# Patient Record
Sex: Female | Born: 1970 | Race: White | Hispanic: No | Smoking: Current every day smoker
Health system: Southern US, Community
[De-identification: ages and names within clinical notes are randomized; demographics above are authoritative.]

## PROBLEM LIST (undated history)

## (undated) DIAGNOSIS — E119 Type 2 diabetes mellitus without complications: Secondary | ICD-10-CM

## (undated) DIAGNOSIS — F419 Anxiety disorder, unspecified: Secondary | ICD-10-CM

## (undated) DIAGNOSIS — T4145XA Adverse effect of unspecified anesthetic, initial encounter: Secondary | ICD-10-CM

## (undated) DIAGNOSIS — I639 Cerebral infarction, unspecified: Secondary | ICD-10-CM

## (undated) DIAGNOSIS — K219 Gastro-esophageal reflux disease without esophagitis: Secondary | ICD-10-CM

## (undated) DIAGNOSIS — T8859XA Other complications of anesthesia, initial encounter: Secondary | ICD-10-CM

## (undated) DIAGNOSIS — Z8719 Personal history of other diseases of the digestive system: Secondary | ICD-10-CM

## (undated) DIAGNOSIS — K8020 Calculus of gallbladder without cholecystitis without obstruction: Secondary | ICD-10-CM

## (undated) DIAGNOSIS — G43909 Migraine, unspecified, not intractable, without status migrainosus: Secondary | ICD-10-CM

## (undated) DIAGNOSIS — D649 Anemia, unspecified: Secondary | ICD-10-CM

## (undated) DIAGNOSIS — G4730 Sleep apnea, unspecified: Secondary | ICD-10-CM

## (undated) DIAGNOSIS — Z8711 Personal history of peptic ulcer disease: Secondary | ICD-10-CM

## (undated) HISTORY — PX: DILATION AND CURETTAGE OF UTERUS: SHX78

## (undated) HISTORY — PX: ABDOMINAL EXPLORATION SURGERY: SHX538

---

## 1996-06-04 HISTORY — PX: LEEP: SHX91

## 1998-05-04 HISTORY — PX: TUBAL LIGATION: SHX77

## 1998-08-03 HISTORY — PX: ABDOMINAL HYSTERECTOMY: SHX81

## 2004-04-22 ENCOUNTER — Emergency Department: Payer: Self-pay | Admitting: General Practice

## 2006-01-22 ENCOUNTER — Ambulatory Visit: Payer: Self-pay | Admitting: Nurse Practitioner

## 2006-07-16 ENCOUNTER — Emergency Department (HOSPITAL_COMMUNITY): Admission: EM | Admit: 2006-07-16 | Discharge: 2006-07-16 | Payer: Self-pay | Admitting: Emergency Medicine

## 2006-08-12 ENCOUNTER — Ambulatory Visit: Payer: Self-pay | Admitting: Nurse Practitioner

## 2006-10-23 ENCOUNTER — Emergency Department (HOSPITAL_COMMUNITY): Admission: EM | Admit: 2006-10-23 | Discharge: 2006-10-23 | Payer: Self-pay | Admitting: Emergency Medicine

## 2006-12-16 ENCOUNTER — Emergency Department (HOSPITAL_COMMUNITY): Admission: EM | Admit: 2006-12-16 | Discharge: 2006-12-16 | Payer: Self-pay | Admitting: Emergency Medicine

## 2007-03-18 ENCOUNTER — Ambulatory Visit: Payer: Self-pay | Admitting: Unknown Physician Specialty

## 2007-03-20 ENCOUNTER — Ambulatory Visit: Payer: Self-pay | Admitting: Unknown Physician Specialty

## 2007-05-06 ENCOUNTER — Emergency Department (HOSPITAL_COMMUNITY): Admission: EM | Admit: 2007-05-06 | Discharge: 2007-05-06 | Payer: Self-pay | Admitting: Emergency Medicine

## 2007-05-12 ENCOUNTER — Ambulatory Visit: Payer: Self-pay | Admitting: *Deleted

## 2007-12-15 ENCOUNTER — Emergency Department (HOSPITAL_COMMUNITY): Admission: EM | Admit: 2007-12-15 | Discharge: 2007-12-15 | Payer: Self-pay | Admitting: Emergency Medicine

## 2009-01-16 ENCOUNTER — Emergency Department (HOSPITAL_COMMUNITY): Admission: EM | Admit: 2009-01-16 | Discharge: 2009-01-16 | Payer: Self-pay | Admitting: Emergency Medicine

## 2009-02-11 ENCOUNTER — Emergency Department (HOSPITAL_COMMUNITY): Admission: EM | Admit: 2009-02-11 | Discharge: 2009-02-11 | Payer: Self-pay | Admitting: Family Medicine

## 2009-04-06 ENCOUNTER — Emergency Department (HOSPITAL_COMMUNITY): Admission: EM | Admit: 2009-04-06 | Discharge: 2009-04-06 | Payer: Self-pay | Admitting: Family Medicine

## 2009-07-14 ENCOUNTER — Emergency Department (HOSPITAL_COMMUNITY): Admission: EM | Admit: 2009-07-14 | Discharge: 2009-07-14 | Payer: Self-pay | Admitting: Emergency Medicine

## 2009-07-29 ENCOUNTER — Emergency Department (HOSPITAL_COMMUNITY): Admission: EM | Admit: 2009-07-29 | Discharge: 2009-07-29 | Payer: Self-pay | Admitting: Emergency Medicine

## 2009-07-31 ENCOUNTER — Emergency Department (HOSPITAL_COMMUNITY): Admission: EM | Admit: 2009-07-31 | Discharge: 2009-07-31 | Payer: Self-pay | Admitting: Family Medicine

## 2009-09-20 ENCOUNTER — Emergency Department (HOSPITAL_COMMUNITY): Admission: EM | Admit: 2009-09-20 | Discharge: 2009-09-20 | Payer: Self-pay | Admitting: Family Medicine

## 2009-11-06 ENCOUNTER — Emergency Department (HOSPITAL_COMMUNITY): Admission: EM | Admit: 2009-11-06 | Discharge: 2009-11-06 | Payer: Self-pay | Admitting: Emergency Medicine

## 2010-04-19 ENCOUNTER — Emergency Department (HOSPITAL_COMMUNITY): Admission: EM | Admit: 2010-04-19 | Discharge: 2010-04-19 | Payer: Self-pay | Admitting: Emergency Medicine

## 2010-06-08 ENCOUNTER — Emergency Department (HOSPITAL_COMMUNITY)
Admission: EM | Admit: 2010-06-08 | Discharge: 2010-06-08 | Payer: Self-pay | Source: Home / Self Care | Admitting: Family Medicine

## 2010-06-08 LAB — POCT URINALYSIS DIPSTICK
Bilirubin Urine: NEGATIVE
Hemoglobin, Urine: NEGATIVE
Ketones, ur: NEGATIVE mg/dL
Nitrite: NEGATIVE
Protein, ur: NEGATIVE mg/dL
Specific Gravity, Urine: 1.015 (ref 1.005–1.030)
Urine Glucose, Fasting: >=1000 mg/dL — AB
Urobilinogen, UA: 0.2 mg/dL (ref 0.0–1.0)
pH: 5 (ref 5.0–8.0)

## 2010-06-08 LAB — WET PREP, GENITAL
Clue Cells Wet Prep HPF POC: NONE SEEN
Trich, Wet Prep: NONE SEEN
Yeast Wet Prep HPF POC: NONE SEEN

## 2010-06-08 LAB — GLUCOSE, CAPILLARY: Glucose-Capillary: 339 mg/dL — ABNORMAL HIGH (ref 70–99)

## 2010-06-09 LAB — GC/CHLAMYDIA PROBE AMP, GENITAL
Chlamydia, DNA Probe: NEGATIVE
GC Probe Amp, Genital: NEGATIVE

## 2010-07-16 ENCOUNTER — Inpatient Hospital Stay (INDEPENDENT_AMBULATORY_CARE_PROVIDER_SITE_OTHER)
Admission: RE | Admit: 2010-07-16 | Discharge: 2010-07-16 | Disposition: A | Discharge status: Home / Self Care | Payer: Self-pay | Source: Ambulatory Visit | Attending: Family Medicine | Admitting: Family Medicine

## 2010-07-16 DIAGNOSIS — J069 Acute upper respiratory infection, unspecified: Secondary | ICD-10-CM

## 2010-07-16 LAB — GLUCOSE, CAPILLARY: Glucose-Capillary: 320 mg/dL — ABNORMAL HIGH (ref 70–99)

## 2010-07-21 ENCOUNTER — Inpatient Hospital Stay (INDEPENDENT_AMBULATORY_CARE_PROVIDER_SITE_OTHER)
Admission: RE | Admit: 2010-07-21 | Discharge: 2010-07-21 | Disposition: A | Discharge status: Home / Self Care | Payer: Self-pay | Source: Ambulatory Visit

## 2010-07-21 DIAGNOSIS — L600 Ingrowing nail: Secondary | ICD-10-CM

## 2010-08-15 LAB — URINALYSIS, ROUTINE W REFLEX MICROSCOPIC
Glucose, UA: 500 mg/dL — AB
Hgb urine dipstick: NEGATIVE
Ketones, ur: 15 mg/dL — AB
Nitrite: NEGATIVE
Protein, ur: NEGATIVE mg/dL
Specific Gravity, Urine: 1.029 (ref 1.005–1.030)
Urobilinogen, UA: 0.2 mg/dL (ref 0.0–1.0)
pH: 6 (ref 5.0–8.0)

## 2010-08-15 LAB — COMPREHENSIVE METABOLIC PANEL
ALT: 19 U/L (ref 0–35)
AST: 17 U/L (ref 0–37)
Albumin: 3.9 g/dL (ref 3.5–5.2)
Alkaline Phosphatase: 65 U/L (ref 39–117)
BUN: 9 mg/dL (ref 6–23)
CO2: 27 mEq/L (ref 19–32)
Calcium: 9.8 mg/dL (ref 8.4–10.5)
Chloride: 101 mEq/L (ref 96–112)
Creatinine, Ser: 0.84 mg/dL (ref 0.4–1.2)
GFR calc Af Amer: >60 mL/min (ref >60)
GFR calc non Af Amer: >60 mL/min (ref >60)
Glucose, Bld: 197 mg/dL — ABNORMAL HIGH (ref 70–99)
Potassium: 3.9 mEq/L (ref 3.5–5.1)
Sodium: 137 mEq/L (ref 135–145)
Total Bilirubin: 0.3 mg/dL (ref 0.3–1.2)
Total Protein: 6.7 g/dL (ref 6.0–8.3)

## 2010-08-15 LAB — DIFFERENTIAL
Basophils Absolute: 0 10*3/uL (ref 0.0–0.1)
Basophils Relative: 0 % (ref 0–1)
Eosinophils Absolute: 0.3 10*3/uL (ref 0.0–0.7)
Eosinophils Relative: 3 % (ref 0–5)
Lymphocytes Relative: 28 % (ref 12–46)
Lymphs Abs: 2.6 10*3/uL (ref 0.7–4.0)
Monocytes Absolute: 0.5 10*3/uL (ref 0.1–1.0)
Monocytes Relative: 5 % (ref 3–12)
Neutro Abs: 6.1 10*3/uL (ref 1.7–7.7)
Neutrophils Relative %: 64 % (ref 43–77)

## 2010-08-15 LAB — CBC
HCT: 40.3 % (ref 36.0–46.0)
Hemoglobin: 14.4 g/dL (ref 12.0–15.0)
MCH: 31.9 pg (ref 26.0–34.0)
MCHC: 35.7 g/dL (ref 30.0–36.0)
MCV: 89.2 fL (ref 78.0–100.0)
Platelets: 332 10*3/uL (ref 150–400)
RBC: 4.52 MIL/uL (ref 3.87–5.11)
RDW: 13.1 % (ref 11.5–15.5)
WBC: 9.5 10*3/uL (ref 4.0–10.5)

## 2010-08-21 LAB — DIFFERENTIAL
Basophils Absolute: 0.1 10*3/uL (ref 0.0–0.1)
Basophils Relative: 0 % (ref 0–1)
Eosinophils Absolute: 0.2 10*3/uL (ref 0.0–0.7)
Eosinophils Relative: 1 % (ref 0–5)
Lymphocytes Relative: 9 % — ABNORMAL LOW (ref 12–46)
Lymphs Abs: 1.7 10*3/uL (ref 0.7–4.0)
Monocytes Absolute: 0.5 10*3/uL (ref 0.1–1.0)
Monocytes Relative: 3 % (ref 3–12)
Neutro Abs: 15.9 10*3/uL — ABNORMAL HIGH (ref 1.7–7.7)
Neutrophils Relative %: 87 % — ABNORMAL HIGH (ref 43–77)

## 2010-08-21 LAB — CBC
HCT: 46.1 % — ABNORMAL HIGH (ref 36.0–46.0)
Hemoglobin: 16.1 g/dL — ABNORMAL HIGH (ref 12.0–15.0)
MCHC: 35 g/dL (ref 30.0–36.0)
MCV: 93.2 fL (ref 78.0–100.0)
Platelets: 49 10*3/uL — ABNORMAL LOW (ref 150–400)
RBC: 4.94 MIL/uL (ref 3.87–5.11)
RDW: 13.9 % (ref 11.5–15.5)
WBC: 18.3 10*3/uL — ABNORMAL HIGH (ref 4.0–10.5)

## 2010-08-21 LAB — COMPREHENSIVE METABOLIC PANEL
ALT: 30 U/L (ref 0–35)
AST: 46 U/L — ABNORMAL HIGH (ref 0–37)
Albumin: 4.4 g/dL (ref 3.5–5.2)
Alkaline Phosphatase: 69 U/L (ref 39–117)
BUN: 11 mg/dL (ref 6–23)
CO2: 20 mEq/L (ref 19–32)
Calcium: 8.7 mg/dL (ref 8.4–10.5)
Chloride: 102 mEq/L (ref 96–112)
Creatinine, Ser: 0.69 mg/dL (ref 0.4–1.2)
GFR calc Af Amer: >60 mL/min (ref >60)
GFR calc non Af Amer: >60 mL/min (ref >60)
Glucose, Bld: 209 mg/dL — ABNORMAL HIGH (ref 70–99)
Potassium: 4.6 mEq/L (ref 3.5–5.1)
Sodium: 132 mEq/L — ABNORMAL LOW (ref 135–145)
Total Bilirubin: 2.1 mg/dL — ABNORMAL HIGH (ref 0.3–1.2)
Total Protein: 7 g/dL (ref 6.0–8.3)

## 2010-08-21 LAB — URINE MICROSCOPIC-ADD ON

## 2010-08-21 LAB — LIPASE, BLOOD: Lipase: 22 U/L (ref 11–59)

## 2010-08-21 LAB — URINALYSIS, ROUTINE W REFLEX MICROSCOPIC
Glucose, UA: NEGATIVE mg/dL
Hgb urine dipstick: NEGATIVE
Ketones, ur: NEGATIVE mg/dL
Leukocytes, UA: NEGATIVE
Nitrite: NEGATIVE
Protein, ur: 30 mg/dL — AB
Specific Gravity, Urine: 1.027 (ref 1.005–1.030)
Urobilinogen, UA: 0.2 mg/dL (ref 0.0–1.0)
pH: 5 (ref 5.0–8.0)

## 2010-08-23 LAB — CBC
HCT: 41 % (ref 36.0–46.0)
Hemoglobin: 13.9 g/dL (ref 12.0–15.0)
MCHC: 34 g/dL (ref 30.0–36.0)
MCV: 93.3 fL (ref 78.0–100.0)
Platelets: 285 10*3/uL (ref 150–400)
RBC: 4.39 MIL/uL (ref 3.87–5.11)
RDW: 14.5 % (ref 11.5–15.5)
WBC: 8.1 10*3/uL (ref 4.0–10.5)

## 2010-08-23 LAB — PREGNANCY, URINE: Preg Test, Ur: NEGATIVE

## 2010-08-23 LAB — POCT I-STAT, CHEM 8
BUN: 9 mg/dL (ref 6–23)
Calcium, Ion: 1.05 mmol/L — ABNORMAL LOW (ref 1.12–1.32)
Chloride: 105 mEq/L (ref 96–112)
Creatinine, Ser: <0.2 mg/dL — ABNORMAL LOW (ref 0.4–1.2)
Glucose, Bld: 281 mg/dL — ABNORMAL HIGH (ref 70–99)
HCT: 42 % (ref 36.0–46.0)
Hemoglobin: 14.3 g/dL (ref 12.0–15.0)
Potassium: 4 mEq/L (ref 3.5–5.1)
Sodium: 135 mEq/L (ref 135–145)
TCO2: 23 mmol/L (ref 0–100)

## 2010-08-23 LAB — DIFFERENTIAL
Basophils Absolute: 0 10*3/uL (ref 0.0–0.1)
Basophils Relative: 0 % (ref 0–1)
Lymphocytes Relative: 21 % (ref 12–46)
Monocytes Relative: 6 % (ref 3–12)
Neutro Abs: 5.7 10*3/uL (ref 1.7–7.7)
Neutrophils Relative %: 70 % (ref 43–77)

## 2010-08-23 LAB — HEMOCCULT GUIAC POC 1CARD (OFFICE): Fecal Occult Bld: POSITIVE

## 2010-09-02 ENCOUNTER — Inpatient Hospital Stay (INDEPENDENT_AMBULATORY_CARE_PROVIDER_SITE_OTHER)
Admission: RE | Admit: 2010-09-02 | Discharge: 2010-09-02 | Disposition: A | Discharge status: Home / Self Care | Payer: Self-pay | Source: Ambulatory Visit | Attending: Emergency Medicine | Admitting: Emergency Medicine

## 2010-09-02 DIAGNOSIS — R05 Cough: Secondary | ICD-10-CM

## 2010-09-02 DIAGNOSIS — J019 Acute sinusitis, unspecified: Secondary | ICD-10-CM

## 2010-09-02 DIAGNOSIS — R059 Cough, unspecified: Secondary | ICD-10-CM

## 2010-09-08 LAB — POCT URINALYSIS DIP (DEVICE)
Glucose, UA: NEGATIVE mg/dL
Hgb urine dipstick: NEGATIVE
Nitrite: NEGATIVE

## 2010-09-26 ENCOUNTER — Emergency Department (HOSPITAL_COMMUNITY): Payer: Self-pay

## 2010-09-26 ENCOUNTER — Emergency Department (HOSPITAL_COMMUNITY)
Admission: EM | Admit: 2010-09-26 | Discharge: 2010-09-26 | Disposition: A | Discharge status: Home / Self Care | Payer: Self-pay | Attending: Emergency Medicine | Admitting: Emergency Medicine

## 2010-09-26 DIAGNOSIS — K8020 Calculus of gallbladder without cholecystitis without obstruction: Secondary | ICD-10-CM | POA: Insufficient documentation

## 2010-09-26 DIAGNOSIS — R630 Anorexia: Secondary | ICD-10-CM | POA: Insufficient documentation

## 2010-09-26 DIAGNOSIS — E7800 Pure hypercholesterolemia, unspecified: Secondary | ICD-10-CM | POA: Insufficient documentation

## 2010-09-26 DIAGNOSIS — G8929 Other chronic pain: Secondary | ICD-10-CM | POA: Insufficient documentation

## 2010-09-26 DIAGNOSIS — M549 Dorsalgia, unspecified: Secondary | ICD-10-CM | POA: Insufficient documentation

## 2010-09-26 DIAGNOSIS — Z79899 Other long term (current) drug therapy: Secondary | ICD-10-CM | POA: Insufficient documentation

## 2010-09-26 DIAGNOSIS — E119 Type 2 diabetes mellitus without complications: Secondary | ICD-10-CM | POA: Insufficient documentation

## 2010-09-26 DIAGNOSIS — R1013 Epigastric pain: Secondary | ICD-10-CM | POA: Insufficient documentation

## 2010-09-26 DIAGNOSIS — Z794 Long term (current) use of insulin: Secondary | ICD-10-CM | POA: Insufficient documentation

## 2010-09-26 DIAGNOSIS — R110 Nausea: Secondary | ICD-10-CM | POA: Insufficient documentation

## 2010-09-26 LAB — DIFFERENTIAL
Basophils Absolute: 0.1 10*3/uL (ref 0.0–0.1)
Basophils Relative: 1 % (ref 0–1)
Neutro Abs: 6.5 10*3/uL (ref 1.7–7.7)
Neutrophils Relative %: 66 % (ref 43–77)

## 2010-09-26 LAB — COMPREHENSIVE METABOLIC PANEL
Alkaline Phosphatase: 66 U/L (ref 39–117)
BUN: 8 mg/dL (ref 6–23)
GFR calc non Af Amer: >60 mL/min (ref >60)
Glucose, Bld: 317 mg/dL — ABNORMAL HIGH (ref 70–99)
Potassium: 4 mEq/L (ref 3.5–5.1)
Total Protein: 6.2 g/dL (ref 6.0–8.3)

## 2010-09-26 LAB — URINALYSIS, ROUTINE W REFLEX MICROSCOPIC
Glucose, UA: >1000 mg/dL — AB
Leukocytes, UA: NEGATIVE
Protein, ur: NEGATIVE mg/dL
pH: 5 (ref 5.0–8.0)

## 2010-09-26 LAB — CBC
Hemoglobin: 15.2 g/dL — ABNORMAL HIGH (ref 12.0–15.0)
RBC: 4.71 MIL/uL (ref 3.87–5.11)

## 2010-09-26 LAB — URINE MICROSCOPIC-ADD ON

## 2010-09-26 LAB — GLUCOSE, CAPILLARY: Glucose-Capillary: 233 mg/dL — ABNORMAL HIGH (ref 70–99)

## 2010-09-26 LAB — LIPASE, BLOOD: Lipase: 36 U/L (ref 11–59)

## 2010-12-12 ENCOUNTER — Emergency Department (HOSPITAL_COMMUNITY)
Admission: EM | Admit: 2010-12-12 | Discharge: 2010-12-12 | Disposition: A | Discharge status: Home / Self Care | Payer: Self-pay | Attending: Emergency Medicine | Admitting: Emergency Medicine

## 2010-12-12 DIAGNOSIS — N9489 Other specified conditions associated with female genital organs and menstrual cycle: Secondary | ICD-10-CM | POA: Insufficient documentation

## 2010-12-12 DIAGNOSIS — N764 Abscess of vulva: Secondary | ICD-10-CM | POA: Insufficient documentation

## 2010-12-12 DIAGNOSIS — E119 Type 2 diabetes mellitus without complications: Secondary | ICD-10-CM | POA: Insufficient documentation

## 2010-12-12 DIAGNOSIS — E7800 Pure hypercholesterolemia, unspecified: Secondary | ICD-10-CM | POA: Insufficient documentation

## 2010-12-12 DIAGNOSIS — N949 Unspecified condition associated with female genital organs and menstrual cycle: Secondary | ICD-10-CM | POA: Insufficient documentation

## 2010-12-12 DIAGNOSIS — Z794 Long term (current) use of insulin: Secondary | ICD-10-CM | POA: Insufficient documentation

## 2010-12-12 DIAGNOSIS — Z79899 Other long term (current) drug therapy: Secondary | ICD-10-CM | POA: Insufficient documentation

## 2010-12-12 DIAGNOSIS — M549 Dorsalgia, unspecified: Secondary | ICD-10-CM | POA: Insufficient documentation

## 2010-12-12 DIAGNOSIS — G8929 Other chronic pain: Secondary | ICD-10-CM | POA: Insufficient documentation

## 2010-12-14 LAB — CULTURE, ROUTINE-ABSCESS

## 2011-03-18 ENCOUNTER — Emergency Department (HOSPITAL_COMMUNITY): Payer: Self-pay

## 2011-03-18 ENCOUNTER — Emergency Department (HOSPITAL_COMMUNITY)
Admission: EM | Admit: 2011-03-18 | Discharge: 2011-03-18 | Disposition: A | Discharge status: Home / Self Care | Payer: Self-pay | Attending: Emergency Medicine | Admitting: Emergency Medicine

## 2011-03-18 DIAGNOSIS — F411 Generalized anxiety disorder: Secondary | ICD-10-CM | POA: Insufficient documentation

## 2011-03-18 DIAGNOSIS — F17200 Nicotine dependence, unspecified, uncomplicated: Secondary | ICD-10-CM | POA: Insufficient documentation

## 2011-03-18 DIAGNOSIS — R6883 Chills (without fever): Secondary | ICD-10-CM | POA: Insufficient documentation

## 2011-03-18 DIAGNOSIS — R197 Diarrhea, unspecified: Secondary | ICD-10-CM | POA: Insufficient documentation

## 2011-03-18 DIAGNOSIS — E119 Type 2 diabetes mellitus without complications: Secondary | ICD-10-CM | POA: Insufficient documentation

## 2011-03-18 DIAGNOSIS — K921 Melena: Secondary | ICD-10-CM | POA: Insufficient documentation

## 2011-03-18 DIAGNOSIS — R062 Wheezing: Secondary | ICD-10-CM | POA: Insufficient documentation

## 2011-03-18 DIAGNOSIS — E789 Disorder of lipoprotein metabolism, unspecified: Secondary | ICD-10-CM | POA: Insufficient documentation

## 2011-03-18 DIAGNOSIS — Z79899 Other long term (current) drug therapy: Secondary | ICD-10-CM | POA: Insufficient documentation

## 2011-03-18 DIAGNOSIS — Z794 Long term (current) use of insulin: Secondary | ICD-10-CM | POA: Insufficient documentation

## 2011-03-18 DIAGNOSIS — R112 Nausea with vomiting, unspecified: Secondary | ICD-10-CM | POA: Insufficient documentation

## 2011-03-18 DIAGNOSIS — R61 Generalized hyperhidrosis: Secondary | ICD-10-CM | POA: Insufficient documentation

## 2011-03-18 DIAGNOSIS — R109 Unspecified abdominal pain: Secondary | ICD-10-CM | POA: Insufficient documentation

## 2011-03-18 LAB — DIFFERENTIAL
Basophils Relative: 0 % (ref 0–1)
Lymphocytes Relative: 16 % (ref 12–46)
Lymphs Abs: 2 10*3/uL (ref 0.7–4.0)
Monocytes Absolute: 0.8 10*3/uL (ref 0.1–1.0)
Monocytes Relative: 6 % (ref 3–12)
Neutro Abs: 9.3 10*3/uL — ABNORMAL HIGH (ref 1.7–7.7)

## 2011-03-18 LAB — CBC
HCT: 46.8 % — ABNORMAL HIGH (ref 36.0–46.0)
Hemoglobin: 17 g/dL — ABNORMAL HIGH (ref 12.0–15.0)
MCH: 32.6 pg (ref 26.0–34.0)
MCHC: 36.3 g/dL — ABNORMAL HIGH (ref 30.0–36.0)
MCV: 89.7 fL (ref 78.0–100.0)

## 2011-03-18 LAB — URINALYSIS, ROUTINE W REFLEX MICROSCOPIC
Bilirubin Urine: NEGATIVE
Glucose, UA: 500 mg/dL — AB
Hgb urine dipstick: NEGATIVE
Ketones, ur: NEGATIVE mg/dL
Protein, ur: NEGATIVE mg/dL
pH: 5 (ref 5.0–8.0)

## 2011-03-18 LAB — COMPREHENSIVE METABOLIC PANEL
CO2: 24 mEq/L (ref 19–32)
Calcium: 9.9 mg/dL (ref 8.4–10.5)
Chloride: 98 mEq/L (ref 96–112)
Creatinine, Ser: 0.65 mg/dL (ref 0.50–1.10)
GFR calc Af Amer: >90 mL/min (ref >90)
GFR calc non Af Amer: >90 mL/min (ref >90)
Glucose, Bld: 222 mg/dL — ABNORMAL HIGH (ref 70–99)
Total Bilirubin: 0.6 mg/dL (ref 0.3–1.2)

## 2011-03-18 LAB — GLUCOSE, CAPILLARY: Glucose-Capillary: 170 mg/dL — ABNORMAL HIGH (ref 70–99)

## 2011-03-18 LAB — OCCULT BLOOD, POC DEVICE: Fecal Occult Bld: POSITIVE

## 2011-03-18 MED ORDER — IOHEXOL 300 MG/ML  SOLN
80.0000 mL | Freq: Once | INTRAMUSCULAR | Status: AC | PRN
  Administered 2011-03-18: 80 mL via INTRAVENOUS

## 2011-03-20 LAB — CBC
HCT: 47.1 — ABNORMAL HIGH
Hemoglobin: 16.1 — ABNORMAL HIGH
MCHC: 34.2
Platelets: 411 — ABNORMAL HIGH
RDW: 13.2

## 2011-03-20 LAB — COMPREHENSIVE METABOLIC PANEL
Albumin: 4.9
BUN: 10
Creatinine, Ser: 0.75
Total Bilirubin: 1.1
Total Protein: 8.4 — ABNORMAL HIGH

## 2011-03-20 LAB — DIFFERENTIAL
Basophils Absolute: 0
Lymphocytes Relative: 16
Monocytes Absolute: 0.7
Monocytes Relative: 5
Neutro Abs: 10.6 — ABNORMAL HIGH
Neutrophils Relative %: 76

## 2011-03-20 LAB — URINALYSIS, ROUTINE W REFLEX MICROSCOPIC
Hgb urine dipstick: NEGATIVE
Specific Gravity, Urine: 1.009
Urobilinogen, UA: 0.2

## 2012-01-03 ENCOUNTER — Encounter (HOSPITAL_COMMUNITY): Payer: Self-pay | Admitting: General Practice

## 2012-01-03 ENCOUNTER — Ambulatory Visit (HOSPITAL_COMMUNITY)
Admission: EM | Admit: 2012-01-03 | Discharge: 2012-01-04 | Disposition: A | Discharge status: Home / Self Care | Payer: Managed Care, Other (non HMO) | Attending: General Surgery | Admitting: General Surgery

## 2012-01-03 ENCOUNTER — Inpatient Hospital Stay: Admit: 2012-01-03 | Payer: Self-pay | Admitting: General Surgery

## 2012-01-03 ENCOUNTER — Encounter (HOSPITAL_COMMUNITY)
Admission: EM | Disposition: A | Discharge status: Home / Self Care | Payer: Self-pay | Source: Home / Self Care | Attending: General Surgery

## 2012-01-03 ENCOUNTER — Encounter (HOSPITAL_COMMUNITY): Payer: Self-pay | Admitting: *Deleted

## 2012-01-03 ENCOUNTER — Emergency Department (HOSPITAL_COMMUNITY): Payer: Managed Care, Other (non HMO) | Admitting: Anesthesiology

## 2012-01-03 ENCOUNTER — Emergency Department (HOSPITAL_COMMUNITY): Payer: Managed Care, Other (non HMO)

## 2012-01-03 ENCOUNTER — Encounter (HOSPITAL_COMMUNITY): Payer: Self-pay | Admitting: Anesthesiology

## 2012-01-03 DIAGNOSIS — K3580 Unspecified acute appendicitis: Secondary | ICD-10-CM

## 2012-01-03 DIAGNOSIS — E876 Hypokalemia: Secondary | ICD-10-CM

## 2012-01-03 DIAGNOSIS — F411 Generalized anxiety disorder: Secondary | ICD-10-CM | POA: Insufficient documentation

## 2012-01-03 DIAGNOSIS — E119 Type 2 diabetes mellitus without complications: Secondary | ICD-10-CM | POA: Insufficient documentation

## 2012-01-03 DIAGNOSIS — F17200 Nicotine dependence, unspecified, uncomplicated: Secondary | ICD-10-CM | POA: Insufficient documentation

## 2012-01-03 DIAGNOSIS — K8020 Calculus of gallbladder without cholecystitis without obstruction: Secondary | ICD-10-CM

## 2012-01-03 HISTORY — DX: Personal history of other diseases of the digestive system: Z87.19

## 2012-01-03 HISTORY — DX: Gastro-esophageal reflux disease without esophagitis: K21.9

## 2012-01-03 HISTORY — DX: Anemia, unspecified: D64.9

## 2012-01-03 HISTORY — DX: Personal history of peptic ulcer disease: Z87.11

## 2012-01-03 HISTORY — DX: Adverse effect of unspecified anesthetic, initial encounter: T41.45XA

## 2012-01-03 HISTORY — DX: Calculus of gallbladder without cholecystitis without obstruction: K80.20

## 2012-01-03 HISTORY — DX: Other complications of anesthesia, initial encounter: T88.59XA

## 2012-01-03 HISTORY — DX: Migraine, unspecified, not intractable, without status migrainosus: G43.909

## 2012-01-03 HISTORY — DX: Sleep apnea, unspecified: G47.30

## 2012-01-03 HISTORY — DX: Type 2 diabetes mellitus without complications: E11.9

## 2012-01-03 HISTORY — PX: LAPAROSCOPIC APPENDECTOMY: SHX408

## 2012-01-03 HISTORY — DX: Anxiety disorder, unspecified: F41.9

## 2012-01-03 HISTORY — PX: APPENDECTOMY: SHX54

## 2012-01-03 LAB — CBC WITH DIFFERENTIAL/PLATELET
Basophils Absolute: 0.1 10*3/uL (ref 0.0–0.1)
Eosinophils Relative: 2 % (ref 0–5)
Lymphocytes Relative: 9 % — ABNORMAL LOW (ref 12–46)
Lymphs Abs: 2 10*3/uL (ref 0.7–4.0)
MCV: 90.5 fL (ref 78.0–100.0)
Neutro Abs: 19.5 10*3/uL — ABNORMAL HIGH (ref 1.7–7.7)
Neutrophils Relative %: 84 % — ABNORMAL HIGH (ref 43–77)
Platelets: 389 10*3/uL (ref 150–400)
RBC: 4.65 MIL/uL (ref 3.87–5.11)
RDW: 14 % (ref 11.5–15.5)
WBC: 23.2 10*3/uL — ABNORMAL HIGH (ref 4.0–10.5)

## 2012-01-03 LAB — LIPASE, BLOOD: Lipase: 31 U/L (ref 11–59)

## 2012-01-03 LAB — COMPREHENSIVE METABOLIC PANEL
ALT: 19 U/L (ref 0–35)
AST: 13 U/L (ref 0–37)
Alkaline Phosphatase: 85 U/L (ref 39–117)
CO2: 24 mEq/L (ref 19–32)
Chloride: 96 mEq/L (ref 96–112)
GFR calc Af Amer: >90 mL/min (ref >90)
GFR calc non Af Amer: >90 mL/min (ref >90)
Glucose, Bld: 318 mg/dL — ABNORMAL HIGH (ref 70–99)
Potassium: 3 mEq/L — ABNORMAL LOW (ref 3.5–5.1)
Sodium: 134 mEq/L — ABNORMAL LOW (ref 135–145)
Total Bilirubin: 0.5 mg/dL (ref 0.3–1.2)

## 2012-01-03 LAB — POCT I-STAT, CHEM 8
Calcium, Ion: 1.13 mmol/L (ref 1.12–1.23)
Glucose, Bld: 311 mg/dL — ABNORMAL HIGH (ref 70–99)
HCT: 47 % — ABNORMAL HIGH (ref 36.0–46.0)
TCO2: 21 mmol/L (ref 0–100)

## 2012-01-03 LAB — GLUCOSE, CAPILLARY
Glucose-Capillary: 220 mg/dL — ABNORMAL HIGH (ref 70–99)
Glucose-Capillary: 170 mg/dL — ABNORMAL HIGH (ref 70–99)

## 2012-01-03 SURGERY — APPENDECTOMY, LAPAROSCOPIC
Anesthesia: General | Wound class: Contaminated

## 2012-01-03 MED ORDER — POTASSIUM CHLORIDE IN NACL 20-0.9 MEQ/L-% IV SOLN
INTRAVENOUS | Status: DC
  Administered 2012-01-03: 23:00:00 via INTRAVENOUS
  Filled 2012-01-03 (×4): qty 1000

## 2012-01-03 MED ORDER — PROPOFOL 10 MG/ML IV EMUL
INTRAVENOUS | Status: DC | PRN
  Administered 2012-01-03: 250 mg via INTRAVENOUS

## 2012-01-03 MED ORDER — NEOSTIGMINE METHYLSULFATE 1 MG/ML IJ SOLN
INTRAMUSCULAR | Status: DC | PRN
  Administered 2012-01-03: 3 mg via INTRAVENOUS

## 2012-01-03 MED ORDER — ONDANSETRON HCL 4 MG/2ML IJ SOLN
INTRAMUSCULAR | Status: DC | PRN
  Administered 2012-01-03: 4 mg via INTRAVENOUS

## 2012-01-03 MED ORDER — ONDANSETRON HCL 4 MG/2ML IJ SOLN
4.0000 mg | Freq: Once | INTRAMUSCULAR | Status: AC
  Administered 2012-01-03: 4 mg via INTRAVENOUS
  Filled 2012-01-03: qty 2

## 2012-01-03 MED ORDER — SODIUM CHLORIDE 0.9 % IV BOLUS (SEPSIS)
1000.0000 mL | Freq: Once | INTRAVENOUS | Status: AC
  Administered 2012-01-03: 1000 mL via INTRAVENOUS

## 2012-01-03 MED ORDER — IOHEXOL 300 MG/ML  SOLN
80.0000 mL | Freq: Once | INTRAMUSCULAR | Status: AC | PRN
  Administered 2012-01-03: 80 mL via INTRAVENOUS

## 2012-01-03 MED ORDER — ONDANSETRON HCL 4 MG/2ML IJ SOLN: 4.0000 mg | Freq: Once | INTRAMUSCULAR | Status: DC | PRN

## 2012-01-03 MED ORDER — LACTATED RINGERS IV SOLN
INTRAVENOUS | Status: DC | PRN
  Administered 2012-01-03: 14:00:00 via INTRAVENOUS

## 2012-01-03 MED ORDER — HEPARIN SODIUM (PORCINE) 5000 UNIT/ML IJ SOLN
5000.0000 [IU] | Freq: Three times a day (TID) | INTRAMUSCULAR | Status: DC
  Administered 2012-01-03 – 2012-01-04 (×2): 5000 [IU] via SUBCUTANEOUS
  Filled 2012-01-03 (×5): qty 1

## 2012-01-03 MED ORDER — MORPHINE SULFATE 4 MG/ML IJ SOLN
4.0000 mg | Freq: Once | INTRAMUSCULAR | Status: AC
  Administered 2012-01-03: 4 mg via INTRAVENOUS
  Filled 2012-01-03: qty 1

## 2012-01-03 MED ORDER — FENTANYL CITRATE 0.05 MG/ML IJ SOLN
INTRAMUSCULAR | Status: DC | PRN
  Administered 2012-01-03: 50 ug via INTRAVENOUS
  Administered 2012-01-03 (×2): 150 ug via INTRAVENOUS
  Administered 2012-01-03: 50 ug via INTRAVENOUS

## 2012-01-03 MED ORDER — HYDROMORPHONE HCL PF 1 MG/ML IJ SOLN
0.2500 mg | INTRAMUSCULAR | Status: DC | PRN
  Administered 2012-01-03 (×2): 0.5 mg via INTRAVENOUS

## 2012-01-03 MED ORDER — MORPHINE SULFATE 2 MG/ML IJ SOLN
2.0000 mg | INTRAMUSCULAR | Status: DC | PRN
  Administered 2012-01-03: 2 mg via INTRAVENOUS
  Administered 2012-01-03 – 2012-01-04 (×3): 4 mg via INTRAVENOUS
  Filled 2012-01-03: qty 1
  Filled 2012-01-03 (×3): qty 2

## 2012-01-03 MED ORDER — SODIUM CHLORIDE 0.9 % IV SOLN
1.0000 g | INTRAVENOUS | Status: DC
  Filled 2012-01-03 (×2): qty 1

## 2012-01-03 MED ORDER — INSULIN ASPART 100 UNIT/ML ~~LOC~~ SOLN
0.0000 [IU] | Freq: Three times a day (TID) | SUBCUTANEOUS | Status: DC
  Administered 2012-01-03: 5 [IU] via SUBCUTANEOUS
  Administered 2012-01-04: 8 [IU] via SUBCUTANEOUS

## 2012-01-03 MED ORDER — SODIUM CHLORIDE 0.9 % IR SOLN
Status: DC | PRN
  Administered 2012-01-03: 1000 mL

## 2012-01-03 MED ORDER — GLYCOPYRROLATE 0.2 MG/ML IJ SOLN
INTRAMUSCULAR | Status: DC | PRN
  Administered 2012-01-03: 0.4 mg via INTRAVENOUS

## 2012-01-03 MED ORDER — PHENYLEPHRINE HCL 10 MG/ML IJ SOLN
10.0000 mg | INTRAVENOUS | Status: DC | PRN
  Administered 2012-01-03: 20 ug/min via INTRAVENOUS

## 2012-01-03 MED ORDER — BUPIVACAINE-EPINEPHRINE 0.5% -1:200000 IJ SOLN
INTRAMUSCULAR | Status: DC | PRN
  Administered 2012-01-03: 16 mL

## 2012-01-03 MED ORDER — OXYMETAZOLINE HCL 0.05 % NA SOLN
2.0000 | Freq: Two times a day (BID) | NASAL | Status: DC | PRN
  Filled 2012-01-03: qty 15

## 2012-01-03 MED ORDER — PIOGLITAZONE HCL 30 MG PO TABS
30.0000 mg | ORAL_TABLET | Freq: Every day | ORAL | Status: DC
  Administered 2012-01-04: 30 mg via ORAL
  Filled 2012-01-03 (×2): qty 1

## 2012-01-03 MED ORDER — MIDAZOLAM HCL 5 MG/5ML IJ SOLN
INTRAMUSCULAR | Status: DC | PRN
  Administered 2012-01-03: 2 mg via INTRAVENOUS

## 2012-01-03 MED ORDER — SODIUM CHLORIDE 0.9 % IV SOLN
1.0000 g | INTRAVENOUS | Status: DC
  Filled 2012-01-03: qty 1

## 2012-01-03 MED ORDER — 0.9 % SODIUM CHLORIDE (POUR BTL) OPTIME
TOPICAL | Status: DC | PRN
  Administered 2012-01-03: 1000 mL

## 2012-01-03 MED ORDER — HYDROMORPHONE HCL PF 1 MG/ML IJ SOLN
1.0000 mg | Freq: Once | INTRAMUSCULAR | Status: AC
  Administered 2012-01-03: 1 mg via INTRAVENOUS
  Filled 2012-01-03: qty 1

## 2012-01-03 MED ORDER — LIDOCAINE HCL (CARDIAC) 20 MG/ML IV SOLN
INTRAVENOUS | Status: DC | PRN
  Administered 2012-01-03: 100 mg via INTRAVENOUS

## 2012-01-03 MED ORDER — LORAZEPAM 0.5 MG PO TABS: 0.5000 mg | ORAL_TABLET | ORAL | Status: DC | PRN

## 2012-01-03 MED ORDER — POTASSIUM CHLORIDE 10 MEQ/100ML IV SOLN
10.0000 meq | Freq: Once | INTRAVENOUS | Status: AC
  Administered 2012-01-03: 10 meq via INTRAVENOUS
  Filled 2012-01-03: qty 100

## 2012-01-03 MED ORDER — FUROSEMIDE 40 MG PO TABS
40.0000 mg | ORAL_TABLET | Freq: Every day | ORAL | Status: DC
  Administered 2012-01-03 – 2012-01-04 (×2): 40 mg via ORAL
  Filled 2012-01-03 (×2): qty 1

## 2012-01-03 MED ORDER — ROCURONIUM BROMIDE 100 MG/10ML IV SOLN
INTRAVENOUS | Status: DC | PRN
  Administered 2012-01-03: 35 mg via INTRAVENOUS

## 2012-01-03 MED ORDER — SODIUM CHLORIDE 0.9 % IV SOLN
INTRAVENOUS | Status: DC | PRN
  Administered 2012-01-03: 15:00:00 via INTRAVENOUS

## 2012-01-03 MED ORDER — OXYCODONE-ACETAMINOPHEN 5-325 MG PO TABS: 1.0000 | ORAL_TABLET | ORAL | Status: DC | PRN

## 2012-01-03 SURGICAL SUPPLY — 42 items
ADH SKN CLS APL DERMABOND .7 (GAUZE/BANDAGES/DRESSINGS) ×1
APPLIER CLIP ROT 10 11.4 M/L (STAPLE)
APR CLP MED LRG 11.4X10 (STAPLE)
BAG SPEC RTRVL LRG 6X4 10 (ENDOMECHANICALS) ×1
BLADE SURG ROTATE 9660 (MISCELLANEOUS) IMPLANT
CANISTER SUCTION 2500CC (MISCELLANEOUS) ×2 IMPLANT
CHLORAPREP W/TINT 26ML (MISCELLANEOUS) ×2 IMPLANT
CLIP APPLIE ROT 10 11.4 M/L (STAPLE) IMPLANT
CLOTH BEACON ORANGE TIMEOUT ST (SAFETY) ×2 IMPLANT
COVER SURGICAL LIGHT HANDLE (MISCELLANEOUS) ×2 IMPLANT
CUTTER FLEX LINEAR 45M (STAPLE) ×2 IMPLANT
DERMABOND ADVANCED (GAUZE/BANDAGES/DRESSINGS) ×1
DERMABOND ADVANCED .7 DNX12 (GAUZE/BANDAGES/DRESSINGS) ×1 IMPLANT
DRAPE UTILITY 15X26 W/TAPE STR (DRAPE) ×4 IMPLANT
ELECT REM PT RETURN 9FT ADLT (ELECTROSURGICAL) ×2
ELECTRODE REM PT RTRN 9FT ADLT (ELECTROSURGICAL) ×1 IMPLANT
ENDOLOOP SUT PDS II  0 18 (SUTURE)
ENDOLOOP SUT PDS II 0 18 (SUTURE) IMPLANT
GLOVE BIOGEL PI IND STRL 8 (GLOVE) ×1 IMPLANT
GLOVE BIOGEL PI INDICATOR 8 (GLOVE) ×1
GLOVE SURG SS PI 7.0 STRL IVOR (GLOVE) ×1 IMPLANT
GLOVE SURG SS PI 7.5 STRL IVOR (GLOVE) ×2 IMPLANT
GOWN STRL NON-REIN LRG LVL3 (GOWN DISPOSABLE) ×4 IMPLANT
GOWN STRL REIN XL XLG (GOWN DISPOSABLE) ×2 IMPLANT
KIT BASIN OR (CUSTOM PROCEDURE TRAY) ×2 IMPLANT
KIT ROOM TURNOVER OR (KITS) ×2 IMPLANT
NS IRRIG 1000ML POUR BTL (IV SOLUTION) ×2 IMPLANT
PAD ARMBOARD 7.5X6 YLW CONV (MISCELLANEOUS) ×4 IMPLANT
POUCH SPECIMEN RETRIEVAL 10MM (ENDOMECHANICALS) ×2 IMPLANT
RELOAD STAPLE 45 3.5 BLU ETS (ENDOMECHANICALS) ×1 IMPLANT
RELOAD STAPLE TA45 3.5 REG BLU (ENDOMECHANICALS) ×2 IMPLANT
SCALPEL HARMONIC ACE (MISCELLANEOUS) ×2 IMPLANT
SET IRRIG TUBING LAPAROSCOPIC (IRRIGATION / IRRIGATOR) ×2 IMPLANT
SUT MON AB 5-0 P3 18 (SUTURE) ×2 IMPLANT
TOWEL OR 17X24 6PK STRL BLUE (TOWEL DISPOSABLE) ×2 IMPLANT
TOWEL OR 17X26 10 PK STRL BLUE (TOWEL DISPOSABLE) ×2 IMPLANT
TRAY FOLEY BAG SILVER LF 14FR (CATHETERS) ×1 IMPLANT
TRAY LAPAROSCOPIC (CUSTOM PROCEDURE TRAY) ×2 IMPLANT
TROCAR XCEL BLUNT TIP 100MML (ENDOMECHANICALS) ×2 IMPLANT
TROCAR Z-THREAD FIOS 12X100MM (TROCAR) ×1 IMPLANT
TROCAR Z-THREAD FIOS 5X100MM (TROCAR) ×2 IMPLANT
WATER STERILE IRR 1000ML POUR (IV SOLUTION) IMPLANT

## 2012-01-03 NOTE — Preoperative (Signed)
Beta Blockers   Reason not to administer Beta Blockers:Not Applicable 

## 2012-01-03 NOTE — ED Provider Notes (Signed)
Assumed patient care end of shift. 41 year old female with history of gallstones presents with upper abdominal pain which radiates down to her right lower quadrant. She was found to have an elevated white count of 23. She also has an elevated blood sugar in the high 200s. Patient was getting IV fluid. Her LFTs are normal. Abdominal ultrasound shows evidence of cholelithiasis without evidence of cholecystitis. An abdominal CT scan ordered for further evaluation.  On exam, pt appears drowsy.  Heart RRR, S1S2, lung CTAB, abd soft, RLQ TTP, no rebound tenderness, negative Murphy's sign.   8:54 AM Request additional pain medication.  Is ready for CT scan  10:13 AM WBC 23.2 with left shift.  CT shows evidence of acute suppurative appendicitis. No focal fluid collection or free air to suggest perforation.  Will consult surgery for further care.  Pt made NPO.  I have consult with general surgery, who agrees to see the patient in the ED for further management.  Results for orders placed during the hospital encounter of 01/03/12  CBC WITH DIFFERENTIAL      Component Value Range   WBC 23.2 (*) 4.0 - 10.5 K/uL   RBC 4.65  3.87 - 5.11 MIL/uL   Hemoglobin 15.4 (*) 12.0 - 15.0 g/dL   HCT 41.3  24.4 - 01.0 %   MCV 90.5  78.0 - 100.0 fL   MCH 33.1  26.0 - 34.0 pg   MCHC 36.6 (*) 30.0 - 36.0 g/dL   RDW 27.2  53.6 - 64.4 %   Platelets 389  150 - 400 K/uL   Neutrophils Relative 84 (*) 43 - 77 %   Neutro Abs 19.5 (*) 1.7 - 7.7 K/uL   Lymphocytes Relative 9 (*) 12 - 46 %   Lymphs Abs 2.0  0.7 - 4.0 K/uL   Monocytes Relative 6  3 - 12 %   Monocytes Absolute 1.3 (*) 0.1 - 1.0 K/uL   Eosinophils Relative 2  0 - 5 %   Eosinophils Absolute 0.4  0.0 - 0.7 K/uL   Basophils Relative 0  0 - 1 %   Basophils Absolute 0.1  0.0 - 0.1 K/uL  COMPREHENSIVE METABOLIC PANEL      Component Value Range   Sodium 134 (*) 135 - 145 mEq/L   Potassium 3.0 (*) 3.5 - 5.1 mEq/L   Chloride 96  96 - 112 mEq/L   CO2 24  19 - 32  mEq/L   Glucose, Bld 318 (*) 70 - 99 mg/dL   BUN 9  6 - 23 mg/dL   Creatinine, Ser 0.34  0.50 - 1.10 mg/dL   Calcium 9.2  8.4 - 74.2 mg/dL   Total Protein 7.1  6.0 - 8.3 g/dL   Albumin 4.3  3.5 - 5.2 g/dL   AST 13  0 - 37 U/L   ALT 19  0 - 35 U/L   Alkaline Phosphatase 85  39 - 117 U/L   Total Bilirubin 0.5  0.3 - 1.2 mg/dL   GFR calc non Af Amer >90  >90 mL/min   GFR calc Af Amer >90  >90 mL/min  POCT I-STAT, CHEM 8      Component Value Range   Sodium 137  135 - 145 mEq/L   Potassium 3.3 (*) 3.5 - 5.1 mEq/L   Chloride 100  96 - 112 mEq/L   BUN 8  6 - 23 mg/dL   Creatinine, Ser 5.95  0.50 - 1.10 mg/dL   Glucose, Bld 638 (*)  70 - 99 mg/dL   Calcium, Ion 1.61  0.96 - 1.23 mmol/L   TCO2 21  0 - 100 mmol/L   Hemoglobin 16.0 (*) 12.0 - 15.0 g/dL   HCT 04.5 (*) 40.9 - 81.1 %  LIPASE, BLOOD      Component Value Range   Lipase 31  11 - 59 U/L  GLUCOSE, CAPILLARY      Component Value Range   Glucose-Capillary 293 (*) 70 - 99 mg/dL   US Abdomen Complete  01/03/2012  *RADIOLOGY REPORT*  Clinical Data:  Right upper quadrant pain  COMPLETE ABDOMINAL ULTRASOUND  Comparison:  03/18/2011 CT, 09/26/2010 ultrasound  Findings:  Gallbladder:  Small gallstones and sludge.  No wall thickening or pericholecystic fluid.  Negative sonographic Murphy's sign.  Common bile duct:  Normal caliber, measuring 3 mm.  Liver:  Increased echogenicity suggests fatty infiltration.  This limits sensitivity for focal lesion detection.  IVC:  Appears normal.  Pancreas:  No focal abnormality is identified within the head or proximal body.  The distal body and tail are obscured  Spleen:  Measures 9.6 cm.  No focal abnormality.  Right Kidney:  Measures 12.3 cm.  No hydronephrosis or focal abnormality.  Normal echogenicity.  Left Kidney:  Measures 11.6 cm.  No hydronephrosis or focal abnormality.  Normal echogenicity.  Abdominal aorta:  No aneurysm identified.  IMPRESSION: Cholelithiasis without sonographic evidence for  cholecystitis.  Original Report Authenticated By: Waneta Martins, M.D.   Ct Abdomen Pelvis W Contrast  01/03/2012  *RADIOLOGY REPORT*  Clinical Data: Abdominal pain, emesis  CT ABDOMEN AND PELVIS WITH CONTRAST  Technique:  Multidetector CT imaging of the abdomen and pelvis was performed following the standard protocol during bolus administration of intravenous contrast.  Contrast: 80mL OMNIPAQUE IOHEXOL 300 MG/ML  SOLN  Comparison: Abdominal ultrasound performed earlier today, 01/03/2012, most recent CT scan of the abdomen and pelvis 03/18/2011  Findings:  Lower Chest:  Dependent atelectasis noted in the lower lobes. Otherwise, the visualized lower thorax is unremarkable.  Abdomen: The distal esophagus, stomach and duodenum are within normal limits.  The adrenal glands, kidneys and pancreas are unremarkable.  The liver is diffusely low in attenuation consistent with hepatic steatosis. Layering high attenuation foci within the gallbladder consistent with cholelithiasis as seen on the contemporaneous abdominal ultrasound.  No gallbladder wall thickening, gallbladder distension or pericholecystic fluid.  No intra or extrahepatic biliary duct dilatation.  The spleen is slightly and a measuring 14.3 cm in craniocaudal dimension.  Normal-caliber large and small bowel without evidence of obstruction.  The appendix is enlarged measuring 9 mm in diameter, and the mucosa is hyperenhancing.  There is associated stranding in the periappendiceal fat and thickening of the lateral coronal fascia.  No focal fluid collection, or free air to suggest perforation.  There are a few scattered colonic diverticula without evidence of diverticulitis.  Pelvis: The bladder is distended with urine.  Surgical changes of prior hysterectomy.  There is a 3.4 cm intermediate attenuation structure associated with the right adnexa which has enlarged slightly compared to 2.4 cm on the prior study.  The left ovary is mildly prominent, but  unchanged compared to the prior CT scan. Trace free fluid in the pelvis may be physiologic.  The  Bones: No acute fracture or aggressive appearing lytic or blastic osseous lesion.  Mild changes of degenerative disc disease in the upper lumbar spine.  Vascular: No significant atherosclerotic vascular disease.  IMPRESSION:  1.  Acute suppurative appendicitis.  No  focal fluid collection or free air to suggest perforation.  2. There is a 3.4 cm mildly complex cystic structure associated with the right adnexa.  A similar structure was identified on the prior study from October 2012.  This may represents a new ovarian cyst, or slight interval enlargement of the previous finding and further non emergent imaging evaluation with transvaginal ultrasound is recommended.  3.  Hepatic steatosis  4.  Mild splenomegaly  5.  Bibasilar subsegmental atelectasis  6.  Cholelithiasis  Original Report Authenticated By: HEATH    1. Acute suppurative appendicitis 2. Hyperglycemia 3. Hypokalemia 4. cholelithiasis  Fayrene Helper, PA-C 01/03/12 1030

## 2012-01-03 NOTE — ED Notes (Signed)
OR contacted. Pt will not be going to OR until around 1400

## 2012-01-03 NOTE — Op Note (Signed)
Preoperative Diagnosis: Acute appendicitis [540.9]   Postoprative Diagnosis: Acute appendicitis [540.9]   Procedure: Procedure(s): APPENDECTOMY LAPAROSCOPIC   Surgeon: Glenna Fellows T   Assistants: None  Anesthesia:  General endotracheal anesthesiaDiagnos  Indications:   Patient is a 41 year old female who presents with 18 hours of generalized and more localized right lower quadrant pain. CT scan has shown evidence of significant acute appendicitis and she has localized tenderness with rebound. I recommend proceeding with laparoscopic and possible open appendectomy. We discussed the nature of the surgery, its indications, and risks detailed elsewhere.  Procedure Detail:  Patient is brought to the operating room, placed in the supine position on the operating table, and general endotracheal anesthesia induced. She had been given broad-spectrum IV antibiotics. Foley catheter was placed. The abdomen was widely sterilely prepped and draped in the correct patient and procedure verified. Access was obtained at the umbilicus with a 10 mm open Hassan cannula and pneumoperitoneum established. Under direct vision a 5 mm trocar was placed in the right upper quadrant and a 12 mm trocar in the left lower quadrant. The appendix was in a lateral retrocecal position and was acutely inflamed with exudate but no evidence of gangrene or perforation. The appendix was mobilized by dividing lateral peritoneal attachments and then the tip was able to be identified and grasped and elevated. Further at each end along the cecum were carefully mobilized with blunt dissection and the harmonic scalpel staying away from the bowel wall. The mesoappendix was then sequentially divided with harmonic scalpel until the appendix was freed down to its base which was relatively uninflamed. The appendix was divided across its base with a single firing of the Endo GIA 45 mm blue load stapler. The appendix was placed in an Endo  Catch bag and brought out through the umbilicus. The right lower quadrant internal abdomen was thoroughly irrigated and there was no evidence of bleeding or trocar injury or other problems. All CO2 was evacuated and the mattress suture secured at the umbilicus. Skin incisions were closed with subcuticular Monocryl and Dermabond. Sponge needle and instrument counts were correct.  Findings: Acute appendicitis without perforation or gangrene  Estimated Blood Loss:  Minimal         Drains: none  Blood Given: none          Specimens: appendix        Complications:  * No complications entered in OR log *         Disposition: PACU - hemodynamically stable.         Condition: stable  Mariella Saa MD, FACS  01/03/2012, 3:38 PM

## 2012-01-03 NOTE — H&P (Signed)
Kristie Tapia is an 41 y.o. female.   Chief Complaint: Abdominal pain  HPI: 41 yr old female who presented to [facility] with abdominal pain that started at about 11pm last night.  She relates the pains as feeling like hunger pains at first.  They were mostly in the epigastric region but then progressed to the flanks and RLQ.  She did have chills and thinks she may have been running a fever for her.  She has felt very nauseated but has not vomited.  Workup here was remarkable for her known gallstones but no cholecystitis, significant leukocytosis and CT that is positive for acute appendicitis.  We are called for further evaluation of this.  Past Medical History  Diagnosis Date  . Gall bladder stones   . Diabetes mellitus   . Anxiety     Past Surgical History  Procedure Date  . Abdominal hysterectomy     No family history on file. Social History:  reports that she has been smoking Cigarettes.  She has been smoking about 1 pack per day. She does not have any smokeless tobacco history on file. She reports that she drinks alcohol. She reports that she does not use illicit drugs.  Allergies:  Allergies  Allergen Reactions  . Adhesive (Tape) Other (See Comments)    Blisters USE PAPER TAPE ONLY  . Decongestant (Pseudoephedrine Hcl Er)     ALL DECONGESTANTS  . Latex Itching  . Prednisone     Gives me a bad attitude  . Sulfa Antibiotics     Burns the inside of my mouth     (Not in a hospital admission)  Results for orders placed during the hospital encounter of 01/03/12 (from the past 48 hour(s))  CBC WITH DIFFERENTIAL     Status: Abnormal   Collection Time   01/03/12  3:55 AM      Component Value Range Comment   WBC 23.2 (*) 4.0 - 10.5 K/uL    RBC 4.65  3.87 - 5.11 MIL/uL    Hemoglobin 15.4 (*) 12.0 - 15.0 g/dL    HCT 16.1  09.6 - 04.5 %    MCV 90.5  78.0 - 100.0 fL    MCH 33.1  26.0 - 34.0 pg    MCHC 36.6 (*) 30.0 - 36.0 g/dL    RDW 40.9  81.1 - 91.4 %    Platelets 389  150 - 400  K/uL    Neutrophils Relative 84 (*) 43 - 77 %    Neutro Abs 19.5 (*) 1.7 - 7.7 K/uL    Lymphocytes Relative 9 (*) 12 - 46 %    Lymphs Abs 2.0  0.7 - 4.0 K/uL    Monocytes Relative 6  3 - 12 %    Monocytes Absolute 1.3 (*) 0.1 - 1.0 K/uL    Eosinophils Relative 2  0 - 5 %    Eosinophils Absolute 0.4  0.0 - 0.7 K/uL    Basophils Relative 0  0 - 1 %    Basophils Absolute 0.1  0.0 - 0.1 K/uL   COMPREHENSIVE METABOLIC PANEL     Status: Abnormal   Collection Time   01/03/12  3:55 AM      Component Value Range Comment   Sodium 134 (*) 135 - 145 mEq/L    Potassium 3.0 (*) 3.5 - 5.1 mEq/L    Chloride 96  96 - 112 mEq/L    CO2 24  19 - 32 mEq/L    Glucose, Bld  318 (*) 70 - 99 mg/dL    BUN 9  6 - 23 mg/dL    Creatinine, Ser 1.61  0.50 - 1.10 mg/dL    Calcium 9.2  8.4 - 09.6 mg/dL    Total Protein 7.1  6.0 - 8.3 g/dL    Albumin 4.3  3.5 - 5.2 g/dL    AST 13  0 - 37 U/L    ALT 19  0 - 35 U/L    Alkaline Phosphatase 85  39 - 117 U/L    Total Bilirubin 0.5  0.3 - 1.2 mg/dL    GFR calc non Af Amer >90  >90 mL/min    GFR calc Af Amer >90  >90 mL/min   LIPASE, BLOOD     Status: Normal   Collection Time   01/03/12  3:55 AM      Component Value Range Comment   Lipase 31  11 - 59 U/L   POCT I-STAT, CHEM 8     Status: Abnormal   Collection Time   01/03/12  3:58 AM      Component Value Range Comment   Sodium 137  135 - 145 mEq/L    Potassium 3.3 (*) 3.5 - 5.1 mEq/L    Chloride 100  96 - 112 mEq/L    BUN 8  6 - 23 mg/dL    Creatinine, Ser 0.45  0.50 - 1.10 mg/dL    Glucose, Bld 409 (*) 70 - 99 mg/dL    Calcium, Ion 8.11  9.14 - 1.23 mmol/L    TCO2 21  0 - 100 mmol/L    Hemoglobin 16.0 (*) 12.0 - 15.0 g/dL    HCT 78.2 (*) 95.6 - 46.0 %   GLUCOSE, CAPILLARY     Status: Abnormal   Collection Time   01/03/12  6:27 AM      Component Value Range Comment   Glucose-Capillary 293 (*) 70 - 99 mg/dL    US Abdomen Complete  01/03/2012  *RADIOLOGY REPORT*  Clinical Data:  Right upper quadrant pain   COMPLETE ABDOMINAL ULTRASOUND  Comparison:  03/18/2011 CT, 09/26/2010 ultrasound  Findings:  Gallbladder:  Small gallstones and sludge.  No wall thickening or pericholecystic fluid.  Negative sonographic Murphy's sign.  Common bile duct:  Normal caliber, measuring 3 mm.  Liver:  Increased echogenicity suggests fatty infiltration.  This limits sensitivity for focal lesion detection.  IVC:  Appears normal.  Pancreas:  No focal abnormality is identified within the head or proximal body.  The distal body and tail are obscured  Spleen:  Measures 9.6 cm.  No focal abnormality.  Right Kidney:  Measures 12.3 cm.  No hydronephrosis or focal abnormality.  Normal echogenicity.  Left Kidney:  Measures 11.6 cm.  No hydronephrosis or focal abnormality.  Normal echogenicity.  Abdominal aorta:  No aneurysm identified.  IMPRESSION: Cholelithiasis without sonographic evidence for cholecystitis.  Original Report Authenticated By: Waneta Martins, M.D.   Ct Abdomen Pelvis W Contrast  01/03/2012  *RADIOLOGY REPORT*  Clinical Data: Abdominal pain, emesis  CT ABDOMEN AND PELVIS WITH CONTRAST  Technique:  Multidetector CT imaging of the abdomen and pelvis was performed following the standard protocol during bolus administration of intravenous contrast.  Contrast: 80mL OMNIPAQUE IOHEXOL 300 MG/ML  SOLN  Comparison: Abdominal ultrasound performed earlier today, 01/03/2012, most recent CT scan of the abdomen and pelvis 03/18/2011  Findings:  Lower Chest:  Dependent atelectasis noted in the lower lobes. Otherwise, the visualized lower thorax is unremarkable.  Abdomen:  The distal esophagus, stomach and duodenum are within normal limits.  The adrenal glands, kidneys and pancreas are unremarkable.  The liver is diffusely low in attenuation consistent with hepatic steatosis. Layering high attenuation foci within the gallbladder consistent with cholelithiasis as seen on the contemporaneous abdominal ultrasound.  No gallbladder wall thickening,  gallbladder distension or pericholecystic fluid.  No intra or extrahepatic biliary duct dilatation.  The spleen is slightly and a measuring 14.3 cm in craniocaudal dimension.  Normal-caliber large and small bowel without evidence of obstruction.  The appendix is enlarged measuring 9 mm in diameter, and the mucosa is hyperenhancing.  There is associated stranding in the periappendiceal fat and thickening of the lateral coronal fascia.  No focal fluid collection, or free air to suggest perforation.  There are a few scattered colonic diverticula without evidence of diverticulitis.  Pelvis: The bladder is distended with urine.  Surgical changes of prior hysterectomy.  There is a 3.4 cm intermediate attenuation structure associated with the right adnexa which has enlarged slightly compared to 2.4 cm on the prior study.  The left ovary is mildly prominent, but unchanged compared to the prior CT scan. Trace free fluid in the pelvis may be physiologic.  The  Bones: No acute fracture or aggressive appearing lytic or blastic osseous lesion.  Mild changes of degenerative disc disease in the upper lumbar spine.  Vascular: No significant atherosclerotic vascular disease.  IMPRESSION:  1.  Acute suppurative appendicitis.  No focal fluid collection or free air to suggest perforation.  2. There is a 3.4 cm mildly complex cystic structure associated with the right adnexa.  A similar structure was identified on the prior study from October 2012.  This may represents a new ovarian cyst, or slight interval enlargement of the previous finding and further non emergent imaging evaluation with transvaginal ultrasound is recommended.  3.  Hepatic steatosis  4.  Mild splenomegaly  5.  Bibasilar subsegmental atelectasis  6.  Cholelithiasis  Original Report Authenticated By: HEATH    Review of Systems  Constitutional: Positive for fever and chills. Negative for weight loss.  HENT: Negative.   Eyes: Negative.   Respiratory: Negative.     Cardiovascular: Negative.   Gastrointestinal: Positive for nausea and abdominal pain. Negative for vomiting.  Genitourinary: Negative.   Musculoskeletal: Negative.   Skin: Negative.   Neurological: Negative.   Endo/Heme/Allergies: Negative.   Psychiatric/Behavioral: Negative.     Blood pressure 117/74, pulse 82, temperature 98.3 F (36.8 C), temperature source Oral, resp. rate 22, SpO2 95.00%. Physical Exam  Constitutional: She is oriented to person, place, and time. She appears well-developed and well-nourished. No distress.  HENT:  Head: Normocephalic and atraumatic.  Eyes: EOM are normal. Pupils are equal, round, and reactive to light.  Neck: Normal range of motion. Neck supple.  Cardiovascular: Normal rate and regular rhythm.   Respiratory: Effort normal and breath sounds normal.  GI: Soft. Bowel sounds are normal. There is tenderness. There is guarding.  Genitourinary:       deferred  Musculoskeletal: Normal range of motion. She exhibits no edema.  Neurological: She is alert and oriented to person, place, and time.  Skin: Skin is warm and dry.  Psychiatric: She has a normal mood and affect. Her behavior is normal.     Assessment/Plan 1.  Acute appendicitis: patient will be admitted and started on IV antibiotics, IV fluids and prepared for laparoscopic appendectomy today.  RBA were discussed with the patient who expresses understanding and wishes to  proceed.  WHITE, ELIZABETH 01/03/2012, 10:43 AM

## 2012-01-03 NOTE — ED Notes (Signed)
Patient transported to Ultrasound 

## 2012-01-03 NOTE — ED Notes (Addendum)
Last night around 2300 pt exp acute onset abd "hunger pains".  Pain has increased along with emesis. Hx of gallstones.  Pt took a phenergan tablet at 0115 with no relief.

## 2012-01-03 NOTE — ED Provider Notes (Signed)
Medical screening examination/treatment/procedure(s) were performed by non-physician practitioner and as supervising physician I was immediately available for consultation/collaboration.   Gerhard Munch, MD 01/03/12 [phone]

## 2012-01-03 NOTE — Transfer of Care (Signed)
Immediate Anesthesia Transfer of Care Note  Patient: Kristie Tapia  Procedure(s) Performed: Procedure(s) (LRB): APPENDECTOMY LAPAROSCOPIC (N/A)  Patient Location: PACU  Anesthesia Type: General  Level of Consciousness: awake, alert  and patient cooperative  Airway & Oxygen Therapy: Patient Spontanous Breathing and Patient connected to nasal cannula oxygen  Post-op Assessment: Report given to PACU RN, Post -op Vital signs reviewed and stable and Patient moving all extremities  Post vital signs: Reviewed and stable  Complications: No apparent anesthesia complications

## 2012-01-03 NOTE — Anesthesia Preprocedure Evaluation (Signed)
Anesthesia Evaluation  Patient identified by MRN, date of birth, ID band Patient awake    Reviewed: Allergy & Precautions, H&P , NPO status , Patient's Chart, lab work & pertinent test results  Airway Mallampati: I TM Distance: >3 FB Neck ROM: full    Dental   Pulmonary Current Smoker,          Cardiovascular Rhythm:regular Rate:Normal     Neuro/Psych    GI/Hepatic   Endo/Other  Type 2, Oral Hypoglycemic Agents  Renal/GU      Musculoskeletal   Abdominal   Peds  Hematology   Anesthesia Other Findings   Reproductive/Obstetrics                           Anesthesia Physical Anesthesia Plan  ASA: III  Anesthesia Plan: General   Post-op Pain Management:    Induction: Intravenous  Airway Management Planned: Oral ETT  Additional Equipment:   Intra-op Plan:   Post-operative Plan: Extubation in OR  Informed Consent: I have reviewed the patients History and Physical, chart, labs and discussed the procedure including the risks, benefits and alternatives for the proposed anesthesia with the patient or authorized representative who has indicated his/her understanding and acceptance.     Plan Discussed with: CRNA, Anesthesiologist and Surgeon  Anesthesia Plan Comments:         Anesthesia Quick Evaluation

## 2012-01-03 NOTE — H&P (Signed)
Patient interviewed and examined, agree with PA note above. I recommended proceeding with emergency laparoscopic and possible open appendectomy.  I discussed indications and risks of anesthetic complications, bleeding, infection,organ injury and possible need for open procedure.  She agrees to proceed.  Mariella Saa MD, FACS  01/03/2012 1:25 PM

## 2012-01-03 NOTE — ED Notes (Signed)
MD at bedside. 

## 2012-01-03 NOTE — ED Provider Notes (Signed)
History     CSN: [id number]  Arrival date & time 01/03/12  9629   First MD Initiated Contact with Patient 01/03/12 0345      Chief Complaint  Patient presents with  . Abdominal Pain  . Emesis   HPI  History provided by the patient. Patient is a 41 year old female with history of diabetes, anxiety, gallstones and hysterectomy who presents with complaints of upper abdominal pain and episodes of vomiting that began last night. Patient reports that pain began around 11 PM. Patient states she did not eat much during the day but did have chips around dinnertime at 7 PM. Pain came on acutely and has persistently worsening. Pain began in the epigastrium and left upper quadrant areas. Pain now radiates across upper abdomen and down the right side. She denies pain to the back. Pain was associated with episodes of nausea vomiting. Patient did take a Phenergan tablet that she had left over at home but did not help with nausea. Pain is described as very severe. Patient was diagnosed with having gallstones and sludge in gallbladder most 2 years ago. Since that time she has had very rare occasional biliary colic symptoms. Patient denies any other symptoms at this time.     Past Medical History  Diagnosis Date  . Gall bladder stones   . Diabetes mellitus   . Anxiety     Past Surgical History  Procedure Date  . Abdominal hysterectomy     No family history on file.  History  Substance Use Topics  . Smoking status: Current Everyday Smoker -- 1.0 packs/day    Types: Cigarettes  . Smokeless tobacco: Not on file  . Alcohol Use: Yes     occasional    OB History    Grav Para Term Preterm Abortions TAB SAB Ect Mult Living                  Review of Systems  Constitutional: Negative for fever and chills.  Respiratory: Negative for shortness of breath.   Cardiovascular: Negative for chest pain.  Gastrointestinal: Positive for nausea, vomiting and abdominal pain. Negative for diarrhea and  constipation.  Genitourinary: Negative for dysuria, frequency, hematuria and flank pain.    Allergies  Prednisone and Sulfa antibiotics  Home Medications  No current outpatient prescriptions on file.  BP 116/64  Pulse 77  Temp 98.3 F (36.8 C) (Oral)  Resp 22  SpO2 97%  Physical Exam  Nursing note and vitals reviewed. Constitutional: She is oriented to person, place, and time. She appears well-developed and well-nourished. No distress.  HENT:  Head: Normocephalic.  Cardiovascular: Normal rate and regular rhythm.   Pulmonary/Chest: Effort normal and breath sounds normal.  Abdominal: Soft. Bowel sounds are normal. She exhibits no distension. There is tenderness in the right upper quadrant, right lower quadrant, epigastric area and left upper quadrant. There is no rebound and no guarding.  Neurological: She is alert and oriented to person, place, and time.  Skin: Skin is warm and dry. No erythema.  Psychiatric: She has a normal mood and affect. Her behavior is normal.    ED Course  Procedures   Results for orders placed during the hospital encounter of 01/03/12  CBC WITH DIFFERENTIAL      Component Value Range   WBC 23.2 (*) 4.0 - 10.5 K/uL   RBC 4.65  3.87 - 5.11 MIL/uL   Hemoglobin 15.4 (*) 12.0 - 15.0 g/dL   HCT 52.8  41.3 - 24.4 %  MCV 90.5  78.0 - 100.0 fL   MCH 33.1  26.0 - 34.0 pg   MCHC 36.6 (*) 30.0 - 36.0 g/dL   RDW 45.4  09.8 - 11.9 %   Platelets 389  150 - 400 K/uL   Neutrophils Relative 84 (*) 43 - 77 %   Neutro Abs 19.5 (*) 1.7 - 7.7 K/uL   Lymphocytes Relative 9 (*) 12 - 46 %   Lymphs Abs 2.0  0.7 - 4.0 K/uL   Monocytes Relative 6  3 - 12 %   Monocytes Absolute 1.3 (*) 0.1 - 1.0 K/uL   Eosinophils Relative 2  0 - 5 %   Eosinophils Absolute 0.4  0.0 - 0.7 K/uL   Basophils Relative 0  0 - 1 %   Basophils Absolute 0.1  0.0 - 0.1 K/uL  COMPREHENSIVE METABOLIC PANEL      Component Value Range   Sodium 134 (*) 135 - 145 mEq/L   Potassium 3.0 (*) 3.5 -  5.1 mEq/L   Chloride 96  96 - 112 mEq/L   CO2 24  19 - 32 mEq/L   Glucose, Bld 318 (*) 70 - 99 mg/dL   BUN 9  6 - 23 mg/dL   Creatinine, Ser 1.47  0.50 - 1.10 mg/dL   Calcium 9.2  8.4 - 82.9 mg/dL   Total Protein 7.1  6.0 - 8.3 g/dL   Albumin 4.3  3.5 - 5.2 g/dL   AST 13  0 - 37 U/L   ALT 19  0 - 35 U/L   Alkaline Phosphatase 85  39 - 117 U/L   Total Bilirubin 0.5  0.3 - 1.2 mg/dL   GFR calc non Af Amer >90  >90 mL/min   GFR calc Af Amer >90  >90 mL/min  POCT I-STAT, CHEM 8      Component Value Range   Sodium 137  135 - 145 mEq/L   Potassium 3.3 (*) 3.5 - 5.1 mEq/L   Chloride 100  96 - 112 mEq/L   BUN 8  6 - 23 mg/dL   Creatinine, Ser 5.62  0.50 - 1.10 mg/dL   Glucose, Bld 130 (*) 70 - 99 mg/dL   Calcium, Ion 8.65  7.84 - 1.23 mmol/L   TCO2 21  0 - 100 mmol/L   Hemoglobin 16.0 (*) 12.0 - 15.0 g/dL   HCT 69.6 (*) 29.5 - 28.4 %      No results found.   No diagnosis found.    MDM  4:05 AM patient seen and evaluated. Patient with history of gallstones and gallbladder sludge per her history. Pain began in the epigastric and upper abdomen. Patient still diffusely tender in the upper abdomen some right upper quadrant. Will obtain ultrasound to evaluate gallbladder.   Patient has elevated blood sugar. IV fluids going. 1 mg Dilaudid ordered for pain. 4 mg Zofran also ordered.   Labs show elevated WBC. LFTs normal. Lipase pending. Patient reports occasional alcohol use.   Pt discussed with Fayrene Helper PA-C.  [place] results pending.  Pt with elevated WBC.  May consider CT to r/o appendicitis if no signs for acute cholecystitis.       Angus Seller, [place] 01/03/12 [phone]

## 2012-01-03 NOTE — ED Notes (Signed)
Phone numbers: Boyfriend- Kortnee Bas [phone] Mother- Linton Rump (cell) [phone]                                          (home) [phone]

## 2012-01-03 NOTE — Anesthesia Postprocedure Evaluation (Signed)
  Anesthesia Post-op Note  Patient: Kristie Tapia  Procedure(s) Performed: Procedure(s) (LRB): APPENDECTOMY LAPAROSCOPIC (N/A)  Patient Location: PACU  Anesthesia Type: General  Level of Consciousness: awake, alert , oriented and patient cooperative  Airway and Oxygen Therapy: Patient Spontanous Breathing and Patient connected to nasal cannula oxygen  Post-op Pain: mild  Post-op Assessment: Post-op Vital signs reviewed, Patient's Cardiovascular Status Stable, Respiratory Function Stable, Patent Airway, No signs of Nausea or vomiting and Pain level controlled  Post-op Vital Signs: stable  Complications: No apparent anesthesia complications

## 2012-01-03 NOTE — ED Notes (Signed)
Pt transported to CT scan.

## 2012-01-03 NOTE — ED Notes (Addendum)
Pt is finished drinking contrast. 

## 2012-01-03 NOTE — ED Notes (Signed)
Dr. Johna Sheriff at the bedside.

## 2012-01-03 NOTE — Progress Notes (Signed)
Received from PACU post appendectomy. Alert and oriented, not in any distress, lap sites intact, no drainage. VSS . Will continue to monitor.

## 2012-01-04 LAB — CBC
Hemoglobin: 14.7 g/dL (ref 12.0–15.0)
MCH: 32.2 pg (ref 26.0–34.0)
MCHC: 34.8 g/dL (ref 30.0–36.0)
Platelets: 329 10*3/uL (ref 150–400)
RBC: 4.56 MIL/uL (ref 3.87–5.11)

## 2012-01-04 LAB — BASIC METABOLIC PANEL
Calcium: 8.3 mg/dL — ABNORMAL LOW (ref 8.4–10.5)
GFR calc non Af Amer: >90 mL/min (ref >90)
Potassium: 4.8 mEq/L (ref 3.5–5.1)
Sodium: 133 mEq/L — ABNORMAL LOW (ref 135–145)

## 2012-01-04 LAB — GLUCOSE, CAPILLARY: Glucose-Capillary: 256 mg/dL — ABNORMAL HIGH (ref 70–99)

## 2012-01-04 MED ORDER — HYDROCODONE-ACETAMINOPHEN 5-325 MG PO TABS: 1.0000 | ORAL_TABLET | Freq: Four times a day (QID) | ORAL | Status: AC | PRN

## 2012-01-04 MED ORDER — OXYCODONE-ACETAMINOPHEN 5-325 MG PO TABS
1.0000 | ORAL_TABLET | ORAL | Status: DC | PRN
Start: 2012-01-04 — End: 2012-01-04

## 2012-01-04 NOTE — Discharge Instructions (Signed)
[facility] ______[facility] LAPAROSCOPIC SURGERY: POST OP INSTRUCTIONS Always review your discharge instruction sheet given to you by the facility where your surgery was performed. IF YOU HAVE DISABILITY OR FAMILY LEAVE FORMS, YOU MUST BRING THEM TO THE OFFICE FOR PROCESSING.   DO NOT GIVE THEM TO YOUR DOCTOR.  1. A prescription for pain medication may be given to you upon discharge.  Take your pain medication as prescribed, if needed.  If narcotic pain medicine is not needed, then you may take acetaminophen (Tylenol) or ibuprofen (Advil) as needed. 2. Take your usually prescribed medications unless otherwise directed. 3. If you need a refill on your pain medication, please contact your pharmacy.  They will contact our office to request authorization. Prescriptions will not be filled after 5pm or on week-ends. 4. You should follow a light diet the first few days after arrival home, such as soup and crackers, etc.  Be sure to include lots of fluids daily. 5. Most patients will experience some swelling and bruising in the area of the incisions.  Ice packs will help.  Swelling and bruising can take several days to resolve.  6. It is common to experience some constipation if taking pain medication after surgery.  Increasing fluid intake and taking a stool softener (such as Colace) will usually help or prevent this problem from occurring.  A mild laxative (Milk of Magnesia or Miralax) should be taken according to package instructions if there are no bowel movements after 48 hours. 7. Unless discharge instructions indicate otherwise, you may remove your bandages 24-48 hours after surgery, and you may shower at that time.  You may have steri-strips (small skin tapes) in place directly over the incision.  These strips should be left on the skin for 7-10 days.  If your surgeon used skin glue on the incision, you may shower in 24 hours.  The glue will flake off over the next 2-3 weeks.  Any sutures or  staples will be removed at the office during your follow-up visit. 8. ACTIVITIES:  You may resume regular (light) daily activities beginning the next day--such as daily self-care, walking, climbing stairs--gradually increasing activities as tolerated.  You may have sexual intercourse when it is comfortable.  Refrain from any heavy lifting or straining until approved by your doctor. a. You may drive when you are no longer taking prescription pain medication, you can comfortably wear a seatbelt, and you can safely maneuver your car and apply brakes. b. RETURN TO WORK:  _______1 week for light duty or 2 weeks for full duty______ 9. You should see your doctor in the office for a follow-up appointment approximately 2-3 weeks after your surgery.  Make sure that you call for this appointment within a day or two after you arrive home to insure a convenient appointment time. 10. OTHER INSTRUCTIONS: __________________________________________________________________________________________________________________________ __________________________________________________________________________________________________________________________ WHEN TO CALL YOUR DOCTOR: 1. Fever over 101.0 2. Inability to urinate 3. Continued bleeding from incision. 4. Increased pain, redness, or drainage from the incision. 5. Increasing abdominal pain  The clinic staff is available to answer your questions during regular business hours.  Please don't hesitate to call and ask to speak to one of the nurses for clinical concerns.  If you have a medical emergency, go to the nearest emergency room or call 911.  A surgeon from [facility] is always on call at the hospital. [address], [address], [place], [place]  [address] ? [address] [address], [place], [place]   [address] (  [phone] ? [phone] ? FAX [phone] Web site: [email or web address]   

## 2012-01-04 NOTE — ED Provider Notes (Signed)
Medical screening examination/treatment/procedure(s) were performed by non-physician practitioner and as supervising physician I was immediately available for consultation/collaboration.  Tammye Kahler, MD 01/04/12 0914 

## 2012-01-04 NOTE — Progress Notes (Signed)
Discharge home.

## 2012-01-04 NOTE — Discharge Summary (Signed)
  Physician Discharge Summary  Patient ID: Kristie Tapia MRN: [id number] DOB/AGE: 41-Aug-1972 40 y.o.  Admit date: 01/03/2012 Discharge date: 01/04/2012  Admitting Diagnosis: Acute Appendicitis  Discharge Diagnosis Acute Appendicitis  Consultants None  Procedures Laparoscopic Appendectomy  Hospital Course 41 yr old female who presented to [facility] with abdominal pain.  Workup showed appendicitis.  Patient was admitted and underwent procedure listed above.  Tolerated procedure well and was transferred to the floor.  Diet was advanced as tolerated.  On POD#1, the patient was voiding well, tolerating diet, ambulating well, pain well controlled, vital signs stable, incisions c/d/i and felt stable for discharge home.  Patient will follow up in our office in 2 weeks and knows to call with questions or concerns.  Medication List  As of 01/04/2012  8:55 AM   TAKE these medications         ALEVE 220 MG tablet   Generic drug: naproxen sodium   Take 220 mg by mouth 2 (two) times daily with a meal. Headache and pain      aspirin-acetaminophen-caffeine 250-250-65 MG per tablet   Commonly known as: EXCEDRIN MIGRAINE   Take 1-2 tablets by mouth every 8 (eight) hours as needed. headache      calcium carbonate 750 MG chewable tablet   Commonly known as: TUMS EX   Chew 2-3 tablets by mouth every 4 (four) hours as needed. indigestion      furosemide 40 MG tablet   Commonly known as: LASIX   Take 40 mg by mouth daily.      LORazepam 1 MG tablet   Commonly known as: ATIVAN   Take 0.5-1 mg by mouth every 8 (eight) hours as needed. anxiety      oxyCODONE-acetaminophen 5-325 MG per tablet   Commonly known as: PERCOCET/ROXICET   Take 1-2 tablets by mouth every 4 (four) hours as needed.      oxymetazoline 0.05 % nasal spray   Commonly known as: AFRIN   Place 2 sprays into the nose 2 (two) times daily as needed. For nasal congestion      pioglitazone 30 MG tablet   Commonly known as: ACTOS   Take 30  mg by mouth daily.      promethazine 25 MG tablet   Commonly known as: PHENERGAN   Take 12.5-25 mg by mouth every 6 (six) hours as needed. nausea             Follow-up Information    Call Mariella Saa, MD. (Please call our office to schedule your follow up to see Dr. Johna Sheriff)    Contact information:   [address] [place] [address] [phone]          Signed: Denny Levy [facility] [phone]  01/04/2012, 8:55 AM

## 2012-01-09 ENCOUNTER — Encounter (HOSPITAL_COMMUNITY): Payer: Self-pay | Admitting: General Surgery

## 2012-01-31 ENCOUNTER — Ambulatory Visit (INDEPENDENT_AMBULATORY_CARE_PROVIDER_SITE_OTHER): Payer: Managed Care, Other (non HMO) | Admitting: Surgery

## 2012-01-31 ENCOUNTER — Encounter (INDEPENDENT_AMBULATORY_CARE_PROVIDER_SITE_OTHER): Payer: Self-pay | Admitting: Surgery

## 2012-01-31 VITALS — BP 116/76 | HR 84 | Resp 16 | Ht 64.0 in | Wt 169.0 lb

## 2012-01-31 DIAGNOSIS — Z09 Encounter for follow-up examination after completed treatment for conditions other than malignant neoplasm: Secondary | ICD-10-CM

## 2012-01-31 NOTE — Patient Instructions (Signed)
Observe lump-call if it becomes red and tender like a boil. Followup with Dr. Johna Sheriff next week

## 2012-01-31 NOTE — Progress Notes (Signed)
Kristie Tapia 41 y.o.  Body mass index is 29.01 kg/(m^2).  There is no problem list on file for this patient.   Allergies  Allergen Reactions  . Adhesive (Tape) Other (See Comments)    Blisters USE PAPER TAPE ONLY  . Sulfa Antibiotics Other (See Comments)    Burns the inside of my mouth; "blisters; leaves tongue and inside of mouth solid red"  . Decongestant (Pseudoephedrine Hcl Er) Itching    ALL DECONGESTANTS  . Prednisone Other (See Comments)    Gives me a bad attitude  . Latex Itching    Past Surgical History  Procedure Date  . Appendectomy 01/03/2012  . Abdominal hysterectomy 08/1998  . Dilation and curettage of uterus   . Abdominal exploration surgery     "I've had several"  . Tubal ligation 05/1998  . Leep 1998    "lungs collapsed"  . Laparoscopic appendectomy 01/03/2012    Procedure: APPENDECTOMY LAPAROSCOPIC;  Surgeon: Mariella Saa, MD;  Location: [facility];  Service: General;  Laterality: N/A;   Provider Not In System 1. S/P appendectomy, follow-up exam     Patient of Dr. Johna Sheriff.  Had lap appy August 2.  2 days ago she developed a knot after she was pushing her truck which does not have a working reverse gear. She was pushing it with her legs and back and that's when she noted the soreness and the lump. It is about 1 cm in diameter slightly to the right of the midline in the umbilical incision. It could be that she had a little tear and bleed. It doesn't swell with Valsalva so it's unlikely the hernia. I told her what to look for in case he gets infected presently does not appear infected, put on antibiotics. However I will get her back to see Dr. Johna Sheriff in one week in followup. Matt B. Daphine Deutscher, MD, [facility] [phone] beeper [phone]  01/31/2012 10:47 AM

## 2012-02-08 ENCOUNTER — Encounter (INDEPENDENT_AMBULATORY_CARE_PROVIDER_SITE_OTHER): Payer: Managed Care, Other (non HMO) | Admitting: General Surgery

## 2012-02-19 ENCOUNTER — Encounter (INDEPENDENT_AMBULATORY_CARE_PROVIDER_SITE_OTHER): Payer: Self-pay | Admitting: General Surgery

## 2013-01-20 ENCOUNTER — Encounter (HOSPITAL_COMMUNITY): Payer: Self-pay | Admitting: Emergency Medicine

## 2013-01-20 ENCOUNTER — Emergency Department (INDEPENDENT_AMBULATORY_CARE_PROVIDER_SITE_OTHER)
Admission: EM | Admit: 2013-01-20 | Discharge: 2013-01-20 | Disposition: A | Discharge status: Home / Self Care | Payer: Managed Care, Other (non HMO) | Source: Home / Self Care

## 2013-01-20 DIAGNOSIS — K529 Noninfective gastroenteritis and colitis, unspecified: Secondary | ICD-10-CM

## 2013-01-20 DIAGNOSIS — K5289 Other specified noninfective gastroenteritis and colitis: Secondary | ICD-10-CM

## 2013-01-20 LAB — POCT URINALYSIS DIP (DEVICE)
Protein, ur: NEGATIVE mg/dL
Specific Gravity, Urine: 1.015 (ref 1.005–1.030)
Urobilinogen, UA: 0.2 mg/dL (ref 0.0–1.0)
pH: 6 (ref 5.0–8.0)

## 2013-01-20 LAB — POCT I-STAT, CHEM 8
Calcium, Ion: 1.18 mmol/L (ref 1.12–1.23)
Chloride: 102 mEq/L (ref 96–112)
Creatinine, Ser: 0.7 mg/dL (ref 0.50–1.10)
Glucose, Bld: 152 mg/dL — ABNORMAL HIGH (ref 70–99)
HCT: 49 % — ABNORMAL HIGH (ref 36.0–46.0)

## 2013-01-20 MED ORDER — ONDANSETRON HCL 4 MG PO TABS: 4.0000 mg | ORAL_TABLET | Freq: Four times a day (QID) | ORAL | Status: DC

## 2013-01-20 MED ORDER — PROMETHAZINE HCL 25 MG PO TABS: 12.5000 mg | ORAL_TABLET | Freq: Four times a day (QID) | ORAL | Status: DC | PRN

## 2013-01-20 NOTE — ED Notes (Signed)
Pt given Sprite and graham crackers

## 2013-01-20 NOTE — ED Provider Notes (Signed)
Kristie Tapia is a 42 y.o. female who presents to Urgent Care today for chest and abdominal pain starting yesterday morning. The pain is located on the right side flank and back and does not radiate It is associated with nausea vomiting and diarrhea. Is also associated with a mild fever. She additionally notes some chest pain with palpitations but denies any shortness of breath . The pain does not radiate and is not worse with exertion. She has a pertinent medical history significant for gallstones however this current pain is not consistent with prior episodes of gallstone pain.    PMH reviewed.  gallbladder disease, type 2 diabetes, history of stomach ulcers  History  Substance Use Topics  . Smoking status: Current Every Day Smoker -- 1.00 packs/day for 29 years    Types: Cigarettes  . Smokeless tobacco: Never Used  . Alcohol Use: Yes     Comment: 01/03/2012 "6pk would last me a year"   ROS as above Medications reviewed. No current facility-administered medications for this encounter.   Current Outpatient Prescriptions  Medication Sig Dispense Refill  . escitalopram (LEXAPRO) 10 MG tablet       . LORazepam (ATIVAN) 1 MG tablet Take 0.5-1 mg by mouth every 8 (eight) hours as needed. anxiety      . pioglitazone (ACTOS) 30 MG tablet Take 30 mg by mouth daily.      Marland Kitchen aspirin-acetaminophen-caffeine (EXCEDRIN MIGRAINE) 250-250-65 MG per tablet Take 1-2 tablets by mouth every 8 (eight) hours as needed. headache      . calcium carbonate (TUMS EX) 750 MG chewable tablet Chew 2-3 tablets by mouth every 4 (four) hours as needed. indigestion      . furosemide (LASIX) 40 MG tablet Take 40 mg by mouth daily.      . naproxen sodium (ALEVE) 220 MG tablet Take 220 mg by mouth 2 (two) times daily with a meal. Headache and pain      . ondansetron (ZOFRAN) 4 MG tablet Take 1 tablet (4 mg total) by mouth every 6 (six) hours.  12 tablet  0  . oxymetazoline (AFRIN) 0.05 % nasal spray Place 2 sprays into the nose 2  (two) times daily as needed. For nasal congestion      . promethazine (PHENERGAN) 25 MG tablet Take 0.5-1 tablets (12.5-25 mg total) by mouth every 6 (six) hours as needed. nausea  20 tablet  0    Exam:  BP 137/89  Pulse 72  Temp(Src) 98 F (36.7 C) (Oral)  Resp 20  SpO2 96% Gen: Well NAD HEENT: EOMI,  MMM Lungs: CTABL Nl WOB Chest wall tender to palpation  Heart: RRR no MRG Abd: NABS,  not distended. Diffusely tender. No rebound or guarding. No CV angle tenderness to percussion Exts: Non edematous BL  LE, warm and well perfused.   Results for orders placed during the hospital encounter of 01/20/13 (from the past 24 hour(s))  POCT I-STAT, CHEM 8     Status: Abnormal   Collection Time    01/20/13  1:28 PM      Result Value Range   Sodium 136  135 - 145 mEq/L   Potassium 4.2  3.5 - 5.1 mEq/L   Chloride 102  96 - 112 mEq/L   BUN 11  6 - 23 mg/dL   Creatinine, Ser 2.95  0.50 - 1.10 mg/dL   Glucose, Bld 621 (*) 70 - 99 mg/dL   Calcium, Ion 3.08  6.57 - 1.23 mmol/L   TCO2 24  0 - 100 mmol/L   Hemoglobin 16.7 (*) 12.0 - 15.0 g/dL   HCT 56.2 (*) 13.0 - 86.5 %  POCT URINALYSIS DIP (DEVICE)     Status: None   Collection Time    01/20/13  1:56 PM      Result Value Range   Glucose, UA NEGATIVE  NEGATIVE mg/dL   Bilirubin Urine NEGATIVE  NEGATIVE   Ketones, ur NEGATIVE  NEGATIVE mg/dL   Specific Gravity, Urine 1.015  1.005 - 1.030   Hgb urine dipstick NEGATIVE  NEGATIVE   pH 6.0  5.0 - 8.0   Protein, ur NEGATIVE  NEGATIVE mg/dL   Urobilinogen, UA 0.2  0.0 - 1.0 mg/dL   Nitrite NEGATIVE  NEGATIVE   Leukocytes, UA NEGATIVE  NEGATIVE   No results found.  Lead EKG shows normal sinus rhythm at 70 beats per minute. Normal EKG  Assessment and Plan: 43 y.o. female with likely viral gastroenteritis.  Plan to treat with antinausea medication and watchful waiting.  F/u her or ED if not improving.  Discussed warning signs or symptoms. Please see discharge instructions. Patient  expresses understanding.      Rodolph Bong, MD 01/20/13 [phone]

## 2013-01-20 NOTE — ED Notes (Signed)
Pt c/o chest pain that radiates to the back onset yest Sxs also include: f/v/n/d Denies: SOB Hx of anxiety... States it feels like an anxiety attack Alert w/no signs of acute distress.

## 2013-10-11 ENCOUNTER — Emergency Department (HOSPITAL_COMMUNITY)
Admission: EM | Admit: 2013-10-11 | Discharge: 2013-10-11 | Disposition: A | Discharge status: Home / Self Care | Payer: BC Managed Care – PPO | Attending: Emergency Medicine | Admitting: Emergency Medicine

## 2013-10-11 ENCOUNTER — Encounter (HOSPITAL_COMMUNITY): Payer: Self-pay | Admitting: Emergency Medicine

## 2013-10-11 DIAGNOSIS — E119 Type 2 diabetes mellitus without complications: Secondary | ICD-10-CM | POA: Insufficient documentation

## 2013-10-11 DIAGNOSIS — R109 Unspecified abdominal pain: Secondary | ICD-10-CM | POA: Insufficient documentation

## 2013-10-11 DIAGNOSIS — Z79899 Other long term (current) drug therapy: Secondary | ICD-10-CM | POA: Insufficient documentation

## 2013-10-11 DIAGNOSIS — Z862 Personal history of diseases of the blood and blood-forming organs and certain disorders involving the immune mechanism: Secondary | ICD-10-CM | POA: Insufficient documentation

## 2013-10-11 DIAGNOSIS — F17200 Nicotine dependence, unspecified, uncomplicated: Secondary | ICD-10-CM | POA: Insufficient documentation

## 2013-10-11 DIAGNOSIS — G43909 Migraine, unspecified, not intractable, without status migrainosus: Secondary | ICD-10-CM | POA: Insufficient documentation

## 2013-10-11 DIAGNOSIS — F411 Generalized anxiety disorder: Secondary | ICD-10-CM | POA: Insufficient documentation

## 2013-10-11 DIAGNOSIS — R42 Dizziness and giddiness: Secondary | ICD-10-CM | POA: Insufficient documentation

## 2013-10-11 DIAGNOSIS — K219 Gastro-esophageal reflux disease without esophagitis: Secondary | ICD-10-CM | POA: Insufficient documentation

## 2013-10-11 DIAGNOSIS — Z91040 Latex allergy status: Secondary | ICD-10-CM | POA: Insufficient documentation

## 2013-10-11 MED ORDER — ONDANSETRON HCL 4 MG/2ML IJ SOLN
4.0000 mg | Freq: Once | INTRAMUSCULAR | Status: AC
  Administered 2013-10-11: 4 mg via INTRAVENOUS
  Filled 2013-10-11: qty 2

## 2013-10-11 MED ORDER — PROCHLORPERAZINE MALEATE 10 MG PO TABS: 10.0000 mg | ORAL_TABLET | Freq: Two times a day (BID) | ORAL | Status: DC | PRN

## 2013-10-11 MED ORDER — SODIUM CHLORIDE 0.9 % IV BOLUS (SEPSIS)
1000.0000 mL | Freq: Once | INTRAVENOUS | Status: AC
  Administered 2013-10-11: 1000 mL via INTRAVENOUS

## 2013-10-11 MED ORDER — PROCHLORPERAZINE EDISYLATE 5 MG/ML IJ SOLN
10.0000 mg | Freq: Once | INTRAMUSCULAR | Status: AC
  Administered 2013-10-11: 10 mg via INTRAVENOUS
  Filled 2013-10-11: qty 2

## 2013-10-11 MED ORDER — KETOROLAC TROMETHAMINE 30 MG/ML IJ SOLN
30.0000 mg | Freq: Once | INTRAMUSCULAR | Status: AC
  Administered 2013-10-11: 30 mg via INTRAVENOUS
  Filled 2013-10-11: qty 1

## 2013-10-11 MED ORDER — ONDANSETRON HCL 4 MG PO TABS: 4.0000 mg | ORAL_TABLET | Freq: Four times a day (QID) | ORAL | Status: DC

## 2013-10-11 MED ORDER — VALPROATE SODIUM 500 MG/5ML IV SOLN
500.0000 mg | Freq: Once | INTRAVENOUS | Status: AC
  Administered 2013-10-11: 500 mg via INTRAVENOUS
  Filled 2013-10-11: qty 5

## 2013-10-11 NOTE — ED Notes (Signed)
Pt. Stated, I've had migraine headache since last Tuesday, went to Dr. On Lucia BitterWed. He's gave medication but I still have the headache.N/V , I've taken 2 Phenergan  Without help.

## 2013-10-11 NOTE — ED Provider Notes (Signed)
CSN: [id number]     Arrival date & time 10/11/13  1317 History   First MD Initiated Contact with Patient 10/11/13 1501     Chief Complaint  Patient presents with  . Migraine     (Consider location/radiation/quality/duration/timing/severity/associated sxs/prior Treatment) HPI Comments: Patient is a 43 year old female with history of anxiety, sleep apnear who presents today for migraine. The migraine has been gradually worsening since Thursday evening. The headache feels like a pressure and clamp on her head. It goes from the front of her head to the back. She had associated nausea and lightheadedness. She has mild associated stomach cramps. She describes a visual disturbance of "seeing shadows". These symptoms are all typical for her migraines. She sees Dr. Curt Bears at [facility]. She most recently saw him Wednesday where he gave her IV medications which resolved her headache. She did not call him again because her symptoms worsened Thursday evening and he leaves the office at noon on Fridays. She is able to pull up readings from prior brain MRI's she has had in the past. They read as follows:  MRI brain without contrast 05/2013: Abnormal signal hyperintensity involving the right thalamus, the right superior temporal lobe, and the juxtacortical U fibers of the right frontal lobe. This pattern is concerning for demyelinating disease. Differential diagnosis would include lyme disease or vasculitis.  Migraine is also within the differential. This is less likely to be due to the small vessel occlusive disease.   MRI brain with contrast 05/2013: This is an abnormal brain MRI with contrast: No abnormal enhancement is seen in the previously documented lesions. Unlike the previous study, a sagittal FLAIR imaging sequence was done on the current study and this shows a tract of abnormal signal orient toward the ventricles associated with the right frontal subcortical lesion. 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   Patient is a 43 y.o. female presenting with migraines. The history is provided by the patient. No language interpreter was used.  Migraine Associated symptoms include abdominal pain, headaches and nausea. Pertinent negatives include no chest pain, chills, fever or vomiting.    Past Medical History  Diagnosis Date  . Anxiety   . Complication of anesthesia ` 1998    "lungs collapsed during LEEP procedure"  . Sleep apnea   . Type II diabetes mellitus   . Anemia   . GERD (gastroesophageal reflux disease)   . H/O hiatal hernia   . History of stomach ulcers   . Migraines   . Gallstones    Past Surgical History  Procedure Laterality Date  . Appendectomy  01/03/2012  . Abdominal hysterectomy  08/1998  . Dilation and curettage of uterus    . Abdominal exploration surgery      "I've had several"  . Tubal ligation  05/1998  . Leep  1998    "lungs collapsed"  . Laparoscopic appendectomy  01/03/2012    Procedure: APPENDECTOMY LAPAROSCOPIC;  Surgeon: Mariella Saa, MD;  Location: [facility] OR;  Service: General;  Laterality: N/A;   Family History  Problem Relation Age of Onset  . Breast cancer     History  Substance Use Topics  . Smoking status: Current Every Day Smoker -- 1.00 packs/day for 29 years    Types: Cigarettes  . Smokeless tobacco: Never Used  . Alcohol Use: Yes     Comment: 01/03/2012 "6pk would last me a year"   OB History  Grav Para Term Preterm Abortions TAB SAB Ect Mult Living                 Review of Systems  Constitutional: Negative for fever and chills.  Eyes: Positive for visual disturbance (shadowing).  Respiratory: Negative for shortness of breath.   Cardiovascular: Negative for chest pain.  Gastrointestinal: Positive for nausea and abdominal pain. Negative for vomiting.  Neurological: Positive for light-headedness and headaches.  All other systems reviewed and are  negative.     Allergies  Adhesive; Sulfa antibiotics; Decongestant; Prednisone; and Latex  Home Medications   Prior to Admission medications   Medication Sig Start Date End Date Taking? Authorizing Provider  albuterol (PROVENTIL HFA;VENTOLIN HFA) 108 (90 BASE) MCG/ACT inhaler Inhale 2 puffs into the lungs every 4 (four) hours as needed for wheezing or shortness of breath.   Yes Historical Provider, MD  aspirin-acetaminophen-caffeine (EXCEDRIN MIGRAINE) 4233335272250-250-65 MG per tablet Take 1-2 tablets by mouth every 8 (eight) hours as needed. headache   Yes Historical Provider, MD  BYDUREON 2 MG PEN Inject 2 mg into the skin every 7 (seven) days. 09/16/13  Yes Historical Provider, MD  escitalopram (LEXAPRO) 20 MG tablet Take 20 mg by mouth at bedtime.   Yes Historical Provider, MD  furosemide (LASIX) 40 MG tablet Take 40 mg by mouth daily.   Yes Historical Provider, MD  LORazepam (ATIVAN) 1 MG tablet Take 0.5-1 mg by mouth every 8 (eight) hours as needed. anxiety   Yes Historical Provider, MD  Melatonin 5 MG TABS Take 15 mg by mouth at bedtime.   Yes Historical Provider, MD  Multiple Vitamins-Minerals (MULTIVITAMIN PO) Take 1 tablet by mouth daily.   Yes Historical Provider, MD  naproxen sodium (ALEVE) 220 MG tablet Take 220 mg by mouth 2 (two) times daily as needed (headache/pain).    Yes Historical Provider, MD  omega-3 acid ethyl esters (LOVAZA) 1 G capsule Take 2,000 g by mouth 2 (two) times daily. 10/05/13  Yes Historical Provider, MD  promethazine (PHENERGAN) 25 MG tablet Take 0.5-1 tablets (12.5-25 mg total) by mouth every 6 (six) hours as needed. nausea 01/20/13  Yes Rodolph BongEvan S Corey, MD  tiZANidine (ZANAFLEX) 4 MG capsule Take 4 mg by mouth daily as needed for muscle spasms.   Yes Historical Provider, MD  calcium carbonate (TUMS EX) 750 MG chewable tablet Chew 2-3 tablets by mouth every 4 (four) hours as needed. indigestion    Historical Provider, MD   BP 142/91  Pulse 72  Temp(Src) 97.8 F  (36.6 C) (Oral)  Resp 20  Ht 5\' 5"  (1.651 m)  Wt 190 lb (86.183 kg)  BMI 31.62 kg/m2  SpO2 99% Physical Exam  Nursing note and vitals reviewed. Constitutional: She is oriented to person, place, and time. She appears well-developed and well-nourished. No distress.  HENT:  Head: Normocephalic and atraumatic.  Right Ear: External ear normal.  Left Ear: External ear normal.  Nose: Nose normal.  Mouth/Throat: Oropharynx is clear and moist.  No temporal artery tenderness  Eyes: Conjunctivae and EOM are normal. Pupils are equal, round, and reactive to light.  Neck: Normal range of motion.  No nuchal rigidity or meningeal signs  Cardiovascular: Normal rate, regular rhythm, normal heart sounds, intact distal pulses and normal pulses.   Pulses:      Radial pulses are 2+ on the right side, and 2+ on the left side.       Posterior tibial pulses are 2+ on the right side, and 2+  on the left side.  Pulmonary/Chest: Effort normal and breath sounds normal. No stridor. No respiratory distress. She has no wheezes. She has no rales.  Abdominal: Soft. She exhibits no distension. There is no tenderness.  Musculoskeletal: Normal range of motion.  Moves all extremities without guarding  Neurological: She is alert and oriented to person, place, and time. She has normal strength. Coordination and gait normal.  Finger nose finger normal. Rapid alternating movements normal. No pronator drift. No facial droop. Normal gait. Grip strength 5/5 bilaterally.   Skin: Skin is warm and dry. She is not diaphoretic. No erythema.  Psychiatric: She has a normal mood and affect. Her behavior is normal.    ED Course  Procedures (including critical care time) Labs Review Labs Reviewed - No data to display  Imaging Review No results found.   EKG Interpretation None      MDM   Final diagnoses:  Migraine   Pt HA treated and improved while in ED.  Presentation is like pts typical HA and non concerning for [facility],  ICH, Meningitis, or temporal arteritis. Pt is afebrile with no focal neuro deficits, nuchal rigidity. Pt is to follow up with neurologist. Pt verbalizes understanding and is agreeable with plan to dc. Discussed case with Dr. Gwendolyn GrantWalden who agrees with plan. Patient / Family / Caregiver informed of clinical course, understand medical decision-making process, and agree with plan.    Mora BellmanHannah S Angas Isabell, PA-C 10/14/13 [phone]

## 2013-10-11 NOTE — Discharge Instructions (Signed)
Please call your neurologist tomorrow to let him know you were seen in the Emergency Department and schedule a follow up appointment. Return with new or worsening symptoms.   Migraine Headache A migraine headache is an intense, throbbing pain on one or both sides of your head. A migraine can last for 30 minutes to several hours. CAUSES  The exact cause of a migraine headache is not always known. However, a migraine may be caused when nerves in the brain become irritated and release chemicals that cause inflammation. This causes pain. Certain things may also trigger migraines, such as:  Alcohol.  Smoking.  Stress.  Menstruation.  Aged cheeses.  Foods or drinks that contain nitrates, glutamate, aspartame, or tyramine.  Lack of sleep.  Chocolate.  Caffeine.  Hunger.  Physical exertion.  Fatigue.  Medicines used to treat chest pain (nitroglycerine), birth control pills, estrogen, and some blood pressure medicines. SIGNS AND SYMPTOMS  Pain on one or both sides of your head.  Pulsating or throbbing pain.  Severe pain that prevents daily activities.  Pain that is aggravated by any physical activity.  Nausea, vomiting, or both.  Dizziness.  Pain with exposure to bright lights, loud noises, or activity.  General sensitivity to bright lights, loud noises, or smells. Before you get a migraine, you may get warning signs that a migraine is coming (aura). An aura may include:  Seeing flashing lights.  Seeing bright spots, halos, or zig-zag lines.  Having tunnel vision or blurred vision.  Having feelings of numbness or tingling.  Having trouble talking.  Having muscle weakness. DIAGNOSIS  A migraine headache is often diagnosed based on:  Symptoms.  Physical exam.  A CT scan or MRI of your head. These imaging tests cannot diagnose migraines, but they can help rule out other causes of headaches. TREATMENT Medicines may be given for pain and nausea. Medicines can  also be given to help prevent recurrent migraines.  HOME CARE INSTRUCTIONS  Only take over-the-counter or prescription medicines for pain or discomfort as directed by your health care provider. The use of long-term narcotics is not recommended.  Lie down in a dark, quiet room when you have a migraine.  Keep a journal to find out what may trigger your migraine headaches. For example, write down:  What you eat and drink.  How much sleep you get.  Any change to your diet or medicines.  Limit alcohol consumption.  Quit smoking if you smoke.  Get 7 9 hours of sleep, or as recommended by your health care provider.  Limit stress.  Keep lights dim if bright lights bother you and make your migraines worse. SEEK IMMEDIATE MEDICAL CARE IF:   Your migraine becomes severe.  You have a fever.  You have a stiff neck.  You have vision loss.  You have muscular weakness or loss of muscle control.  You start losing your balance or have trouble walking.  You feel faint or pass out.  You have severe symptoms that are different from your first symptoms. MAKE SURE YOU:   Understand these instructions.  Will watch your condition.  Will get help right away if you are not doing well or get worse. Document Released: 05/21/2005 Document Revised: 03/11/2013 Document Reviewed: 01/26/2013 [facility] Patient Information 2014 [place], [place].

## 2013-10-15 NOTE — ED Provider Notes (Signed)
Medical screening examination/treatment/procedure(s) were performed by non-physician practitioner and as supervising physician I was immediately available for consultation/collaboration.   EKG Interpretation None        Dagmar HaitWilliam Lakendrick Paradis, MD 10/15/13 1758

## 2014-01-11 ENCOUNTER — Encounter (HOSPITAL_COMMUNITY): Payer: Self-pay | Admitting: Emergency Medicine

## 2014-01-11 ENCOUNTER — Emergency Department (HOSPITAL_COMMUNITY)
Admission: EM | Admit: 2014-01-11 | Discharge: 2014-01-11 | Disposition: A | Discharge status: Home / Self Care | Payer: BC Managed Care – PPO | Attending: Emergency Medicine | Admitting: Emergency Medicine

## 2014-01-11 DIAGNOSIS — Z862 Personal history of diseases of the blood and blood-forming organs and certain disorders involving the immune mechanism: Secondary | ICD-10-CM | POA: Diagnosis not present

## 2014-01-11 DIAGNOSIS — Z792 Long term (current) use of antibiotics: Secondary | ICD-10-CM | POA: Insufficient documentation

## 2014-01-11 DIAGNOSIS — Z91040 Latex allergy status: Secondary | ICD-10-CM | POA: Insufficient documentation

## 2014-01-11 DIAGNOSIS — K089 Disorder of teeth and supporting structures, unspecified: Secondary | ICD-10-CM | POA: Insufficient documentation

## 2014-01-11 DIAGNOSIS — E119 Type 2 diabetes mellitus without complications: Secondary | ICD-10-CM | POA: Diagnosis not present

## 2014-01-11 DIAGNOSIS — Z8709 Personal history of other diseases of the respiratory system: Secondary | ICD-10-CM | POA: Insufficient documentation

## 2014-01-11 DIAGNOSIS — K0889 Other specified disorders of teeth and supporting structures: Secondary | ICD-10-CM

## 2014-01-11 DIAGNOSIS — K006 Disturbances in tooth eruption: Secondary | ICD-10-CM | POA: Insufficient documentation

## 2014-01-11 DIAGNOSIS — G43909 Migraine, unspecified, not intractable, without status migrainosus: Secondary | ICD-10-CM | POA: Diagnosis not present

## 2014-01-11 DIAGNOSIS — Z79899 Other long term (current) drug therapy: Secondary | ICD-10-CM | POA: Insufficient documentation

## 2014-01-11 DIAGNOSIS — K029 Dental caries, unspecified: Secondary | ICD-10-CM

## 2014-01-11 DIAGNOSIS — R110 Nausea: Secondary | ICD-10-CM | POA: Insufficient documentation

## 2014-01-11 DIAGNOSIS — F17200 Nicotine dependence, unspecified, uncomplicated: Secondary | ICD-10-CM | POA: Diagnosis not present

## 2014-01-11 DIAGNOSIS — F411 Generalized anxiety disorder: Secondary | ICD-10-CM | POA: Insufficient documentation

## 2014-01-11 MED ORDER — AMOXICILLIN 500 MG PO CAPS: 500.0000 mg | ORAL_CAPSULE | Freq: Three times a day (TID) | ORAL | Status: DC

## 2014-01-11 MED ORDER — HYDROCODONE-ACETAMINOPHEN 5-325 MG PO TABS: 1.0000 | ORAL_TABLET | Freq: Four times a day (QID) | ORAL | Status: DC | PRN

## 2014-01-11 NOTE — ED Notes (Signed)
Pt reports 2 dental abscesses; left upper and lower teeth; went to dentist today, told the teeth need to be pulled but she couldn't pay; sent here for pain meds and referral to low cost dentist. Has been taking pencillin since Saturday.

## 2014-01-11 NOTE — Discharge Instructions (Signed)
Dental Caries  Dental caries (also called tooth decay) is the most common oral disease. It can occur at any age but is more common in children and young adults.   HOW DENTAL CARIES DEVELOPS   The process of decay begins when bacteria and foods (particularly sugars and starches) combine in your mouth to produce plaque. Plaque is a substance that sticks to the hard, outer surface of a tooth (enamel). The bacteria in plaque produce acids that attack enamel. These acids may also attack the root surface of a tooth (cementum) if it is exposed. Repeated attacks dissolve these surfaces and create holes in the tooth (cavities). If left untreated, the acids destroy the other layers of the tooth.   RISK FACTORS   Frequent sipping of sugary beverages.    Frequent snacking on sugary and starchy foods, especially those that easily get stuck in the teeth.    Poor oral hygiene.    Dry mouth.    Substance abuse such as methamphetamine abuse.    Broken or poor-fitting dental restorations.    Eating disorders.    Gastroesophageal reflux disease (GERD).    Certain radiation treatments to the head and neck.  SYMPTOMS  In the early stages of dental caries, symptoms are seldom present. Sometimes white, chalky areas may be seen on the enamel or other tooth layers. In later stages, symptoms may include:   Pits and holes on the enamel.   Toothache after sweet, hot, or cold foods or drinks are consumed.   Pain around the tooth.   Swelling around the tooth.  DIAGNOSIS   Most of the time, dental caries is detected during a regular dental checkup. A diagnosis is made after a thorough medical and dental history is taken and the surfaces of your teeth are checked for signs of dental caries. Sometimes special instruments, such as lasers, are used to check for dental caries. Dental X-ray exams may be taken so that areas not visible to the eye (such as between the contact areas of the teeth) can be checked for cavities.    TREATMENT   If dental caries is in its early stages, it may be reversed with a fluoride treatment or an application of a remineralizing agent at the dental office. Thorough brushing and flossing at home is needed to aid these treatments. If it is in its later stages, treatment depends on the location and extent of tooth destruction:    If a small area of the tooth has been destroyed, the destroyed area will be removed and cavities will be filled with a material such as gold, silver amalgam, or composite resin.    If a large area of the tooth has been destroyed, the destroyed area will be removed and a cap (crown) will be fitted over the remaining tooth structure.    If the center part of the tooth (pulp) is affected, a procedure called a root canal will be needed before a filling or crown can be placed.    If most of the tooth has been destroyed, the tooth may need to be pulled (extracted).  HOME CARE INSTRUCTIONS  You can prevent, stop, or reverse dental caries at home by practicing good oral hygiene. Good oral hygiene includes:   Thoroughly cleaning your teeth at least twice a day with a toothbrush and dental floss.    Using a fluoride toothpaste. A fluoride mouth rinse may also be used if recommended by your dentist or health care provider.    Restricting   the amount of sugary and starchy foods and sugary liquids you consume.    Avoiding frequent snacking on these foods and sipping of these liquids.    Keeping regular visits with a dentist for checkups and cleanings.  PREVENTION    Practice good oral hygiene.   Consider a dental sealant. A dental sealant is a coating material that is applied by your dentist to the pits and grooves of teeth. The sealant prevents food from being trapped in them. It may protect the teeth for several years.   Ask about fluoride supplements if you live in a community without fluorinated water or with water that has a low fluoride content. Use fluoride supplements  as directed by your dentist or health care provider.   Allow fluoride varnish applications to teeth if directed by your dentist or health care provider.  Document Released: 02/10/2002 Document Revised: 10/05/2013 Document Reviewed: 05/23/2012  [employer] Patient Information 2015 [employer]. This information is not intended to replace advice given to you by your health care provider. Make sure you discuss any questions you have with your health care provider.

## 2014-01-11 NOTE — ED Provider Notes (Signed)
Medical screening examination/treatment/procedure(s) were performed by non-physician practitioner and as supervising physician I was immediately available for consultation/collaboration.   EKG Interpretation None       Ethelda ChickMartha K Linker, MD 01/11/14 1818

## 2014-01-11 NOTE — ED Provider Notes (Signed)
CSN: [id number]     Arrival date & time 01/11/14  1506 History  This chart was scribed for Terri Piedra, PA-C working with Ethelda Chick, MD by Evon Slack, ED Scribe. This patient was seen in room TR06C/TR06C and the patient's care was started at 4:32 PM.      Chief Complaint  Patient presents with  . Dental Pain   Patient is a 43 y.o. female presenting with tooth pain. The history is provided by the patient. No language interpreter was used.  Dental Pain Associated symptoms: facial swelling   Associated symptoms: no fever    HPI Comments: Kristie Tapia is a 43 y.o. female who presents to the Emergency Department complaining of dental pain onset 4 days prior. She states she has associated facial swelling, and nausea.  She sates that she has 2 teeth that have abscess.She states she has been taking aleve, 800mg  ibuprofen and Orajel with no relief.  She states she was told to come here for a referral from the dentist she visited today. She states she denies fever, SOB, chest pain or chills.   Past Medical History  Diagnosis Date  . Anxiety   . Complication of anesthesia ` 1998    "lungs collapsed during LEEP procedure"  . Sleep apnea   . Type II diabetes mellitus   . Anemia   . GERD (gastroesophageal reflux disease)   . H/O hiatal hernia   . History of stomach ulcers   . Migraines   . Gallstones    Past Surgical History  Procedure Laterality Date  . Appendectomy  01/03/2012  . Abdominal hysterectomy  08/1998  . Dilation and curettage of uterus    . Abdominal exploration surgery      "I've had several"  . Tubal ligation  05/1998  . Leep  1998    "lungs collapsed"  . Laparoscopic appendectomy  01/03/2012    Procedure: APPENDECTOMY LAPAROSCOPIC;  Surgeon: Mariella Saa, MD;  Location: [facility] OR;  Service: General;  Laterality: N/A;   Family History  Problem Relation Age of Onset  . Breast cancer     History  Substance Use Topics  . Smoking status: Current Every  Day Smoker -- 1.00 packs/day for 29 years    Types: Cigarettes  . Smokeless tobacco: Never Used  . Alcohol Use: Yes     Comment: 01/03/2012 "6pk would last me a year"   OB History   Grav Para Term Preterm Abortions TAB SAB Ect Mult Living                 Review of Systems  Constitutional: Negative for fever and chills.  HENT: Positive for dental problem and facial swelling.   Respiratory: Negative for shortness of breath.   Cardiovascular: Negative for chest pain.  Gastrointestinal: Positive for nausea.  All other systems reviewed and are negative.   Allergies  Adhesive; Sulfa antibiotics; Decongestant; Prednisone; Percocet; Zoloft; and Latex  Home Medications   Prior to Admission medications   Medication Sig Start Date End Date Taking? Authorizing Provider  albuterol (PROVENTIL HFA;VENTOLIN HFA) 108 (90 BASE) MCG/ACT inhaler Inhale 2 puffs into the lungs every 4 (four) hours as needed for wheezing or shortness of breath.    Historical Provider, MD  amoxicillin (AMOXIL) 500 MG capsule Take 1 capsule (500 mg total) by mouth 3 (three) times daily. 01/11/14   Persephonie Hegwood A Forcucci, PA-C  aspirin-acetaminophen-caffeine (EXCEDRIN MIGRAINE) [phone] MG per tablet Take 1-2 tablets by mouth every 8 (eight) hours  as needed. headache    Historical Provider, MD  BYDUREON 2 MG PEN Inject 2 mg into the skin every 7 (seven) days. 09/16/13   Historical Provider, MD  calcium carbonate (TUMS EX) 750 MG chewable tablet Chew 2-3 tablets by mouth every 4 (four) hours as needed. indigestion    Historical Provider, MD  escitalopram (LEXAPRO) 20 MG tablet Take 20 mg by mouth at bedtime.    Historical Provider, MD  furosemide (LASIX) 40 MG tablet Take 40 mg by mouth daily.    Historical Provider, MD  HYDROcodone-acetaminophen (NORCO/VICODIN) 5-325 MG per tablet Take 1-2 tablets by mouth every 6 (six) hours as needed for moderate pain or severe pain. 01/11/14   Kiyra Slaubaugh A Forcucci, PA-C  LORazepam (ATIVAN) 1 MG  tablet Take 0.5-1 mg by mouth every 8 (eight) hours as needed. anxiety    Historical Provider, MD  Melatonin 5 MG TABS Take 15 mg by mouth at bedtime.    Historical Provider, MD  Multiple Vitamins-Minerals (MULTIVITAMIN PO) Take 1 tablet by mouth daily.    Historical Provider, MD  naproxen sodium (ALEVE) 220 MG tablet Take 220 mg by mouth 2 (two) times daily as needed (headache/pain).     Historical Provider, MD  omega-3 acid ethyl esters (LOVAZA) 1 G capsule Take 2,000 g by mouth 2 (two) times daily. 10/05/13   Historical Provider, MD  ondansetron (ZOFRAN) 4 MG tablet Take 1 tablet (4 mg total) by mouth every 6 (six) hours. 10/11/13   Mora Bellman, PA-C  prochlorperazine (COMPAZINE) 10 MG tablet Take 1 tablet (10 mg total) by mouth 2 (two) times daily as needed for nausea or vomiting (Nausea ). 10/11/13   Mora Bellman, PA-C  promethazine (PHENERGAN) 25 MG tablet Take 0.5-1 tablets (12.5-25 mg total) by mouth every 6 (six) hours as needed. nausea 01/20/13   Rodolph Bong, MD  tiZANidine (ZANAFLEX) 4 MG capsule Take 4 mg by mouth daily as needed for muscle spasms.    Historical Provider, MD   Triage Vitals: BP 132/85  Pulse 74  Temp(Src) 98.1 F (36.7 C) (Oral)  Resp 18  SpO2 95%  Physical Exam  Nursing note and vitals reviewed. Constitutional: She is oriented to person, place, and time. She appears well-developed and well-nourished. No distress.  HENT:  Head: Normocephalic and atraumatic.  Mouth/Throat: Uvula is midline, oropharynx is clear and moist and mucous membranes are normal. No trismus in the jaw. Abnormal dentition. Dental caries present. No dental abscesses, uvula swelling or lacerations. No oropharyngeal exudate, posterior oropharyngeal edema, posterior oropharyngeal erythema or tonsillar abscesses.    Eyes: Conjunctivae are normal. Pupils are equal, round, and reactive to light. No scleral icterus.  Neck: Normal range of motion. Neck supple. No JVD present. No tracheal  deviation present. No thyromegaly present.  Cardiovascular: Normal rate, regular rhythm and normal heart sounds.   Pulmonary/Chest: Effort normal and breath sounds normal. No respiratory distress. She has no wheezes. She has no rales. She exhibits no tenderness.  Lymphadenopathy:    She has cervical adenopathy.  Neurological: She is alert and oriented to person, place, and time.  Skin: Skin is warm and dry. She is not diaphoretic.  Psychiatric: She has a normal mood and affect. Her behavior is normal. Judgment and thought content normal.    ED Course  Procedures (including critical care time) DIAGNOSTIC STUDIES: Oxygen Saturation is 95% on RA, adequate by my interpretation.    COORDINATION OF CARE: 5:41 PM-Discussed treatment plan which includes antibiotics, pain  medication and dental referral with pt at bedside and pt agreed to plan.    Labs Review Labs Reviewed - No data to display  Imaging Review No results found.   EKG Interpretation None      MDM   Final diagnoses:  Pain, dental  Dental caries   Patient is a 43 y.o. Female who presents to the ED with dental pain on the top left and bottom left molars.  Patient was seen by a dentist today but would not offer antibiotics or pain medication as she could not afford to pay for her medicines.  Patient does not have any visible abscess on exam, but does appear to have significant tooth decay at this time.  Patient is stable at this time and has no signs of trismus, drooling, or ludwigs angina.  Patient will be given dental resource list at this time and referral to Dr. Georgie Chardivil.  I will provide prescriptions for amoxicillin and hydrocodone.  Patient is stable for discharge at this time.  Patient given return precautions of trismus, drooling, SOB, and swelling of the neck.  Patient understands and agrees with the above plan.     I personally performed the services described in this documentation, which was scribed in my presence. The  recorded information has been reviewed and is accurate.      Eben Burowourtney A Forcucci, PA-C 01/11/14 1754

## 2014-01-16 ENCOUNTER — Emergency Department (HOSPITAL_COMMUNITY)
Admission: EM | Admit: 2014-01-16 | Discharge: 2014-01-16 | Disposition: A | Discharge status: Home / Self Care | Payer: BC Managed Care – PPO | Attending: Emergency Medicine | Admitting: Emergency Medicine

## 2014-01-16 ENCOUNTER — Encounter (HOSPITAL_COMMUNITY): Payer: Self-pay | Admitting: Emergency Medicine

## 2014-01-16 DIAGNOSIS — Z8679 Personal history of other diseases of the circulatory system: Secondary | ICD-10-CM | POA: Insufficient documentation

## 2014-01-16 DIAGNOSIS — F17200 Nicotine dependence, unspecified, uncomplicated: Secondary | ICD-10-CM | POA: Insufficient documentation

## 2014-01-16 DIAGNOSIS — Z91040 Latex allergy status: Secondary | ICD-10-CM | POA: Diagnosis not present

## 2014-01-16 DIAGNOSIS — Z7982 Long term (current) use of aspirin: Secondary | ICD-10-CM | POA: Insufficient documentation

## 2014-01-16 DIAGNOSIS — Z8719 Personal history of other diseases of the digestive system: Secondary | ICD-10-CM | POA: Insufficient documentation

## 2014-01-16 DIAGNOSIS — K0889 Other specified disorders of teeth and supporting structures: Secondary | ICD-10-CM

## 2014-01-16 DIAGNOSIS — Z862 Personal history of diseases of the blood and blood-forming organs and certain disorders involving the immune mechanism: Secondary | ICD-10-CM | POA: Insufficient documentation

## 2014-01-16 DIAGNOSIS — E119 Type 2 diabetes mellitus without complications: Secondary | ICD-10-CM | POA: Insufficient documentation

## 2014-01-16 DIAGNOSIS — F411 Generalized anxiety disorder: Secondary | ICD-10-CM | POA: Insufficient documentation

## 2014-01-16 DIAGNOSIS — Z79899 Other long term (current) drug therapy: Secondary | ICD-10-CM | POA: Diagnosis not present

## 2014-01-16 DIAGNOSIS — Z792 Long term (current) use of antibiotics: Secondary | ICD-10-CM | POA: Insufficient documentation

## 2014-01-16 DIAGNOSIS — K089 Disorder of teeth and supporting structures, unspecified: Secondary | ICD-10-CM | POA: Diagnosis present

## 2014-01-16 MED ORDER — TRAMADOL HCL 50 MG PO TABS: 50.0000 mg | ORAL_TABLET | Freq: Four times a day (QID) | ORAL | Status: DC | PRN

## 2014-01-16 MED ORDER — TRAMADOL HCL 50 MG PO TABS
50.0000 mg | ORAL_TABLET | Freq: Once | ORAL | Status: AC
  Administered 2014-01-16: 50 mg via ORAL
  Filled 2014-01-16: qty 1

## 2014-01-16 NOTE — ED Provider Notes (Signed)
CSN: [id number]     Arrival date & time 01/16/14  1607 History  This chart was scribed for Santiago GladHeather Marven Veley, PA-C, working with Samuel JesterKathleen McManus, DO by Chestine SporeSoijett Blue, ED Scribe. The patient was seen in room TR08C/TR08C at 7:02 PM.     Chief Complaint  Patient presents with  . Dental Pain     The history is provided by the patient. No language interpreter was used.   HPI Comments: Kristie Tapia is a 43 y.o. female who presents to the Emergency Department complaining of upper dental pain onset 1 week. She states that she was seen at [facility] for the same issue. She states that the abx that she was given relieved the swelling. She states that she is out of pain medication. She states that she has an appointment with dentist on Friday for a tooth extraction. She states that she was given 10 hydrocodone pills from [facility].  She states that she has two more days left of the abx pills that were given to her on her last visit here. . She states that she has tried Aleve with no relief for her symptoms. She denies fever, chills, trouble swallowing, and any other associated symptoms.  Past Medical History  Diagnosis Date  . Anxiety   . Complication of anesthesia ` 1998    "lungs collapsed during LEEP procedure"  . Sleep apnea   . Type II diabetes mellitus   . Anemia   . GERD (gastroesophageal reflux disease)   . H/O hiatal hernia   . History of stomach ulcers   . Migraines   . Gallstones    Past Surgical History  Procedure Laterality Date  . Appendectomy  01/03/2012  . Abdominal hysterectomy  08/1998  . Dilation and curettage of uterus    . Abdominal exploration surgery      "I've had several"  . Tubal ligation  05/1998  . Leep  1998    "lungs collapsed"  . Laparoscopic appendectomy  01/03/2012    Procedure: APPENDECTOMY LAPAROSCOPIC;  Surgeon: Mariella SaaBenjamin T Hoxworth, MD;  Location: [facility] OR;  Service: General;  Laterality: N/A;   Family History  Problem Relation Age of Onset  . Breast cancer      History  Substance Use Topics  . Smoking status: Current Every Day Smoker -- 1.00 packs/day for 29 years    Types: Cigarettes  . Smokeless tobacco: Never Used  . Alcohol Use: Yes     Comment: 01/03/2012 "6pk would last me a year"   OB History   Grav Para Term Preterm Abortions TAB SAB Ect Mult Living                 Review of Systems  Constitutional: Negative for fever and chills.  HENT: Positive for dental problem (upper). Negative for trouble swallowing.        Painful to swallow      Allergies  Adhesive; Sulfa antibiotics; Decongestant; Prednisone; Percocet; Zoloft; and Latex  Home Medications   Prior to Admission medications   Medication Sig Start Date End Date Taking? Authorizing Provider  albuterol (PROVENTIL HFA;VENTOLIN HFA) 108 (90 BASE) MCG/ACT inhaler Inhale 2 puffs into the lungs every 4 (four) hours as needed for wheezing or shortness of breath.   Yes Historical Provider, MD  amoxicillin (AMOXIL) 500 MG capsule Take 500 mg by mouth 3 (three) times daily. Started medication on 01-11-14 01/11/14  Yes Courtney A Forcucci, PA-C  aspirin-acetaminophen-caffeine (EXCEDRIN MIGRAINE) [phone] MG per tablet Take 1-2 tablets by mouth  every 8 (eight) hours as needed. headache   Yes Historical Provider, MD  BYDUREON 2 MG PEN Inject 2 mg into the skin every 7 (seven) days. 09/16/13  Yes Historical Provider, MD  calcium carbonate (TUMS EX) 750 MG chewable tablet Chew 2-3 tablets by mouth every 4 (four) hours as needed. indigestion   Yes Historical Provider, MD  escitalopram (LEXAPRO) 20 MG tablet Take 20 mg by mouth at bedtime.   Yes Historical Provider, MD  furosemide (LASIX) 40 MG tablet Take 40 mg by mouth daily.   Yes Historical Provider, MD  HYDROcodone-acetaminophen (NORCO/VICODIN) 5-325 MG per tablet Take 1-2 tablets by mouth every 6 (six) hours as needed for moderate pain or severe pain. 01/11/14  Yes Courtney A Forcucci, PA-C  LORazepam (ATIVAN) 1 MG tablet Take 0.5-1 mg by  mouth every 8 (eight) hours as needed. anxiety   Yes Historical Provider, MD  Melatonin 5 MG TABS Take 15 mg by mouth at bedtime.   Yes Historical Provider, MD  Multiple Vitamins-Minerals (MULTIVITAMIN PO) Take 1 tablet by mouth daily.   Yes Historical Provider, MD  naproxen sodium (ALEVE) 220 MG tablet Take 220 mg by mouth 2 (two) times daily as needed (headache/pain).    Yes Historical Provider, MD  omega-3 acid ethyl esters (LOVAZA) 1 G capsule Take 2,000 g by mouth 2 (two) times daily. 10/05/13  Yes Historical Provider, MD  ondansetron (ZOFRAN) 4 MG tablet Take 4 mg by mouth every 6 (six) hours. 10/11/13  Yes Mora Bellman, PA-C  tiZANidine (ZANAFLEX) 4 MG capsule Take 4 mg by mouth daily as needed for muscle spasms.   Yes Historical Provider, MD   BP 151/93  Pulse 76  Temp(Src) 97.7 F (36.5 C) (Oral)  Resp 18  SpO2 97%  Physical Exam  Nursing note and vitals reviewed. Constitutional: She is oriented to person, place, and time. She appears well-developed and well-nourished. No distress.  HENT:  Head: Normocephalic and atraumatic.  Mouth/Throat: No trismus in the jaw. No dental abscesses.  Tender to palpation of left lower gingiva. No sublingual tenderness or swelling. No submandibular or submental lymphadenopathy.   Eyes: EOM are normal.  Neck: Neck supple. No tracheal deviation present.  Cardiovascular: Normal rate.   Pulmonary/Chest: Effort normal. No respiratory distress.  Musculoskeletal: Normal range of motion.  Neurological: She is alert and oriented to person, place, and time.  Skin: Skin is warm and dry.  Psychiatric: She has a normal mood and affect. Her behavior is normal.    ED Course  Procedures (including critical care time) DIAGNOSTIC STUDIES: Oxygen Saturation is 97% on room air, normal by my interpretation.    COORDINATION OF CARE: 7:07 PM-Discussed treatment plan which includes Ultram with pt at bedside and pt agreed to plan.   Labs Review Labs Reviewed  - No data to display  Imaging Review No results found.   EKG Interpretation None      MDM   Final diagnoses:  None   Patient with toothache.  No gross abscess.  Exam unconcerning for Ludwig's angina or spread of infection.  Urged patient to follow-up with dentist.  She has appointment scheduled in six days.  Return precautions given.   I personally performed the services described in this documentation, which was scribed in my presence. The recorded information has been reviewed and is accurate.    Santiago Glad, PA-C 01/16/14 1924

## 2014-01-16 NOTE — Discharge Instructions (Signed)
You have a dental injury. Use the resource guide listed below to help you find a dentist if you do not already have one to followup with. It is very important that you get evaluated by a dentist as soon as possible. Call tomorrow to schedule an appointment. Use your pain medication as prescribed and do not operate heavy machinery while on pain medication. Note that your pain medication contains acetaminophen (Tylenol) & its is not recommended that you use additional acetaminophen (Tylenol) while taking this medication. Take your full course of antibiotics. Read the instructions below. ° °Eat a soft or liquid diet and rinse your mouth out after meals with warm water. You should see a dentist or return here at once if you have increased swelling, increased pain or uncontrolled bleeding from the site of your injury. ° ° °SEEK MEDICAL CARE IF:  °· You have increased pain not controlled with medicines.  °· You have swelling around your tooth, in your face or neck.  °· You have bleeding which starts, continues, or gets worse.  °· You have a fever >101 °· If you are unable to open your mouth ° °RESOURCE GUIDE ° °Dental Problems ° °Patients with Medicaid: °[facility]                     [facility] °[address]                                           [address] °Phone:  [phone]                                                  Phone:  [phone] ° °If unable to pay or uninsured, contact:  [facility] or [facility] to become qualified for the adult dental clinic. ° °Chronic Pain Problems °Contact [facility]  [phone] °Patients need to be referred by their primary care doctor. ° °Insufficient Money for Medicine °Contact [employer]:  call "211" or [facility] 271-5999. ° °No Primary Care Doctor °Call [facility]  832-8000 °Other agencies that provide inexpensive medical care °   [facility]  [phone] °   [facility]  [phone] °   [facility]  [phone] °   [facility]  [phone] °   [facility]  [phone] °   [facility]  [phone] ° °Psychological Services °[facility]  [phone] °[facility]  [phone] °[facility]   [phone] (emergency services [phone]) ° °Substance Abuse Resources °[facility]  336-882-2125 °[facility] 336-784-9470 °[facility] 336-285-9073 °[facility] 336-845-3988 °Residential & Outpatient Substance Abuse Program  800-659-3381 ° °Abuse/Neglect °[place] [facility] (336) 641-3795 °[place] [facility] 800-378-5315 (After Hours) ° °Emergency Shelter °[facility] (336) 271-5985 ° °Maternity Homes °[facility] (336) 275-9566 °[facility] (704) 372-4663 ° °MRSA Hotline #:   832-7006 ° ° ° °[place] [facility] ° °[facility]     [employer]                            [facility] °[address] [place]                       [address]      [address]  °[place]                                                [place]                            [place] °Phone:  349-3220                                   Phone:  [phone]                 Phone:  342-8140 ° °[facility] °Phone:  342-8316 ° °[place] [facility] °(336) 342-1394 °(336) 342-3537 (After Hours) ° ° ° ° °

## 2014-01-16 NOTE — ED Notes (Signed)
Pt reports upper dental pain x 1 week. Seen here Monday for same, states abx have relieved swelling but she is out of pain medication. Pt states she has appt on Friday for tooth extraction.

## 2014-01-18 NOTE — ED Provider Notes (Signed)
Medical screening examination/treatment/procedure(s) were performed by non-physician practitioner and as supervising physician I was immediately available for consultation/collaboration.   EKG Interpretation None        Deangela Randleman, DO 01/18/14 1547 

## 2014-05-11 ENCOUNTER — Encounter (HOSPITAL_COMMUNITY): Payer: Self-pay | Admitting: Emergency Medicine

## 2014-05-11 ENCOUNTER — Emergency Department (HOSPITAL_COMMUNITY)
Admission: EM | Admit: 2014-05-11 | Discharge: 2014-05-11 | Disposition: A | Discharge status: Home / Self Care | Payer: BC Managed Care – PPO | Attending: Emergency Medicine | Admitting: Emergency Medicine

## 2014-05-11 ENCOUNTER — Emergency Department (HOSPITAL_COMMUNITY): Payer: BC Managed Care – PPO

## 2014-05-11 DIAGNOSIS — Z720 Tobacco use: Secondary | ICD-10-CM | POA: Diagnosis not present

## 2014-05-11 DIAGNOSIS — Z7982 Long term (current) use of aspirin: Secondary | ICD-10-CM | POA: Insufficient documentation

## 2014-05-11 DIAGNOSIS — E119 Type 2 diabetes mellitus without complications: Secondary | ICD-10-CM | POA: Diagnosis not present

## 2014-05-11 DIAGNOSIS — Z8669 Personal history of other diseases of the nervous system and sense organs: Secondary | ICD-10-CM | POA: Insufficient documentation

## 2014-05-11 DIAGNOSIS — Z91040 Latex allergy status: Secondary | ICD-10-CM | POA: Diagnosis not present

## 2014-05-11 DIAGNOSIS — K219 Gastro-esophageal reflux disease without esophagitis: Secondary | ICD-10-CM | POA: Diagnosis not present

## 2014-05-11 DIAGNOSIS — R110 Nausea: Secondary | ICD-10-CM | POA: Insufficient documentation

## 2014-05-11 DIAGNOSIS — R05 Cough: Secondary | ICD-10-CM | POA: Insufficient documentation

## 2014-05-11 DIAGNOSIS — Z862 Personal history of diseases of the blood and blood-forming organs and certain disorders involving the immune mechanism: Secondary | ICD-10-CM | POA: Diagnosis not present

## 2014-05-11 DIAGNOSIS — Z79899 Other long term (current) drug therapy: Secondary | ICD-10-CM | POA: Diagnosis not present

## 2014-05-11 DIAGNOSIS — Z792 Long term (current) use of antibiotics: Secondary | ICD-10-CM | POA: Diagnosis not present

## 2014-05-11 DIAGNOSIS — R0789 Other chest pain: Secondary | ICD-10-CM | POA: Insufficient documentation

## 2014-05-11 DIAGNOSIS — R079 Chest pain, unspecified: Secondary | ICD-10-CM

## 2014-05-11 DIAGNOSIS — F419 Anxiety disorder, unspecified: Secondary | ICD-10-CM | POA: Diagnosis not present

## 2014-05-11 LAB — BASIC METABOLIC PANEL
Anion gap: 14 (ref 5–15)
BUN: 4 mg/dL — AB (ref 6–23)
CHLORIDE: 100 meq/L (ref 96–112)
CO2: 27 mEq/L (ref 19–32)
Calcium: 9.5 mg/dL (ref 8.4–10.5)
Creatinine, Ser: 0.83 mg/dL (ref 0.50–1.10)
GFR calc Af Amer: >90 mL/min (ref >90)
GFR calc non Af Amer: 85 mL/min — ABNORMAL LOW (ref >90)
GLUCOSE: 134 mg/dL — AB (ref 70–99)
Potassium: 4.5 mEq/L (ref 3.7–5.3)
Sodium: 141 mEq/L (ref 137–147)

## 2014-05-11 LAB — I-STAT TROPONIN, ED: Troponin i, poc: 0 ng/mL (ref 0.00–0.08)

## 2014-05-11 LAB — CBC
HEMATOCRIT: 44.5 % (ref 36.0–46.0)
HEMOGLOBIN: 15.3 g/dL — AB (ref 12.0–15.0)
MCH: 30.7 pg (ref 26.0–34.0)
MCHC: 34.4 g/dL (ref 30.0–36.0)
MCV: 89.4 fL (ref 78.0–100.0)
Platelets: 412 10*3/uL — ABNORMAL HIGH (ref 150–400)
RBC: 4.98 MIL/uL (ref 3.87–5.11)
RDW: 14.6 % (ref 11.5–15.5)
WBC: 11.7 10*3/uL — ABNORMAL HIGH (ref 4.0–10.5)

## 2014-05-11 LAB — TROPONIN I

## 2014-05-11 MED ORDER — GI COCKTAIL ~~LOC~~
30.0000 mL | Freq: Once | ORAL | Status: AC
  Administered 2014-05-11: 30 mL via ORAL
  Filled 2014-05-11: qty 30

## 2014-05-11 NOTE — ED Provider Notes (Signed)
CSN: [id number]     Arrival date & time 05/11/14  1355 History   First MD Initiated Contact with Patient 05/11/14 1604     Chief Complaint  Patient presents with  . Chest Pain     (Consider location/radiation/quality/duration/timing/severity/associated sxs/prior Treatment) Patient is a 44 y.o. female presenting with chest pain.  Chest Pain Pain location:  L chest Pain quality: sharp (at times) and tightness (at times)   Pain radiates to:  Neck Pain severity:  Moderate Onset quality:  Sudden Duration: a few minutes. Timing:  Intermittent Progression:  Unchanged Chronicity:  New Context: at rest   Context comment:  Not worse with exertion Relieved by:  Nothing Worsened by:  Nothing tried Ineffective treatments:  None tried Associated symptoms: cough (mild) and nausea   Associated symptoms: no fever, no shortness of breath and not vomiting     Past Medical History  Diagnosis Date  . Anxiety   . Complication of anesthesia ` 1998    "lungs collapsed during LEEP procedure"  . Sleep apnea   . Type II diabetes mellitus   . Anemia   . GERD (gastroesophageal reflux disease)   . H/O hiatal hernia   . History of stomach ulcers   . Migraines   . Gallstones    Past Surgical History  Procedure Laterality Date  . Appendectomy  01/03/2012  . Abdominal hysterectomy  08/1998  . Dilation and curettage of uterus    . Abdominal exploration surgery      "I've had several"  . Tubal ligation  05/1998  . Leep  1998    "lungs collapsed"  . Laparoscopic appendectomy  01/03/2012    Procedure: APPENDECTOMY LAPAROSCOPIC;  Surgeon: Mariella Saa, MD;  Location: [facility] OR;  Service: General;  Laterality: N/A;   Family History  Problem Relation Age of Onset  . Breast cancer     History  Substance Use Topics  . Smoking status: Current Every Day Smoker -- 1.00 packs/day for 29 years    Types: Cigarettes  . Smokeless tobacco: Never Used  . Alcohol Use: Yes     Comment: 01/03/2012 "6pk would  last me a year"   OB History    No data available     Review of Systems  Constitutional: Negative for fever.  Respiratory: Positive for cough (mild). Negative for shortness of breath.   Cardiovascular: Positive for chest pain.  Gastrointestinal: Positive for nausea. Negative for vomiting.  All other systems reviewed and are negative.     Allergies  Adhesive; Sulfa antibiotics; Decongestant; Prednisone; Percocet; Zoloft; and Latex  Home Medications   Prior to Admission medications   Medication Sig Start Date End Date Taking? Authorizing Provider  albuterol (PROVENTIL HFA;VENTOLIN HFA) 108 (90 BASE) MCG/ACT inhaler Inhale 2 puffs into the lungs every 4 (four) hours as needed for wheezing or shortness of breath.    Historical Provider, MD  amoxicillin (AMOXIL) 500 MG capsule Take 500 mg by mouth 3 (three) times daily. Started medication on 01-11-14 01/11/14   Courtney A Forcucci, PA-C  aspirin-acetaminophen-caffeine (EXCEDRIN MIGRAINE) [phone] MG per tablet Take 1-2 tablets by mouth every 8 (eight) hours as needed. headache    Historical Provider, MD  BYDUREON 2 MG PEN Inject 2 mg into the skin every 7 (seven) days. 09/16/13   Historical Provider, MD  calcium carbonate (TUMS EX) 750 MG chewable tablet Chew 2-3 tablets by mouth every 4 (four) hours as needed. indigestion    Historical Provider, MD  escitalopram (LEXAPRO) 20  MG tablet Take 20 mg by mouth at bedtime.    Historical Provider, MD  furosemide (LASIX) 40 MG tablet Take 40 mg by mouth daily.    Historical Provider, MD  HYDROcodone-acetaminophen (NORCO/VICODIN) 5-325 MG per tablet Take 1-2 tablets by mouth every 6 (six) hours as needed for moderate pain or severe pain. 01/11/14   Courtney A Forcucci, PA-C  LORazepam (ATIVAN) 1 MG tablet Take 0.5-1 mg by mouth every 8 (eight) hours as needed. anxiety    Historical Provider, MD  Melatonin 5 MG TABS Take 15 mg by mouth at bedtime.    Historical Provider, MD  Multiple  Vitamins-Minerals (MULTIVITAMIN PO) Take 1 tablet by mouth daily.    Historical Provider, MD  naproxen sodium (ALEVE) 220 MG tablet Take 220 mg by mouth 2 (two) times daily as needed (headache/pain).     Historical Provider, MD  omega-3 acid ethyl esters (LOVAZA) 1 G capsule Take 2,000 g by mouth 2 (two) times daily. 10/05/13   Historical Provider, MD  ondansetron (ZOFRAN) 4 MG tablet Take 4 mg by mouth every 6 (six) hours. 10/11/13   Mora BellmanHannah S Merrell, PA-C  tiZANidine (ZANAFLEX) 4 MG capsule Take 4 mg by mouth daily as needed for muscle spasms.    Historical Provider, MD  traMADol (ULTRAM) 50 MG tablet Take 1 tablet (50 mg total) by mouth every 6 (six) hours as needed. 01/16/14   Heather Laisure, PA-C   BP 115/67 mmHg  Pulse 80  Temp(Src) 97.9 F (36.6 C) (Oral)  Resp 18  Ht 5\' 4"  (1.626 m)  Wt 185 lb (83.915 kg)  BMI 31.74 kg/m2  SpO2 97% Physical Exam  Constitutional: She is oriented to person, place, and time. She appears well-developed and well-nourished. No distress.  HENT:  Head: Normocephalic and atraumatic.  Mouth/Throat: Oropharynx is clear and moist.  Eyes: Conjunctivae are normal. Pupils are equal, round, and reactive to light. No scleral icterus.  Neck: Neck supple.  Cardiovascular: Normal rate, regular rhythm, normal heart sounds and intact distal pulses.   No murmur heard. Pulmonary/Chest: Effort normal and breath sounds normal. No stridor. No respiratory distress. She has no rales. She exhibits tenderness (left parasternal).  Abdominal: Soft. Bowel sounds are normal. She exhibits no distension. There is no tenderness. There is no rebound and no guarding.  Musculoskeletal: Normal range of motion. She exhibits no edema or tenderness.  Neurological: She is alert and oriented to person, place, and time.  Skin: Skin is warm and dry. No rash noted.  Psychiatric: She has a normal mood and affect. Her behavior is normal.  Nursing note and vitals reviewed.   ED Course   Procedures (including critical care time) Labs Review Labs Reviewed  CBC - Abnormal; Notable for the following:    WBC 11.7 (*)    Hemoglobin 15.3 (*)    Platelets 412 (*)    All other components within normal limits  BASIC METABOLIC PANEL - Abnormal; Notable for the following:    Glucose, Bld 134 (*)    BUN 4 (*)    GFR calc non Af Amer 85 (*)    All other components within normal limits  TROPONIN I  Rosezena SensorI-STAT TROPOININ, ED    Imaging Review Dg Chest 2 View  05/11/2014   CLINICAL DATA:  43 year old female with left chest pain extending to back. Off and on shortness of breath. History of pneumonia, bronchitis and diabetes. Initial encounter.  EXAM: CHEST  2 VIEW  COMPARISON:  09/20/2009.  FINDINGS: No  infiltrate, congestive heart failure or pneumothorax.  Heart size within normal limits.  Minimal degenerative changes lower thoracic and upper lumbar spine.  IMPRESSION: No acute abnormality.   Electronically Signed   By: Bridgett LarssonSteve  Olson M.D.   On: 05/11/2014 15:30  All radiology studies independently viewed by me.      EKG Interpretation   Date/Time:  Tuesday May 11 2014 14:00:13 EST Ventricular Rate:  96 PR Interval:  154 QRS Duration: 72 QT Interval:  362 QTC Calculation: 457 R Axis:   67 Text Interpretation:  Normal sinus rhythm Nonspecific T wave abnormality  Abnormal ECG nonspecific t wave changes not significantly changed from  prior. Confirmed by [facility] PalmWOFFORD  MD, TREY ([id number]) on 05/11/2014 4:06:03 PM      MDM   Final diagnoses:  Chest pain, unspecified chest pain type    43 yo female with intermittent chest pain starting today.  Pain is atypical, sometimes sharp, sometimes tight, is not associated with anything in particular including exertion.  She has left sided chest wall tenderness, suggesting pain might be MSK in nature.  History is atypical for ACS and she has low Heart Score.  Pain radiates to back, but I have a low suspicion for Dissection.  CXR normal, normal BP,  equal BUE pulses, and no active tearing pain.  PE unlikely and she is PERC negative.  Plan treatment with GI cocktail.    Delta trop negative.  Pt remained well appearing.  We discussed outpatient follow up and return precautions.    Warnell Foresterrey Jaidin Richison, MD 05/11/14 2352

## 2014-05-11 NOTE — ED Notes (Signed)
Attempted to obtain lab specimens without success. Phlebotomy notified.

## 2014-05-11 NOTE — Discharge Instructions (Signed)

## 2014-05-11 NOTE — ED Notes (Signed)
Pt reports cp L sided cp that started this morning and episodes of feeling extremely fatigue and nausea. Pt also c/o back pain that started yesterday. Skin warm and dry. resp e/u.

## 2015-07-29 ENCOUNTER — Emergency Department (INDEPENDENT_AMBULATORY_CARE_PROVIDER_SITE_OTHER)
Admission: EM | Admit: 2015-07-29 | Discharge: 2015-07-29 | Disposition: A | Discharge status: Home / Self Care | Payer: Self-pay | Source: Home / Self Care | Attending: Family Medicine | Admitting: Family Medicine

## 2015-07-29 ENCOUNTER — Encounter (HOSPITAL_COMMUNITY): Payer: Self-pay | Admitting: Emergency Medicine

## 2015-07-29 ENCOUNTER — Emergency Department (INDEPENDENT_AMBULATORY_CARE_PROVIDER_SITE_OTHER): Payer: Self-pay

## 2015-07-29 DIAGNOSIS — S0093XA Contusion of unspecified part of head, initial encounter: Secondary | ICD-10-CM

## 2015-07-29 DIAGNOSIS — J40 Bronchitis, not specified as acute or chronic: Secondary | ICD-10-CM

## 2015-07-29 LAB — COMPREHENSIVE METABOLIC PANEL
ALK PHOS: 108 U/L (ref 38–126)
ALT: 12 U/L — ABNORMAL LOW (ref 14–54)
AST: 16 U/L (ref 15–41)
Albumin: 4.1 g/dL (ref 3.5–5.0)
Anion gap: 14 (ref 5–15)
BUN: <5 mg/dL — ABNORMAL LOW (ref 6–20)
CO2: 26 mmol/L (ref 22–32)
Calcium: 9.4 mg/dL (ref 8.9–10.3)
Chloride: 90 mmol/L — ABNORMAL LOW (ref 101–111)
Creatinine, Ser: 0.66 mg/dL (ref 0.44–1.00)
Glucose, Bld: 571 mg/dL (ref 65–99)
Potassium: 4.1 mmol/L (ref 3.5–5.1)
SODIUM: 130 mmol/L — AB (ref 135–145)
Total Bilirubin: 0.6 mg/dL (ref 0.3–1.2)
Total Protein: 7.1 g/dL (ref 6.5–8.1)

## 2015-07-29 MED ORDER — DEXAMETHASONE 4 MG PO TABS
ORAL_TABLET | ORAL | Status: AC
  Filled 2015-07-29: qty 2

## 2015-07-29 MED ORDER — DEXAMETHASONE 2 MG PO TABS
ORAL_TABLET | ORAL | Status: AC
  Filled 2015-07-29: qty 1

## 2015-07-29 MED ORDER — BENZONATATE 100 MG PO CAPS: 100.0000 mg | ORAL_CAPSULE | Freq: Three times a day (TID) | ORAL | Status: DC | PRN

## 2015-07-29 MED ORDER — DEXAMETHASONE 4 MG PO TABS
10.0000 mg | ORAL_TABLET | Freq: Once | ORAL | Status: AC
  Administered 2015-07-29: 10 mg via ORAL

## 2015-07-29 MED ORDER — DEXAMETHASONE 4 MG PO TABS: 10.0000 mg | ORAL_TABLET | Freq: Two times a day (BID) | ORAL | Status: DC

## 2015-07-29 MED ORDER — ITRACONAZOLE 200 MG PO TABS: 200.0000 mg | ORAL_TABLET | Freq: Every day | ORAL | Status: DC

## 2015-07-29 NOTE — ED Provider Notes (Addendum)
CSN: [id number]     Arrival date & time 07/29/15  1344 History   None    Chief Complaint  Patient presents with  . URI   (Consider location/radiation/quality/duration/timing/severity/associated sxs/prior Treatment) HPI  Cough. Started 3-4 weeks ago. Started the day after patient cleaned out her parent and cockatiel aviary and chicken tube. States that the aviary area had had a significant buildup in fecal matter for some time prior to being cleaned. Patient reports a significant amount of air subluxation of the material during cleanup. Symptoms are intermittent and getting worse. States that over the last day or so she's had a foul smell in her nose. Denies any fevers, productive cough, nausea, vomiting, rash, joint swelling, unintentional weight loss, back pain, dysuria, urgency, diarrhea.  Additionally patient with headache pain caused by hitting her head on a loading [place]. States that she has a large goose egg on her head with the central portion depressed. Denies any LOC, confusion, foggy headed feeling. Pain is constant. Patient with baseline migraines which are not worse than normal.  Delsym with limited improvement in the cough. Past Medical History  Diagnosis Date  . Anxiety   . Complication of anesthesia ` 1998    "lungs collapsed during LEEP procedure"  . Sleep apnea   . Type II diabetes mellitus (HCC)   . Anemia   . GERD (gastroesophageal reflux disease)   . H/O hiatal hernia   . History of stomach ulcers   . Migraines   . Gallstones    Past Surgical History  Procedure Laterality Date  . Appendectomy  01/03/2012  . Abdominal hysterectomy  08/1998  . Dilation and curettage of uterus    . Abdominal exploration surgery      "I've had several"  . Tubal ligation  05/1998  . Leep  1998    "lungs collapsed"  . Laparoscopic appendectomy  01/03/2012    Procedure: APPENDECTOMY LAPAROSCOPIC;  Surgeon: Mariella Saa, MD;  Location: [facility] OR;  Service: General;  Laterality: N/A;    Family History  Problem Relation Age of Onset  . Breast cancer     Social History  Substance Use Topics  . Smoking status: Current Every Day Smoker -- 1.00 packs/day for 29 years    Types: Cigarettes  . Smokeless tobacco: Never Used  . Alcohol Use: Yes     Comment: 01/03/2012 "6pk would last me a year"   OB History    No data available     Review of Systems Per HPI with all other pertinent systems negative.   Allergies  Adhesive; Sulfa antibiotics; Decongestant; Prednisone; Percocet; Zoloft; and Latex  Home Medications   Prior to Admission medications   Medication Sig Start Date End Date Taking? Authorizing Provider  esomeprazole (NEXIUM) 20 MG capsule Take 20 mg by mouth daily.   Yes Historical Provider, MD  LORazepam (ATIVAN) 1 MG tablet Take 0.5-1 mg by mouth every 8 (eight) hours as needed. anxiety   Yes Historical Provider, MD  Melatonin 5 MG TABS Take 15 mg by mouth at bedtime.   Yes Historical Provider, MD  Multiple Vitamins-Minerals (MULTIVITAMIN PO) Take 1 tablet by mouth daily.   Yes Historical Provider, MD  naproxen sodium (ALEVE) 220 MG tablet Take 220 mg by mouth 2 (two) times daily as needed (headache/pain).    Yes Historical Provider, MD  albuterol (PROVENTIL HFA;VENTOLIN HFA) 108 (90 BASE) MCG/ACT inhaler Inhale 2 puffs into the lungs every 4 (four) hours as needed for wheezing or shortness of  breath.    Historical Provider, MD  amoxicillin (AMOXIL) 500 MG capsule Take 500 mg by mouth 3 (three) times daily. Started medication on 01-11-14 01/11/14   Terri Piedra, PA-C  aspirin-acetaminophen-caffeine (EXCEDRIN MIGRAINE) [phone] MG per tablet Take 1-2 tablets by mouth every 8 (eight) hours as needed. headache    Historical Provider, MD  benzonatate (TESSALON PERLES) 100 MG capsule Take 1-2 capsules (100-200 mg total) by mouth 3 (three) times daily as needed for cough. 07/29/15   Ozella Rocks, MD  BYDUREON 2 MG PEN Inject 2 mg into the skin every 7 (seven)  days. 09/16/13   Historical Provider, MD  calcium carbonate (TUMS EX) 750 MG chewable tablet Chew 2-3 tablets by mouth every 4 (four) hours as needed. indigestion    Historical Provider, MD  dexamethasone (DECADRON) 4 MG tablet Take 2.5 tablets (10 mg total) by mouth 2 (two) times daily with a meal. 07/29/15   Ozella Rocks, MD  escitalopram (LEXAPRO) 20 MG tablet Take 20 mg by mouth at bedtime.    Historical Provider, MD  furosemide (LASIX) 40 MG tablet Take 40 mg by mouth daily.    Historical Provider, MD  HYDROcodone-acetaminophen (NORCO/VICODIN) 5-325 MG per tablet Take 1-2 tablets by mouth every 6 (six) hours as needed for moderate pain or severe pain. Patient not taking: Reported on 05/11/2014 01/11/14   Toni Amend Forcucci, PA-C  insulin detemir (LEVEMIR) 100 UNIT/ML injection Inject 65 Units into the skin at bedtime.    Historical Provider, MD  Itraconazole 200 MG TABS Take 200 mg by mouth daily. 07/29/15   Ozella Rocks, MD  Liraglutide (VICTOZA) 18 MG/3ML SOPN Inject 1.8 mg into the skin every morning.    Historical Provider, MD  omega-3 acid ethyl esters (LOVAZA) 1 G capsule Take 2,000 g by mouth 2 (two) times daily. 10/05/13   Historical Provider, MD  ondansetron (ZOFRAN) 4 MG tablet Take 4 mg by mouth every 6 (six) hours. 10/11/13   Junious Silk, PA-C  promethazine (PHENERGAN) 25 MG tablet Take 25 mg by mouth every 6 (six) hours as needed for nausea or vomiting.    Historical Provider, MD  rizatriptan (MAXALT) 5 MG tablet Take 5 mg by mouth as needed for migraine. May repeat in 2 hours if needed    Historical Provider, MD  tiZANidine (ZANAFLEX) 4 MG capsule Take 4 mg by mouth daily as needed for muscle spasms.    Historical Provider, MD  traMADol (ULTRAM) 50 MG tablet Take 1 tablet (50 mg total) by mouth every 6 (six) hours as needed. Patient not taking: Reported on 05/11/2014 01/16/14   Santiago Glad, PA-C   Meds Ordered and Administered this Visit   Medications  dexamethasone  (DECADRON) tablet 10 mg (10 mg Oral Given 07/29/15 1633)    BP 116/62 mmHg  Pulse 97  Temp(Src) 98.1 F (36.7 C) (Oral)  Resp 16  SpO2 95% No data found.   Physical Exam Physical Exam  Constitutional: oriented to person, place, and time. appears well-developed and well-nourished. No distress.  HENT:  Head: Normocephalic and atraumatic.  Eyes: EOMI. PERRL.  Neck: Normal range of motion.  Cardiovascular: RRR, no m/r/g, 2+ distal pulses,  Pulmonary/Chest: Effort normal and breath sounds normal. No respiratory distress.  Abdominal: Soft. Bowel sounds are normal. NonTTP, no distension.  Musculoskeletal: Top of head with 2 x 4 cm area of induration and tenderness with mild central depression. Is consistent with a superficial contusion.  Neurological: alert and oriented to person, place,  and time.  Skin: Skin is warm. No rash noted. non diaphoretic.  Psychiatric: normal mood and affect. behavior is normal. Judgment and thought content normal.   ED Course  Procedures (including critical care time)  Labs Review Labs Reviewed  COMPREHENSIVE METABOLIC PANEL    Imaging Review Dg Chest 2 View  07/29/2015  CLINICAL DATA:  Cough for approximately 1 month. Extensive exposure to birds and bird waste EXAM: CHEST  2 VIEW COMPARISON:  February 16, 2015 FINDINGS: Lungs are clear. Heart size and pulmonary vascularity are normal. No adenopathy. No bone lesions. IMPRESSION: No edema or consolidation. Electronically Signed   By: Bretta Bang III M.D.   On: 07/29/2015 15:38     Visual Acuity Review  Right Eye Distance:   Left Eye Distance:   Bilateral Distance:    Right Eye Near:   Left Eye Near:    Bilateral Near:         MDM   1. Head contusion, initial encounter   2. Bronchitis    Symptoms concerning for possible histoplasmosis or extremities infection. Will treat with itraconazole. Lab sent to further analyze this but for antigens. CMET drawn to evaluate liver function.  We'll also dose Decadron to assist patient with bronchial irritation and cough symptoms. Her Head contusion can be easily treated with warm compresses and Tylenol and ibuprofen. Of note patient is a somewhat poorly controlled diabetic she is aware that steroids will make her sugars go up and has been counseled in great detail with regards to help titrate her insulin in order to manage her diabetes while on the steroids. CMET currently showing hyperglycemia >500 w/o metabolic acidosis. Pt aware and will increase her insulin and frequency of CBG.       Ozella Rocks, MD 07/29/15 1644  Ozella Rocks, MD 08/08/15 [phone]

## 2015-07-29 NOTE — ED Notes (Signed)
C/o cold sx onset x2 months associated w/a prod cough and chest congetion A&O x4... No acute distress.

## 2015-07-29 NOTE — Discharge Instructions (Signed)
You may have developed a fungal infection in her lungs because of the birds. Please start the antifungal medicine. Your chest x-ray did not show pneumonia. Please use the steroids for additional cough relief. Please use the Tessalon Perles also for cough relief. Please come back if you do not get better. He'll need to take the antifungal medicine for 8 weeks. Please follow-up with your primary care physician in approximately 4 weeks. We have tested your blood for other possible infections and we'll let you know if those results are positive.

## 2015-08-01 LAB — HISTOPLASMA ANTIBODIES
Histoplasma Ab Yeast CF: <1:8 {titer}
Mycelia phase ab: <1:8 {titer}

## 2015-08-01 LAB — COCCIDIOIDES ANTIBODIES

## 2015-08-03 ENCOUNTER — Emergency Department (HOSPITAL_COMMUNITY): Payer: BLUE CROSS/BLUE SHIELD

## 2015-08-03 ENCOUNTER — Encounter (HOSPITAL_COMMUNITY): Payer: Self-pay | Admitting: Family Medicine

## 2015-08-03 ENCOUNTER — Emergency Department (HOSPITAL_COMMUNITY)
Admission: EM | Admit: 2015-08-03 | Discharge: 2015-08-03 | Disposition: A | Discharge status: Home / Self Care | Payer: BLUE CROSS/BLUE SHIELD | Attending: Emergency Medicine | Admitting: Emergency Medicine

## 2015-08-03 DIAGNOSIS — Z79899 Other long term (current) drug therapy: Secondary | ICD-10-CM | POA: Diagnosis not present

## 2015-08-03 DIAGNOSIS — Z862 Personal history of diseases of the blood and blood-forming organs and certain disorders involving the immune mechanism: Secondary | ICD-10-CM | POA: Insufficient documentation

## 2015-08-03 DIAGNOSIS — E119 Type 2 diabetes mellitus without complications: Secondary | ICD-10-CM | POA: Insufficient documentation

## 2015-08-03 DIAGNOSIS — F419 Anxiety disorder, unspecified: Secondary | ICD-10-CM | POA: Diagnosis not present

## 2015-08-03 DIAGNOSIS — K219 Gastro-esophageal reflux disease without esophagitis: Secondary | ICD-10-CM | POA: Insufficient documentation

## 2015-08-03 DIAGNOSIS — G43909 Migraine, unspecified, not intractable, without status migrainosus: Secondary | ICD-10-CM | POA: Insufficient documentation

## 2015-08-03 DIAGNOSIS — R05 Cough: Secondary | ICD-10-CM | POA: Diagnosis present

## 2015-08-03 DIAGNOSIS — Z792 Long term (current) use of antibiotics: Secondary | ICD-10-CM | POA: Insufficient documentation

## 2015-08-03 DIAGNOSIS — J069 Acute upper respiratory infection, unspecified: Secondary | ICD-10-CM | POA: Insufficient documentation

## 2015-08-03 DIAGNOSIS — Z91040 Latex allergy status: Secondary | ICD-10-CM | POA: Insufficient documentation

## 2015-08-03 DIAGNOSIS — F17210 Nicotine dependence, cigarettes, uncomplicated: Secondary | ICD-10-CM | POA: Insufficient documentation

## 2015-08-03 DIAGNOSIS — Z794 Long term (current) use of insulin: Secondary | ICD-10-CM | POA: Insufficient documentation

## 2015-08-03 MED ORDER — AZITHROMYCIN 250 MG PO TABS: 250.0000 mg | ORAL_TABLET | Freq: Every day | ORAL | Status: DC

## 2015-08-03 NOTE — ED Notes (Signed)
Pt her for cough, congestion, body pains. sts that it all started after cleaning out her bird cages. sts she was seen at [facility] and they prescribed her fungal medication but she couldn't afford it.

## 2015-08-03 NOTE — Discharge Instructions (Signed)
Take your antibiotics as prescribed. If your symptoms do not improve, schedule a follow up appointment with a doctor from the resource guide for possible pulmonology follow up. Return to ED with new, worsening or concerning symptoms.    Upper Respiratory Infection, Adult Most upper respiratory infections (URIs) are a viral infection of the air passages leading to the lungs. A URI affects the nose, throat, and upper air passages. The most common type of URI is nasopharyngitis and is typically referred to as "the common cold." URIs run their course and usually go away on their own. Most of the time, a URI does not require medical attention, but sometimes a bacterial infection in the upper airways can follow a viral infection. This is called a secondary infection. Sinus and middle ear infections are common types of secondary upper respiratory infections. Bacterial pneumonia can also complicate a URI. A URI can worsen asthma and chronic obstructive pulmonary disease (COPD). Sometimes, these complications can require emergency medical care and may be life threatening.  CAUSES Almost all URIs are caused by viruses. A virus is a type of germ and can spread from one person to another.  RISKS FACTORS You may be at risk for a URI if:   You smoke.   You have chronic heart or lung disease.  You have a weakened defense (immune) system.   You are very young or very old.   You have nasal allergies or asthma.  You work in crowded or poorly ventilated areas.  You work in health care facilities or schools. SIGNS AND SYMPTOMS  Symptoms typically develop 2-3 days after you come in contact with a cold virus. Most viral URIs last 7-10 days. However, viral URIs from the influenza virus (flu virus) can last 14-18 days and are typically more severe. Symptoms may include:   Runny or stuffy (congested) nose.   Sneezing.   Cough.   Sore throat.   Headache.   Fatigue.   Fever.   Loss of  appetite.   Pain in your forehead, behind your eyes, and over your cheekbones (sinus pain).  Muscle aches.  DIAGNOSIS  Your health care provider may diagnose a URI by:  Physical exam.  Tests to check that your symptoms are not due to another condition such as:  Strep throat.  Sinusitis.  Pneumonia.  Asthma. TREATMENT  A URI goes away on its own with time. It cannot be cured with medicines, but medicines may be prescribed or recommended to relieve symptoms. Medicines may help:  Reduce your fever.  Reduce your cough.  Relieve nasal congestion. HOME CARE INSTRUCTIONS   Take medicines only as directed by your health care provider.   Gargle warm saltwater or take cough drops to comfort your throat as directed by your health care provider.  Use a warm mist humidifier or inhale steam from a shower to increase air moisture. This may make it easier to breathe.  Drink enough fluid to keep your urine clear or pale yellow.   Eat soups and other clear broths and maintain good nutrition.   Rest as needed.   Return to work when your temperature has returned to normal or as your health care provider advises. You may need to stay home longer to avoid infecting others. You can also use a face mask and careful hand washing to prevent spread of the virus.  Increase the usage of your inhaler if you have asthma.   Do not use any tobacco products, including cigarettes, chewing tobacco, or  electronic cigarettes. If you need help quitting, ask your health care provider. PREVENTION  The best way to protect yourself from getting a cold is to practice good hygiene.   Avoid oral or hand contact with people with cold symptoms.   Wash your hands often if contact occurs.  There is no clear evidence that vitamin C, vitamin E, echinacea, or exercise reduces the chance of developing a cold. However, it is always recommended to get plenty of rest, exercise, and practice good nutrition.    SEEK MEDICAL CARE IF:   You are getting worse rather than better.   Your symptoms are not controlled by medicine.   You have chills.  You have worsening shortness of breath.  You have brown or red mucus.  You have yellow or brown nasal discharge.  You have pain in your face, especially when you bend forward.  You have a fever.  You have swollen neck glands.  You have pain while swallowing.  You have white areas in the back of your throat. SEEK IMMEDIATE MEDICAL CARE IF:   You have severe or persistent:  Headache.  Ear pain.  Sinus pain.  Chest pain.  You have chronic lung disease and any of the following:  Wheezing.  Prolonged cough.  Coughing up blood.  A change in your usual mucus.  You have a stiff neck.  You have changes in your:  Vision.  Hearing.  Thinking.  Mood. MAKE SURE YOU:   Understand these instructions.  Will watch your condition.  Will get help right away if you are not doing well or get worse.   This information is not intended to replace advice given to you by your health care provider. Make sure you discuss any questions you have with your health care provider.   Document Released: 11/14/2000 Document Revised: 10/05/2014 Document Reviewed: 08/26/2013 Elsevier Interactive Patient Education 2016 [employer].   Emergency Department Resource Guide 1) Find a Doctor and Pay Out of Pocket Although you won't have to find out who is covered by your insurance plan, it is a good idea to ask around and get recommendations. You will then need to call the office and see if the doctor you have chosen will accept you as a new patient and what types of options they offer for patients who are self-pay. Some doctors offer discounts or will set up payment plans for their patients who do not have insurance, but you will need to ask so you aren't surprised when you get to your appointment.  2) Contact Your Local Health Department Not all  health departments have doctors that can see patients for sick visits, but many do, so it is worth a call to see if yours does. If you don't know where your local health department is, you can check in your phone book. The CDC also has a tool to help you locate your state's health department, and many state websites also have listings of all of their local health departments.  3) Find a Walk-in Clinic If your illness is not likely to be very severe or complicated, you may want to try a walk in clinic. These are popping up all over the country in pharmacies, drugstores, and shopping centers. They're usually staffed by nurse practitioners or physician assistants that have been trained to treat common illnesses and complaints. They're usually fairly quick and inexpensive. However, if you have serious medical issues or chronic medical problems, these are probably not your best option.  No  Primary Care Doctor: - Call Health Connect at  [phone] - they can help you locate a primary care doctor that  accepts your insurance, provides certain services, etc. - Physician Referral Service- [phone]  Chronic Pain Problems: Organization         Address  Phone   Notes  [facility]  [phone] Patients need to be referred by their primary care doctor.   Medication Assistance: Organization         Address  Phone   Notes  [facility] Medication [facility] [address] [place] [place], [place] [address] [phone] --Must be a resident of [facility] -- Must have NO insurance coverage whatsoever (no Medicaid/ Medicare, etc.) -- The pt. MUST have a primary care doctor that directs their care regularly and follows them in the community   [facility]  [phone]   [employer]  [phone]    Agencies that provide inexpensive medical care: Organization         Address  Phone   Notes  [facility]  [phone]   [facility]     [phone]   [facility] [address] [place], [place] [address] [phone]   [facility] [address] [place] [place] [phone]   Planned Parenthood    [phone]   [facility]    [phone]   [facility]  [address], [place] Phone:  [phone], Fax:  [phone] Hours of Operation:  9 am - 6 pm, M-F.  Also accepts Medicaid/Medicare and self-pay.  [facility]  [address], [place] Phone: [phone], Fax: [phone]. Hours of Operation:  8:30 am - 5:30 pm, M-F.  Also accepts Medicaid and self-pay.  [facility] [place] [address] [place] Phone: [phone]   [facility] [address] Natasha Bence [place], [place] [phone], Ext. [phone] Mondays & Thursdays: 7-9 AM.  First 15 patients are seen on a first come, first serve basis.    Medicaid-accepting [facility] Providers:  Organization         Address  Phone   Notes  [facility] [address], [place] [phone] Also accepts self-pay patients.  [facility] [address] Laurell Josephs [place], [place]  [phone]   [facility] [address], [place] [phone]   [facility] [address] [place] [phone]   Renaye Rakers [address], [place]   [phone] Only accepts [place] Access [place] patients after they have their name applied to their card.   Self-Pay (no insurance) in [facility]:  Organization         Address  Phone   Notes  Sickle Cell Patients, [facility] [address] [place], [place] [phone]   [facility]gent Care [address] [place], [place] [phone]   [facility] [place]  [address], [place] [phone]     [facility]/Dr. Osei-Bonsu  [address] [place] or [address], [place] [phone] Phone number for both [place] and [place] locations is the same.  [facility] [address], [place] [phone]  [facility] [address] [place] or [address] [phone]   [facility] [address] [place], [place] [phone], phone; [phone], fax Sees patients 1st and 3rd Saturday of every month.  Must not qualify for public or private insurance (i.e. Medicaid, Medicare, [place] Health Choice, Veterans' Benefits)  Household income should be no more than 200% of the poverty level The clinic cannot treat you if you are pregnant or think you are pregnant  Sexually transmitted diseases are not treated at the clinic.    Dental Care: Organization         Address  Phone  Notes  [facility] Department of [facility] [address] [place], [place] [phone] Accepts children up to age 53 who are enrolled in [place] or [place] Health Choice; pregnant women with a Medicaid card; and children who have applied for Medicaid or [place] Health Choice, but were declined, whose parents can pay a reduced fee at time of service.  [facility] of [facility]  [address], [place] [phone] Accepts children up to age 76 who are enrolled in [place] or [place] Health Choice; pregnant women with a Medicaid card; and children who have applied for Medicaid or [place] Health Choice, but were declined, whose parents can pay a reduced fee at time of service.  [facility]  [address] [place], [place] [phone] Patients are seen by appointment only. Walk-ins are not accepted. [facility] will see patients 7 years of age and older. Monday - Tuesday (8am-5pm) Most Wednesdays (8:30-5pm) $30 per visit,  cash only  [facility]  [address], [facility] [phone] Patients are seen by appointment only. Walk-ins are not accepted. [facility] will see patients 42 years of age and older. One Wednesday Evening (Monthly: Volunteer Based).  $30 per visit, cash only  [employer]  [phone] for adults; Children under age 7, call Graduate Pediatric Dentistry at [phone]. Children aged 58-14, please call [phone] to request a pediatric application.  Dental services are provided in all areas of dental care including fillings, crowns and bridges, complete and partial dentures, implants, gum treatment, root canals, and extractions. Preventive care is also provided. Treatment is provided to both adults and children. Patients are selected via a lottery and there is often a waiting list.   [facility] [address] [place]  [phone] [email or web address]   [facility] [address] [place], [place] [phone], Ext. [phone] Second and Fourth Thursday of each month, opens at 6:30 AM; Clinic ends at 9 AM.  Patients are seen on a first-come first-served basis, and a limited number are seen during each clinic.   [facility]  [address] Ether [place]ins [place], [place] [phone]   Eligibility Requirements You must have lived in [place], [place], or [place] counties for at least the last three months.   You cannot be eligible for state or federal sponsored [place], including [employer], [place], or [employer].   You generally cannot be eligible for healthcare insurance through your employer.    How to apply: Eligibility screenings are held every Tuesday and Wednesday afternoon from 1:00 pm until 4:00 pm. You do not need an appointment for the interview!  [facility] [address], [place], [place] [phone]   Aaron Edelman  [employer] Department   [phone]   [facility] Health Department  [phone]   [facility]  [phone]    Behavioral Health Resources in the Community: Intensive Outpatient Programs Organization         Address  Phone  Notes  [facility] [address] [place], [place] [phone]   [facility] [address] [place], [place] [phone]   [facility] [address], [place], [place]  [phone]   [facility] Mental Health [address],  [place], [place] [phone] or [phone]   Substance Abuse Resources Organization         Address  Phone  Notes  [facility]  [phone]   [facility]  [phone]   [facility]  [phone]   Floydene Flock  [phone]   Residential & Outpatient Substance Abuse Program  [phone]   Psychological Services Organization         Address  Phone  Notes  [facility]  [phone]   [facility]  [phone]   [facility] Mental Health [address], [place] [phone] or [phone]    Mobile Crisis Teams Organization         Address  Phone  Notes  [facility], Mobile Crisis Care Unit  [phone]   [facility]  [address] [place], [place] [phone]   Doristine Locks [address] [place] [phone]    Self-Help/Support Groups Organization         Address  Phone             Notes  [facility] [place] - variety of support groups  336- I7437963 Call for more information  Narcotics Anonymous (NA), [facility] [address], [employer] [place]  2 meetings at this location   Statistician         Address  Phone  Notes  [facility] [address],    [place]  [phone]   [facility]  [address] [place] [address], [place], [place] [phone]    [facility] Treatment Facility [address] [place], [place] [phone] Admissions: 8am-3pm M-F  [facility] [address],    [place], [place] [phone]   [facility] [address] [place], [place], [place] [phone]   [facility] [address]  [place], [place] [phone]   [facility] - Intensive Outpatient [address] Laurell Josephs [address], [place], [place] [phone]   [facility] ([facility] [address] [place].,  [place], [place] [phone] or [phone]   [facility] ([facility]) [address], [place], [place] [phone] Accepts Medicaid  [facility] [address],  [place] [phone] Substance Abuse/Addiction Treatment   [facility] Organization         Address  Phone  Notes  [facility]  [phone]   Angie Fava, PhD [address] Ervin Knack [place], [place]   [phone] or [phone]   [facility] Behavioral   [address] [place], [place] [phone]   [facility] [address] [place], [place] [phone] Insurance/Medicaid/sponsorship through [employer] and Families [address]  [place], [place] [phone] [place], [place] [phone]    Dr. Adele Schilder  [phone]   [facility] Dept. 1) [address] [place] 2) [place] 3)  [address], [place] [phone]  [phone]   [place] [phone] or [phone] (After Hours)

## 2015-08-03 NOTE — ED Provider Notes (Signed)
CSN: [id number]     Arrival date & time 08/03/15  1652 History  By signing my name below, I, Evon Slack, attest that this documentation has been prepared under the direction and in the presence of Tyreak Reagle, PA-C. Electronically Signed: Evon Slack, ED Scribe. 08/03/2015. Marland Kitchen     Chief Complaint  Patient presents with  . Cough   The history is provided by the patient. No language interpreter was used.   HPI Comments: Kristie Tapia is a 45 y.o. female who presents to the Emergency Department complaining of worsening cough onset 4 weeks prior. Pt reports having cough after cleaning out her bird cage. She states there was a thick layer of bird feces she needed to clean. She has been having a productive cough since. She reports associated nasal congestion and rhinorrhea. She states that her nasal discharge and cough are productive of green-yellow sputum/discharge. She states that her cough is worse when laying down. She is also a 30+ year smoker and continues to smoke a half pack per day. She notes that her husband and daughter have developed similar symptoms in the past few weeks. They have not been around the bird cages or helped her clean them. She reports that she was seen at urgent care for the same symptoms 1 week ago. She states she was prescribed an anti-fungal medication but is not able to afford it due to insurance issues so she returns today. She feels that her symptoms have been worsening. She did take a steroid pak with no relief. Denies fevers, chills, headaches, dizziness, syncope, ear pain, eye pain, sore throat, chest pain, SOB, abdominal pain, nausea, vomiting or rashes.    Past Medical History  Diagnosis Date  . Anxiety   . Complication of anesthesia ` 1998    "lungs collapsed during LEEP procedure"  . Sleep apnea   . Type II diabetes mellitus (HCC)   . Anemia   . GERD (gastroesophageal reflux disease)   . H/O hiatal hernia   . History of stomach ulcers   .  Migraines   . Gallstones    Past Surgical History  Procedure Laterality Date  . Appendectomy  01/03/2012  . Abdominal hysterectomy  08/1998  . Dilation and curettage of uterus    . Abdominal exploration surgery      "I've had several"  . Tubal ligation  05/1998  . Leep  1998    "lungs collapsed"  . Laparoscopic appendectomy  01/03/2012    Procedure: APPENDECTOMY LAPAROSCOPIC;  Surgeon: Mariella Saa, MD;  Location: [facility] OR;  Service: General;  Laterality: N/A;   Family History  Problem Relation Age of Onset  . Breast cancer     Social History  Substance Use Topics  . Smoking status: Current Every Day Smoker -- 1.00 packs/day for 29 years    Types: Cigarettes  . Smokeless tobacco: Never Used  . Alcohol Use: Yes     Comment: 01/03/2012 "6pk would last me a year"   OB History    No data available      Review of Systems  Constitutional: Negative for fever.  HENT: Positive for congestion and rhinorrhea.   Respiratory: Positive for cough.   All other systems reviewed and are negative.    Allergies  Adhesive; Sulfa antibiotics; Decongestant; Prednisone; Percocet; Zoloft; and Latex  Home Medications   Prior to Admission medications   Medication Sig Start Date End Date Taking? Authorizing Provider  albuterol (PROVENTIL HFA;VENTOLIN HFA) 108 (90 BASE) MCG/ACT inhaler  Inhale 2 puffs into the lungs every 4 (four) hours as needed for wheezing or shortness of breath.    Historical Provider, MD  amoxicillin (AMOXIL) 500 MG capsule Take 500 mg by mouth 3 (three) times daily. Started medication on 01-11-14 01/11/14   Terri Piedra, PA-C  aspirin-acetaminophen-caffeine (EXCEDRIN MIGRAINE) [phone] MG per tablet Take 1-2 tablets by mouth every 8 (eight) hours as needed. headache    Historical Provider, MD  azithromycin (ZITHROMAX) 250 MG tablet Take 1 tablet (250 mg total) by mouth daily. Take first 2 tablets together, then 1 every day until finished. 08/03/15   Makayla Lanter, PA-C   benzonatate (TESSALON PERLES) 100 MG capsule Take 1-2 capsules (100-200 mg total) by mouth 3 (three) times daily as needed for cough. 07/29/15   Ozella Rocks, MD  BYDUREON 2 MG PEN Inject 2 mg into the skin every 7 (seven) days. 09/16/13   Historical Provider, MD  calcium carbonate (TUMS EX) 750 MG chewable tablet Chew 2-3 tablets by mouth every 4 (four) hours as needed. indigestion    Historical Provider, MD  dexamethasone (DECADRON) 4 MG tablet Take 2.5 tablets (10 mg total) by mouth 2 (two) times daily with a meal. 07/29/15   Ozella Rocks, MD  escitalopram (LEXAPRO) 20 MG tablet Take 20 mg by mouth at bedtime.    Historical Provider, MD  esomeprazole (NEXIUM) 20 MG capsule Take 20 mg by mouth daily.    Historical Provider, MD  furosemide (LASIX) 40 MG tablet Take 40 mg by mouth daily.    Historical Provider, MD  HYDROcodone-acetaminophen (NORCO/VICODIN) 5-325 MG per tablet Take 1-2 tablets by mouth every 6 (six) hours as needed for moderate pain or severe pain. Patient not taking: Reported on 05/11/2014 01/11/14   Toni Amend Forcucci, PA-C  insulin detemir (LEVEMIR) 100 UNIT/ML injection Inject 65 Units into the skin at bedtime.    Historical Provider, MD  Itraconazole 200 MG TABS Take 200 mg by mouth daily. 07/29/15   Ozella Rocks, MD  Liraglutide (VICTOZA) 18 MG/3ML SOPN Inject 1.8 mg into the skin every morning.    Historical Provider, MD  LORazepam (ATIVAN) 1 MG tablet Take 0.5-1 mg by mouth every 8 (eight) hours as needed. anxiety    Historical Provider, MD  Melatonin 5 MG TABS Take 15 mg by mouth at bedtime.    Historical Provider, MD  Multiple Vitamins-Minerals (MULTIVITAMIN PO) Take 1 tablet by mouth daily.    Historical Provider, MD  naproxen sodium (ALEVE) 220 MG tablet Take 220 mg by mouth 2 (two) times daily as needed (headache/pain).     Historical Provider, MD  omega-3 acid ethyl esters (LOVAZA) 1 G capsule Take 2,000 g by mouth 2 (two) times daily. 10/05/13   Historical Provider,  MD  ondansetron (ZOFRAN) 4 MG tablet Take 4 mg by mouth every 6 (six) hours. 10/11/13   Junious Silk, PA-C  promethazine (PHENERGAN) 25 MG tablet Take 25 mg by mouth every 6 (six) hours as needed for nausea or vomiting.    Historical Provider, MD  rizatriptan (MAXALT) 5 MG tablet Take 5 mg by mouth as needed for migraine. May repeat in 2 hours if needed    Historical Provider, MD  tiZANidine (ZANAFLEX) 4 MG capsule Take 4 mg by mouth daily as needed for muscle spasms.    Historical Provider, MD  traMADol (ULTRAM) 50 MG tablet Take 1 tablet (50 mg total) by mouth every 6 (six) hours as needed. Patient not taking: Reported on 05/11/2014 01/16/14  Heather Laisure, PA-C   BP 120/73 mmHg  Pulse 71  Temp(Src) 97.6 F (36.4 C) (Oral)  Resp 18  SpO2 93%   Physical Exam  Constitutional: She appears well-developed and well-nourished. No distress.  Nontoxic appearing. Coughing frequently. No sputum production while in ED.   HENT:  Head: Normocephalic and atraumatic.  Right Ear: Tympanic membrane and ear canal normal.  Left Ear: Tympanic membrane and ear canal normal.  Nose: Rhinorrhea present. Right sinus exhibits maxillary sinus tenderness and frontal sinus tenderness. Left sinus exhibits maxillary sinus tenderness and frontal sinus tenderness.  Mouth/Throat: Uvula is midline and oropharynx is clear and moist. No oropharyngeal exudate.  Eyes: Conjunctivae are normal. Right eye exhibits no discharge. Left eye exhibits no discharge. No scleral icterus.  Neck: Normal range of motion.  Cardiovascular: Normal rate, regular rhythm and normal heart sounds.   No murmur heard. Pulmonary/Chest: Effort normal and breath sounds normal. No respiratory distress. She has no wheezes. She has no rales.  Breathing unlabored. Equal chest expansion. Lungs CTAB.   Abdominal: Soft. There is no tenderness.  Musculoskeletal: Normal range of motion.  Moves all extremities spontaneously and walks with a steady gait.    Neurological: She is alert. Coordination normal.  Skin: Skin is warm and dry. She is not diaphoretic.  Psychiatric: She has a normal mood and affect. Her behavior is normal.  Nursing note and vitals reviewed.   ED Course  Procedures (including critical care time) DIAGNOSTIC STUDIES: Oxygen Saturation is 96% on RA, adequate by my interpretation.    COORDINATION OF CARE: 5:40 PM-Discussed treatment plan with pt at bedside and pt agreed to plan.     Labs Review Labs Reviewed - No data to display  Imaging Review Dg Chest 2 View  08/03/2015  CLINICAL DATA:  Productive cough for 4 weeks. EXAM: CHEST  2 VIEW COMPARISON:  07/29/2015. FINDINGS: Trachea is midline. Heart size normal. Probable mild scarring in the right middle lobe and lingula. Lungs are otherwise clear. No pleural fluid. IMPRESSION: No acute findings. Electronically Signed   By: Leanna Battles M.D.   On: 08/03/2015 18:19   .   EKG Interpretation None      MDM   Final diagnoses:  URI (upper respiratory infection)   45 year old female presenting with productive cough, nasal congestion and rhinorrhea times one month. Previously evaluated at urgent care and discharged with itraconazole for possible fungal infection due to cleaning her birdcages. She was unable to afford this medication and has not taken it. Afebrile and nontoxic appearing. Nasal discharge noted. Diffuse sinus tenderness over bilateral maxillary and frontal sinuses. No oropharyngeal erythema or exudate. Lungs clear to auscultation bilaterally. No respiratory distress. Chest x-ray unchanged from last week. Reviewed labs drawn at urgent care. Negative histoplasmosis and Coccidioides antigen. Given lab results and unchanged x-ray, unlikely to be fungal infection. Will treat with azithromycin for upper respiratory infection; possibly sinusitis given sinus tenderness. Instructed to follow-up with her PCP if symptoms do not improve. She states she has a PCP that is  currently looking for a new one. Given resource guide. Return precautions given in discharge paperwork and discussed with pt at bedside. Pt stable for discharge  I personally performed the services described in this documentation, which was scribed in my presence. The recorded information has been reviewed and is accurate.     Alveta Heimlich, PA-C 08/03/15 1907  Arby Barrette, MD 08/03/15 1924

## 2015-08-03 NOTE — ED Notes (Signed)
Declined W/C at D/C and was escorted to lobby by RN. 

## 2015-08-06 ENCOUNTER — Telehealth (HOSPITAL_COMMUNITY): Payer: Self-pay | Admitting: Emergency Medicine

## 2015-08-06 NOTE — ED Notes (Signed)
x1 attempt  LM on pt's VM [phone] Need to give lab results from recent visit on 2/24  Per Dr. Dayton ScrapeMurray,  Please let patient know that tests for fungal pneumonia (histoplasmosis, cocciodosis) were negative. Re[facility] rx [facility]raconazole and [facility] ([facility]adron[facility]. Ok to stop itraconazole.  Received rx zithromax on 08/03/15; continue zithromax. Recheck or followup pcp for persistent symptoms. LM  Will try later.

## 2015-08-29 ENCOUNTER — Ambulatory Visit (INDEPENDENT_AMBULATORY_CARE_PROVIDER_SITE_OTHER): Payer: BLUE CROSS/BLUE SHIELD

## 2015-08-29 ENCOUNTER — Ambulatory Visit (INDEPENDENT_AMBULATORY_CARE_PROVIDER_SITE_OTHER): Payer: BLUE CROSS/BLUE SHIELD | Admitting: Family Medicine

## 2015-08-29 VITALS — BP 132/80 | HR 88 | Temp 97.7°F | Resp 20 | Ht 65.35 in | Wt 159.6 lb

## 2015-08-29 DIAGNOSIS — R059 Cough, unspecified: Secondary | ICD-10-CM

## 2015-08-29 DIAGNOSIS — J0101 Acute recurrent maxillary sinusitis: Secondary | ICD-10-CM

## 2015-08-29 DIAGNOSIS — M25552 Pain in left hip: Secondary | ICD-10-CM

## 2015-08-29 DIAGNOSIS — E119 Type 2 diabetes mellitus without complications: Secondary | ICD-10-CM

## 2015-08-29 DIAGNOSIS — Z720 Tobacco use: Secondary | ICD-10-CM | POA: Diagnosis not present

## 2015-08-29 DIAGNOSIS — Z794 Long term (current) use of insulin: Secondary | ICD-10-CM

## 2015-08-29 DIAGNOSIS — R05 Cough: Secondary | ICD-10-CM

## 2015-08-29 LAB — POCT CBC
GRANULOCYTE PERCENT: 69.9 % (ref 37–80)
HCT, POC: 43.3 % (ref 37.7–47.9)
Hemoglobin: 15.3 g/dL (ref 12.2–16.2)
Lymph, poc: 2.7 (ref 0.6–3.4)
MCH: 32 pg — AB (ref 27–31.2)
MCHC: 35.4 g/dL (ref 31.8–35.4)
MCV: 90.4 fL (ref 80–97)
MID (cbc): 0.5 (ref 0–0.9)
MPV: 7.4 fL (ref 0–99.8)
POC Granulocyte: 7.4 — AB (ref 2–6.9)
POC LYMPH %: 25.8 % (ref 10–50)
POC MID %: 4.3 % (ref 0–12)
Platelet Count, POC: 348 10*3/uL (ref 142–424)
RBC: 4.78 M/uL (ref 4.04–5.48)
RDW, POC: 15.1 %
WBC: 10.6 10*3/uL — AB (ref 4.6–10.2)

## 2015-08-29 LAB — COMPREHENSIVE METABOLIC PANEL
ALK PHOS: 87 U/L (ref 33–115)
ALT: 14 U/L (ref 6–29)
AST: 14 U/L (ref 10–30)
Albumin: 4.4 g/dL (ref 3.6–5.1)
BILIRUBIN TOTAL: 0.6 mg/dL (ref 0.2–1.2)
BUN: 4 mg/dL — AB (ref 7–25)
CO2: 28 mmol/L (ref 20–31)
CREATININE: 0.73 mg/dL (ref 0.50–1.10)
Calcium: 9.3 mg/dL (ref 8.6–10.2)
Chloride: 91 mmol/L — ABNORMAL LOW (ref 98–110)
GLUCOSE: 447 mg/dL — AB (ref 65–99)
Potassium: 4 mmol/L (ref 3.5–5.3)
SODIUM: 128 mmol/L — AB (ref 135–146)
TOTAL PROTEIN: 6.7 g/dL (ref 6.1–8.1)

## 2015-08-29 LAB — POCT URINALYSIS DIP (MANUAL ENTRY)
BILIRUBIN UA: NEGATIVE
Bilirubin, UA: NEGATIVE
Glucose, UA: >=1000 — AB
Leukocytes, UA: NEGATIVE
NITRITE UA: NEGATIVE
PH UA: 5
PROTEIN UA: NEGATIVE
Spec Grav, UA: <=1.005
Urobilinogen, UA: 0.2

## 2015-08-29 LAB — POCT GLYCOSYLATED HEMOGLOBIN (HGB A1C): Hemoglobin A1C: >14

## 2015-08-29 LAB — GLUCOSE, POCT (MANUAL RESULT ENTRY): POC Glucose: >444 mg/dl (ref 70–99)

## 2015-08-29 LAB — TSH: TSH: 1.3 m[IU]/L

## 2015-08-29 LAB — POCT SEDIMENTATION RATE: POCT SED RATE: 12 mm/hr (ref 0–22)

## 2015-08-29 MED ORDER — DICLOFENAC SODIUM 1 % TD GEL: 4.0000 g | Freq: Four times a day (QID) | TRANSDERMAL | Status: DC

## 2015-08-29 MED ORDER — ESCITALOPRAM OXALATE 20 MG PO TABS: 20.0000 mg | ORAL_TABLET | Freq: Every day | ORAL | Status: DC

## 2015-08-29 MED ORDER — AMOXICILLIN 500 MG PO TABS: 1000.0000 mg | ORAL_TABLET | Freq: Two times a day (BID) | ORAL | Status: DC

## 2015-08-29 MED ORDER — FLUTICASONE PROPIONATE 50 MCG/ACT NA SUSP: 2.0000 | Freq: Every day | NASAL | Status: DC

## 2015-08-29 NOTE — Patient Instructions (Addendum)
   IF you received an x-ray today, you will receive an invoice from [facility]. Please contact [facility] at [phone] with questions or concerns regarding your invoice.   IF you received labwork today, you will receive an invoice from [facility]. Please contact [facility] at [phone] with questions or concerns regarding your invoice.   Our billing staff will not be able to assist you with questions regarding bills from these companies.  You will be contacted with the lab results as soon as they are available. The fastest way to get your results is to activate your My Chart account. Instructions are located on the last page of this paperwork. If you have not heard from us regarding the results in 2 weeks, please contact this office.     Trochanteric Bursitis You have hip pain due to trochanteric bursitis. Bursitis means that the sack near the outside of the hip is filled with fluid and inflamed. This sack is made up of protective soft tissue. The pain from trochanteric bursitis can be severe and keep you from sleep. It can radiate to the buttocks or down the outside of the thigh to the knee. The pain is almost always worse when rising from the seated or lying position and with walking. Pain can improve after you take a few steps. It happens more often in people with hip joint and lumbar spine problems, such as arthritis or previous surgery. Very rarely the trochanteric bursa can become infected, and antibiotics and/or surgery may be needed. Treatment often includes an injection of local anesthetic mixed with cortisone medicine. This medicine is injected into the area where it is most tender over the hip. Repeat injections may be necessary if the response to treatment is slow. You can apply ice packs over the tender area for 30 minutes every 2 hours for the next few days. Anti-inflammatory and/or narcotic pain medicine may also be helpful. Limit  your activity for the next few days if the pain continues. See your caregiver in 5-10 days if you are not greatly improved.  SEEK IMMEDIATE MEDICAL CARE IF:  You develop severe pain, fever, or increased redness.  You have pain that radiates below the knee. EXERCISES STRETCHING EXERCISES - Trochanteric Bursitis  These exercises may help you when beginning to rehabilitate your injury. Your symptoms may resolve with or without further involvement from your physician, physical therapist, or athletic trainer. While completing these exercises, remember:   Restoring tissue flexibility helps normal motion to return to the joints. This allows healthier, less painful movement and activity.  An effective stretch should be held for at least 30 seconds.  A stretch should never be painful. You should only feel a gentle lengthening or release in the stretched tissue. STRETCH - Iliotibial Band  On the floor or bed, lie on your side so your injured leg is on top. Bend your knee and grab your ankle.  Slowly bring your knee back so that your thigh is in line with your trunk. Keep your heel at your buttocks and gently arch your back so your head, shoulders and hips line up.  Slowly lower your leg so that your knee approaches the floor/bed until you feel a gentle stretch on the outside of your thigh. If you do not feel a stretch and your knee will not fall farther, place the heel of your opposite foot on top of your knee and pull your thigh down farther.  Hold this stretch for __________ seconds.    Repeat __________ times. Complete this exercise __________ times per day. STRETCH - Hamstrings, Supine   Lie on your back. Loop a belt or towel over the ball of your foot as shown.  Straighten your knee and slowly pull on the belt to raise your injured leg. Do not allow the knee to bend. Keep your opposite leg flat on the floor.  Raise the leg until you feel a gentle stretch behind your knee or thigh. Hold this  position for __________ seconds.  Repeat __________ times. Complete this stretch __________ times per day. STRETCH - Quadriceps, Prone   Lie on your stomach on a firm surface, such as a bed or padded floor.  Bend your knee and grasp your ankle. If you are unable to reach your ankle or pant leg, use a belt around your foot to lengthen your reach.  Gently pull your heel toward your buttocks. Your knee should not slide out to the side. You should feel a stretch in the front of your thigh and/or knee.  Hold this position for __________ seconds.  Repeat __________ times. Complete this stretch __________ times per day. STRETCHING - Hip Flexors, Lunge Half kneel with your knee on the floor and your opposite knee bent and directly over your ankle.  Keep good posture with your head over your shoulders. Tighten your buttocks to point your tailbone downward; this will prevent your back from arching too much.  You should feel a gentle stretch in the front of your thigh and/or hip. If you do not feel any resistance, slightly slide your opposite foot forward and then slowly lunge forward so your knee once again lines up over your ankle. Be sure your tailbone remains pointed downward.  Hold this stretch for __________ seconds.  Repeat __________ times. Complete this stretch __________ times per day. STRETCH - Adductors, Lunge  While standing, spread your legs.  Lean away from your injured leg by bending your opposite knee. You may rest your hands on your thigh for balance.  You should feel a stretch in your inner thigh. Hold for __________ seconds.  Repeat __________ times. Complete this exercise __________ times per day.   This information is not intended to replace advice given to you by your health care provider. Make sure you discuss any questions you have with your health care provider.   Document Released: 06/28/2004 Document Revised: 10/05/2014 Document Reviewed: 09/02/2008 [employer]  Inter[employer] Patient Education 2016 [employer]  

## 2015-08-29 NOTE — Progress Notes (Signed)
Subjective:    Patient ID: Kristie Tapia, female    DOB: [birth year], 45 y.o.   MRN: [id number]  08/29/2015  Cough and Hip Pain   HPI This 45 y.o. female presents for evaluation of persistent cough for two months.  Has four dogs, ten cats, 3 cockatoos.  C[facility] is better since [facility], yet air is much cleaner.  Yesterday went into cockatoos rooms.  Also has chickens that are outside.  Went in there yesterday; able to feed one cage and take one feeder out; vomited with coughing; large cough of foam.  Greatly improved with the Zpack.  Absolutely exhausted.  9/10 times checks sugars it registers HIGH.  Fatigue really just started in past week.  Sugars have been high.  Also drinks water.  Also drinks probiotic fruit with peaches.  Doesn't eat right; if eats, won't be able to leave the house due to diarrhea.     Evaluated by ED on 08/03/2015;  Onset after cleaning out bird cage; bird feces cleaned from cage.  +nassl congestion and rhinorrhea; green yellow sputum; worse with laying supine.  30+ year smoker; husband and daughter with similar symptoms.  Prior Urgent Care visit one week prior to ED visit; s/p steroid pack; did not fill antifungal. S/p CXR x 2; negative histoplasmosis and Coccidiodes antigen; treated with Azithrmycin.      L hip/thigh pain: only hurts with weight-bearing/walking.  Had surgery on foot in October 2016.  Has lost 30 pounds since surgery.  Not trying to lose weight yet did. Has not returned to work yet.  [place] [employer] [place].  No formal exercise.  Has not been taking insulin but hesitating to take insulin.  All bones hurt; forearms, shins hurt.  No rheumatology; referred to rheumatology but then killed insurance.  Now has insurance 08/03/2015.  Has been dealing with cough since 08/03/2015.  Has been going to the same doctor since July 2013.   Keep throwing pills at patient that is not helping.  Orthopedic is [facility]; followed by Dr. Mayra ReelLandaue; L elbow injury and L shoulder  injury.  S/p MRI shoulder.    When scratches, skin burns at site of scratching.  Multiple joint pains.  Tired of hurting.    Anxiety: has glorified anxiety; lorazepam for panic attacks.  Taking Lexapro 20mg  daily.  Started with 10mg  and increased to 20mg ; now has cut down to 10mg  daily.  Not sure why Lexapro was decreased.  Anxiety has increased.  Taking Lorazepam for over ten years.    Tremors: having tremors in past month; usually occurs when smoking.    DMII: not takign Levemir; does take regular insulin; taking victoza 0.6mg .  Has decreased Dr. Reino KentPepper.  Drinking too many Dr. Reino KentPepper 2 two liters per day.  Drinking a lot of water.  Regular insulin when sugar gets really really high and starts feeling worse.  Levemir 72 units prescribed; has not taken Levemir.  Cannot take diet Dr. Reino KentPepper.  Will start having bloody stools with diet Dr. Reino KentPepper.     Review of Systems  Constitutional: Negative for fever, chills, diaphoresis and fatigue.  HENT: Negative for ear pain, postnasal drip, rhinorrhea, sinus pressure, sore throat and trouble swallowing.   Respiratory: Negative for cough and shortness of breath.   Cardiovascular: Negative for chest pain, palpitations and leg swelling.  Gastrointestinal: Negative for nausea, vomiting, abdominal pain, diarrhea and constipation.    Past Medical History  Diagnosis Date  . Anxiety   . Complication  of anesthesia ` 1998    "lungs collapsed during LEEP procedure"  . Sleep apnea   . Type II diabetes mellitus (HCC)   . Anemia   . GERD (gastroesophageal reflux disease)   . H/O hiatal hernia   . History of stomach ulcers   . Migraines   . Gallstones    Past Surgical History  Procedure Laterality Date  . Appendectomy  01/03/2012  . Abdominal hysterectomy  08/1998  . Dilation and curettage of uterus    . Abdominal exploration surgery      "I've had several"  . Tubal ligation  05/1998  . Leep  1998    "lungs collapsed"  . Laparoscopic appendectomy   01/03/2012    Procedure: APPENDECTOMY LAPAROSCOPIC;  Surgeon: Mariella Saa, MD;  Location: [facility] OR;  Service: General;  Laterality: N/A;   Allergies  Allergen Reactions  . Adhesive [Tape] Other (See Comments)    Blisters USE PAPER TAPE ONLY  . Sulfa Antibiotics Other (See Comments)    Burns the inside of my mouth; "blisters; leaves tongue and inside of mouth solid red"  . Decongestant [Pseudoephedrine Hcl Er] Itching    ALL DECONGESTANTS  . Prednisone Other (See Comments)    Gives me a bad attitude  . Percocet [Oxycodone-Acetaminophen] Other (See Comments)    PT reports having nightmares when taking Percocet  . Zoloft [Sertraline Hcl]   . Latex Itching    Social History   Social History  . Marital Status: Single    Spouse Name: N/A  . Number of Children: N/A  . Years of Education: N/A   Occupational History  . Not on file.   Social History Main Topics  . Smoking status: Current Every Day Smoker -- 1.00 packs/day for 29 years    Types: Cigarettes  . Smokeless tobacco: Never Used  . Alcohol Use: Yes     Comment: 01/03/2012 "6pk would last me a year"  . Drug Use: No  . Sexual Activity: Yes   Other Topics Concern  . Not on file   Social History Narrative   Family History  Problem Relation Age of Onset  . Breast cancer         Objective:    BP 132/80 mmHg  Pulse 88  Temp(Src) 97.7 F (36.5 C) (Oral)  Resp 20  Ht 5' 5.35" (1.66 m)  Wt 159 lb 9.6 oz (72.394 kg)  BMI 26.27 kg/m2  SpO2 97% Physical Exam  Constitutional: She is oriented to person, place, and time. She appears well-developed and well-nourished. No distress.  HENT:  Head: Normocephalic and atraumatic.  Eyes: Conjunctivae are normal. Pupils are equal, round, and reactive to light.  Neck: Normal range of motion. Neck supple.  Cardiovascular: Normal rate, regular rhythm and normal heart sounds.  Exam reveals no gallop and no friction rub.   No murmur heard. Pulmonary/Chest: Effort normal and  breath sounds normal. She has no wheezes. She has no rales.  Musculoskeletal:       Left hip: She exhibits tenderness and bony tenderness. She exhibits normal range of motion and normal strength.  +TTP L lateral hip at greater trochanteric bursa.   Neurological: She is alert and oriented to person, place, and time.  Skin: She is not diaphoretic.  Psychiatric: She has a normal mood and affect. Her behavior is normal.  Nursing note and vitals reviewed.   No results found.     Assessment & Plan:   1. Left hip pain   2.  Cough   3. Acute recurrent maxillary sinusitis   4. Tobacco abuse   5. Type 2 diabetes mellitus without complication, with long-term current use of insulin (HCC)     Orders Placed This Encounter  Procedures  . DG HIP UNILAT W OR W/O PELVIS 2-3 VIEWS LEFT    Standing Status: Future     Number of Occurrences: 1     Standing Expiration Date: 08/28/2016    Order Specific Question:  Reason for Exam (SYMPTOM  OR DIAGNOSIS REQUIRED)    Answer:  L hip pain for one day    Order Specific Question:  Is the patient pregnant?    Answer:  No    Order Specific Question:  Preferred imaging location?    Answer:  External  . Comprehensive metabolic panel  . TSH  . POCT CBC  . POCT glucose (manual entry)  . POCT glycosylated hemoglobin (Hb A1C)  . POCT urinalysis dipstick  . POCT SEDIMENTATION RATE   Meds ordered this encounter  Medications  . fenofibrate 54 MG tablet    Sig: Take 54 mg by mouth daily.  . methocarbamol (ROBAXIN) 500 MG tablet    Sig: Take 500 mg by mouth 4 (four) times daily.  . traZODone (DESYREL) 100 MG tablet    Sig: Take 100 mg by mouth at bedtime.  Marland Kitchen escitalopram (LEXAPRO) 20 MG tablet    Sig: Take 1 tablet (20 mg total) by mouth daily.    Dispense:  30 tablet    Refill:  5  . amoxicillin (AMOXIL) 500 MG tablet    Sig: Take 2 tablets (1,000 mg total) by mouth 2 (two) times daily.    Dispense:  40 tablet    Refill:  0  . diclofenac sodium  (VOLTAREN) 1 % GEL    Sig: Apply 4 g topically 4 (four) times daily.    Dispense:  100 g    Refill:  2  . fluticasone (FLONASE) 50 MCG/ACT nasal spray    Sig: Place 2 sprays into both nostrils daily.    Dispense:  16 g    Refill:  11    Return in about 3 months (around 11/29/2015).    Franshesca Chipman Paulita Fujita, M.D. [facility] [address] [place], [place]  [address] [phone] phone [phone] fax

## 2015-08-30 ENCOUNTER — Telehealth: Payer: Self-pay | Admitting: *Deleted

## 2015-08-30 NOTE — Telephone Encounter (Signed)
[facility] called about pts glucose was 447

## 2015-08-31 NOTE — Telephone Encounter (Signed)
Reviewed and noted.

## 2015-09-01 ENCOUNTER — Telehealth: Payer: Self-pay

## 2015-09-01 NOTE — Telephone Encounter (Signed)
PA needed for diclofenac gel 1%. Looked back in hx and found that pt has hx of stomach ulcers, which puts her at higher risk for GI bleed w/oral NSAIDS. Completed PA on covermymeds. Pending.

## 2015-09-02 NOTE — Telephone Encounter (Signed)
PA approved through 06/03/2038. Notified pharm. 

## 2015-09-15 ENCOUNTER — Encounter: Payer: Self-pay | Admitting: Family Medicine

## 2015-09-21 ENCOUNTER — Ambulatory Visit (INDEPENDENT_AMBULATORY_CARE_PROVIDER_SITE_OTHER): Payer: BLUE CROSS/BLUE SHIELD | Admitting: Emergency Medicine

## 2015-09-21 VITALS — BP 130/70 | HR 57 | Temp 97.8°F | Resp 14 | Ht 65.25 in | Wt 164.0 lb

## 2015-09-21 DIAGNOSIS — G4700 Insomnia, unspecified: Secondary | ICD-10-CM | POA: Diagnosis not present

## 2015-09-21 DIAGNOSIS — Z794 Long term (current) use of insulin: Secondary | ICD-10-CM

## 2015-09-21 DIAGNOSIS — R110 Nausea: Secondary | ICD-10-CM

## 2015-09-21 DIAGNOSIS — S91302A Unspecified open wound, left foot, initial encounter: Secondary | ICD-10-CM | POA: Diagnosis not present

## 2015-09-21 DIAGNOSIS — M2550 Pain in unspecified joint: Secondary | ICD-10-CM

## 2015-09-21 DIAGNOSIS — E119 Type 2 diabetes mellitus without complications: Secondary | ICD-10-CM

## 2015-09-21 LAB — GLUCOSE, POCT (MANUAL RESULT ENTRY): POC GLUCOSE: 266 mg/dL — AB (ref 70–99)

## 2015-09-21 MED ORDER — ALBUTEROL SULFATE HFA 108 (90 BASE) MCG/ACT IN AERS: 2.0000 | INHALATION_SPRAY | RESPIRATORY_TRACT | Status: DC | PRN

## 2015-09-21 MED ORDER — TRAZODONE HCL 100 MG PO TABS: 100.0000 mg | ORAL_TABLET | Freq: Every day | ORAL | Status: DC

## 2015-09-21 MED ORDER — PROMETHAZINE HCL 25 MG PO TABS: 25.0000 mg | ORAL_TABLET | Freq: Four times a day (QID) | ORAL | Status: DC | PRN

## 2015-09-21 NOTE — Patient Instructions (Signed)
     IF you received an x-ray today, you will receive an invoice from [facility]. Please contact [facility] at [phone] with questions or concerns regarding your invoice.   IF you received labwork today, you will receive an invoice from [facility]. Please contact [facility] at [phone] with questions or concerns regarding your invoice.   Our billing staff will not be able to assist you with questions regarding bills from these companies.  You will be contacted with the lab results as soon as they are available. The fastest way to get your results is to activate your My Chart account. Instructions are located on the last page of this paperwork. If you have not heard from us regarding the results in 2 weeks, please contact this office.      

## 2015-09-21 NOTE — Progress Notes (Signed)
Patient ID: Kristie Tapia, female   DOB: 1970-12-17, 45 y.o.   MRN: [id number]    By signing my name below, I, Essence Howell, attest that this documentation has been prepared under the direction and in the presence of Collene Gobble, MD Electronically Signed: Charline Bills, ED Scribe 09/21/2015 at 11:56 AM.  Chief Complaint:  Chief Complaint  Patient presents with  . Glass in foot    Left foot x 3 days, Pt is diabetic   . Nausea    x 2 days  . Medication Refill    albuterol inhaler, trazodone, phenergran   HPI: Kristie Tapia is a 44 y.o. female, with a h/o DM, who reports to [facility] today complaining of a left foot injury sustained 3 days ago. Pt states that she stepped on a glass spoon made from a wine bottle 3 days ago and suspects that a piece is still stuck in her left foot.   Nausea Pt reports persistent nausea for the past 2 days. She states that she has been out of phenergan for the past few days. She is also taking Nexium for GERD. Pt reports 3 stomach ulcers for the past 20+ years. Pshx of appendectomy, hysterectomy and cholecystomy.   Medication Refill Pt requests a medication refill of Phenergan, Trazodone and albuterol inhaler at this visit.   Diabetes  She is followed by Dr. Katrinka Blazing for DM; next appointment in 1 month. Pt states that she checked her blood glucose this morning with a reading of 215.  Past Medical History  Diagnosis Date  . Anxiety   . Complication of anesthesia ` 1998    "lungs collapsed during LEEP procedure"  . Sleep apnea   . Type II diabetes mellitus (HCC)   . Anemia   . GERD (gastroesophageal reflux disease)   . H/O hiatal hernia   . History of stomach ulcers   . Migraines   . Gallstones    Past Surgical History  Procedure Laterality Date  . Appendectomy  01/03/2012  . Abdominal hysterectomy  08/1998  . Dilation and curettage of uterus    . Abdominal exploration surgery      "I've had several"  . Tubal ligation  05/1998  . Leep  1998   "lungs collapsed"  . Laparoscopic appendectomy  01/03/2012    Procedure: APPENDECTOMY LAPAROSCOPIC;  Surgeon: Mariella Saa, MD;  Location: [facility] OR;  Service: General;  Laterality: N/A;   Social History   Social History  . Marital Status: Single    Spouse Name: N/A  . Number of Children: N/A  . Years of Education: N/A   Social History Main Topics  . Smoking status: Current Every Day Smoker -- 1.00 packs/day for 29 years    Types: Cigarettes  . Smokeless tobacco: Never Used  . Alcohol Use: Yes     Comment: 01/03/2012 "6pk would last me a year"  . Drug Use: No  . Sexual Activity: Yes   Other Topics Concern  . None   Social History Narrative   Family History  Problem Relation Age of Onset  . Breast cancer     Allergies  Allergen Reactions  . Adhesive [Tape] Other (See Comments)    Blisters USE PAPER TAPE ONLY  . Sulfa Antibiotics Other (See Comments)    Burns the inside of my mouth; "blisters; leaves tongue and inside of mouth solid red"  . Decongestant [Pseudoephedrine Hcl Er] Itching    ALL DECONGESTANTS  . Prednisone Other (See Comments)  Gives me a bad attitude  . Percocet [Oxycodone-Acetaminophen] Other (See Comments)    PT reports having nightmares when taking Percocet  . Zoloft [Sertraline Hcl]   . Latex Itching   Prior to Admission medications   Medication Sig Start Date End Date Taking? Authorizing Provider  dicyclomine (BENTYL) 10 MG capsule Take 10 mg by mouth 4 (four) times daily as needed for spasms.   Yes Historical Provider, MD  escitalopram (LEXAPRO) 20 MG tablet Take 1 tablet (20 mg total) by mouth daily. 08/29/15  Yes Ethelda Chick, MD  esomeprazole (NEXIUM) 20 MG capsule Take 20 mg by mouth daily.   Yes Historical Provider, MD  fenofibrate 54 MG tablet Take 54 mg by mouth daily.   Yes Historical Provider, MD  fluticasone (FLONASE) 50 MCG/ACT nasal spray Place 2 sprays into both nostrils daily. 08/29/15  Yes Ethelda Chick, MD  insulin detemir  (LEVEMIR) 100 UNIT/ML injection Inject 65 Units into the skin at bedtime.   Yes Historical Provider, MD  Lactobacillus (ACIDOPHILUS PO) Take by mouth.   Yes Historical Provider, MD  Liraglutide (VICTOZA) 18 MG/3ML SOPN Inject 1.8 mg into the skin every morning. Reported on 09/21/2015   Yes Historical Provider, MD  LORazepam (ATIVAN) 1 MG tablet Take 0.5-1 mg by mouth every 8 (eight) hours as needed. anxiety   Yes Historical Provider, MD  Probiotic Product (PROBIOTIC MULTI-ENZYME PO) Take by mouth.   Yes Historical Provider, MD  albuterol (PROVENTIL HFA;VENTOLIN HFA) 108 (90 BASE) MCG/ACT inhaler Inhale 2 puffs into the lungs every 4 (four) hours as needed for wheezing or shortness of breath. Reported on 09/21/2015    Historical Provider, MD  BYDUREON 2 MG PEN Inject 2 mg into the skin every 7 (seven) days. Reported on 09/21/2015 09/16/13   Historical Provider, MD  diclofenac sodium (VOLTAREN) 1 % GEL Apply 4 g topically 4 (four) times daily. Patient not taking: Reported on 09/21/2015 08/29/15   Ethelda Chick, MD  furosemide (LASIX) 40 MG tablet Take 40 mg by mouth daily. Reported on 09/21/2015    Historical Provider, MD  methocarbamol (ROBAXIN) 500 MG tablet Take 500 mg by mouth 4 (four) times daily. Reported on 09/21/2015    Historical Provider, MD  promethazine (PHENERGAN) 25 MG tablet Take 25 mg by mouth every 6 (six) hours as needed for nausea or vomiting. Reported on 09/21/2015    Historical Provider, MD  rizatriptan (MAXALT) 5 MG tablet Take 5 mg by mouth as needed for migraine. Reported on 09/21/2015    Historical Provider, MD  tiZANidine (ZANAFLEX) 4 MG capsule Take 4 mg by mouth daily as needed for muscle spasms. Reported on 09/21/2015    Historical Provider, MD  traZODone (DESYREL) 100 MG tablet Take 100 mg by mouth at bedtime. Reported on 09/21/2015    Historical Provider, MD   ROS: The patient denies fevers, chills, night sweats, unintentional weight loss, chest pain, palpitations, wheezing, dyspnea  on exertion, nausea, vomiting, abdominal pain, dysuria, hematuria, melena, numbness, weakness, or tingling.   All other systems have been reviewed and were otherwise negative with the exception of those mentioned in the HPI and as above.    PHYSICAL EXAM: Filed Vitals:   09/21/15 1043  BP: 130/70  Pulse: 57  Temp: 97.8 F (36.6 C)  Resp: 14   Body mass index is 27.09 kg/(m^2).   General: Alert, no acute distress HEENT:  Normocephalic, atraumatic, oropharynx patent. Eye: [facility] Cardiovascular:  Regular rate and rhythm, no rubs murmurs  or gallops.  No Carotid bruits, radial pulse intact. No pedal edema.  Respiratory: Clear to auscultation bilaterally.  No wheezes, rales, or rhonchi.  No cyanosis, no use of accessory musculature Abdominal: No organomegaly, abdomen is soft. Positive bowel sounds. No masses. Multiple surgical scars. Minimal diffuse abdominal tenderness. No rebound Musculoskeletal: No edema. Extremities: 3 mm superficial laceration plantar surface left foot Skin: No rashes.  Neurologic: Facial musculature symmetric. Psychiatric: Patient acts appropriately throughout our interaction. Lymphatic: No cervical or submandibular lymphadenopathy Meds ordered this encounter  Medications  . Lactobacillus (ACIDOPHILUS PO)    Sig: Take by mouth.  . Probiotic Product (PROBIOTIC MULTI-ENZYME PO)    Sig: Take by mouth.  . dicyclomine (BENTYL) 10 MG capsule    Sig: Take 10 mg by mouth 4 (four) times daily as needed for spasms.  . promethazine (PHENERGAN) 25 MG tablet    Sig: Take 1 tablet (25 mg total) by mouth every 6 (six) hours as needed for nausea or vomiting. Reported on 09/21/2015    Dispense:  30 tablet    Refill:  0  . albuterol (PROVENTIL HFA;VENTOLIN HFA) 108 (90 Base) MCG/ACT inhaler    Sig: Inhale 2 puffs into the lungs every 4 (four) hours as needed for wheezing or shortness of breath. Reported on 09/21/2015    Dispense:  1 Inhaler    Refill:  3  . traZODone  (DESYREL) 100 MG tablet    Sig: Take 1 tablet (100 mg total) by mouth at bedtime. Reported on 09/21/2015    Dispense:  30 tablet    Refill:  11   LABS: Results for orders placed or performed in visit on 09/21/15  POCT glucose (manual entry)  Result Value Ref Range   POC Glucose 266 (A) 70 - 99 mg/dl    EKG/XRAY:   Primary read interpreted by [facility] Cle[facility]aub [facility].  ASSESSMENT/PLAN: /Art removed from her foot. I did refill her trazodone for sleep her albuterol she uses for asthma as well as Phenergan she takes for nausea. She states last time she saw Dr. Katrinka BlazingSmith she was going to refer her to rheumatology but that order was not placed. I did go ahead and make an appointment for her to see GI to evaluate for delayed gastric empty and related to her diabetes and autonomic dysfunction.I personally performed the services described in this documentation, which was scribed in my presence. The recorded information has been reviewed and is accurate.    Gross sideeffects, risk and benefits, and alternatives of medications d/w patient. Patient is aware that all medications have potential sideeffects and we are unable to predict every sideeffect or drug-drug interaction that may occur.  Lesle ChrisSteven Daub MD 09/21/2015 11:16 AM

## 2015-11-22 ENCOUNTER — Ambulatory Visit (INDEPENDENT_AMBULATORY_CARE_PROVIDER_SITE_OTHER): Payer: BLUE CROSS/BLUE SHIELD | Admitting: Family Medicine

## 2015-11-22 VITALS — BP 118/80 | HR 92 | Temp 97.8°F | Resp 16 | Ht 65.0 in | Wt 169.4 lb

## 2015-11-22 DIAGNOSIS — F411 Generalized anxiety disorder: Secondary | ICD-10-CM

## 2015-11-22 DIAGNOSIS — G43719 Chronic migraine without aura, intractable, without status migrainosus: Secondary | ICD-10-CM

## 2015-11-22 DIAGNOSIS — R110 Nausea: Secondary | ICD-10-CM | POA: Diagnosis not present

## 2015-11-22 DIAGNOSIS — K219 Gastro-esophageal reflux disease without esophagitis: Secondary | ICD-10-CM

## 2015-11-22 MED ORDER — LORAZEPAM 1 MG PO TABS: 0.5000 mg | ORAL_TABLET | Freq: Three times a day (TID) | ORAL | Status: DC | PRN

## 2015-11-22 MED ORDER — ESOMEPRAZOLE MAGNESIUM 20 MG PO CPDR: 20.0000 mg | DELAYED_RELEASE_CAPSULE | Freq: Every day | ORAL | Status: DC

## 2015-11-22 MED ORDER — PROMETHAZINE HCL 25 MG PO TABS: 25.0000 mg | ORAL_TABLET | Freq: Four times a day (QID) | ORAL | Status: DC | PRN

## 2015-11-22 MED ORDER — SUMATRIPTAN SUCCINATE 100 MG PO TABS: 100.0000 mg | ORAL_TABLET | ORAL | Status: DC | PRN

## 2015-11-22 MED ORDER — TOPIRAMATE 50 MG PO TABS
50.0000 mg | ORAL_TABLET | Freq: Every day | ORAL | Status: DC
Start: 2015-11-22 — End: 2016-02-17

## 2015-11-22 NOTE — Progress Notes (Signed)
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 She's in today because of a headache for the last 4 days and ask at the location. She has chronic migraines, and she feels that this is related. She has scalp tenderness in the occiput. She's had some nausea and vomiting. She's tried Excedrin and muscle relaxers without success. She's also tried warm showers and relaxation.  Patient is currently out on Workmen's Comp. after a crush injury to her left foot. She had surgery [facility].  Patient also has fibromyalgia area she recently saw a rheumatologist who refused to treat her for fibromyalgia. The rheumatologist told her that her primary care doctor would need to be taking care of this. Patient says she has multiple bone pains symmetrically throughout her body. These are chronic.  Objective: BP 118/80 mmHg  Pulse 92  Temp(Src) 97.8 F (36.6 C) (Oral)  Resp 16  Ht 5\' 5"  (1.651 m)  Wt 169 lb 6.4 oz (76.839 kg)  BMI 28.19 kg/m2  SpO2 95% HEENT: Unremarkable Eyes: Fundi are normal Neck: Supple no adenopathy Orthopedic exam: Patient has well-healed surgical scars over her metatarsals on the left foot. There no bony abnormalities on hands or feet or elbows or knees. Skin: No rash  Anxiety state - Plan: LORazepam (ATIVAN) 1 MG tablet  Gastroesophageal reflux disease without esophagitis - Plan: esomeprazole (NEXIUM) 20 MG capsule  Nausea without vomiting - Plan: promethazine (PHENERGAN) 25 MG tablet  Intractable chronic migraine without aura and without status migrainosus - Plan: topiramate (TOPAMAX) 50 MG tablet, SUMAtriptan (IMITREX) 100 MG tablet I'm hoping that the Topamax helps control the headache disorder as well as fibromyalgia. Patient informed to return if she cannot tolerate the Topamax or it is not working for her. Elvina SidleKurt Fatiha Guzy, MD

## 2015-11-22 NOTE — Patient Instructions (Addendum)
I am prescribing Topamax for the headache and fibromyalgia. This is to be taken daily before bedtime. It's not controlling either the headache disorder or the fibromyalgia, would like you to return.  I've given you some Imitrex to be used in the immediate future (today and tomorrow) to help with the immediate headache.    IF you received an x-ray today, you will receive an invoice from [facility]. Please contact [facility] at [phone] with questions or concerns regarding your invoice.   IF you received labwork today, you will receive an invoice from [facility]. Please contact [facility] at [phone] with questions or concerns regarding your invoice.   Our billing staff will not be able to assist you with questions regarding bills from these companies.  You will be contacted with the lab results as soon as they are available. The fastest way to get your results is to activate your My Chart account. Instructions are located on the last page of this paperwork. If you have not heard from us regarding the results in 2 weeks, please contact this office.

## 2016-01-30 ENCOUNTER — Other Ambulatory Visit: Payer: Self-pay | Admitting: Family Medicine

## 2016-01-30 DIAGNOSIS — R110 Nausea: Secondary | ICD-10-CM

## 2016-02-01 NOTE — Telephone Encounter (Signed)
Patient was prescribed in June for migraines by Dr. Milus GlazierLauenstein.  Can we refill

## 2016-02-02 ENCOUNTER — Emergency Department (HOSPITAL_COMMUNITY)
Admission: EM | Admit: 2016-02-02 | Discharge: 2016-02-02 | Discharge status: Absent without leave | Payer: BLUE CROSS/BLUE SHIELD

## 2016-02-02 ENCOUNTER — Ambulatory Visit (INDEPENDENT_AMBULATORY_CARE_PROVIDER_SITE_OTHER): Payer: BLUE CROSS/BLUE SHIELD | Admitting: Physician Assistant

## 2016-02-02 ENCOUNTER — Ambulatory Visit (HOSPITAL_COMMUNITY)
Admission: RE | Admit: 2016-02-02 | Discharge: 2016-02-02 | Disposition: A | Discharge status: Home / Self Care | Payer: BLUE CROSS/BLUE SHIELD | Source: Ambulatory Visit | Attending: Physician Assistant | Admitting: Physician Assistant

## 2016-02-02 ENCOUNTER — Encounter: Payer: Self-pay | Admitting: Physician Assistant

## 2016-02-02 VITALS — BP 120/84 | HR 93 | Temp 98.5°F | Resp 17 | Ht 65.0 in | Wt 164.0 lb

## 2016-02-02 DIAGNOSIS — G8929 Other chronic pain: Secondary | ICD-10-CM

## 2016-02-02 DIAGNOSIS — Z9049 Acquired absence of other specified parts of digestive tract: Secondary | ICD-10-CM | POA: Diagnosis not present

## 2016-02-02 DIAGNOSIS — D72829 Elevated white blood cell count, unspecified: Secondary | ICD-10-CM | POA: Diagnosis present

## 2016-02-02 DIAGNOSIS — K760 Fatty (change of) liver, not elsewhere classified: Secondary | ICD-10-CM | POA: Diagnosis not present

## 2016-02-02 DIAGNOSIS — R1084 Generalized abdominal pain: Secondary | ICD-10-CM

## 2016-02-02 DIAGNOSIS — Z90710 Acquired absence of both cervix and uterus: Secondary | ICD-10-CM | POA: Insufficient documentation

## 2016-02-02 DIAGNOSIS — R1013 Epigastric pain: Secondary | ICD-10-CM | POA: Diagnosis not present

## 2016-02-02 DIAGNOSIS — Z760 Encounter for issue of repeat prescription: Secondary | ICD-10-CM | POA: Diagnosis not present

## 2016-02-02 LAB — POC MICROSCOPIC URINALYSIS (UMFC): MUCUS RE: ABSENT

## 2016-02-02 LAB — POCT URINE PREGNANCY: Preg Test, Ur: NEGATIVE

## 2016-02-02 LAB — POCT CBC
Granulocyte percent: 77.7 %G (ref 37–80)
HCT, POC: 49.8 % — AB (ref 37.7–47.9)
Hemoglobin: 17.3 g/dL — AB (ref 12.2–16.2)
LYMPH, POC: 2.7 (ref 0.6–3.4)
MCH, POC: 30.6 pg (ref 27–31.2)
MCHC: 34.7 g/dL (ref 31.8–35.4)
MCV: 88.2 fL (ref 80–97)
MID (CBC): 0.6 (ref 0–0.9)
MPV: 7.4 fL (ref 0–99.8)
PLATELET COUNT, POC: 443 10*3/uL — AB (ref 142–424)
POC Granulocyte: 11.3 — AB (ref 2–6.9)
POC LYMPH PERCENT: 18.4 %L (ref 10–50)
POC MID %: 3.9 % (ref 0–12)
RBC: 5.65 M/uL — AB (ref 4.04–5.48)
RDW, POC: 15.2 %
WBC: 14.5 10*3/uL — AB (ref 4.6–10.2)

## 2016-02-02 LAB — POCT URINALYSIS DIP (MANUAL ENTRY)
BILIRUBIN UA: NEGATIVE
BILIRUBIN UA: NEGATIVE
Blood, UA: NEGATIVE
Leukocytes, UA: NEGATIVE
Nitrite, UA: NEGATIVE
Protein Ur, POC: NEGATIVE
SPEC GRAV UA: 1.01
Urobilinogen, UA: 0.2
pH, UA: 5.5

## 2016-02-02 LAB — COMPLETE METABOLIC PANEL WITH GFR
ALT: 15 U/L (ref 6–29)
AST: 12 U/L (ref 10–30)
Albumin: 4.5 g/dL (ref 3.6–5.1)
Alkaline Phosphatase: 82 U/L (ref 33–115)
BILIRUBIN TOTAL: 0.6 mg/dL (ref 0.2–1.2)
BUN: 15 mg/dL (ref 7–25)
CO2: 22 mmol/L (ref 20–31)
CREATININE: 0.84 mg/dL (ref 0.50–1.10)
Calcium: 10 mg/dL (ref 8.6–10.2)
Chloride: 101 mmol/L (ref 98–110)
GFR, Est Non African American: 85 mL/min (ref >=60)
GLUCOSE: 238 mg/dL — AB (ref 65–99)
Potassium: 5.3 mmol/L (ref 3.5–5.3)
Sodium: 137 mmol/L (ref 135–146)
Total Protein: 7.7 g/dL (ref 6.1–8.1)

## 2016-02-02 LAB — LIPASE: Lipase: 23 U/L (ref 7–60)

## 2016-02-02 MED ORDER — ALBUTEROL SULFATE HFA 108 (90 BASE) MCG/ACT IN AERS: 2.0000 | INHALATION_SPRAY | RESPIRATORY_TRACT | 3 refills | Status: DC | PRN

## 2016-02-02 MED ORDER — IOPAMIDOL (ISOVUE-300) INJECTION 61%
100.0000 mL | Freq: Once | INTRAVENOUS | Status: AC | PRN
  Administered 2016-02-02: 100 mL via INTRAVENOUS

## 2016-02-02 MED ORDER — IOPAMIDOL (ISOVUE-300) INJECTION 61%
50.0000 mL | Freq: Once | INTRAVENOUS | Status: AC | PRN
  Administered 2016-02-02: 30 mL via ORAL

## 2016-02-02 NOTE — Patient Instructions (Addendum)
You are to [facility] now [facility].  You will be there 2 hours prior to scanning to the drink contrast.  Address: [address], [place], [place] [address].  [phone].     IF you received an x-ray today, you will receive an invoice from [facility]. Please contact [facility] at [phone] with questions or concerns regarding your invoice.   IF you received labwork today, you will receive an invoice from [facility]. Please contact [facility] at [phone] with questions or concerns regarding your invoice.   Our billing staff will not be able to assist you with questions regarding bills from these companies.  You will be contacted with the lab results as soon as they are available. The fastest way to get your results is to activate your My Chart account. Instructions are located on the last page of this paperwork. If you have not heard from us regarding the results in 2 weeks, please contact this office.

## 2016-02-02 NOTE — Progress Notes (Addendum)
02/02/2016 3:44 PM   DOB: [birth year] / MRN: [id number]  SUBJECTIVE:  Kristie Tapia is a 45 y.o. female presenting for epigastric abdominal pain that she describes as a cramp that occurs due to "smells," if she lays on her left side, and occur if she bends over. She associates nausea. This pain has been present and worsening for the last four days.  She denies fever and chills.  She denies a history of diverticulitis.   She reports a history of ulcers that are not bacterial in nature. Reports she has three ulcers and was last scoped on "in the last few years."  She has had her gall bladder out and she has also had her appendix out.  Also reports that she has a hiatal hernia.   She is taking Nexium in the evening.  She takes this before or after eating and sometimes she takes this twice daily.      She uses Albuterol depending on Allergy season.    She is allergic to adhesive [tape]; sulfa antibiotics; decongestant [pseudoephedrine hcl er]; prednisone; percocet [oxycodone-acetaminophen]; zoloft [sertraline hcl]; and latex.   She  has a past medical history of Anemia; Anxiety; Complication of anesthesia (` 1998); Gallstones; GERD (gastroesophageal reflux disease); H/O hiatal hernia; History of stomach ulcers; Migraines; Sleep apnea; and Type II diabetes mellitus (HCC).    She  reports that she has been smoking Cigarettes.  She has a 29.00 pack-year smoking history. She has never used smokeless tobacco. She reports that she drinks alcohol. She reports that she does not use drugs. She  reports that she currently engages in sexual activity. The patient  has a past surgical history that includes Appendectomy (01/03/2012); Abdominal hysterectomy (08/1998); Dilation and curettage of uterus; Abdominal exploration surgery; Tubal ligation (05/1998); LEEP (1998); and laparoscopic appendectomy (01/03/2012).  Her family history is not on file.  Review of Systems  Constitutional: Negative for chills and fever.    Respiratory: Negative for cough.   Cardiovascular: Negative for chest pain.  Gastrointestinal: Positive for abdominal pain, diarrhea, heartburn and nausea. Negative for blood in stool, constipation, melena and vomiting.  Genitourinary: Negative for dysuria.  Skin: Negative for itching and rash.  Neurological: Negative for dizziness and headaches.  Psychiatric/Behavioral: Negative for depression.    The problem list and medications were reviewed and updated by myself where necessary and exist elsewhere in the encounter.   OBJECTIVE:  BP 120/84 (BP Location: Left Arm, Patient Position: Sitting, Cuff Size: Normal)   Pulse 93   Temp 98.5 F (36.9 C) (Oral)   Resp 17   Ht 5\' 5"  (1.651 m)   Wt 164 lb (74.4 kg)   SpO2 96%   BMI 27.29 kg/m   Physical Exam  Constitutional: She is oriented to person, place, and time.  Cardiovascular: Normal rate, regular rhythm and normal heart sounds.   Pulmonary/Chest: Effort normal and breath sounds normal.  Abdominal: Soft. Bowel sounds are normal. She exhibits no distension and no mass. There is tenderness (diffusely). There is guarding. There is no rebound.  Musculoskeletal: Normal range of motion.  Neurological: She is alert and oriented to person, place, and time. No cranial nerve deficit. Coordination normal.    Results for orders placed or performed in visit on 02/02/16 (from the past 72 hour(s))  POCT urinalysis dipstick     Status: Abnormal   Collection Time: 02/02/16  3:37 PM  Result Value Ref Range   Color, UA yellow yellow   Clarity, UA clear clear  Glucose, UA =250 (A) negative   Bilirubin, UA negative negative   Ketones, POC UA negative negative   Spec Grav, UA 1.010    Blood, UA negative negative   pH, UA 5.5    Protein Ur, POC negative negative   Urobilinogen, UA 0.2    Nitrite, UA Negative Negative   Leukocytes, UA Negative Negative  POCT urine pregnancy     Status: None   Collection Time: 02/02/16  3:38 PM  Result  Value Ref Range   Preg Test, Ur Negative Negative  POCT CBC     Status: Abnormal   Collection Time: 02/02/16  3:40 PM  Result Value Ref Range   WBC 14.5 (A) 4.6 - 10.2 K/uL   Lymph, poc 2.7 0.6 - 3.4   POC LYMPH PERCENT 18.4 10 - 50 %L   MID (cbc) 0.6 0 - 0.9   POC MID % 3.9 0 - 12 %M   POC Granulocyte 11.3 (A) 2 - 6.9   Granulocyte percent 77.7 37 - 80 %G   RBC 5.65 (A) 4.04 - 5.48 M/uL   Hemoglobin 17.3 (A) 12.2 - 16.2 g/dL   HCT, POC 78.2 (A) 95.6 - 47.9 %   MCV 88.2 80 - 97 fL   MCH, POC 30.6 27 - 31.2 pg   MCHC 34.7 31.8 - 35.4 g/dL   RDW, POC 21.3 %   Platelet Count, POC 443 (A) 142 - 424 K/uL   MPV 7.4 0 - 99.8 fL  POCT Microscopic Urinalysis ([facility])     Status: Abnormal   Collection Time: 02/02/16  3:42 PM  Result Value Ref Range   WBC,UR,HPF,POC None None WBC/hpf   RBC,UR,HPF,POC None None RBC/hpf   Bacteria None None, Too numerous to count   Mucus Absent Absent   Epithelial Cells, UR Per Microscopy Moderate (A) None, Too numerous to count cells/hpf   CBC Latest Ref Rng & Units 02/02/2016 08/29/2015 05/11/2014  WBC 4.6 - 10.2 K/uL 14.5(A) 10.6(A) 11.7(H)  Hemoglobin 12.2 - 16.2 g/dL 17.3(A) 15.3 15.3(H)  Hematocrit 37.7 - 47.9 % 49.8(A) 43.3 44.5  Platelets 150 - 400 K/uL - - 412(H)   Lab Results  Component Value Date   CREATININE 0.73 08/29/2015    No results found.  ASSESSMENT AND PLAN  Kristie Tapia was seen today for abdominal pain, nausea and medication refill.  Diagnoses and all orders for this visit:  Generalized abdominal pain: She has a concerning exam with a granulocytic shift.  She needs a CT scan today.  I have placed. Given that she has a history of chronic abdominal pain with question of ulcerations I have placed a referral for GI.  -     POCT CBC -     COMPLETE METABOLIC PANEL WITH GFR -     Lipase -     POCT urinalysis dipstick -     POCT urine pregnancy -     POCT Microscopic Urinalysis ([facility])  Medication refill -     albuterol (PROVENTIL  HFA;VENTOLIN HFA) 108 (90 Base) MCG/ACT inhaler; Inhale 2 puffs into the lungs every 4 (four) hours as needed for wheezing or shortness of breath. Reported on 09/21/2015    The patient is advised to call or return to clinic if she does not see an improvement in symptoms, or to seek the care of the closest emergency department if she worsens with the above plan.   Deliah Boston, MHS, PA-C [facility] 02/02/2016 3:44  PM   Ct Abdomen Pelvis W Contrast  Result Date: 02/02/2016 CLINICAL DATA:  Epigastric abdomen pain associated with nausea for the past 4 days. EXAM: CT ABDOMEN AND PELVIS WITH CONTRAST TECHNIQUE: Multidetector CT imaging of the abdomen and pelvis was performed using the standard protocol following bolus administration of intravenous contrast. CONTRAST:  30mL ISOVUE-300 IOPAMIDOL (ISOVUE-300) INJECTION 61%, 100mL ISOVUE-300 IOPAMIDOL (ISOVUE-300) INJECTION 61% COMPARISON:  March 10, 2015 FINDINGS: Lower chest:  No acute findings. Hepatobiliary: There is mild diffuse low density of liver without vessel displacement. The patient is status post prior cholecystectomy. No focal liver lesion is identified. Pancreas: No mass, inflammatory changes, or other significant abnormality. Spleen: There is a 5 mm cyst in the spleen. The spleen is otherwise unremarkable. Adrenals/Urinary Tract: The adrenal glands and kidneys are normal. The bladder is normal. No masses identified. No evidence of hydronephrosis. Stomach/Bowel: No evidence of obstruction, inflammatory process, or abnormal fluid collections. Patient status post prior appendectomy. There is no diverticulitis. Vascular/Lymphatic: No pathologically enlarged lymph nodes. No evidence of abdominal aortic aneurysm. Reproductive: The patient status post prior hysterectomy. There are cysts in bilateral normal size ovaries, likely follicular cysts. Other: None. Musculoskeletal: Degenerative joint changes of the  spine are identified. IMPRESSION: No acute abnormality identified in the abdomen and pelvis. Fatty infiltration of liver. Prior cholecystectomy, appendectomy and hysterectomy. Electronically Signed   By: Sherian ReinWei-Chen  Lin M.D.   On: 02/02/2016 20:33   CT negative for any acute issues.  I have referred to GI. Deliah BostonMichael Zebedee Segundo, MS, PA-C 8:47 AM, 02/03/2016

## 2016-02-03 MED ORDER — TRAMADOL HCL 50 MG PO TABS: 25.0000 mg | ORAL_TABLET | Freq: Three times a day (TID) | ORAL | 0 refills | Status: DC | PRN

## 2016-02-03 NOTE — Addendum Note (Signed)
Addended by: Ofilia NeasLARK, MICHAEL L on: 02/03/2016 08:47 AM   Modules accepted: Orders

## 2016-02-10 ENCOUNTER — Encounter: Payer: Self-pay | Admitting: Physician Assistant

## 2016-02-10 ENCOUNTER — Ambulatory Visit (INDEPENDENT_AMBULATORY_CARE_PROVIDER_SITE_OTHER): Payer: BLUE CROSS/BLUE SHIELD | Admitting: Physician Assistant

## 2016-02-10 VITALS — BP 110/66 | HR 84 | Temp 98.0°F | Resp 18 | Ht 65.0 in | Wt 166.0 lb

## 2016-02-10 DIAGNOSIS — Z794 Long term (current) use of insulin: Secondary | ICD-10-CM | POA: Diagnosis not present

## 2016-02-10 DIAGNOSIS — G8929 Other chronic pain: Secondary | ICD-10-CM

## 2016-02-10 DIAGNOSIS — R1013 Epigastric pain: Secondary | ICD-10-CM | POA: Diagnosis not present

## 2016-02-10 DIAGNOSIS — E1165 Type 2 diabetes mellitus with hyperglycemia: Secondary | ICD-10-CM

## 2016-02-10 DIAGNOSIS — R110 Nausea: Secondary | ICD-10-CM | POA: Diagnosis not present

## 2016-02-10 LAB — POCT GLYCOSYLATED HEMOGLOBIN (HGB A1C): HEMOGLOBIN A1C: 9.1

## 2016-02-10 MED ORDER — ESOMEPRAZOLE MAGNESIUM 40 MG PO CPDR: 40.0000 mg | DELAYED_RELEASE_CAPSULE | Freq: Every day | ORAL | 3 refills | Status: DC

## 2016-02-10 MED ORDER — DICYCLOMINE HCL 10 MG PO CAPS: 10.0000 mg | ORAL_CAPSULE | Freq: Three times a day (TID) | ORAL | 2 refills | Status: DC

## 2016-02-10 MED ORDER — PROMETHAZINE HCL 25 MG PO TABS: ORAL_TABLET | ORAL | 0 refills | Status: DC

## 2016-02-10 MED ORDER — INSULIN GLARGINE 100 UNIT/ML SOLOSTAR PEN: 12.0000 [IU] | PEN_INJECTOR | Freq: Every day | SUBCUTANEOUS | 99 refills | Status: DC

## 2016-02-10 NOTE — Progress Notes (Signed)
02/10/2016 5:50 PM   DOB: 10-26-1970 / MRN: [id number]  SUBJECTIVE:  Kristie Tapia is a 45 y.o. female presenting for a recheck of abdominal pain.  She reports that she is not any better.  I saw her about 9 days ago and her lab work and symptoms lead to a CT scan which was negative.  I prescribed her some tramadol and referred her to GI at that time. The tramadol did not help.  She has an appointment with [facility] on the 14th of this month.   Reports today that her abdominal pain is not all that much better.  Continues to have sporadic epigastric pain that tends to last about 10-15 minutes and then goes away.  It occurs without association to eating.  She has a history of ulcer and bacteria was ruled out per her report.  She takes Nexium 20 mg thirty minutes before dinner.  Denies vaginal discharge.  She is s/p hysterectomy and cholecystectomy.  She has a long history of poorly controlled diabetes. She is currently off of her Victoza and is only taking Novolog.  She is a current everyday smoker with no plans to quit.   She is under the impression that medication only works in her body for a week or two and then stops working.  She starts and stops her various diabetes medications to avoid this problem.  She got this idea from an ENT due to intractable sinus infections, and I think she means that her body becomes immune to medications.  I have tried to dissuade her from this notion.   She is allergic to adhesive [tape]; sulfa antibiotics; decongestant [pseudoephedrine hcl er]; prednisone; percocet [oxycodone-acetaminophen]; zoloft [sertraline hcl]; and latex.   She  has a past medical history of Anemia; Anxiety; Complication of anesthesia (` 1998); Gallstones; GERD (gastroesophageal reflux disease); H/O hiatal hernia; History of stomach ulcers; Migraines; Sleep apnea; and Type II diabetes mellitus (HCC).    She  reports that she has been smoking Cigarettes.  She has a 29.00 pack-year  smoking history. She has never used smokeless tobacco. She reports that she drinks alcohol. She reports that she does not use drugs. She  reports that she currently engages in sexual activity. The patient  has a past surgical history that includes Appendectomy (01/03/2012); Abdominal hysterectomy (08/1998); Dilation and curettage of uterus; Abdominal exploration surgery; Tubal ligation (05/1998); LEEP (1998); and laparoscopic appendectomy (01/03/2012).  Her family history is not on file.  Review of Systems  Constitutional: Negative for fever.  Respiratory: Negative for cough.   Cardiovascular: Negative for chest pain.  Gastrointestinal: Positive for abdominal pain and nausea. Negative for blood in stool, constipation, diarrhea, melena and vomiting.  Musculoskeletal: Negative for myalgias.  Skin: Negative for rash.  Neurological: Negative for dizziness and headaches.    The problem list and medications were reviewed and updated by myself where necessary and exist elsewhere in the encounter.   OBJECTIVE:  BP 110/66 (BP Location: Right Arm, Patient Position: Sitting, Cuff Size: Small)   Pulse 84   Temp 98 F (36.7 C) (Oral)   Resp 18   Ht 5\' 5"  (1.651 m)   Wt 166 lb (75.3 kg)   SpO2 96%   BMI 27.62 kg/m    Physical Exam  Constitutional: She is oriented to person, place, and time. She appears well-developed and well-nourished. No distress.  Cardiovascular: Normal rate, regular rhythm and normal heart sounds.   Pulmonary/Chest: Effort normal and breath sounds normal.  Abdominal:  Soft. Bowel sounds are normal. She exhibits no mass.  Musculoskeletal: Normal range of motion.  Neurological: She is alert and oriented to person, place, and time.  Skin: Skin is warm and dry. She is not diaphoretic.     Lab Results  Component Value Date   HGBA1C 9.1 02/10/2016    Results for orders placed or performed in visit on 02/10/16 (from the past 72 hour(s))  POCT glycosylated hemoglobin (Hb A1C)      Status: None   Collection Time: 02/10/16  3:37 PM  Result Value Ref Range   Hemoglobin A1C 9.1     No results found.  ASSESSMENT AND PLAN  Kristie Tapia was seen today for follow-up.  Diagnoses and all orders for this visit:  Type 2 diabetes mellitus with hyperglycemia, with long-term current use of insulin [facility](HCC): Her pain may be related to poor glycemic control.  I am having her stop all of her other diabetic medications except Novolog sliding scale, and am starting on a low dose of lantus.  Will see her back in three months for A1c recheck and medication titration.  -     POCT glycosylated hemoglobin (Hb A1C) -     Insulin Glargine (LANTUS SOLOSTAR) 100 UNIT/ML Solostar Pen; Inject 12 Units into the skin daily at 10 pm.  Abdominal pain, chronic, epigastric -     dicyclomine (BENTYL) 10 MG capsule; Take 1 capsule (10 mg total) by mouth 4 (four) times daily -  before meals and at bedtime. -     esomeprazole (NEXIUM) 40 MG capsule; Take 1 capsule (40 mg total) by mouth daily.  Nausea without vomiting -     promethazine (PHENERGAN) 25 MG tablet; TAKE ONE TABLET BY MOUTH EVERY 8 HOURS AS NEEDED FOR NAUSEA AND VOMITING    The patient is advised to call or return to clinic if she does not see an improvement in symptoms, or to seek the care of the closest emergency department if she worsens with the above plan.   Deliah BostonMichael Sharman Garrott, MHS, PA-C [facility] 02/10/2016 5:50 PM

## 2016-02-10 NOTE — Patient Instructions (Signed)
     IF you received an x-ray today, you will receive an invoice from [facility]. Please contact [facility] at [phone] with questions or concerns regarding your invoice.   IF you received labwork today, you will receive an invoice from [facility]. Please contact [facility] at [phone] with questions or concerns regarding your invoice.   Our billing staff will not be able to assist you with questions regarding bills from these companies.  You will be contacted with the lab results as soon as they are available. The fastest way to get your results is to activate your My Chart account. Instructions are located on the last page of this paperwork. If you have not heard from us regarding the results in 2 weeks, please contact this office.      

## 2016-02-11 ENCOUNTER — Telehealth: Payer: Self-pay

## 2016-02-11 NOTE — Telephone Encounter (Signed)
Patient is calling because the insurance won't cover One Touch meters and strips. Patient would like a prescription for a new meter and test strips that the insurance will cover. Patient states she has [employer]. [phone] [facility] on [address].

## 2016-02-14 MED ORDER — BLOOD GLUCOSE METER KIT
PACK | 0 refills | Status: DC
Start: 2016-02-14 — End: 2017-11-04

## 2016-02-14 NOTE — Telephone Encounter (Signed)
Done

## 2016-02-17 ENCOUNTER — Other Ambulatory Visit: Payer: Self-pay | Admitting: Family Medicine

## 2016-02-17 DIAGNOSIS — G43719 Chronic migraine without aura, intractable, without status migrainosus: Secondary | ICD-10-CM

## 2016-03-06 ENCOUNTER — Other Ambulatory Visit: Payer: Self-pay | Admitting: Physician Assistant

## 2016-03-06 DIAGNOSIS — R110 Nausea: Secondary | ICD-10-CM

## 2016-03-07 NOTE — Telephone Encounter (Signed)
02/10/2016 last ov and refill of med #30 with 0 refills and 01/2016 last labs.

## 2016-03-27 ENCOUNTER — Other Ambulatory Visit: Payer: Self-pay | Admitting: Family Medicine

## 2016-03-27 ENCOUNTER — Other Ambulatory Visit: Payer: Self-pay | Admitting: Physician Assistant

## 2016-03-27 DIAGNOSIS — R110 Nausea: Secondary | ICD-10-CM

## 2016-03-27 DIAGNOSIS — G43719 Chronic migraine without aura, intractable, without status migrainosus: Secondary | ICD-10-CM

## 2016-03-28 NOTE — Telephone Encounter (Signed)
Patient said she is going to try to come in Monday or Saturday those are the only days she can get a ride.   She is still having cramping.

## 2016-03-28 NOTE — Telephone Encounter (Signed)
Please call in both. Deliah BostonMichael Denni France, MS, PA-C 1:49 PM, 03/28/2016

## 2016-04-07 ENCOUNTER — Ambulatory Visit (INDEPENDENT_AMBULATORY_CARE_PROVIDER_SITE_OTHER): Payer: BLUE CROSS/BLUE SHIELD | Admitting: Family Medicine

## 2016-04-07 VITALS — BP 110/70 | HR 88 | Temp 97.8°F | Resp 16 | Ht 65.0 in | Wt 166.0 lb

## 2016-04-07 DIAGNOSIS — M436 Torticollis: Secondary | ICD-10-CM | POA: Diagnosis not present

## 2016-04-07 DIAGNOSIS — F411 Generalized anxiety disorder: Secondary | ICD-10-CM

## 2016-04-07 DIAGNOSIS — M797 Fibromyalgia: Secondary | ICD-10-CM

## 2016-04-07 MED ORDER — LORAZEPAM 0.5 MG PO TABS: 0.5000 mg | ORAL_TABLET | Freq: Three times a day (TID) | ORAL | 0 refills | Status: DC | PRN

## 2016-04-07 MED ORDER — CYCLOBENZAPRINE HCL 10 MG PO TABS: 10.0000 mg | ORAL_TABLET | Freq: Three times a day (TID) | ORAL | 0 refills | Status: DC | PRN

## 2016-04-07 NOTE — Progress Notes (Signed)
Subjective:  By signing my name below, I, Kristie Tapia, attest that this documentation has been prepared under the direction and in the presence of Delman Cheadle, MD.  Electronically Signed: Thea Alken, ED Scribe. 04/07/2016. 2:21 PM.   Patient ID: Kristie Tapia, female    DOB: 05-05-1971, 45 y.o.   MRN: [id number]  HPI Chief Complaint  Patient presents with  . Medication Refill    Ativan, and pt would like to discuss RX for fibromyalgia   . Back Pain    Left side pain from shoulder to flank - pt state she lefted a heavy bag     HPI Comments: Kristie Tapia is a 45 y.o. female who [facility] complaining of neck pain. Pt has hx of fibromyalgia. She has been prescribed Topamax to treat both fibromyalgia and migraines but states medication does not work well for her fibromyalgia. she reports bilateral neck pain, left worse than right. States pain starts behind left ear and radiates down to left shoulder. She finds relief with applying pressure to left neck. She's had cervical imaging in the past and recalls being told she's had straightening in c-spine.  She denies pain and tingling in arms and fingers.     Pt would also like a refill of Lorazepam. She uses medication for anxiety and panic attacks. Pt finds panic attacks are triggers by being large crowds, and will take medication have the medication with her.   Depression screen [facility] 2/9 02/10/2016 02/02/2016 11/22/2015 09/21/2015 08/29/2015  Decreased Interest 0 0 0 0 0  Down, Depressed, Hopeless 0 0 0 0 0  PHQ - 2 Score 0 0 0 0 0     There are no active problems to display for this patient.  Past Medical History:  Diagnosis Date  . Anemia   . Anxiety   . Complication of anesthesia ` 1998   "lungs collapsed during LEEP procedure"  . Gallstones   . GERD (gastroesophageal reflux disease)   . H/O hiatal hernia   . History of stomach ulcers   . Migraines   . Sleep apnea   . Type II diabetes mellitus  ([place])    Past Surgical History:  Procedure Laterality Date  . ABDOMINAL EXPLORATION SURGERY     "I've had several"  . ABDOMINAL HYSTERECTOMY  08/1998  . APPENDECTOMY  01/03/2012  . DILATION AND CURETTAGE OF UTERUS    . LAPAROSCOPIC APPENDECTOMY  01/03/2012   Procedure: APPENDECTOMY LAPAROSCOPIC;  Surgeon: Edward Jolly, MD;  Location: [place];  Service: General;  Laterality: N/A;  . LEEP  1998   "lungs collapsed"  . TUBAL LIGATION  05/1998   Allergies  Allergen Reactions  . Adhesive [Tape] Other (See Comments)    Blisters USE PAPER TAPE ONLY  . Sulfa Antibiotics Other (See Comments)    Burns the inside of my mouth; "blisters; leaves tongue and inside of mouth solid red"  . Decongestant [Pseudoephedrine Hcl Er] Itching    ALL DECONGESTANTS  . Prednisone Other (See Comments)    Gives me a bad attitude  . Percocet [Oxycodone-Acetaminophen] Other (See Comments)    PT reports having nightmares when taking Percocet  . Zoloft [Sertraline Hcl]   . Latex Itching   Prior to Admission medications   Medication Sig Start Date End Date Taking? Authorizing Provider  albuterol (PROVENTIL HFA;VENTOLIN HFA) 108 (90 Base) MCG/ACT inhaler Inhale 2 puffs into the lungs every 4 (four) hours as needed for wheezing or shortness  of breath. Reported on 09/21/2015 02/02/16  Yes Tereasa Coop, PA-C  blood glucose meter kit and supplies Dispense based on patient and insurance preference. Four times daily. Dx: E11.9 02/14/16  Yes Tereasa Coop, PA-C  escitalopram (LEXAPRO) 20 MG tablet Take 1 tablet (20 mg total) by mouth daily. 08/29/15  Yes Wardell Honour, MD  esomeprazole (NEXIUM) 40 MG capsule Take 1 capsule (40 mg total) by mouth daily. 02/10/16  Yes Tereasa Coop, PA-C  fluticasone (FLONASE) 50 MCG/ACT nasal spray Place 2 sprays into both nostrils daily. 08/29/15  Yes Wardell Honour, MD  Insulin Glargine (LANTUS SOLOSTAR) 100 UNIT/ML Solostar Pen Inject 12 Units into the skin daily at 10 pm. 02/10/16  Yes  Tereasa Coop, PA-C  LORazepam (ATIVAN) 1 MG tablet Take 0.5-1 tablets (0.5-1 mg total) by mouth every 8 (eight) hours as needed. anxiety 11/22/15  Yes Robyn Haber, MD  promethazine (PHENERGAN) 25 MG tablet TAKE ONE TABLET BY MOUTH EVERY 8 HOURS AS NEEDED FOR NAUSEA AND VOMITING 03/30/16  Yes Tereasa Coop, PA-C  SUMAtriptan (IMITREX) 100 MG tablet Take 1 tablet (100 mg total) by mouth every 2 (two) hours as needed for migraine. May repeat in 2 hours if headache persists or recurs. 11/22/15  Yes Robyn Haber, MD  topiramate (TOPAMAX) 50 MG tablet TAKE ONE TABLET BY MOUTH AT BEDTIME 03/28/16  Yes Robyn Haber, MD  traZODone (DESYREL) 100 MG tablet Take 1 tablet (100 mg total) by mouth at bedtime. Reported on 09/21/2015 09/21/15  Yes Darlyne Russian, MD  dicyclomine (BENTYL) 10 MG capsule Take 1 capsule (10 mg total) by mouth 4 (four) times daily -  before meals and at bedtime. 02/10/16   Tereasa Coop, PA-C  methocarbamol (ROBAXIN) 500 MG tablet Take 500 mg by mouth 4 (four) times daily. Reported on 11/22/2015    Historical Provider, MD  traMADol (ULTRAM) 50 MG tablet TAKE ONE-HALF TABLET BY MOUTH EVERY 8 HOURS AS NEEDED FOR  MODERATE  PAIN Patient not taking: Reported on 04/07/2016 03/30/16   Tereasa Coop, PA-C   Social History   Social History  . Marital status: Single    Spouse name: N/A  . Number of children: N/A  . Years of education: N/A   Occupational History  . Not on file.   Social History Main Topics  . Smoking status: Current Every Day Smoker    Packs/day: 1.00    Years: 29.00    Types: Cigarettes  . Smokeless tobacco: Never Used  . Alcohol use Yes     Comment: 01/03/2012 "6pk would last me a year"  . Drug use: No  . Sexual activity: Yes   Other Topics Concern  . Not on file   Social History Narrative  . No narrative on file      Review of Systems  Constitutional: Positive for activity change and fatigue. Negative for appetite change and fever.    Musculoskeletal: Positive for arthralgias, back pain, gait problem, myalgias, neck pain and neck stiffness. Negative for joint swelling.  Skin: Negative for rash and wound.  Neurological: Negative for weakness and numbness.  Psychiatric/Behavioral: Positive for sleep disturbance. Negative for dysphoric mood. The patient is nervous/anxious.        Objective:   Physical Exam  Constitutional: She is oriented to person, place, and time. She appears well-developed and well-nourished. No distress.  Eyes: Conjunctivae are normal.  Neck: No thyromegaly present.  Cardiovascular: Normal rate.   Pulmonary/Chest: Effort normal.  Musculoskeletal:  No cervical spinous process instability. No tender occipital groove. Tight paraspinal muscle left worse than right.   Lymphadenopathy:    She has no cervical adenopathy.  Neurological: She is alert and oriented to person, place, and time.  Skin: Skin is warm and dry.  Psychiatric: She has a normal mood and affect. Her behavior is normal.  Nursing note and vitals reviewed.   Vitals:   04/07/16 1352  BP: 110/70  Pulse: 88  Resp: 16  Temp: 97.8 F (36.6 C)  TempSrc: Oral  SpO2: 98%  Weight: 166 lb (75.3 kg)  Height: 5' 5"  (1.651 m)     Assessment & Plan:   1. Torticollis, acute   2. Anxiety state   3. Fibromyalgia    Pt really miserable with chronic pain  - can't stand it any longer. Suggested trying soft-cspine collar and sleep with horseshoe pillow around neck. No response to methocarbamol so will try flexeril for neck and fibro. Try heat, gentle stretching, needs to increase exercise.   Meds ordered this encounter  Medications  . cyclobenzaprine (FLEXERIL) 10 MG tablet    Sig: Take 1 tablet (10 mg total) by mouth 3 (three) times daily as needed for muscle spasms.    Dispense:  60 tablet    Refill:  0  . LORazepam (ATIVAN) 0.5 MG tablet    Sig: Take 1 tablet (0.5 mg total) by mouth every 8 (eight) hours as needed. anxiety     Dispense:  30 tablet    Refill:  0    I personally performed the services described in this documentation, which was scribed in my presence. The recorded information has been reviewed and considered, and addended by me as needed.   Delman Cheadle, M.D.  [facility] [address] [place], [place] [address] [phone] phone [phone] fax  04/09/16 11:26 PM

## 2016-04-07 NOTE — Patient Instructions (Addendum)
     IF you received an x-ray today, you will receive an invoice from [facility]. Please contact [facility] at [phone] with questions or concerns regarding your invoice.   IF you received labwork today, you will receive an invoice from [facility]. Please contact [facility] at [phone] with questions or concerns regarding your invoice.   Our billing staff will not be able to assist you with questions regarding bills from these companies.  You will be contacted with the lab results as soon as they are available. The fastest way to get your results is to activate your My Chart account. Instructions are located on the last page of this paperwork. If you have not heard from us regarding the results in 2 weeks, please contact this office.    Acute Torticollis Torticollis is a condition in which the muscles of the neck tighten (contract) abnormally, causing the neck to twist and the head to move into an unnatural position. Torticollis that develops suddenly is called acute torticollis. If torticollis becomes chronic and is left untreated, the face and neck can become deformed. CAUSES This condition may be caused by:  Sleeping in an awkward position (common).  Extending or twisting the neck muscles beyond their normal position.  Infection. In some cases, the cause may not be known. SYMPTOMS Symptoms of this condition include:  An unnatural position of the head.  Neck pain.  A limited ability to move the neck.  Twisting of the neck to one side. DIAGNOSIS This condition is diagnosed with a physical exam. You may also have imaging tests, such as an X-ray, CT scan, or MRI. TREATMENT Treatment for this condition involves trying to relax the neck muscles. It may include:  Medicines or shots.  Physical therapy.  Surgery. This may be done in severe cases. HOME CARE INSTRUCTIONS  Take medicines only as directed by your health care  provider.  Do stretching exercises and massage your neck as directed by your health care provider.  Keep all follow-up visits as directed by your health care provider. This is important. SEEK MEDICAL CARE IF:  You develop a fever. SEEK IMMEDIATE MEDICAL CARE IF:  You develop difficulty breathing.  You develop noisy breathing (stridor).  You start drooling.  You have trouble swallowing or have pain with swallowing.  You develop numbness or weakness in your hands or feet.  You have changes in your speech, understanding, or vision.  Your pain gets worse.   This information is not intended to replace advice given to you by your health care provider. Make sure you discuss any questions you have with your health care provider.   Document Released: 05/18/2000 Document Revised: 10/05/2014 Document Reviewed: 05/17/2014 Elsevier Interactive Patient Education [employer]2016 Elsevier Inc.

## 2016-04-25 ENCOUNTER — Other Ambulatory Visit: Payer: Self-pay | Admitting: Family Medicine

## 2016-04-28 NOTE — Telephone Encounter (Signed)
04/2016 last ov 

## 2016-05-12 ENCOUNTER — Ambulatory Visit (INDEPENDENT_AMBULATORY_CARE_PROVIDER_SITE_OTHER): Payer: BLUE CROSS/BLUE SHIELD | Admitting: Family Medicine

## 2016-05-12 VITALS — BP 122/78 | HR 96 | Temp 98.4°F | Resp 16 | Ht 65.0 in | Wt 171.0 lb

## 2016-05-12 DIAGNOSIS — K122 Cellulitis and abscess of mouth: Secondary | ICD-10-CM

## 2016-05-12 MED ORDER — AMOXICILLIN 500 MG PO CAPS: ORAL_CAPSULE | ORAL | 0 refills | Status: DC

## 2016-05-12 NOTE — Progress Notes (Signed)
Patient ID: Kristie Tapia, female    DOB: 28-Feb-1971, 45 y.o.   MRN: [id number]  PCP: Kathlen Brunswick, PA-C  Chief Complaint  Patient presents with  . Jaw Pain    Right side jaw pain/gum swelling - states a tooth is broken on that side     Subjective:   HPI 45 year old female presents for evaluation of right sided gum pain x 2 days. Pt presents newly to me today. Chronic problems include, current smoker, anxiety, Type 2 Diabetes -insulin dependent diabetes, and migraines. Pt reports that she is aware that she needs to see a dentist has no dental insurance.Reports pressure in back of mouth and jaw is painful with talking and chewing. For pain relief she has been taking 1000 mg of ibuprofen. Significant pain with brushing teeth.Last saw a dentist 1 year ago and was told that all of her teeth would need to be extracted.   Social History   Social History  . Marital status: Single    Spouse name: N/A  . Number of children: N/A  . Years of education: N/A   Occupational History  . Not on file.   Social History Main Topics  . Smoking status: Current Every Day Smoker    Packs/day: 1.00    Years: 29.00    Types: Cigarettes  . Smokeless tobacco: Never Used  . Alcohol use Yes     Comment: 01/03/2012 "6pk would last me a year"  . Drug use: No  . Sexual activity: Yes   Other Topics Concern  . Not on file   Social History Narrative  . No narrative on file   Family History  Problem Relation Age of Onset  . Breast cancer      Review of Systems See HPI  There are no active problems to display for this patient.    Prior to Admission medications   Medication Sig Start Date End Date Taking? Authorizing Provider  albuterol (PROVENTIL HFA;VENTOLIN HFA) 108 (90 Base) MCG/ACT inhaler Inhale 2 puffs into the lungs every 4 (four) hours as needed for wheezing or shortness of breath. Reported on 09/21/2015 02/02/16  Yes Tereasa Coop, PA-C  blood glucose meter kit and supplies  Dispense based on patient and insurance preference. Four times daily. Dx: E11.9 02/14/16  Yes Tereasa Coop, PA-C  dicyclomine (BENTYL) 10 MG capsule Take 1 capsule (10 mg total) by mouth 4 (four) times daily -  before meals and at bedtime. 02/10/16  Yes Tereasa Coop, PA-C  escitalopram (LEXAPRO) 20 MG tablet TAKE ONE TABLET BY MOUTH ONCE DAILY 04/28/16  Yes Shawnee Knapp, MD  esomeprazole (NEXIUM) 40 MG capsule Take 1 capsule (40 mg total) by mouth daily. 02/10/16  Yes Tereasa Coop, PA-C  fluticasone (FLONASE) 50 MCG/ACT nasal spray Place 2 sprays into both nostrils daily. 08/29/15  Yes Wardell Honour, MD  Insulin Glargine (LANTUS SOLOSTAR) 100 UNIT/ML Solostar Pen Inject 12 Units into the skin daily at 10 pm. 02/10/16  Yes Tereasa Coop, PA-C  LORazepam (ATIVAN) 0.5 MG tablet Take 1 tablet (0.5 mg total) by mouth every 8 (eight) hours as needed. anxiety 04/07/16  Yes Shawnee Knapp, MD  promethazine (PHENERGAN) 25 MG tablet TAKE ONE TABLET BY MOUTH EVERY 8 HOURS AS NEEDED FOR NAUSEA AND VOMITING 03/30/16  Yes Tereasa Coop, PA-C  SUMAtriptan (IMITREX) 100 MG tablet Take 1 tablet (100 mg total) by mouth every 2 (two) hours as needed for migraine. May repeat in  2 hours if headache persists or recurs. 11/22/15  Yes Robyn Haber, MD  traZODone (DESYREL) 100 MG tablet Take 1 tablet (100 mg total) by mouth at bedtime. Reported on 09/21/2015 09/21/15  Yes Darlyne Russian, MD  cyclobenzaprine (FLEXERIL) 10 MG tablet Take 1 tablet (10 mg total) by mouth 3 (three) times daily as needed for muscle spasms. Patient not taking: Reported on 05/12/2016 04/07/16   Shawnee Knapp, MD  topiramate (TOPAMAX) 50 MG tablet TAKE ONE TABLET BY MOUTH AT BEDTIME Patient not taking: Reported on 05/12/2016 03/28/16   Robyn Haber, MD     Allergies  Allergen Reactions  . Adhesive [Tape] Other (See Comments)    Blisters USE PAPER TAPE ONLY  . Sulfa Antibiotics Other (See Comments)    Burns the inside of my mouth; "blisters; leaves  tongue and inside of mouth solid red"  . Decongestant [Pseudoephedrine Hcl Er] Itching    ALL DECONGESTANTS  . Prednisone Other (See Comments)    Gives me a bad attitude  . Percocet [Oxycodone-Acetaminophen] Other (See Comments)    PT reports having nightmares when taking Percocet  . Zoloft [Sertraline Hcl]   . Latex Itching   Objective:  Physical Exam  Constitutional: She is oriented to person, place, and time. She appears well-developed and well-nourished.  HENT:  Head: Normocephalic and atraumatic.  Mouth/Throat: Abnormal dentition. Dental abscesses and dental caries present.    Neck: Normal range of motion. Neck supple.  Cardiovascular: Normal rate, regular rhythm, normal heart sounds and intact distal pulses.   Pulmonary/Chest: Effort normal and breath sounds normal.  Neurological: She is alert and oriented to person, place, and time.  Skin: Skin is warm and dry.  Psychiatric: She has a normal mood and affect. Her behavior is normal. Judgment and thought content normal.    Today's Vitals   05/12/16 1321  BP: 122/78  Pulse: 96  Resp: 16  Temp: 98.4 F (36.9 C)  TempSrc: Oral  SpO2: 98%  Weight: 171 lb (77.6 kg)  Height: 5' 5"  (1.651 m)     Assessment & Plan:  1. Oral infection Plan: -Amoxicillin 500 mg twice daily x 7 days  -Ibuprofen 800 mg every 12 hours as needed for tooth pain. - Ambulatory referral to Dentistry  Carroll Sage. Kenton Kingfisher, MSN, FNP-C [facility]

## 2016-05-12 NOTE — Patient Instructions (Addendum)
Amoxicillin  Take 2 tablets today, the one tablet every 12 hours x 7 days.  Continue 800 mg of Ibuprofen every 12 hours as needed for pain.  I've placed a referral for you for [facility].  IF you received an x-ray today, you will receive an invoice from [facility]. Please contact [facility] at [phone] with questions or concerns regarding your invoice.   IF you received labwork today, you will receive an invoice from [facility]. Please contact [facility] at [phone] with questions or concerns regarding your invoice.   Our billing staff will not be able to assist you with questions regarding bills from these companies.  You will be contacted with the lab results as soon as they are available. The fastest way to get your results is to activate your My Chart account. Instructions are located on the last page of this paperwork. If you have not heard from us regarding the results in 2 weeks, please contact this office.     Dental Abscess A dental abscess is a collection of pus in or around a tooth. What are the causes? This condition is caused by a bacterial infection around the root of the tooth that involves the inner part of the tooth (pulp). It may result from:  Severe tooth decay.  Trauma to the tooth that allows bacteria to enter into the pulp, such as a broken or chipped tooth.  Severe gum disease around a tooth. What are the signs or symptoms? Symptoms of this condition include:  Severe pain in and around the infected tooth.  Swelling and redness around the infected tooth, in the mouth, or in the face.  Tenderness.  Pus drainage.  Bad breath.  Bitter taste in the mouth.  Difficulty swallowing.  Difficulty opening the mouth.  Nausea.  Vomiting.  Chills.  Swollen neck glands.  Fever. How is this diagnosed? This condition is diagnosed with examination of the infected tooth. During the exam,  your dentist may tap on the infected tooth. Your dentist will also ask about your medical and dental history and may order X-rays. How is this treated? This condition is treated by eliminating the infection. This may be done with:  Antibiotic medicine.  A root canal. This may be performed to save the tooth.  Pulling (extracting) the tooth. This may also involve draining the abscess. This is done if the tooth cannot be saved. Follow these instructions at home:  Take medicines only as directed by your dentist.  If you were prescribed antibiotic medicine, finish all of it even if you start to feel better.  Rinse your mouth (gargle) often with salt water to relieve pain or swelling.  Do not drive or operate heavy machinery while taking pain medicine.  Do not apply heat to the outside of your mouth.  Keep all follow-up visits as directed by your dentist. This is important. Contact a health care provider if:  Your pain is worse and is not helped by medicine. Get help right away if:  You have a fever or chills.  Your symptoms suddenly get worse.  You have a very bad headache.  You have problems breathing or swallowing.  You have trouble opening your mouth.  You have swelling in your neck or around your eye. This information is not intended to replace advice given to you by your health care provider. Make sure you discuss any questions you have with your health care provider. Document Released: 05/21/2005 Document Revised:  09/29/2015 Document Reviewed: 05/18/2014 Elsevier Interactive Patient Education  2017 [employer]

## 2016-05-24 ENCOUNTER — Telehealth: Payer: Self-pay

## 2016-05-24 NOTE — Telephone Encounter (Signed)
04/2016 last ov omga 3  1 gm 2 caps bid requested, on old med list.

## 2016-05-24 NOTE — Telephone Encounter (Signed)
Deliah BostonMichael Clark is this patient's PCP.  Omega 3 can be purchased without a prescription OTC. Is there a reason she needs it prescribed?  If so, OK to do, and help her arrange follow-up of diabetes with Mr. Chestine SporeClark, [place]

## 2016-05-25 NOTE — Telephone Encounter (Signed)
Needs f/u OV with fasting labs for this - does not have lipid panel in epic. Is due for DM f/u OV as uncontrolled a1c 9.1 > 3 mos prior

## 2016-05-29 NOTE — Telephone Encounter (Signed)
Pt agreed to come in to see Casimiro NeedleMichael. Will plan to CB and sch appt for Sat 1/6 since she only has a car on Saturdays. She did ask if Casimiro NeedleMichael could put in order for fasting labs (lipids and any other labs needed, A1C)? She can come in one morning briefly and have the labs drawn so that they are resulted by time of appt.

## 2016-05-30 NOTE — Telephone Encounter (Signed)
I can't do this in the walk in clinic.  I will order them as soon as I see her check in so  can speed her visit along. Deliah BostonMichael Armonie Mettler, MS, PA-C 6:58 PM, 05/30/2016

## 2016-05-31 NOTE — Telephone Encounter (Signed)
Advised pt and she agreed and will let them know when she checks in that Casimiro NeedleMichael was going to get her labs ordered when she got there for the appt, so they can alert Casimiro NeedleMichael that she has arrived.

## 2016-06-09 ENCOUNTER — Ambulatory Visit (INDEPENDENT_AMBULATORY_CARE_PROVIDER_SITE_OTHER): Payer: BLUE CROSS/BLUE SHIELD | Admitting: Physician Assistant

## 2016-06-09 VITALS — BP 116/80 | HR 99 | Temp 97.4°F | Resp 16 | Ht 65.0 in | Wt 174.0 lb

## 2016-06-09 DIAGNOSIS — E1165 Type 2 diabetes mellitus with hyperglycemia: Secondary | ICD-10-CM | POA: Diagnosis not present

## 2016-06-09 DIAGNOSIS — Z794 Long term (current) use of insulin: Secondary | ICD-10-CM | POA: Diagnosis not present

## 2016-06-09 DIAGNOSIS — K130 Diseases of lips: Secondary | ICD-10-CM | POA: Diagnosis not present

## 2016-06-09 DIAGNOSIS — K068 Other specified disorders of gingiva and edentulous alveolar ridge: Secondary | ICD-10-CM

## 2016-06-09 LAB — POCT GLYCOSYLATED HEMOGLOBIN (HGB A1C): HEMOGLOBIN A1C: 7.9

## 2016-06-09 MED ORDER — AMOXICILLIN-POT CLAVULANATE 875-125 MG PO TABS: 1.0000 | ORAL_TABLET | Freq: Two times a day (BID) | ORAL | 0 refills | Status: DC

## 2016-06-09 MED ORDER — DULAGLUTIDE 0.75 MG/0.5ML ~~LOC~~ SOAJ: SUBCUTANEOUS | 1 refills | Status: DC

## 2016-06-09 MED ORDER — FLUCONAZOLE 150 MG PO TABS: 150.0000 mg | ORAL_TABLET | Freq: Once | ORAL | 0 refills | Status: AC

## 2016-06-09 MED ORDER — FLUCONAZOLE 150 MG PO TABS: 150.0000 mg | ORAL_TABLET | Freq: Once | ORAL | 0 refills | Status: DC

## 2016-06-09 NOTE — Patient Instructions (Addendum)
  Discuss steps to quit smoking at next appointment.   IF you received an x-ray today, you will receive an invoice from [facility]. Please contact [facility] at [phone] with questions or concerns regarding your invoice.   IF you received labwork today, you will receive an invoice from [facility]. Please contact [facility] at [phone] with questions or concerns regarding your invoice.   Our billing staff will not be able to assist you with questions regarding bills from these companies.  You will be contacted with the lab results as soon as they are available. The fastest way to get your results is to activate your My Chart account. Instructions are located on the last page of this paperwork. If you have not heard from us regarding the results in 2 weeks, please contact this office.

## 2016-06-09 NOTE — Progress Notes (Signed)
06/09/2016 7:05 PM   DOB: 1971-04-09 / MRN: [id number]  SUBJECTIVE:  Kristie Tapia is a 46 y.o. female presenting for a diabetes recheck.  A1c is improved. She is talking Lantus 12 units qhs along with novolog with meals.  She is upset that she is gaining weight but understands that this can occur with insulin. She is not checking her sugar at home though she has the tools to do so.    She continues to smoke and is willing to talk about reducing or stopping this at her next follow up.   She complains of an ongoing dental infection.  She has follow up with [facility] and is awaiting a phone call for scheduling.  She knows she needs tooth extraction.  Was taking amoxicillin for 10 days starting 05/12/16 and reports this did help however the swelling has returned.  She is worried about acquiring another gingival abscess.       She complains of right lower dental pain and swelling.  Has    For joint pian. She is allergic to adhesive [tape]; sulfa antibiotics; decongestant [pseudoephedrine hcl er]; prednisone; percocet [oxycodone-acetaminophen]; zoloft [sertraline hcl]; and latex.   She  has a past medical history of Anemia; Anxiety; Complication of anesthesia (` 1998); Gallstones; GERD (gastroesophageal reflux disease); H/O hiatal hernia; History of stomach ulcers; Migraines; Sleep apnea; and Type II diabetes mellitus (HCC).    She  reports that she has been smoking Cigarettes.  She has a 29.00 pack-year smoking history. She has never used smokeless tobacco. She reports that she drinks alcohol. She reports that she does not use drugs. She  reports that she currently engages in sexual activity. The patient  has a past surgical history that includes Appendectomy (01/03/2012); Abdominal hysterectomy (08/1998); Dilation and curettage of uterus; Abdominal exploration surgery; Tubal ligation (05/1998); LEEP (1998); and laparoscopic appendectomy (01/03/2012).  Her family history is not on  file.  Review of Systems  Constitutional: Negative for chills and fever.  Eyes: Negative for blurred vision.  Respiratory: Negative for cough and shortness of breath.   Cardiovascular: Negative for chest pain and leg swelling.  Gastrointestinal: Negative for nausea.  Skin: Negative for itching and rash.  Neurological: Negative for sensory change and focal weakness.    The problem list and medications were reviewed and updated by myself where necessary and exist elsewhere in the encounter.   OBJECTIVE:  BP 116/80   Pulse 99   Temp 97.4 F (36.3 C) (Oral)   Resp 16   Ht 5\' 5"  (1.651 m)   Wt 174 lb (78.9 kg)   SpO2 98%   BMI 28.96 kg/m   Physical Exam  Constitutional: She is oriented to person, place, and time. She appears well-nourished. No distress.  HENT:  Head:    Mouth/Throat:    Eyes: EOM are normal. Pupils are equal, round, and reactive to light.  Cardiovascular: Normal rate and regular rhythm.   Pulses:      Dorsalis pedis pulses are 2+ on the right side, and 2+ on the left side.       Posterior tibial pulses are 2+ on the right side, and 2+ on the left side.  Pulmonary/Chest: Effort normal.  Abdominal: She exhibits no distension.  Neurological: She is alert and oriented to person, place, and time. No cranial nerve deficit. Gait normal.  Skin: Skin is dry. She is not diaphoretic.     Psychiatric: She has a normal mood and affect.  Vitals reviewed.  Results for orders placed or performed in visit on 06/09/16 (from the past 72 hour(s))  POCT glycosylated hemoglobin (Hb A1C)     Status: None   Collection Time: 06/09/16  9:17 AM  Result Value Ref Range   Hemoglobin A1C 7.9    Wt Readings from Last 3 Encounters:  06/09/16 174 lb (78.9 kg)  05/12/16 171 lb (77.6 kg)  04/07/16 166 lb (75.3 kg)   Lab Results  Component Value Date   K 5.3 02/02/2016   Lab Results  Component Value Date   CREATININE 0.84 02/02/2016   Lab Results  Component Value Date    ALT 15 02/02/2016   AST 12 02/02/2016   ALKPHOS 82 02/02/2016   BILITOT 0.6 02/02/2016     No results found.  ASSESSMENT AND PLAN:  Kristie Tapia was seen today for follow-up.  Diagnoses and all orders for this visit:  Type 2 diabetes mellitus with hyperglycemia, with long-term current use of insulin [facility](HCC): She is now compliant with therapy which was her major problem before.  I am worried that if she continues to gain weight that she be stop taking her insulin. She won't take metformin due to diarrhea.  She reports yeast infections with SGLT2 inhibitors. Will try trulicity for tighter A1c control.  She will follow up in two months in my appointment clinic. She denies pneumococcal immunization today. She refused to provide a urine and I made it clear I was only screening her for albumin.  -     POCT glycosylated hemoglobin (Hb A1C) -     HM Diabetes Foot Exam -     Lipid panel -     Cancel: Microalbumin, urine -     Renal Function Panel -     Dulaglutide (TRULICITY) 0.75 MG/0.5ML SOPN; Inject 0.75 mg/0.5 ML every week.  Stop eating once you get the slightest sensation of fullness.  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  During her foot exam I also noticed a few mycotic nails.  Her LFTs are normal.  -     fluconazole (DIFLUCAN) 150 MG tablet; Take 1 tablet (150 mg total) by mouth once. Repeat if needed  Gingival erythema: She has had augmentin in the past and did not have any problems.  I have advised of the risk of c-diff and she will return if she starts to suffer with diarrhea.  -     amoxicillin-clavulanate (AUGMENTIN) 875-125 MG tablet; Take 1 tablet by mouth 2 (two) times daily.  Other orders -     Discontinue: amoxicillin-clavulanate (AUGMENTIN) 875-125 MG tablet; Take 1 tablet by mouth 2 (two) times daily. -     Discontinue: Dulaglutide (TRULICITY) 0.75 MG/0.5ML SOPN; Inject 0.75 mg/0.5 ML every week.  Stop eating once you get the slightest sensation of  fullness. -     Discontinue: fluconazole (DIFLUCAN) 150 MG tablet; Take 1 tablet (150 mg total) by mouth once. Repeat if needed    The patient is advised to call or return to clinic if she does not see an improvement in symptoms, or to seek the care of the closest emergency department if she worsens with the above plan.   Deliah BostonMichael Clark, MHS, PA-C [facility] 06/09/2016 7:05 PM

## 2016-06-09 NOTE — Progress Notes (Signed)
Pt called 3 meds were sent to mail order, she needs at local pharm today, re sent

## 2016-06-11 LAB — RENAL FUNCTION PANEL
ALBUMIN: 4.6 g/dL (ref 3.5–5.5)
BUN/Creatinine Ratio: 12 (ref 9–23)
BUN: 10 mg/dL (ref 6–24)
CO2: 24 mmol/L (ref 18–29)
CREATININE: 0.83 mg/dL (ref 0.57–1.00)
Calcium: 10 mg/dL (ref 8.7–10.2)
Chloride: 96 mmol/L (ref 96–106)
GFR, EST AFRICAN AMERICAN: 98 mL/min/{1.73_m2} (ref >59)
GFR, EST NON AFRICAN AMERICAN: 85 mL/min/{1.73_m2} (ref >59)
GLUCOSE: 281 mg/dL — AB (ref 65–99)
PHOSPHORUS: 4.6 mg/dL — AB (ref 2.5–4.5)
Potassium: 5 mmol/L (ref 3.5–5.2)
Sodium: 138 mmol/L (ref 134–144)

## 2016-06-11 LAB — LIPID PANEL
Chol/HDL Ratio: 6.5 ratio units — ABNORMAL HIGH (ref 0.0–4.4)
Cholesterol, Total: 194 mg/dL (ref 100–199)
HDL: 30 mg/dL — AB (ref >39)
LDL Calculated: 108 mg/dL — ABNORMAL HIGH (ref 0–99)
TRIGLYCERIDES: 281 mg/dL — AB (ref 0–149)
VLDL CHOLESTEROL CAL: 56 mg/dL — AB (ref 5–40)

## 2016-06-11 LAB — MICROALBUMIN, URINE

## 2016-06-12 ENCOUNTER — Telehealth: Payer: Self-pay

## 2016-06-12 NOTE — Telephone Encounter (Signed)
trulicity pa started 06/12/16  Dois DavenportSandra d states  5-7 day for response.

## 2016-06-12 NOTE — Telephone Encounter (Signed)
trulicity not covered needs pa [facility] [phone] Id [id number] group [id number]

## 2016-06-13 ENCOUNTER — Telehealth: Payer: Self-pay | Admitting: Physician Assistant

## 2016-06-13 MED ORDER — ROSUVASTATIN CALCIUM 20 MG PO TABS: 20.0000 mg | ORAL_TABLET | Freq: Every day | ORAL | 3 refills | Status: DC

## 2016-06-13 NOTE — Telephone Encounter (Signed)
She needs to be on a statin given her diabetes, dyslipidemia, and HTN.  I am sending to the pharmacy. Please call and inform her. Kristie BostonMichael Makeba Delcastillo, MS, PA-C 6:54 PM, 06/13/2016

## 2016-06-18 NOTE — Telephone Encounter (Signed)
Pt advised.

## 2016-06-22 ENCOUNTER — Ambulatory Visit: Payer: BLUE CROSS/BLUE SHIELD

## 2016-06-23 ENCOUNTER — Ambulatory Visit (INDEPENDENT_AMBULATORY_CARE_PROVIDER_SITE_OTHER): Payer: BLUE CROSS/BLUE SHIELD | Admitting: Physician Assistant

## 2016-06-23 VITALS — BP 140/92 | HR 90 | Temp 97.4°F | Resp 16 | Ht 66.0 in | Wt 178.0 lb

## 2016-06-23 DIAGNOSIS — G43001 Migraine without aura, not intractable, with status migrainosus: Secondary | ICD-10-CM

## 2016-06-23 DIAGNOSIS — G44209 Tension-type headache, unspecified, not intractable: Secondary | ICD-10-CM

## 2016-06-23 DIAGNOSIS — K5900 Constipation, unspecified: Secondary | ICD-10-CM | POA: Diagnosis not present

## 2016-06-23 DIAGNOSIS — Z760 Encounter for issue of repeat prescription: Secondary | ICD-10-CM

## 2016-06-23 DIAGNOSIS — M62838 Other muscle spasm: Secondary | ICD-10-CM

## 2016-06-23 MED ORDER — SUMATRIPTAN SUCCINATE 100 MG PO TABS: 100.0000 mg | ORAL_TABLET | ORAL | 0 refills | Status: DC | PRN

## 2016-06-23 MED ORDER — METAXALONE 800 MG PO TABS: 400.0000 mg | ORAL_TABLET | Freq: Three times a day (TID) | ORAL | 0 refills | Status: DC

## 2016-06-23 MED ORDER — POLYETHYLENE GLYCOL 3350 17 GM/SCOOP PO POWD: 17.0000 g | Freq: Two times a day (BID) | ORAL | 1 refills | Status: DC | PRN

## 2016-06-23 MED ORDER — FLUTICASONE PROPIONATE 50 MCG/ACT NA SUSP: 2.0000 | Freq: Every day | NASAL | 11 refills | Status: DC

## 2016-06-23 MED ORDER — ALBUTEROL SULFATE HFA 108 (90 BASE) MCG/ACT IN AERS: 2.0000 | INHALATION_SPRAY | RESPIRATORY_TRACT | 0 refills | Status: DC | PRN

## 2016-06-23 NOTE — Progress Notes (Signed)
Kristie Tapia  MRN: [id number] DOB: December 29, 1970  Subjective:  Pt presents to clinic with headache that started 10 days ago - it starts in the neck and moves up to the head - it feels like pressure 9/10 at its worse and 2/10 at its best (currently where it is).  She has known stress headaches and migraines - this headache feels like a combination of them.  HA started after she had a day of vomiting which has since subsided.  The muscle in her neck feel sore from that a week ago.  She has used her imitrex which helps for about 2 hours and then the HA returns. She is having some nausea but has phenergan at home for that but she is having no light sensitivity.  She is out of that medication.  She does not have a current neurologist.  She states that nothing we have done in the past helps these headaches, pain medications does not help and makes her feel bad.  She has gotten an IV cocktail from a neurologist in the past that has helped but she is unsure what is in that.  Also no BM in at least 10 days.  She is still passing gas - feels bad but no abdominal pain. She has been using an Exlax everytime she eats without any help.  She drinks almost no water - she drinks sweet tea and Dr Malachi Bonds.  She has a GI provider who has given her low doses miralax in the past which has not helped.  She had a colonoscopy within the last several months that was normal for what they could see but she was not completely enmpty after her prep.  Pt has anxiety and fibromyalgia.  She has been on flexeril in the past which she believes constipated her.  Lyrica makes her feel drunk and gabapentin made her sleepy.  Also needs refills of her albuterol and flonase.  [facility]  Review of Systems  Constitutional: Negative for chills and fever.  Gastrointestinal: Positive for constipation.  Musculoskeletal: Positive for neck pain.  Neurological: Positive for headaches.    There are no active problems to display for this  patient.   Current Outpatient Prescriptions on File Prior to Visit  Medication Sig Dispense Refill  . blood glucose meter kit and supplies Dispense based on patient and insurance preference. Four times daily. Dx: E11.9 1 each 0  . Dulaglutide (TRULICITY) 9.35 TS/1.7BL SOPN Inject 0.75 mg/0.5 ML every week.  Stop eating once you get the slightest sensation of fullness. 4 pen 1  . escitalopram (LEXAPRO) 20 MG tablet TAKE ONE TABLET BY MOUTH ONCE DAILY 90 tablet 1  . esomeprazole (NEXIUM) 40 MG capsule Take 1 capsule (40 mg total) by mouth daily. 30 capsule 3  . insulin aspart (NOVOLOG) 100 UNIT/ML injection Inject into the skin 3 (three) times daily before meals.    . Insulin Glargine (LANTUS SOLOSTAR) 100 UNIT/ML Solostar Pen Inject 12 Units into the skin daily at 10 pm. 5 pen PRN  . LORazepam (ATIVAN) 0.5 MG tablet Take 1 tablet (0.5 mg total) by mouth every 8 (eight) hours as needed. anxiety 30 tablet 0  . nortriptyline (PAMELOR) 25 MG capsule Take 25 mg by mouth at bedtime.    . promethazine (PHENERGAN) 25 MG tablet TAKE ONE TABLET BY MOUTH EVERY 8 HOURS AS NEEDED FOR NAUSEA AND VOMITING 30 tablet 0  . rosuvastatin (CRESTOR) 20 MG tablet Take 1 tablet (20 mg total) by mouth daily.  90 tablet 3  . traZODone (DESYREL) 100 MG tablet Take 1 tablet (100 mg total) by mouth at bedtime. Reported on 09/21/2015 30 tablet 11   No current facility-administered medications on file prior to visit.     Allergies  Allergen Reactions  . Adhesive [Tape] Other (See Comments)    Blisters USE PAPER TAPE ONLY  . Sulfa Antibiotics Other (See Comments)    Burns the inside of my mouth; "blisters; leaves tongue and inside of mouth solid red"  . Decongestant [Pseudoephedrine Hcl Er] Itching    ALL DECONGESTANTS  . Prednisone Other (See Comments)    Gives me a bad attitude  . Percocet [Oxycodone-Acetaminophen] Other (See Comments)    PT reports having nightmares when taking Percocet  . Zoloft [Sertraline Hcl]     . Latex Itching    Pt patients past, family and social history were reviewed and updated.   Objective:  BP (!) 140/92   Pulse 90   Temp 97.4 F (36.3 C) (Oral)   Resp 16   Ht 5' 6"  (1.676 m)   Wt 178 lb (80.7 kg)   SpO2 96%   BMI 28.73 kg/m   Physical Exam  Constitutional: She is oriented to person, place, and time and well-developed, well-nourished, and in no distress.  Pt moving around the room without difficulty  HENT:  Head: Normocephalic and atraumatic.  Right Ear: Hearing and external ear normal.  Left Ear: Hearing and external ear normal.  Eyes: Conjunctivae, EOM and lids are normal. Pupils are equal, round, and reactive to light.  Neck: Normal range of motion.  Cardiovascular: Normal rate, regular rhythm and normal heart sounds.   No murmur heard. Pulmonary/Chest: Effort normal and breath sounds normal. She has no wheezes.  Musculoskeletal:       Cervical back: She exhibits tenderness (muscle) and spasm. She exhibits normal range of motion.  Neurological: She is alert and oriented to person, place, and time. She has normal sensation, normal strength, normal reflexes and intact cranial nerves. She displays no weakness and facial symmetry. No sensory deficit. Gait normal. Coordination and gait normal.  Skin: Skin is warm and dry.  Psychiatric: Mood, memory, affect and judgment normal.  Vitals reviewed.   Assessment and Plan :  Constipation, unspecified constipation type - Plan: polyethylene glycol powder (GLYCOLAX/MIRALAX) powder  Acute non intractable tension-type headache - Plan: SUMAtriptan (IMITREX) 100 MG tablet, Ambulatory referral to Neurology  Muscle spasm - Plan: metaxalone (SKELAXIN) 800 MG tablet  Migraine without aura and with status migrainosus, not intractable - Plan: Ambulatory referral to Neurology  Medication refill - Plan: albuterol (PROVENTIL HFA;VENTOLIN HFA) 108 (90 Base) MCG/ACT inhaler   I am unsure what to do for the patient currently  for her HA as everything I suggests she has tried in the past without help.  I am going to refer her to neurology due to the fact that their cocktail for migraines as helped in the past - I did explain to patient that the ED does have a migraine cocktail that they can do if the HA returns to it highest pain level.  I suspect this HA is multiifactorial - muscle spasms, tension HA, migraine, fibromyalgia, stress and likely dehydration due to lack of good oral hydration - she will increase her oral water intake as that should help her HA and constipation - she will use her phenergan she has at home to help with the nausea - I suspect muscle spasms have a lot to do with  the HA - as she has tried flexeril in the past we will try skelaxin to see if she tolerates it and that will help the neck component of the HA.  Suggested maybe PT to help with muscle component of HA esp with her fibromyalgia history.  She is on nortriptyline which maybe could be increased to help as prevention.  As for constipation - gave her miralax with instructions for a more aggressive treatment regimen explained to her about long term laxative use and need for increase water hydration as well as good fiber in her diet.  She was encouraged to seek further GI care as this is something that she states she suffers from regularly - it is possible some of her mediations are making it worse.  Windell Hummingbird PA-C  [facility] 06/24/2016 2:07 AM

## 2016-06-23 NOTE — Patient Instructions (Signed)
For constipation   Make sure you are drinking enough water daily. Make sure you are getting enough fiber in your diet - this will make you regular - you can eat high fiber foods or use metamucil as a supplement - it is really important to drink enough water when using fiber supplements.  If your stools are hard or are formed balls or you have to strain a stool softener will help - use colace 2-3 capsule a day  For gentle treatment of constipation Use Miralax 1-2 capfuls a day until your stools are soft and regular and then decrease the usage - you can use this daily  For more aggressive treatment of constipation Use 4 capfuls of Colace and 6 doses of Miralax and drink it in 2 hours - this should result in several watery stools - if it does not repeat the next day and then go to daily miralax for a week to make sure your bowels are clean and retrained to work properly  For the most aggressive treatment of constipation Use 14 capfuls of Miralax in 1 gallon of fluid (gatoraid or water work well or a combination of the two) and drink over 12h - it is ok to eat during this time and then use Miralax 1 capful daily for about 2 weeks to prevent the constipation from returning 

## 2016-07-04 ENCOUNTER — Telehealth: Payer: Self-pay

## 2016-07-04 NOTE — Telephone Encounter (Signed)
I dont see these meds on her list?  Please advise Saw weber 06/23/16 but clarks patient

## 2016-07-04 NOTE — Telephone Encounter (Signed)
I've not discussed these medications with her so can't support any refills with documentation.  Will route to PA Weber and if she did not discuss either of this with patient this she will need to come back to discuss these. Deliah BostonMichael Demarus Latterell, MS, PA-C 4:31 PM, 07/04/2016

## 2016-07-04 NOTE — Telephone Encounter (Signed)
THIS CALL IS FROM ANGELINE AT [facility]: SHE FAXED A REFILL REQUEST ON 06/18/16 AND WHEN SHE CALLED BACK SHE WAS TOLD THAT WE DID RECEIVE IT. SHE WOULD LIKE TO KNOW IF IT HAS BEEN FAXED BACK TO THEM? PATIENT NEEDS TO GET REFILLS ON THE OMEGA 3 AND LIDOCAINE OINTMENT. BEST PHONE [phone] (PLEASE ASK FOR ANGELINE) THE REFERENCE NUMBER IS [id number]. MBC

## 2016-07-05 NOTE — Telephone Encounter (Signed)
I did not Rx these are the patient.

## 2016-07-10 ENCOUNTER — Telehealth: Payer: Self-pay | Admitting: Family Medicine

## 2016-07-10 NOTE — Telephone Encounter (Signed)
Sent to [facility] 06/13/16

## 2016-07-10 NOTE — Telephone Encounter (Signed)
L/m to call back for an appt. Any other advice?

## 2016-07-10 NOTE — Telephone Encounter (Signed)
Negative.  We will have to see her.  Deliah BostonMichael Marvelous Bouwens, MS, PA-C 6:29 PM, 07/10/2016

## 2016-07-10 NOTE — Telephone Encounter (Signed)
Pt seen dr Chestine Sporeclark and needs another antibiotic for her abcess was prescribed an antibiotic and abcess never really went away not its bigger..  Please advise [phone]

## 2016-07-10 NOTE — Telephone Encounter (Signed)
Pt stating that Kristie Tapia was going to send a Statin drug to her pharmacy please respond

## 2016-07-12 NOTE — Telephone Encounter (Signed)
L/m to call back for an office visit now

## 2016-07-14 ENCOUNTER — Ambulatory Visit (INDEPENDENT_AMBULATORY_CARE_PROVIDER_SITE_OTHER): Payer: BLUE CROSS/BLUE SHIELD | Admitting: Physician Assistant

## 2016-07-14 ENCOUNTER — Ambulatory Visit (INDEPENDENT_AMBULATORY_CARE_PROVIDER_SITE_OTHER): Payer: BLUE CROSS/BLUE SHIELD

## 2016-07-14 VITALS — BP 126/80 | HR 83 | Temp 97.0°F | Resp 16 | Ht 65.5 in | Wt 180.4 lb

## 2016-07-14 DIAGNOSIS — R6884 Jaw pain: Secondary | ICD-10-CM

## 2016-07-14 DIAGNOSIS — G8929 Other chronic pain: Secondary | ICD-10-CM | POA: Diagnosis not present

## 2016-07-14 DIAGNOSIS — E118 Type 2 diabetes mellitus with unspecified complications: Secondary | ICD-10-CM

## 2016-07-14 DIAGNOSIS — Z794 Long term (current) use of insulin: Secondary | ICD-10-CM | POA: Diagnosis not present

## 2016-07-14 DIAGNOSIS — E1165 Type 2 diabetes mellitus with hyperglycemia: Secondary | ICD-10-CM

## 2016-07-14 LAB — POCT GLYCOSYLATED HEMOGLOBIN (HGB A1C): HEMOGLOBIN A1C: 9.4

## 2016-07-14 MED ORDER — DULAGLUTIDE 0.75 MG/0.5ML ~~LOC~~ SOAJ: SUBCUTANEOUS | 1 refills | Status: DC

## 2016-07-14 MED ORDER — GABAPENTIN 300 MG PO CAPS: ORAL_CAPSULE | ORAL | 3 refills | Status: DC

## 2016-07-14 MED ORDER — ROSUVASTATIN CALCIUM 40 MG PO TABS: 40.0000 mg | ORAL_TABLET | Freq: Every day | ORAL | 3 refills | Status: DC

## 2016-07-14 NOTE — Progress Notes (Signed)
07/14/2016 3:41 PM   DOB: 08-01-1970 / MRN: [id number]  SUBJECTIVE:  Kristie Tapia is a 46 y.o. female presenting for headache and continued mouth pain.  She has been tried on Augmentin BID for 10 days and this is not improving.    She has a history of poorly controlled diabetes and her A1c has gotten worse since starting trulicity.  She has had medication compliance issues in the past. She is taking 16 units of long acting and ten units of short acting with food.   She has a HA today.  The HA is right sided and contant.  She tried muscle relaxers and this has not helped. This HA has been present for about 1 month.  She denies weakness in her hands and feet. She has also tried Aleve 2 tabs, and she has also tried Ibuprofen.   She is allergic to adhesive [tape]; sulfa antibiotics; decongestant [pseudoephedrine hcl er]; prednisone; percocet [oxycodone-acetaminophen]; zoloft [sertraline hcl]; and latex.   She  has a past medical history of Anemia; Anxiety; Complication of anesthesia (` 1998); Gallstones; GERD (gastroesophageal reflux disease); H/O hiatal hernia; History of stomach ulcers; Migraines; Sleep apnea; and Type II diabetes mellitus (HCC).    She  reports that she has been smoking Cigarettes.  She has a 29.00 pack-year smoking history. She has never used smokeless tobacco. She reports that she drinks alcohol. She reports that she does not use drugs. She  reports that she currently engages in sexual activity. The patient  has a past surgical history that includes Appendectomy (01/03/2012); Abdominal hysterectomy (08/1998); Dilation and curettage of uterus; Abdominal exploration surgery; Tubal ligation (05/1998); LEEP (1998); and laparoscopic appendectomy (01/03/2012).  Her family history is not on file.  Review of Systems  Constitutional: Negative for chills and fever.  Eyes: Negative for blurred vision and double vision.  Cardiovascular: Negative for chest pain.  Skin: Negative for itching  and rash.  Neurological: Positive for headaches.    The problem list and medications were reviewed and updated by myself where necessary and exist elsewhere in the encounter.   OBJECTIVE:  BP 126/80   Pulse 83   Temp 97 F (36.1 C)   Resp 16   Ht 5' 5.5" (1.664 m)   Wt 180 lb 6.4 oz (81.8 kg)   SpO2 99%   BMI 29.56 kg/m   Physical Exam  Constitutional: She is oriented to person, place, and time.  HENT:  Mouth/Throat: Uvula is midline, oropharynx is clear and moist and mucous membranes are normal.    Cardiovascular: Normal rate and regular rhythm.   Pulmonary/Chest: Effort normal and breath sounds normal.  Musculoskeletal: Normal range of motion.  Neurological: She is alert and oriented to person, place, and time. She displays normal reflexes. No cranial nerve deficit. She exhibits normal muscle tone. Coordination normal.  Skin: Skin is warm.    Results for orders placed or performed in visit on 07/14/16 (from the past 72 hour(s))  POCT glycosylated hemoglobin (Hb A1C)     Status: None   Collection Time: 07/14/16  2:32 PM  Result Value Ref Range   Hemoglobin A1C 9.4    Lab Results  Component Value Date   CREATININE 0.83 06/09/2016   Dg Mandible 1-3 Views  Result Date: 07/14/2016 CLINICAL DATA:  Right-sided mandibular pain EXAM: MANDIBLE - 1-3 VIEW COMPARISON:  None. FINDINGS: No acute fracture or dislocation is noted. Significant dental disease is noted particularly in the right mandibular molars. IMPRESSION: Significant dental caries. No acute  fracture or dislocation is seen. Electronically Signed   By: Alcide CleverMark  Lukens M.D.   On: 07/14/2016 14:36     ASSESSMENT AND PLAN:  Kristie Tapia was seen today for abscess and headache.  Diagnoses and all orders for this visit:  Type 2 diabetes mellitus with complication, with long-term current use of insulin (HCC): She has not started trulicity or statin.  She continues to smoke.  She does not want to see endo. She is willing to try  trulicity today and will start the crestor.  I will see her back in one month.   -     POCT glycosylated hemoglobin (Hb A1C)  Chronic jaw pain: Will try her on gabapentin for this as well as persistent HA.  Her teeth are rotting.  Advised that she must see a dentist.   -     DG Mandible 1-3 Views; Future     The patient is advised to call or return to clinic if she does not see an improvement in symptoms, or to seek the care of the closest emergency department if she worsens with the above plan.   Deliah BostonMichael Oniya Mandarino, MHS, PA-C [facility] 07/14/2016 3:41 PM

## 2016-07-14 NOTE — Patient Instructions (Signed)
     IF you received an x-ray today, you will receive an invoice from [facility]. Please contact [facility] at [phone] with questions or concerns regarding your invoice.   IF you received labwork today, you will receive an invoice from [facility]. Please contact [facility] at [phone] with questions or concerns regarding your invoice.   Our billing staff will not be able to assist you with questions regarding bills from these companies.  You will be contacted with the lab results as soon as they are available. The fastest way to get your results is to activate your My Chart account. Instructions are located on the last page of this paperwork. If you have not heard from us regarding the results in 2 weeks, please contact this office.     

## 2016-07-16 ENCOUNTER — Telehealth: Payer: Self-pay

## 2016-07-16 ENCOUNTER — Other Ambulatory Visit: Payer: Self-pay | Admitting: Physician Assistant

## 2016-07-16 DIAGNOSIS — R1013 Epigastric pain: Principal | ICD-10-CM

## 2016-07-16 DIAGNOSIS — G8929 Other chronic pain: Secondary | ICD-10-CM

## 2016-07-16 DIAGNOSIS — G44209 Tension-type headache, unspecified, not intractable: Secondary | ICD-10-CM

## 2016-07-16 NOTE — Telephone Encounter (Signed)
Patient is at [facility] on [address] and needs the prior auth for her medicine. Explained it might not be tonight but patient was upset stating that Casimiro NeedleMichael told her to call him personally immediatly if she had trouble getting her medicines filled and she doesn't understand why would not let him get on the phone with her.  Her call back number is [phone]

## 2016-07-16 NOTE — Telephone Encounter (Signed)
I do not see a prior authorization initiated on this patient.  Kristie Tapia, Please clarify the gabapentin dosing, then forward to the clinical message pool for the prior authorization.

## 2016-07-16 NOTE — Telephone Encounter (Signed)
PATIENT WOULD LIKE TO KNOW IF HER PRIOR AUTHORIZATION ON TRULICITY 0.75MG /0.5 ML HAS BEEN DONE YET? SHE ALSO SAID THE PHARMACIST NEEDS TO VERIFY THE DIRECTIONS ON THE NEURONTIN 300 MG WITH MICHAEL CLARK. BEST PHONE [phone] (CELL) PHARMACY CHOICE IS [facility] ON [address]. MBC

## 2016-07-17 NOTE — Telephone Encounter (Signed)
Patient last seen by PA-Clark on 07/14/2016 for headache among other symptoms. Refill sent for Imitrex given patient's history of migraines. I recommended she come in for a recheck if h/a's persist.

## 2016-07-19 ENCOUNTER — Other Ambulatory Visit: Payer: Self-pay | Admitting: Physician Assistant

## 2016-07-19 NOTE — Telephone Encounter (Signed)
I've fixed the gabapentin. Please call patient and let her know.  Please initiate a prior auth for trulicity. Thank you.

## 2016-07-19 NOTE — Telephone Encounter (Signed)
Pt calling back to check on rx. States that the pharmacy still needs clarification and pre auth as noted below.

## 2016-07-21 NOTE — Telephone Encounter (Signed)
[phone] is next step for [facility] [place]

## 2016-07-30 NOTE — Telephone Encounter (Signed)
Form completed and placed in [employer] Clark's box for signature.

## 2016-07-30 NOTE — Telephone Encounter (Signed)
Called # for [employer].  They stated they do not do prior auths over the phone any longer.  I told them cover my meds site is down today.  They will fax us a form to complete.  After they receive completed form, it takes 72 hrs for response.

## 2016-08-13 ENCOUNTER — Ambulatory Visit: Payer: BLUE CROSS/BLUE SHIELD | Admitting: Neurology

## 2016-09-17 ENCOUNTER — Other Ambulatory Visit: Payer: Self-pay | Admitting: Physician Assistant

## 2016-09-17 ENCOUNTER — Other Ambulatory Visit: Payer: Self-pay | Admitting: Family Medicine

## 2016-09-17 DIAGNOSIS — G8929 Other chronic pain: Secondary | ICD-10-CM

## 2016-09-17 DIAGNOSIS — R1013 Epigastric pain: Secondary | ICD-10-CM

## 2016-09-17 DIAGNOSIS — K130 Diseases of lips: Secondary | ICD-10-CM

## 2016-09-17 DIAGNOSIS — Z760 Encounter for issue of repeat prescription: Secondary | ICD-10-CM

## 2016-09-17 NOTE — Telephone Encounter (Signed)
Kristie Tapia - looks like I just courtesy refilled this for her once sev wks after I saw her for an acute issue so am going to go ahead and turf further refills of this long-term med to you. Thanks, Carley Hammed

## 2016-10-11 ENCOUNTER — Ambulatory Visit (INDEPENDENT_AMBULATORY_CARE_PROVIDER_SITE_OTHER): Payer: BLUE CROSS/BLUE SHIELD | Admitting: Neurology

## 2016-10-11 ENCOUNTER — Encounter: Payer: Self-pay | Admitting: Neurology

## 2016-10-11 VITALS — BP 102/64 | HR 86 | Temp 97.6°F | Ht 65.75 in | Wt 171.0 lb

## 2016-10-11 DIAGNOSIS — G43109 Migraine with aura, not intractable, without status migrainosus: Secondary | ICD-10-CM

## 2016-10-11 MED ORDER — NAPROXEN 500 MG PO TABS: ORAL_TABLET | ORAL | 2 refills | Status: DC

## 2016-10-11 MED ORDER — NORTRIPTYLINE HCL 50 MG PO CAPS: 50.0000 mg | ORAL_CAPSULE | Freq: Every day | ORAL | 2 refills | Status: DC

## 2016-10-11 NOTE — Progress Notes (Signed)
NEUROLOGY CONSULTATION NOTE  Kristie Tapia MRN: [id number] DOB: [birth year]  Referring provider: Philis Fendt, PA-C Primary care provider: Philis Fendt, PA-C  Reason for consult:  migraines  HISTORY OF PRESENT ILLNESS: Kristie Tapia is a 46 year old female with type 2 diabetes, fibromyalgia, and anxiety who presents for headache.  History supplemented by IM note.  Onset:  6th grade Location:  Bi-temporal/parietal and travels to occiput and back of neck Quality:  pressure Intensity:  7-8/10 Aura:  Squiggly lines in vision of one or both eyes for 30 minutes prior to onset of headache and then resolves. Prodrome:  no Associated symptoms:  Nausea, photophobia, phonopohobia.  She has not had any new worse headache of her life, waking up from sleep Duration:  All day (used to respond to sumatriptan 93m in 1 hour) Frequency:  Varies.  Some months it may be daily, sometimes once a week Frequency of abortive medication: 1 to 2 days a week Triggers/exacerbating factors:  stress Relieving factors:  sleep Activity:  aggravates  Past NSAIDS:  ibuprofen Past analgesics:  tramadol, oxycodone Past abortive triptans:  sumatriptan 1092m Maxalt 64m78mast muscle relaxants:  Robaxin, tizanidine Past anti-emetic:  Zofran Past antihypertensive medications:  no Past antidepressant medications:  no Past anticonvulsant medications:  topiramate (helped in conjunction with other medications that she can't remember) Past vitamins/Herbal/Supplements:  no Past antihistamines/decongestants:  no Other past therapies:  no  Current NSAIDS:  no Current analgesics:  1st Line Excedrin Migraine and Tension Current triptans:  2nd line sumatriptan 53m82moes to sleep and when she wakes up, headache still present but less intense) Current anti-emetic:  promethazine 264mg28mrent muscle relaxants:  Skelaxin Current anti-anxiolytic:  no Current sleep aide:  trazodone Current Antihypertensive medications:   no Current Antidepressant medications:  Lexapro 20mg,53mtriptyline 264mg C72mnt Anticonvulsant medications:  Gabapentin 600mg tw64mdaily Current Vitamins/Herbal/Supplements:  Omega 3 Current Antihistamines/Decongestants:  Flonase Other therapy:  no  Caffeine:  Dr. Pepper, Malachi Bondstea Smoker:  yes Diet:  Dr. Pepper, Malachi Bondstea.  No water. Exercise:  no Depression/anxiety:  anxiety Sleep hygiene:  poor Family history of headache:  no  To assess right sided mandibular pain, an X-ray of the mandible was performed on 07/14/16, which was personally reviewed and revealed significant dental disease in the right mandibular molars.  06/09/16:  Renal function: BMP with Na 138, K 5, Cl 96, CO2 24, Ca 10, glucose 281, BUN 10 and Cr 0.83.  PAST MEDICAL HISTORY: Past Medical History:  Diagnosis Date  . Anemia   . Anxiety   . Complication of anesthesia ` 1998   "lungs collapsed during LEEP procedure"  . Gallstones   . GERD (gastroesophageal reflux disease)   . H/O hiatal hernia   . History of stomach ulcers   . Migraines   . Sleep apnea   . Type II diabetes mellitus ([facility])    [place] [facility]: Past Surgical History:  Procedure Laterality Date  . ABDOMINAL EXPLORATION SURGERY     "I've had several"  . ABDOMINAL HYSTERECTOMY  08/1998  . APPENDECTOMY  01/03/2012  . DILATION AND CURETTAGE OF UTERUS    . LAPAROSCOPIC APPENDECTOMY  01/03/2012   Procedure: APPENDECTOMY LAPAROSCOPIC;  Surgeon: BenjaminEdward Jollyocation: [facility] OR;  [place]: General;  Laterality: N/A;  . LEEP  1998   "lungs collapsed"  . TUBAL LIGATION  05/1998    MEDICATIONS: Current Outpatient Prescriptions on File Prior to Visit  Medication Sig Dispense Refill  . blood  glucose meter kit and supplies Dispense based on patient and insurance preference. Four times daily. Dx: E11.9 1 each 0  . escitalopram (LEXAPRO) 20 MG tablet TAKE ONE TABLET BY MOUTH ONCE DAILY 90 tablet 1  . esomeprazole (NEXIUM) 40 MG capsule TAKE  ONE CAPSULE BY MOUTH ONCE DAILY 90 capsule 0  . fluticasone (FLONASE) 50 MCG/ACT nasal spray Place 2 sprays into both nostrils daily. 16 g 11  . gabapentin (NEURONTIN) 300 MG capsule Take 1 tab daily for three days, the 1 tab in the morning and night for three days, then 2 tabs in the morning and night for three days. (Patient taking differently: 6103m twice a day) 120 capsule 3  . insulin aspart (NOVOLOG) 100 UNIT/ML injection Inject into the skin 3 (three) times daily before meals.    . Insulin Glargine (LANTUS SOLOSTAR) 100 UNIT/ML Solostar Pen Inject 12 Units into the skin daily at 10 pm. 5 pen PRN  . LORazepam (ATIVAN) 0.5 MG tablet Take 1 tablet (0.5 mg total) by mouth every 8 (eight) hours as needed. anxiety 30 tablet 0  . metaxalone (SKELAXIN) 800 MG tablet Take 0.5-1 tablets (400-800 mg total) by mouth 3 (three) times daily. 60 tablet 0  . polyethylene glycol powder (GLYCOLAX/MIRALAX) powder Take 17 g by mouth 2 (two) times daily as needed. 500 g 1  . promethazine (PHENERGAN) 25 MG tablet TAKE ONE TABLET BY MOUTH EVERY 8 HOURS AS NEEDED FOR NAUSEA AND VOMITING 30 tablet 0  . rosuvastatin (CRESTOR) 40 MG tablet Take 1 tablet (40 mg total) by mouth daily. 90 tablet 3  . SUMAtriptan (IMITREX) 100 MG tablet TAKE ONE TABLET BY MOUTH EVERY 2 HOURS AS NEEDED FOR  MIGRAINE.  MAY  REPEAT  IN  2  HOURS  IF  HEADACHE  PERSISTS  OR  RECURS 30 tablet 0  . traZODone (DESYREL) 100 MG tablet Take 1 tablet (100 mg total) by mouth at bedtime. Reported on 09/21/2015 30 tablet 11   No current facility-administered medications on file prior to visit.     ALLERGIES: Allergies  Allergen Reactions  . Adhesive [Tape] Other (See Comments)    Blisters USE PAPER TAPE ONLY  . Sulfa Antibiotics Other (See Comments)    Burns the inside of my mouth; "blisters; leaves tongue and inside of mouth solid red"  . Decongestant [Pseudoephedrine Hcl Er] Itching    ALL DECONGESTANTS  . Prednisone Other (See Comments)     Gives me a bad attitude  . Percocet [Oxycodone-Acetaminophen] Other (See Comments)    PT reports having nightmares when taking Percocet  . Zoloft [Sertraline Hcl]   . Latex Itching    FAMILY HISTORY: Family History  Problem Relation Age of Onset  . Breast cancer Unknown     SOCIAL HISTORY: Social History   Social History  . Marital status: Single    Spouse name: N/A  . Number of children: N/A  . Years of education: N/A   Occupational History  . Not on file.   Social History Main Topics  . Smoking status: Current Every Day Smoker    Packs/day: 1.00    Years: 29.00    Types: Cigarettes  . Smokeless tobacco: Never Used  . Alcohol use Yes     Comment: 01/03/2012 "6pk would last me a year"  . Drug use: No  . Sexual activity: Yes   Other Topics Concern  . Not on file   Social History Narrative  . No narrative on file  REVIEW OF SYSTEMS: Constitutional: No fevers, chills, or sweats, no generalized fatigue, change in appetite Eyes: No visual changes, double vision, eye pain Ear, nose and throat: No hearing loss, ear pain, nasal congestion, sore throat Cardiovascular: No chest pain, palpitations Respiratory:  No shortness of breath at rest or with exertion, wheezes GastrointestinaI: nausea Genitourinary:  No dysuria, urinary retention or frequency Musculoskeletal:  Neck pain Integumentary: No rash, pruritus, skin lesions Neurological: as above Psychiatric: insomnia, anxiety Endocrine: No palpitations, fatigue, diaphoresis, mood swings, change in appetite, change in weight, increased thirst Hematologic/Lymphatic:  No purpura, petechiae. Allergic/Immunologic: no itchy/runny eyes, nasal congestion, recent allergic reactions, rashes  PHYSICAL EXAM: Vitals:   10/11/16 0942  BP: 102/64  Pulse: 86  Temp: 97.6 F (36.4 C)   General: No acute distress.   Head:  Normocephalic/atraumatic Eyes:  fundi examined but not visualized Neck: supple, bilateral paraspinal  tenderness, full range of motion Back: No paraspinal tenderness Heart: regular rate and rhythm Lungs: Clear to auscultation bilaterally. Vascular: No carotid bruits. Neurological Exam: Mental status: alert and oriented to person, place, and time, recent and remote memory intact, fund of knowledge intact, attention and concentration intact, speech fluent and not dysarthric, language intact. Cranial nerves: CN I: not tested CN II: pupils equal, round and reactive to light, visual fields intact CN III, IV, VI:  full range of motion, no nystagmus, no ptosis CN V: facial sensation intact CN VII: upper and lower face symmetric CN VIII: hearing intact CN IX, X: gag intact, uvula midline CN XI: sternocleidomastoid and trapezius muscles intact CN XII: tongue midline Bulk & Tone: normal, no fasciculations. Motor:  5/5 throughout  Sensation: Decreased temperature in right foot (from injury), and vibration sensation intact. . Deep Tendon Reflexes:  2+ throughout, toes downgoing.  Finger to nose testing:  Without dysmetria.  Heel to shin:  Without dysmetria.  Gait:  Normal station and stride.  Able to turn and tandem walk. Romberg negative.  IMPRESSION: Migraine with aura  PLAN: 1.  Increase nortriptyline to 29m at bedtime.  May increase dose in 4 weeks if needed. 2.  Take sumatriptan 1051mwith naproxen 50081mor abortive therapy 3.  Lifestyle modification:  Sleep hygiene, smoking cessation discussed, exercise, stress/anxiety reduction, diet, discontinue caffeine, hydration 4.  Consider supplements:  Mg, riboflavin, CoQ10 5.  Follow up in 3 months.  Thank you for allowing me to take part in the care of this patient.  AdaMetta ClinesO  CC:  MicPhilis FendtA-C

## 2016-10-11 NOTE — Patient Instructions (Signed)
Migraine Recommendations: 1.  We will increase nortriptyline to 50mg  at bedtime.  Call in 4 weeks with update and we can adjust dose if needed. 2.  Stop Excedrin.  Take one sumatriptan with naproxen 500mg  at earliest onset of headache.  May repeat dose once in 2 hours if needed.  Do not exceed two doses in 24 hours.  Contact me if this doesn't work. 3.  Limit use of pain relievers to no more than 2 days out of the week.  These medications include acetaminophen, ibuprofen, triptans and narcotics.  This will help reduce risk of rebound headaches. 4.  Be aware of common food triggers such as processed sweets, processed foods with nitrites (such as deli meat, hot dogs, sausages), foods with MSG, alcohol (such as wine), chocolate, certain cheeses, certain fruits (dried fruits, some citrus fruit), vinegar, diet soda. 4.  Avoid caffeine 5.  Routine exercise 6.  Proper sleep hygiene 7.  Stay adequately hydrated with water 8.  Keep a headache diary. 9.  Maintain proper stress management. 10.  Do not skip meals. 11.  Consider supplements:  Magnesium citrate 400mg  to 600mg  daily, riboflavin 400mg , Coenzyme Q 10 100mg  three times daily 12.  Try to quit smoking 13.  Follow up in 3 months but contact me in 4 weeks with update.

## 2016-10-28 ENCOUNTER — Other Ambulatory Visit: Payer: Self-pay | Admitting: Physician Assistant

## 2016-10-28 DIAGNOSIS — G8929 Other chronic pain: Secondary | ICD-10-CM

## 2016-10-28 DIAGNOSIS — R1013 Epigastric pain: Principal | ICD-10-CM

## 2016-11-07 ENCOUNTER — Other Ambulatory Visit: Payer: Self-pay | Admitting: Emergency Medicine

## 2016-11-23 ENCOUNTER — Other Ambulatory Visit: Payer: Self-pay | Admitting: Physician Assistant

## 2016-11-23 DIAGNOSIS — Z760 Encounter for issue of repeat prescription: Secondary | ICD-10-CM

## 2016-11-23 DIAGNOSIS — R1013 Epigastric pain: Principal | ICD-10-CM

## 2016-11-23 DIAGNOSIS — G8929 Other chronic pain: Secondary | ICD-10-CM

## 2016-11-24 ENCOUNTER — Encounter: Payer: Self-pay | Admitting: Physician Assistant

## 2016-11-24 ENCOUNTER — Ambulatory Visit (INDEPENDENT_AMBULATORY_CARE_PROVIDER_SITE_OTHER): Payer: BLUE CROSS/BLUE SHIELD | Admitting: Physician Assistant

## 2016-11-24 VITALS — BP 106/71 | HR 83 | Temp 98.2°F | Resp 18 | Ht 65.75 in | Wt 165.8 lb

## 2016-11-24 DIAGNOSIS — M47816 Spondylosis without myelopathy or radiculopathy, lumbar region: Secondary | ICD-10-CM

## 2016-11-24 DIAGNOSIS — R1013 Epigastric pain: Secondary | ICD-10-CM

## 2016-11-24 DIAGNOSIS — M25552 Pain in left hip: Secondary | ICD-10-CM | POA: Diagnosis not present

## 2016-11-24 DIAGNOSIS — J9801 Acute bronchospasm: Secondary | ICD-10-CM | POA: Diagnosis not present

## 2016-11-24 DIAGNOSIS — M545 Low back pain, unspecified: Secondary | ICD-10-CM

## 2016-11-24 DIAGNOSIS — G8929 Other chronic pain: Secondary | ICD-10-CM | POA: Diagnosis not present

## 2016-11-24 DIAGNOSIS — M25551 Pain in right hip: Secondary | ICD-10-CM | POA: Diagnosis not present

## 2016-11-24 DIAGNOSIS — J3089 Other allergic rhinitis: Secondary | ICD-10-CM | POA: Diagnosis not present

## 2016-11-24 MED ORDER — ALBUTEROL SULFATE HFA 108 (90 BASE) MCG/ACT IN AERS: 2.0000 | INHALATION_SPRAY | Freq: Four times a day (QID) | RESPIRATORY_TRACT | 0 refills | Status: DC | PRN

## 2016-11-24 MED ORDER — MELOXICAM 7.5 MG PO TABS: 7.5000 mg | ORAL_TABLET | Freq: Every day | ORAL | 0 refills | Status: DC

## 2016-11-24 MED ORDER — FLUTICASONE PROPIONATE 50 MCG/ACT NA SUSP: 2.0000 | Freq: Every day | NASAL | 4 refills | Status: DC

## 2016-11-24 MED ORDER — CYCLOBENZAPRINE HCL 5 MG PO TABS: 5.0000 mg | ORAL_TABLET | Freq: Three times a day (TID) | ORAL | 0 refills | Status: DC | PRN

## 2016-11-24 MED ORDER — ESOMEPRAZOLE MAGNESIUM 40 MG PO CPDR: 40.0000 mg | DELAYED_RELEASE_CAPSULE | Freq: Every day | ORAL | 0 refills | Status: DC

## 2016-11-24 NOTE — Progress Notes (Signed)
Kristie Tapia  MRN: [id number] DOB: [birth year]  PCP: Tereasa Coop, PA-C  Chief Complaint  Patient presents with  . Back Pain    lower back   has been going on for awhile but has gotten worse   . Hip Pain    both hips   . Medication Refill    nexuim, flonase, and pro air    Subjective:  Pt presents to clinic for low back and hip pain (right>left).  She has chronic back pain but this current pain has been worsening over the last year.  She has used aleve, muscle relaxers.  She has pain with sitting and standing but is worse when she changes positions.  She has not had any imaging in the last 5 years - her MRI showed degenerative changes that she had 5 years ago - she has seen Raliegh Ip in the past for her back.  She has been on Robaxin and skelaxin and neither of them help her pain.  She has h/o nerve problems along with fibromyalgia. She has reduced her medications as she has felt fuzzy headed on them but at the reduced rate she is able to tolerate them.  She reduced her gabapentin 620m qhs - she was on bid but made her head feel funny.  She also needs refills of some of her daily medications.  Review of Systems  Musculoskeletal: Positive for back pain. Negative for gait problem.    There are no active problems to display for this patient.   Current Outpatient Prescriptions on File Prior to Visit  Medication Sig Dispense Refill  . blood glucose meter kit and supplies Dispense based on patient and insurance preference. Four times daily. Dx: E11.9 1 each 0  . escitalopram (LEXAPRO) 20 MG tablet TAKE ONE TABLET BY MOUTH ONCE DAILY 90 tablet 1  . gabapentin (NEURONTIN) 300 MG capsule Take 1 tab daily for three days, the 1 tab in the morning and night for three days, then 2 tabs in the morning and night for three days. (Patient taking differently: 605mtwice a day) 120 capsule 3  . insulin aspart (NOVOLOG) 100 UNIT/ML injection Inject into the skin 3 (three) times daily  before meals.    . Insulin Glargine (LANTUS SOLOSTAR) 100 UNIT/ML Solostar Pen Inject 12 Units into the skin daily at 10 pm. 5 pen PRN  . LORazepam (ATIVAN) 0.5 MG tablet Take 1 tablet (0.5 mg total) by mouth every 8 (eight) hours as needed. anxiety 30 tablet 0  . naproxen (NAPROSYN) 500 MG tablet Take 1 tablet with sumatriptan.  May repeat once with sumatriptan if needed. 16 tablet 2  . nortriptyline (PAMELOR) 50 MG capsule Take 1 capsule (50 mg total) by mouth at bedtime. (Patient taking differently: Take 25 mg by mouth at bedtime. ) 30 capsule 2  . polyethylene glycol powder (GLYCOLAX/MIRALAX) powder Take 17 g by mouth 2 (two) times daily as needed. 500 g 1  . promethazine (PHENERGAN) 25 MG tablet TAKE ONE TABLET BY MOUTH EVERY 8 HOURS AS NEEDED FOR NAUSEA AND VOMITING 30 tablet 0  . rosuvastatin (CRESTOR) 40 MG tablet Take 1 tablet (40 mg total) by mouth daily. 90 tablet 3  . SUMAtriptan (IMITREX) 100 MG tablet TAKE ONE TABLET BY MOUTH EVERY 2 HOURS AS NEEDED FOR  MIGRAINE.  MAY  REPEAT  IN  2  HOURS  IF  HEADACHE  PERSISTS  OR  RECURS 30 tablet 0  . traZODone (DESYREL) 100 MG tablet TAKE ONE  TABLET BY MOUTH AT BEDTIME 30 tablet 11   No current facility-administered medications on file prior to visit.     Allergies  Allergen Reactions  . Adhesive [Tape] Other (See Comments)    Blisters USE PAPER TAPE ONLY  . Sulfa Antibiotics Other (See Comments)    Burns the inside of my mouth; "blisters; leaves tongue and inside of mouth solid red"  . Decongestant [Pseudoephedrine Hcl Er] Itching    ALL DECONGESTANTS  . Prednisone Other (See Comments)    Gives me a bad attitude  . Percocet [Oxycodone-Acetaminophen] Other (See Comments)    PT reports having nightmares when taking Percocet  . Zoloft [Sertraline Hcl]   . Latex Itching    Pt patients past, family and social history were reviewed and updated.   Objective:  BP 106/71   Pulse 83   Temp 98.2 F (36.8 C) (Oral)   Resp 18   Ht 5'  5.75" (1.67 m)   Wt 165 lb 12.8 oz (75.2 kg)   SpO2 94%   BMI 26.96 kg/m   Physical Exam  Constitutional: She is oriented to person, place, and time and well-developed, well-nourished, and in no distress.  HENT:  Head: Normocephalic and atraumatic.  Right Ear: Hearing and external ear normal.  Left Ear: Hearing and external ear normal.  Eyes: Conjunctivae are normal.  Neck: Normal range of motion.  Pulmonary/Chest: Effort normal.  Musculoskeletal:       Right hip: She exhibits normal range of motion, normal strength and no tenderness.       Left hip: She exhibits normal range of motion, normal strength and no tenderness.       Lumbar back: She exhibits decreased range of motion and tenderness. She exhibits no spasm.  Pt is able to get up from bed and move relatively quickly and easily.  No pain with stress of SI joints.  Neurological: She is alert and oriented to person, place, and time. She has normal sensation, normal strength and normal reflexes. She displays no weakness. She has a normal Straight Leg Raise Test. Gait normal. Gait normal.  Skin: Skin is warm and dry.  Psychiatric: Mood, memory, affect and judgment normal.  Vitals reviewed.   Assessment and Plan :  Osteoarthritis of lumbar spine, unspecified spinal osteoarthritis complication status - Plan: cyclobenzaprine (FLEXERIL) 5 MG tablet, meloxicam (MOBIC) 7.5 MG tablet  Abdominal pain, chronic, epigastric - Plan: esomeprazole (NEXIUM) 40 MG capsule  Allergic rhinitis due to other allergic trigger, unspecified seasonality - Plan: fluticasone (FLONASE) 50 MCG/ACT nasal spray  Bronchospasm - Plan: albuterol (PROVENTIL HFA;VENTOLIN HFA) 108 (90 Base) MCG/ACT inhaler  Lumbar pain  Pain of both hip joints   Chronic medications refilled.  No imaging studies were done as the patient has had this chronic pain and has seen a specialist and she should call and make an appt with them for further f/u.  We will try muscle relaxers  that she has not been in the past to see if that will help.  We talked about PT and how helpful that can be.  She will f/u for her routine care with her PCP.  Windell Hummingbird PA-C  [facility] 11/27/2016 8:07 AM

## 2016-11-24 NOTE — Patient Instructions (Addendum)
  Please make an appointment with Deliah BostonMichael Clark, PA on Saturday, July 14th, 2018.    IF you received an x-ray today, you will receive an invoice [facility]. Please contact [facility] at [phone] with questions or concerns regarding your invoice.   IF you received labwork today, you will receive an invoice from [facility]. Please contact [facility] at [phone] with questions or concerns regarding your invoice.   Our billing staff will not be able to assist you with questions regarding bills from these companies.  You will be contacted with the lab results as soon as they are available. The fastest way to get your results is to activate your My Chart account. Instructions are located on the last page of this paperwork. If you have not heard from us regarding the results in 2 weeks, please contact this office.

## 2016-12-28 ENCOUNTER — Encounter: Payer: Self-pay | Admitting: Podiatry

## 2016-12-28 ENCOUNTER — Ambulatory Visit (INDEPENDENT_AMBULATORY_CARE_PROVIDER_SITE_OTHER): Payer: BLUE CROSS/BLUE SHIELD

## 2016-12-28 ENCOUNTER — Ambulatory Visit (INDEPENDENT_AMBULATORY_CARE_PROVIDER_SITE_OTHER): Payer: BLUE CROSS/BLUE SHIELD | Admitting: Podiatry

## 2016-12-28 VITALS — BP 109/71 | HR 90 | Resp 16 | Ht 65.0 in | Wt 160.0 lb

## 2016-12-28 DIAGNOSIS — M25572 Pain in left ankle and joints of left foot: Secondary | ICD-10-CM

## 2016-12-28 DIAGNOSIS — E1140 Type 2 diabetes mellitus with diabetic neuropathy, unspecified: Secondary | ICD-10-CM

## 2016-12-28 DIAGNOSIS — M7750 Other enthesopathy of unspecified foot: Secondary | ICD-10-CM

## 2016-12-28 DIAGNOSIS — M25571 Pain in right ankle and joints of right foot: Secondary | ICD-10-CM

## 2016-12-28 DIAGNOSIS — M779 Enthesopathy, unspecified: Secondary | ICD-10-CM | POA: Diagnosis not present

## 2016-12-28 DIAGNOSIS — G8929 Other chronic pain: Secondary | ICD-10-CM

## 2016-12-28 DIAGNOSIS — M778 Other enthesopathies, not elsewhere classified: Secondary | ICD-10-CM

## 2016-12-28 DIAGNOSIS — M79672 Pain in left foot: Secondary | ICD-10-CM | POA: Diagnosis not present

## 2016-12-28 DIAGNOSIS — E1149 Type 2 diabetes mellitus with other diabetic neurological complication: Secondary | ICD-10-CM

## 2016-12-28 MED ORDER — TRIAMCINOLONE ACETONIDE 10 MG/ML IJ SUSP
10.0000 mg | Freq: Once | INTRAMUSCULAR | Status: AC
  Administered 2016-12-28: 10 mg

## 2016-12-28 NOTE — Progress Notes (Signed)
   Subjective:    Patient ID: Kristie ReeveLora Mednick, female    DOB: 11-25-1970, 46 y.o.   MRN: [id number]  HPI Chief Complaint  Patient presents with  . Foot Pain    Left foot; plantar forefoot; pt stated, "foot is still hurting from an injury to the foot on 01/06/2014; feels like walking on rocks on the ball of the foot; big toe feels numb"  . Ankle Pain    Bilateral;  both sides; pt stated, "sometimes feels like ankle is coming out of the joint"; Pt Diabetic Type 2; Sugar=Did not check today; A1C=9.4  . Nail Problem    Right foot; great toe; nail discoloration & thickened nail; pt stated, "Has taken Lamisil for this but is not helping"     Review of Systems  Constitutional: Positive for fatigue.  HENT: Positive for hearing loss and tinnitus.   Eyes: Positive for visual disturbance.  Respiratory: Positive for chest tightness.   Gastrointestinal: Positive for abdominal pain, diarrhea and nausea.  Endocrine: Positive for cold intolerance, heat intolerance and polydipsia.  Genitourinary: Positive for urgency.  Musculoskeletal: Positive for arthralgias, back pain, gait problem and myalgias.  Neurological: Positive for dizziness, tremors, weakness, numbness and headaches.  All other systems reviewed and are negative.      Objective:   Physical Exam        Assessment & Plan:

## 2016-12-30 NOTE — Progress Notes (Signed)
Subjective:    Patient ID: Kristie ReeveLora Tapia, female   DOB: 46 y.o.   MRN: [id number]   HPI patient presents stating that her ankles bother her quite a bit on both feet and that she's had chronic pain in both feet and had an accident 2015 creating structural changes to the left foot    Review of Systems  All other systems reviewed and are negative.       Objective:  Physical Exam  Constitutional: She is oriented to person, place, and time.  Cardiovascular: Intact distal pulses.   Musculoskeletal: Normal range of motion.  Neurological: She is alert and oriented to person, place, and time.  Skin: Skin is warm and dry.  Nursing note and vitals reviewed.  neurovascular status was found to be intact with muscle strength adequate range of motion within normal limits. Patient did have some reduction of motion within the subtalar joint bilateral but it appears to be strictly due to pain of the subtalar joint. Subtalar joints bilateral are painful moderate forefoot pain is noted with keratotic lesion with no other significant pathology noted     Assessment:  Appears to be inflammatory with also part of this possibly related to diabetes with neuropathic changes. The pain is centralized to the sinus tarsi bilateral with moderate forefoot discomfort       Plan:    H&P x-rays reviewed and sinus tarsi injections bilateral administered 3 mg Kenalog 5 mg Xylocaine. I instructed on physical therapy I instructed on trying to improve her diabetic care and trying to reduce her A1c. Patient will watch her sugar more carefully for the next several days and will be seen back in approximately a month to see how she responded to medication  X-rays indicate that there is moderate high arch foot type with no indications of advanced arthritis or stress fracture

## 2017-01-07 ENCOUNTER — Ambulatory Visit: Payer: BLUE CROSS/BLUE SHIELD | Admitting: Orthotics

## 2017-01-07 DIAGNOSIS — M25572 Pain in left ankle and joints of left foot: Secondary | ICD-10-CM

## 2017-01-07 DIAGNOSIS — M778 Other enthesopathies, not elsewhere classified: Secondary | ICD-10-CM

## 2017-01-07 DIAGNOSIS — M779 Enthesopathy, unspecified: Principal | ICD-10-CM

## 2017-01-07 DIAGNOSIS — G8929 Other chronic pain: Secondary | ICD-10-CM

## 2017-01-07 DIAGNOSIS — M25571 Pain in right ankle and joints of right foot: Secondary | ICD-10-CM

## 2017-01-07 NOTE — Progress Notes (Signed)
Patient presents today for diabetic shoe measurement and foam casting.  Goals of diabetic shoes/inserts to offer protection from conditions secondary to DM2, offer relief from sheer forces that could lead to ulcerations, protect the foot, and offer greater stability. Patient is under supervision of DPM: Regal Physician managing patients DM2: Michale clarkPatient has following documented conditions to qualify for diabetic shoes/inserts: apex [id number]   34M Patient measured with brannock device:  Financial responsibility:  There is concern that if insuranc doesn't cover these items, patient cant afford them as she has stated that explicitedly.

## 2017-01-25 ENCOUNTER — Ambulatory Visit: Payer: BLUE CROSS/BLUE SHIELD | Admitting: Podiatry

## 2017-02-02 ENCOUNTER — Encounter: Payer: Self-pay | Admitting: Physician Assistant

## 2017-02-02 ENCOUNTER — Ambulatory Visit (INDEPENDENT_AMBULATORY_CARE_PROVIDER_SITE_OTHER): Payer: BLUE CROSS/BLUE SHIELD | Admitting: Physician Assistant

## 2017-02-02 VITALS — BP 108/71 | HR 94 | Temp 98.7°F | Resp 17 | Ht 66.0 in | Wt 164.0 lb

## 2017-02-02 DIAGNOSIS — Z794 Long term (current) use of insulin: Secondary | ICD-10-CM | POA: Diagnosis not present

## 2017-02-02 DIAGNOSIS — E1142 Type 2 diabetes mellitus with diabetic polyneuropathy: Secondary | ICD-10-CM

## 2017-02-02 DIAGNOSIS — Z9189 Other specified personal risk factors, not elsewhere classified: Secondary | ICD-10-CM

## 2017-02-02 LAB — POCT GLYCOSYLATED HEMOGLOBIN (HGB A1C)

## 2017-02-02 MED ORDER — INSULIN ASPART 100 UNIT/ML ~~LOC~~ SOLN: 10.0000 [IU] | Freq: Three times a day (TID) | SUBCUTANEOUS | 3 refills | Status: DC

## 2017-02-02 MED ORDER — INSULIN GLARGINE 100 UNIT/ML SOLOSTAR PEN: 30.0000 [IU] | PEN_INJECTOR | Freq: Every day | SUBCUTANEOUS | 99 refills | Status: DC

## 2017-02-02 MED ORDER — GABAPENTIN 300 MG PO CAPS: 600.0000 mg | ORAL_CAPSULE | Freq: Three times a day (TID) | ORAL | 1 refills | Status: DC

## 2017-02-02 MED ORDER — ASPIRIN EC 81 MG PO TBEC: 81.0000 mg | DELAYED_RELEASE_TABLET | Freq: Every day | ORAL | 0 refills | Status: DC

## 2017-02-02 NOTE — Progress Notes (Signed)
02/02/2017 1:07 PM   DOB: Aug 12, 1970 / MRN: [id number]  SUBJECTIVE:  Kristie Tapia is a 46 y.o. female presenting for uncontrolled diabetes with polyneuropathy. She has been medication non compliant in the past and does not take metformin due to diarrhea.  She is a smoker and this has not changed today. Continues pack a day smoking.   Sees podiatry for ankle pain.   She is allergic to adhesive [tape]; sulfa antibiotics; decongestant [pseudoephedrine hcl er]; prednisone; percocet [oxycodone-acetaminophen]; zoloft [sertraline hcl]; and latex.   She  has a past medical history of Anemia; Anxiety; Complication of anesthesia (` 1998); Gallstones; GERD (gastroesophageal reflux disease); H/O hiatal hernia; History of stomach ulcers; Migraines; Sleep apnea; and Type II diabetes mellitus (HCC).    She  reports that she has been smoking Cigarettes.  She has a 29.00 pack-year smoking history. She has never used smokeless tobacco. She reports that she drinks alcohol. She reports that she does not use drugs. She  reports that she currently engages in sexual activity. The patient  has a past surgical history that includes Appendectomy (01/03/2012); Abdominal hysterectomy (08/1998); Dilation and curettage of uterus; Abdominal exploration surgery; Tubal ligation (05/1998); LEEP (1998); and laparoscopic appendectomy (01/03/2012).  Her family history includes Breast cancer in her unknown relative.  Review of Systems  Constitutional: Negative for chills, diaphoresis and fever.  Respiratory: Negative for cough, hemoptysis, sputum production, shortness of breath and wheezing.   Cardiovascular: Negative for chest pain, orthopnea and leg swelling.  Gastrointestinal: Negative for abdominal pain, blood in stool, constipation, diarrhea, heartburn, melena, nausea and vomiting.  Genitourinary: Negative for flank pain.  Skin: Negative for rash.  Neurological: Negative for dizziness.    The problem list and medications  were reviewed and updated by myself where necessary and exist elsewhere in the encounter.   OBJECTIVE:  BP 108/71   Pulse 94   Temp 98.7 F (37.1 C) (Oral)   Resp 17   Ht 5\' 6"  (1.676 m)   Wt 164 lb (74.4 kg)   SpO2 98%   BMI 26.47 kg/m   Wt Readings from Last 3 Encounters:  02/02/17 164 lb (74.4 kg)  12/28/16 160 lb (72.6 kg)  11/24/16 165 lb 12.8 oz (75.2 kg)   Physical Exam  Constitutional: She is oriented to person, place, and time. She is active.  Non-toxic appearance.  Eyes: Pupils are equal, round, and reactive to light. EOM are normal.  Cardiovascular: Normal rate, regular rhythm, S1 normal, S2 normal, normal heart sounds and intact distal pulses.  Exam reveals no gallop, no friction rub and no decreased pulses.   No murmur heard. Pulmonary/Chest: Effort normal. No stridor. No tachypnea. No respiratory distress. She has no wheezes. She has no rales.  Abdominal: She exhibits no distension.  Musculoskeletal: She exhibits no edema.  Neurological: She is alert and oriented to person, place, and time. She has normal strength and normal reflexes. She is not disoriented. She displays no atrophy. No cranial nerve deficit or sensory deficit. She exhibits normal muscle tone. Coordination and gait normal.  Skin: Skin is warm and dry. She is not diaphoretic. No pallor.  Psychiatric: Her behavior is normal.    Lab Results  Component Value Date   NA 138 06/09/2016   K 5.0 06/09/2016   CL 96 06/09/2016   CO2 24 06/09/2016    Lab Results  Component Value Date   CREATININE 0.83 06/09/2016    Lab Results  Component Value Date   ALT 15  02/02/2016   AST 12 02/02/2016   ALKPHOS 82 02/02/2016   BILITOT 0.6 02/02/2016    Lab Results  Component Value Date   TSH 1.30 08/29/2015    Lab Results  Component Value Date   HGBA1C >14.0 02/02/2017    Lab Results  Component Value Date   CHOL 194 06/09/2016   HDL 30 (L) 06/09/2016   LDLCALC 108 (H) 06/09/2016   TRIG 281 (H)  06/09/2016   CHOLHDL 6.5 (H) 06/09/2016    No results found.  ASSESSMENT AND PLAN:  [name] was seen today for diabetes and medication refill.  Diagnoses and all orders for this visit:  Type 2 diabetes mellitus with diabetic polyneuropathy, with long-term current use of insulin [facility](HCC): She did not like her previous endocrinologist.  Advised we try another.  Will refer to Dr. Everardo AllEllison. Increase lantus to 30 mg and novolog to 10-15 untis with meals. She will not take metformin.  -     POCT glycosylated hemoglobin (Hb A1C) -     Microalbumin, urine -     HM Diabetes Foot Exam -     Lipid panel -     gabapentin (NEURONTIN) 300 MG capsule; Take 2 capsules (600 mg total) by mouth 3 (three) times daily. Stat with one tab daily for three days, then add one pill every three days until up two 600 mg three times daily. -     insulin aspart (NOVOLOG) 100 UNIT/ML injection; Inject 10-15 Units into the skin 3 (three) times daily before meals. -     Insulin Glargine (LANTUS SOLOSTAR) 100 UNIT/ML Solostar Pen; Inject 30 Units into the skin daily at 10 pm. -     C-peptide -     Ambulatory referral to Endocrinology  At risk for acute ischemic cardiac event: She needs to quit smoking.  I have counseled her on this before.  Hopefully cards can help convince her to stop. She is taking crestor qhs and I am add ASA tday.  -     aspirin EC 81 MG tablet; Take 1 tablet (81 mg total) by mouth daily. -     EKG 12-Lead -     Ambulatory referral to Cardiology    The patient is advised to call or return to clinic if she does not see an improvement in symptoms, or to seek the care of the closest emergency department if she worsens with the above plan.   Deliah BostonMichael Clark, MHS, PA-C [facility] at [facility] 02/02/2017 1:07 PM

## 2017-02-02 NOTE — Patient Instructions (Signed)
     IF you received an x-ray today, you will receive an invoice from [facility]. Please contact [facility] at [phone] with questions or concerns regarding your invoice.   IF you received labwork today, you will receive an invoice from [facility]. Please contact [facility] at [phone] with questions or concerns regarding your invoice.   Our billing staff will not be able to assist you with questions regarding bills from these companies.  You will be contacted with the lab results as soon as they are available. The fastest way to get your results is to activate your My Chart account. Instructions are located on the last page of this paperwork. If you have not heard from us regarding the results in 2 weeks, please contact this office.     

## 2017-02-03 LAB — MICROALBUMIN, URINE

## 2017-02-05 ENCOUNTER — Telehealth: Payer: Self-pay | Admitting: Physician Assistant

## 2017-02-05 LAB — LIPID PANEL
Chol/HDL Ratio: 3.1 ratio (ref 0.0–4.4)
Cholesterol, Total: 110 mg/dL (ref 100–199)
HDL: 35 mg/dL — AB (ref >39)
LDL Calculated: 22 mg/dL (ref 0–99)
Triglycerides: 265 mg/dL — ABNORMAL HIGH (ref 0–149)
VLDL Cholesterol Cal: 53 mg/dL — ABNORMAL HIGH (ref 5–40)

## 2017-02-05 LAB — C-PEPTIDE: C PEPTIDE: 5.9 ng/mL — AB (ref 1.1–4.4)

## 2017-02-05 NOTE — Telephone Encounter (Signed)
PATIENT STATES SHE SAW MICHAEL THIS PAST Saturday (02/02/17) AND SHE TOLD HIM SHE WAS GOING TO APPLY FOR DISABILITY. HE TOLD HER WHEN SHE GOT THE FORMS TO BRING THEM IN AND HE WOULD FILL THEM OUT. IS THAT ALL SHE HAS TO DO OR DOES SHE NEED TO MAKE ANOTHER APPOINTMENT TO SEE HIM? BEST PHONE [phone] (CELL) MBC

## 2017-02-06 NOTE — Telephone Encounter (Signed)
Please advise 

## 2017-02-07 ENCOUNTER — Telehealth: Payer: Self-pay | Admitting: Physician Assistant

## 2017-02-07 NOTE — Telephone Encounter (Signed)
Nurse with [facility] and [employer] is calling stating that the pt did pick up the diabetic supplies but the pharmacy gave her vials instead of the pen.  Nurse states that the pt does not have any needles and is in need of a script for those.  Currently she is using incorrect needles to draw and inject her insulin.  Please advise  [phone]

## 2017-02-12 NOTE — Telephone Encounter (Signed)
[facility] gave [facility] order to fill syringes, #12 refills for Novolog vials

## 2017-02-12 NOTE — Telephone Encounter (Signed)
Left message on [facility] [employer] nurse Amy vm.

## 2017-02-14 ENCOUNTER — Telehealth: Payer: Self-pay | Admitting: Neurology

## 2017-02-14 NOTE — Telephone Encounter (Signed)
Pt called and said she wants Dr Everlena CooperJaffe to refer her for an MRI, she said she has not had one in 5 years

## 2017-02-14 NOTE — Telephone Encounter (Signed)
Pt wishes to have MRI. Was seen in May, was to f/u in Aug, changed appt to Jan. Do you want to order this befire Pt is seen?

## 2017-02-15 ENCOUNTER — Ambulatory Visit: Payer: BLUE CROSS/BLUE SHIELD | Admitting: Neurology

## 2017-02-15 NOTE — Telephone Encounter (Signed)
I can't order an MRI unless we discuss the reason why.  So, I would need to discuss it with her in the office first.  She should put herself on the wait list to get a sooner appointment, which often happens.

## 2017-02-15 NOTE — Telephone Encounter (Signed)
Spoke with Pt, advsd her will need to be seen, will add to wail list

## 2017-02-22 ENCOUNTER — Encounter: Payer: Self-pay | Admitting: Endocrinology

## 2017-02-22 ENCOUNTER — Ambulatory Visit (INDEPENDENT_AMBULATORY_CARE_PROVIDER_SITE_OTHER): Payer: BLUE CROSS/BLUE SHIELD | Admitting: Endocrinology

## 2017-02-22 DIAGNOSIS — E785 Hyperlipidemia, unspecified: Secondary | ICD-10-CM

## 2017-02-22 DIAGNOSIS — R519 Headache, unspecified: Secondary | ICD-10-CM

## 2017-02-22 DIAGNOSIS — Z794 Long term (current) use of insulin: Secondary | ICD-10-CM | POA: Diagnosis not present

## 2017-02-22 DIAGNOSIS — F419 Anxiety disorder, unspecified: Secondary | ICD-10-CM | POA: Diagnosis not present

## 2017-02-22 DIAGNOSIS — R51 Headache: Secondary | ICD-10-CM | POA: Diagnosis not present

## 2017-02-22 DIAGNOSIS — E1142 Type 2 diabetes mellitus with diabetic polyneuropathy: Secondary | ICD-10-CM | POA: Diagnosis not present

## 2017-02-22 MED ORDER — INSULIN DEGLUDEC 100 UNIT/ML ~~LOC~~ SOPN: 45.0000 [IU] | PEN_INJECTOR | Freq: Every day | SUBCUTANEOUS | 11 refills | Status: DC

## 2017-02-22 NOTE — Patient Instructions (Addendum)
good diet and exercise significantly improve the control of your diabetes.  please let me know if you wish to be referred to a dietician.  high blood sugar is very risky to your health.  you should see an eye doctor and dentist every year.  It is very important to get all recommended vaccinations.  Controlling your blood pressure and cholesterol drastically reduces the damage diabetes does to your body.  Those who smoke should quit.  Please discuss these with your doctor.  check your blood sugar twice a day.  vary the time of day when you check, between before the 3 meals, and at bedtime.  also check if you have symptoms of your blood sugar being too high or too low.  please keep a record of the readings and bring it to your next appointment here (or you can bring the meter itself).  You can write it on any piece of paper.  please call us sooner if your blood sugar goes below 70, or if you have a lot of readings over 200. I have sent a prescription to your pharmacy, to change both current insulins to "tresiba," 45 units daily.   This is probably not enough, so please call or message [place] next week, to tell us how the blood sugar is doing.  Please come back for a follow-up appointment in 2 months.

## 2017-02-22 NOTE — Progress Notes (Signed)
Subjective:    Patient ID: Kristie Tapia, female    DOB: Mar 16, 1971, 46 y.o.   MRN: [id number]  HPI pt is referred by Philis Fendt, PA, for diabetes.  Pt states DM was dx'ed in 1998; she has moderate neuropathy of the lower extremities, and assoc pain; she is unaware of any associated chronic complications; she has been on insulin since 2001; pt says her diet and exercise are not good; he has never had GDM, pancreatitis, pancreatic surgery, severe hypoglycemia or DKA.  She takes multiple daily injections.  She takes lantus 30/d, and PRN novolog (averages a total of approx 17 units per day), but she takes insulins inconsistently.   Past Medical History:  Diagnosis Date  . Anemia   . Anxiety   . Complication of anesthesia ` 1998   "lungs collapsed during LEEP procedure"  . Gallstones   . GERD (gastroesophageal reflux disease)   . H/O hiatal hernia   . History of stomach ulcers   . Migraines   . Sleep apnea   . Type II diabetes mellitus ([place])     Past Surgical History:  Procedure Laterality Date  . ABDOMINAL EXPLORATION SURGERY     "I've had several"  . ABDOMINAL HYSTERECTOMY  08/1998  . APPENDECTOMY  01/03/2012  . DILATION AND CURETTAGE OF UTERUS    . LAPAROSCOPIC APPENDECTOMY  01/03/2012   Procedure: APPENDECTOMY LAPAROSCOPIC;  Surgeon: Edward Jolly, MD;  Location: [place];  Service: General;  Laterality: N/A;  . LEEP  1998   "lungs collapsed"  . TUBAL LIGATION  05/1998    Social History   Social History  . Marital status: Single    Spouse name: N/A  . Number of children: N/A  . Years of education: N/A   Occupational History  . Not on file.   Social History Main Topics  . Smoking status: Current Every Day Smoker    Packs/day: 1.00    Years: 29.00    Types: Cigarettes  . Smokeless tobacco: Never Used  . Alcohol use Yes     Comment: 01/03/2012 "6pk would last me a year"  . Drug use: No  . Sexual activity: Yes   Other Topics Concern  . Not on file   Social  History Narrative  . No narrative on file    Current Outpatient Prescriptions on File Prior to Visit  Medication Sig Dispense Refill  . albuterol (PROVENTIL HFA;VENTOLIN HFA) 108 (90 Base) MCG/ACT inhaler Inhale 2 puffs into the lungs every 6 (six) hours as needed for wheezing or shortness of breath. 1 Inhaler 0  . blood glucose meter kit and supplies Dispense based on patient and insurance preference. Four times daily. Dx: E11.9 1 each 0  . escitalopram (LEXAPRO) 20 MG tablet TAKE ONE TABLET BY MOUTH ONCE DAILY 90 tablet 1  . esomeprazole (NEXIUM) 40 MG capsule Take 1 capsule (40 mg total) by mouth daily. 90 capsule 0  . gabapentin (NEURONTIN) 300 MG capsule Take 2 capsules (600 mg total) by mouth 3 (three) times daily. Stat with one tab daily for three days, then add one pill every three days until up two 600 mg three times daily. 268 capsule 1  . naproxen (NAPROSYN) 500 MG tablet Take 1 tablet with sumatriptan.  May repeat once with sumatriptan if needed. 16 tablet 2  . nortriptyline (PAMELOR) 50 MG capsule Take 1 capsule (50 mg total) by mouth at bedtime. (Patient taking differently: Take 25 mg by mouth at bedtime. ) 30 capsule  2  . rosuvastatin (CRESTOR) 40 MG tablet Take 1 tablet (40 mg total) by mouth daily. 90 tablet 3  . SUMAtriptan (IMITREX) 100 MG tablet TAKE ONE TABLET BY MOUTH EVERY 2 HOURS AS NEEDED FOR  MIGRAINE.  MAY  REPEAT  IN  2  HOURS  IF  HEADACHE  PERSISTS  OR  RECURS 30 tablet 0  . traZODone (DESYREL) 100 MG tablet TAKE ONE TABLET BY MOUTH AT BEDTIME 30 tablet 11  . aspirin EC 81 MG tablet Take 1 tablet (81 mg total) by mouth daily. (Patient not taking: Reported on 02/22/2017) 365 tablet 0   No current facility-administered medications on file prior to visit.     Allergies  Allergen Reactions  . Adhesive [Tape] Other (See Comments)    Blisters USE PAPER TAPE ONLY  . Sulfa Antibiotics Other (See Comments)    Burns the inside of my mouth; "blisters; leaves tongue and  inside of mouth solid red"  . Decongestant [Pseudoephedrine Hcl Er] Itching    ALL DECONGESTANTS  . Prednisone Other (See Comments)    Gives me a bad attitude  . Percocet [Oxycodone-Acetaminophen] Other (See Comments)    PT reports having nightmares when taking Percocet  . Zoloft [Sertraline Hcl]   . Latex Itching    Family History  Problem Relation Age of Onset  . Breast cancer Unknown   . Diabetes Paternal Grandfather     BP 126/78   Pulse 90   SpO2 96%    Review of Systems denies weight loss, chest pain, sob, n/v, urinary frequency, muscle cramps, excessive diaphoresis, depression, cold intolerance, rhinorrhea, and easy bruising.  She has chronic generalized body pain.  She has blurry vision, and headache.      Objective:   Physical Exam VS: see vs page GEN: no distress HEAD: head: no deformity eyes: no periorbital swelling, no proptosis external nose and ears are normal mouth: no lesion seen NECK: supple, thyroid is not enlarged CHEST WALL: no deformity LUNGS: clear to auscultation CV: reg rate and rhythm, no murmur ABD: abdomen is soft, nontender.  no hepatosplenomegaly.  not distended.  no hernia MUSCULOSKELETAL: muscle bulk and strength are grossly normal.  no obvious joint swelling.  gait is normal and steady EXTEMITIES: no deformity.  no ulcer on the feet.  feet are of normal color and temp.  no edema PULSES: dorsalis pedis intact bilat.  no carotid bruit NEURO:  cn 2-12 grossly intact.   readily moves all 4's.  sensation is intact to touch on the feet.  SKIN:  Normal texture and temperature.  No rash or suspicious lesion is visible.  Exquisite sensitivity to touch on the neck, abd, legs, and feet.    NODES:  None palpable at the neck PSYCH: alert, well-oriented.  Does not appear anxious nor depressed.    Lab Results  Component Value Date   HGBA1C >14.0 02/02/2017   I have reviewed outside records, and summarized: She was noted to have severely elevated  a1c, and ref here. therapy was noted to be limited by noncompliance.  She was noted to be a smoker.   Lab Results  Component Value Date   CREATININE 0.83 06/09/2016   BUN 10 06/09/2016   NA 138 06/09/2016   K 5.0 06/09/2016   CL 96 06/09/2016   CO2 24 06/09/2016   I personally reviewed electrocardiogram tracing (12/8/5): Indication:  Impression: NSR.  No MI.  NS-STA Compared to: no significant change     Assessment & Plan:  Insulin-requiring type 2 DM, with polyneuropathy: severe exacerbation.  Noncompliance with insulin, new to me.  She needs an insulin she can take later in the day, if she misses in am.  She is not a candidate for multiple daily injections.   Patient Instructions  good diet and exercise significantly improve the control of your diabetes.  please let me know if you wish to be referred to a dietician.  high blood sugar is very risky to your health.  you should see an eye doctor and dentist every year.  It is very important to get all recommended vaccinations.  Controlling your blood pressure and cholesterol drastically reduces the damage diabetes does to your body.  Those who smoke should quit.  Please discuss these with your doctor.  check your blood sugar twice a day.  vary the time of day when you check, between before the 3 meals, and at bedtime.  also check if you have symptoms of your blood sugar being too high or too low.  please keep a record of the readings and bring it to your next appointment here (or you can bring the meter itself).  You can write it on any piece of paper.  please call us sooner if your blood sugar goes below 70, or if you have a lot of readings over 200. I have sent a prescription to your pharmacy, to change both current insulins to "tresiba," 45 units daily.   This is probably not enough, so please call or message [place] next week, to tell us how the blood sugar is doing.  Please come back for a follow-up appointment in 2 months.

## 2017-02-24 DIAGNOSIS — F419 Anxiety disorder, unspecified: Secondary | ICD-10-CM | POA: Insufficient documentation

## 2017-02-24 DIAGNOSIS — E785 Hyperlipidemia, unspecified: Secondary | ICD-10-CM | POA: Insufficient documentation

## 2017-02-24 DIAGNOSIS — R519 Headache, unspecified: Secondary | ICD-10-CM | POA: Insufficient documentation

## 2017-02-24 DIAGNOSIS — E119 Type 2 diabetes mellitus without complications: Secondary | ICD-10-CM | POA: Insufficient documentation

## 2017-02-24 DIAGNOSIS — R51 Headache: Secondary | ICD-10-CM

## 2017-02-25 ENCOUNTER — Other Ambulatory Visit: Payer: Self-pay | Admitting: Physician Assistant

## 2017-02-25 NOTE — Telephone Encounter (Signed)
Please advise 

## 2017-02-28 ENCOUNTER — Encounter: Payer: Self-pay | Admitting: Neurology

## 2017-02-28 ENCOUNTER — Other Ambulatory Visit: Payer: Self-pay | Admitting: Physician Assistant

## 2017-02-28 ENCOUNTER — Ambulatory Visit: Payer: Self-pay | Admitting: Podiatry

## 2017-02-28 ENCOUNTER — Telehealth: Payer: Self-pay

## 2017-02-28 ENCOUNTER — Telehealth: Payer: Self-pay | Admitting: Endocrinology

## 2017-02-28 DIAGNOSIS — G8929 Other chronic pain: Secondary | ICD-10-CM

## 2017-02-28 DIAGNOSIS — R1013 Epigastric pain: Principal | ICD-10-CM

## 2017-02-28 NOTE — Telephone Encounter (Signed)
Kristie Tapia, I received a request for a PA on Trazodone.  I do not see any codes connected to the medication, except it says to take at bedtime.  Is patient using it for sleep or anxiety or both? Please advise... Thanks, [employer]

## 2017-02-28 NOTE — Telephone Encounter (Signed)
Patient wanted to call in to give her blood sugar readings for the last few days. She would like to know what she needs to change her dosage to. Please advise.     02/24/17 261 6:15 pm 02/25/17 5:30am-219 8 pm- 247 02/26/17 9 am- 210 7:45pm 216 02/27/17 6:35am 235 6:15 pm 264

## 2017-02-28 NOTE — Telephone Encounter (Signed)
please call patient: Please increase insulin to 55 units qd. Please call or message [place] next week, to tell us how the blood sugar is doing

## 2017-03-01 NOTE — Telephone Encounter (Signed)
Patient notified & said she would call next week with cbg's.

## 2017-03-01 NOTE — Telephone Encounter (Signed)
Sleep and anxiety.

## 2017-03-01 NOTE — Telephone Encounter (Signed)
Filed request with cover my meds today.  Should be approved or denied within 3 business days.  Key is [id number].

## 2017-03-03 ENCOUNTER — Encounter: Payer: Self-pay | Admitting: Neurology

## 2017-03-05 NOTE — Telephone Encounter (Signed)
Per Cover My Meds, no PA is required.  Pharmacy aware.  They should notify patient.

## 2017-03-07 ENCOUNTER — Telehealth: Payer: Self-pay | Admitting: Endocrinology

## 2017-03-07 NOTE — Telephone Encounter (Signed)
Patient calling to give blood sugar readings. Call patient to advise.

## 2017-03-07 NOTE — Telephone Encounter (Signed)
CBG's  AM:  Lunch:  PM: 9/30 327   346 10/1 231 186  257 10/2 205  10/3 308 10/4 202  Patient also took 12 units of R insulin on 10/1 to get blood sugar down that's when at 11:30am it went down to 186.

## 2017-03-07 NOTE — Telephone Encounter (Signed)
Please increase tresiba to 65 units daily. Please call or message us [place] week, to tell us how the blood sugar is doing

## 2017-03-08 ENCOUNTER — Ambulatory Visit (INDEPENDENT_AMBULATORY_CARE_PROVIDER_SITE_OTHER): Payer: BLUE CROSS/BLUE SHIELD | Admitting: Podiatry

## 2017-03-08 ENCOUNTER — Encounter: Payer: Self-pay | Admitting: Podiatry

## 2017-03-08 DIAGNOSIS — E1149 Type 2 diabetes mellitus with other diabetic neurological complication: Secondary | ICD-10-CM | POA: Diagnosis not present

## 2017-03-08 DIAGNOSIS — M778 Other enthesopathies, not elsewhere classified: Secondary | ICD-10-CM

## 2017-03-08 DIAGNOSIS — E1140 Type 2 diabetes mellitus with diabetic neuropathy, unspecified: Secondary | ICD-10-CM

## 2017-03-08 DIAGNOSIS — M779 Enthesopathy, unspecified: Principal | ICD-10-CM

## 2017-03-08 DIAGNOSIS — M7750 Other enthesopathy of unspecified foot: Secondary | ICD-10-CM

## 2017-03-08 DIAGNOSIS — L600 Ingrowing nail: Secondary | ICD-10-CM | POA: Diagnosis not present

## 2017-03-08 NOTE — Telephone Encounter (Signed)
Notified patient & she will be messaging back with CBG"s.

## 2017-03-12 ENCOUNTER — Ambulatory Visit (INDEPENDENT_AMBULATORY_CARE_PROVIDER_SITE_OTHER): Payer: BLUE CROSS/BLUE SHIELD | Admitting: Neurology

## 2017-03-12 ENCOUNTER — Encounter: Payer: Self-pay | Admitting: Neurology

## 2017-03-12 VITALS — BP 104/68 | HR 70 | Ht 65.0 in | Wt 177.0 lb

## 2017-03-12 DIAGNOSIS — R9089 Other abnormal findings on diagnostic imaging of central nervous system: Secondary | ICD-10-CM

## 2017-03-12 DIAGNOSIS — G43001 Migraine without aura, not intractable, with status migrainosus: Secondary | ICD-10-CM

## 2017-03-12 MED ORDER — ZOLMITRIPTAN 5 MG NA SOLN: 5.0000 mg | NASAL | 0 refills | Status: DC | PRN

## 2017-03-12 MED ORDER — ERENUMAB-AOOE 70 MG/ML ~~LOC~~ SOAJ: 70.0000 mg | SUBCUTANEOUS | 11 refills | Status: DC

## 2017-03-12 NOTE — Patient Instructions (Signed)
1.  Stop nortriptyline.  Instead, we will try the new migraine medication, Aimovig, one shot a month. 2.  Stop sumatriptan.  Instead, try the Zomig nasal spray, 1 spray at earliest onset of migraine and may repeat once in 2 hours as needed. 3.  Repeat MRI of brain with and without contrast.  Bring copy of the report to the MRI and give it to the tech so they can give it to the radiologist for comparison. 4.  Follow up in 3 months.

## 2017-03-12 NOTE — Progress Notes (Signed)
NEUROLOGY FOLLOW UP OFFICE NOTE  Kristie Tapia [id number]  HISTORY OF PRESENT ILLNESS: Kristie Tapia is a 46 year old female with type 2 diabetes, fibromyalgia, and anxiety who follows up for migraine.  UPDATE: Nortriptyline 72m made her feel funny, so she reduced dose back to 234mat bedtime.  Sumatriptan tablets with naproxen ineffective. Intensity:  7-8/10 Duration:  All day Frequency:  3 to 4 days a week Frequency of abortive medication: almost daily Current NSAIDS:  naproxen (with sumatriptan) Current analgesics:  Excedrin Migraine and Tension Current triptans:  sumatriptan 10077mwith naproxen 500m69murrent anti-emetic:  promethazine 25mg4mrent muscle relaxants:  Skelaxin Current anti-anxiolytic:  no Current sleep aide:  trazodone Current Antihypertensive medications:  no Current Antidepressant medications:  Lexapro 20mg,10mtriptyline 25mg C86mnt Anticonvulsant medications:  Gabapentin 600mg tw58mdaily Current Vitamins/Herbal/Supplements:  Omega 3 Current Antihistamines/Decongestants:  Flonase Other therapy:  no   Caffeine:  Dr. Pepper, Malachi Bondstea Smoker:  yes Diet:  Dr. Pepper, Malachi Bondstea.  No water. Exercise:  no Depression/anxiety:  anxiety Sleep hygiene:  Poor  She had an MRI of the brain without contrast on 05/01/13 at an outside facility that demonstrated an ovoid area of hyperattenuation on T2 weighted images within the right thalamus measuring less than 5 mm in diameter, as well as punctate are of signal hyperintensity within the right superior temporal lobe at the gray-white junction and a punctate hyperintense focus and a linear juxtacortical lesion within the right frontal lobe concerning for demyelination.  Follow up MRI with contrast from 05/09/13 revealed no enhancement.  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  HISTORY:  Onset:  6th grade Location:  Bi-temporal/parietal and travels to occiput and back of neck Quality:  pressure Initial Intensity:  7-8/10 Aura:  Squiggly lines in vision of one or both eyes for 30 minutes prior to onset of headache and then resolves. Prodrome:  no Associated symptoms:  Nausea, photophobia, phonopohobia.  She has not had any new worse headache of her life, waking up from sleep Initial Duration:  All day (used to respond to sumatriptan 50mg in 34mur) Initial Frequency:  Varies.  Some months it may be daily, sometimes once a week Initial Frequency of abortive medication: 1 to 2 days a week Triggers/exacerbating factors:  stress Relieving factors:  sleep Activity:  aggravates   Past NSAIDS:  ibuprofen Past analgesics:  tramadol, oxycodone Past abortive triptans:  sumatriptan 100mg, Max58m5mg Past m72mle relaxants:  Robaxin, tizanidine Past anti-emetic:  Zofran Past antihypertensive medications:  no Past antidepressant medications:  no Past anticonvulsant medications:  topiramate (helped in conjunction with other medications that she can't remember) Past vitamins/Herbal/Supplements:  no Past antihistamines/decongestants:  no Other past therapies:  no   Family history of headache:  no   To assess right sided mandibular pain, an X-ray of the mandible was performed on 07/14/16 and revealed significant dental disease in the right mandibular molars.  PAST MEDICAL HISTORY: Past Medical History:  Diagnosis Date  . Anemia   . Anxiety   . Complication of anesthesia ` 1998   "lungs collapsed during LEEP procedure"  . Gallstones   . GERD (gastroesophageal reflux disease)   . H/O hiatal hernia   . History of stomach ulcers   . Migraines   .  Sleep apnea   . Type II diabetes mellitus (HCC)     MEDICATIONS: Current Outpatient Prescriptions on File Prior to Visit  Medication Sig  Dispense Refill  . albuterol (PROVENTIL HFA;VENTOLIN HFA) 108 (90 Base) MCG/ACT inhaler Inhale 2 puffs into the lungs every 6 (six) hours as needed for wheezing or shortness of breath. 1 Inhaler 0  . aspirin EC 81 MG tablet Take 1 tablet (81 mg total) by mouth daily. 365 tablet 0  . blood glucose meter kit and supplies Dispense based on patient and insurance preference. Four times daily. Dx: E11.9 1 each 0  . escitalopram (LEXAPRO) 20 MG tablet TAKE 1 TABLET BY MOUTH ONCE DAILY 90 tablet 1  . esomeprazole (NEXIUM) 40 MG capsule TAKE 1 CAPSULE BY MOUTH ONCE DAILY 90 capsule 0  . gabapentin (NEURONTIN) 300 MG capsule Take 2 capsules (600 mg total) by mouth 3 (three) times daily. Stat with one tab daily for three days, then add one pill every three days until up two 600 mg three times daily. 268 capsule 1  . insulin degludec (TRESIBA FLEXTOUCH) 100 UNIT/ML SOPN FlexTouch Pen Inject 0.45 mLs (45 Units total) into the skin daily. And pen needles 1/day 5 pen 11  . LORazepam (ATIVAN) 0.5 MG tablet Take 0.5 mg by mouth as needed for anxiety.    . metoprolol-hydrochlorothiazide (LOPRESSOR HCT) 100-25 MG tablet Take 1 tablet by mouth daily.    . naproxen (NAPROSYN) 500 MG tablet Take 1 tablet with sumatriptan.  May repeat once with sumatriptan if needed. 16 tablet 2  . nortriptyline (PAMELOR) 50 MG capsule Take 1 capsule (50 mg total) by mouth at bedtime. (Patient taking differently: Take 25 mg by mouth at bedtime. ) 30 capsule 2  . rosuvastatin (CRESTOR) 40 MG tablet Take 1 tablet (40 mg total) by mouth daily. 90 tablet 3  . SUMAtriptan (IMITREX) 100 MG tablet TAKE ONE TABLET BY MOUTH EVERY 2 HOURS AS NEEDED FOR  MIGRAINE.  MAY  REPEAT  IN  2  HOURS  IF  HEADACHE  PERSISTS  OR  RECURS 30 tablet 0  . traZODone (DESYREL) 100 MG tablet TAKE ONE TABLET BY MOUTH AT BEDTIME 30 tablet 11   No current facility-administered medications on file prior to visit.     ALLERGIES: Allergies  Allergen Reactions  .  Adhesive [Tape] Other (See Comments)    Blisters USE PAPER TAPE ONLY  . Sulfa Antibiotics Other (See Comments)    Burns the inside of my mouth; "blisters; leaves tongue and inside of mouth solid red"  . Decongestant [Pseudoephedrine Hcl Er] Itching    ALL DECONGESTANTS  . Prednisone Other (See Comments)    Gives me a bad attitude  . Percocet [Oxycodone-Acetaminophen] Other (See Comments)    PT reports having nightmares when taking Percocet  . Zoloft [Sertraline Hcl]   . Latex Itching    FAMILY HISTORY: Family History  Problem Relation Age of Onset  . Breast cancer Unknown   . Diabetes Paternal Grandfather     SOCIAL HISTORY: Social History   Social History  . Marital status: Single    Spouse name: N/A  . Number of children: N/A  . Years of education: N/A   Occupational History  . Not on file.   Social History Main Topics  . Smoking status: Former Smoker    Packs/day: 1.00    Years: 29.00    Types: Cigarettes    Quit date: 03/05/2017  . Smokeless tobacco: Never Used  .  Alcohol use No     Comment: hasn't  had s drink in over 5 yrs  . Drug use: No  . Sexual activity: Yes   Other Topics Concern  . Not on file   Social History Narrative  . No narrative on file    REVIEW OF SYSTEMS: Constitutional: No fevers, chills, or sweats, no generalized fatigue, change in appetite Eyes: No visual changes, double vision, eye pain Ear, nose and throat: No hearing loss, ear pain, nasal congestion, sore throat Cardiovascular: No chest pain, palpitations Respiratory:  No shortness of breath at rest or with exertion, wheezes GastrointestinaI: No nausea, vomiting, diarrhea, abdominal pain, fecal incontinence Genitourinary:  No dysuria, urinary retention or frequency Musculoskeletal:  No neck pain, back pain Integumentary: No rash, pruritus, skin lesions Neurological: as above Psychiatric: No depression, insomnia, anxiety Endocrine: No palpitations, fatigue, diaphoresis, mood  swings, change in appetite, change in weight, increased thirst Hematologic/Lymphatic:  No purpura, petechiae. Allergic/Immunologic: no itchy/runny eyes, nasal congestion, recent allergic reactions, rashes  PHYSICAL EXAM: Vitals:   03/12/17 1101  BP: 104/68  Pulse: 70  SpO2: 96%   General: No acute distress.   Head:  Normocephalic/atraumatic Eyes:  Fundi examined but not visualized Neck: supple, no paraspinal tenderness, full range of motion Heart:  Regular rate and rhythm Lungs:  Clear to auscultation bilaterally Back: No paraspinal tenderness Neurological Exam: alert and oriented to person, place, and time. Attention span and concentration intact, recent and remote memory intact, fund of knowledge intact.  Speech fluent and not dysarthric, language intact.  CN II-XII intact. Bulk and tone normal, muscle strength 5/5 throughout.  Sensation to light touch  intact.  Deep tendon reflexes 2+ throughout.  Finger to nose testing intact.  Gait normal, Romberg negative.  IMPRESSION: Chronic migraine Abnormal brain MRI  PLAN: 1.  For preventative therapy, will try Aimovig. 2.  For abortive therapy, will try Zomig 66m NS 3.  Repeat MRI of brain with and without contrast 4.  Follow up in 3 months.  AMetta Clines DO  CC:  MPhilis Fendt PA-C

## 2017-03-13 ENCOUNTER — Telehealth: Payer: Self-pay | Admitting: Endocrinology

## 2017-03-13 NOTE — Telephone Encounter (Signed)
Please increase tresiba to 70 units per day.

## 2017-03-13 NOTE — Telephone Encounter (Signed)
Blood sugar readings  10/6 8am 121        11:am 142  10/7 6am 166 10/8 9am 189         5pm  319 10/9 10:30pm 161 10/10 8:39am 179

## 2017-03-14 NOTE — Telephone Encounter (Signed)
Pt called back returning your call. Thank you!  Call pt @ [phone].

## 2017-03-14 NOTE — Telephone Encounter (Signed)
Left message for pt to call back  °

## 2017-03-14 NOTE — Telephone Encounter (Signed)
Pt advised and voiced understanding.   

## 2017-03-19 DIAGNOSIS — R079 Chest pain, unspecified: Secondary | ICD-10-CM | POA: Insufficient documentation

## 2017-03-21 ENCOUNTER — Telehealth: Payer: Self-pay | Admitting: Neurology

## 2017-03-21 ENCOUNTER — Telehealth: Payer: Self-pay | Admitting: Endocrinology

## 2017-03-21 NOTE — Telephone Encounter (Signed)
Amovig called regarding this patient and that she was approved and that the patient was aware. Thanks

## 2017-03-21 NOTE — Telephone Encounter (Signed)
Called & left patient a VM to add 5 untis to current dose to see if numbers came down better to target. However if numbers dropped too low to go back to current dose. I asked for her to call & let us know numbers with new dose. Also if she had further questions to please call us back.

## 2017-03-21 NOTE — Telephone Encounter (Signed)
CBG's:  AM: PM: 10/14 178 224 10/15 170 10/16 203 10/17 123 90 10/18 150

## 2017-03-21 NOTE — Telephone Encounter (Signed)
Patient is calling with b/s readings °

## 2017-03-21 NOTE — Telephone Encounter (Signed)
please call patient: I am on the fence between adding 5 units vs continuing the same.  Please let us know.

## 2017-03-26 NOTE — Progress Notes (Addendum)
Rcvd approval for Aimovig 70mg /ml [place] so aj Ref# [id number] Valid: 03/21/17[facility]06/20[facility], advd her of approval, she ran claim thru and it was accepted. Called Pt and advsd her.

## 2017-03-27 ENCOUNTER — Encounter (HOSPITAL_COMMUNITY): Payer: Self-pay | Admitting: Emergency Medicine

## 2017-03-27 DIAGNOSIS — Y999 Unspecified external cause status: Secondary | ICD-10-CM | POA: Diagnosis not present

## 2017-03-27 DIAGNOSIS — Y93K1 Activity, walking an animal: Secondary | ICD-10-CM | POA: Diagnosis not present

## 2017-03-27 DIAGNOSIS — S96801A Unspecified injury of other specified muscles and tendons at ankle and foot level, right foot, initial encounter: Secondary | ICD-10-CM | POA: Insufficient documentation

## 2017-03-27 DIAGNOSIS — X509XXA Other and unspecified overexertion or strenuous movements or postures, initial encounter: Secondary | ICD-10-CM | POA: Insufficient documentation

## 2017-03-27 DIAGNOSIS — Y929 Unspecified place or not applicable: Secondary | ICD-10-CM | POA: Diagnosis not present

## 2017-03-27 NOTE — ED Triage Notes (Signed)
Pt c/o right ankle pain and swelling after a fall tonight. Pt took 4 Naprosyn PTA.

## 2017-03-28 ENCOUNTER — Emergency Department (HOSPITAL_COMMUNITY): Payer: BLUE CROSS/BLUE SHIELD

## 2017-03-28 ENCOUNTER — Emergency Department (HOSPITAL_COMMUNITY)
Admission: EM | Admit: 2017-03-28 | Discharge: 2017-03-28 | Disposition: A | Discharge status: Home / Self Care | Payer: BLUE CROSS/BLUE SHIELD | Attending: Emergency Medicine | Admitting: Emergency Medicine

## 2017-03-28 DIAGNOSIS — S99921A Unspecified injury of right foot, initial encounter: Secondary | ICD-10-CM

## 2017-03-28 DIAGNOSIS — M79676 Pain in unspecified toe(s): Secondary | ICD-10-CM

## 2017-03-28 MED ORDER — HYDROCODONE-ACETAMINOPHEN 5-325 MG PO TABS
1.0000 | ORAL_TABLET | Freq: Once | ORAL | Status: AC
  Administered 2017-03-28: 1 via ORAL
  Filled 2017-03-28: qty 1

## 2017-03-28 MED ORDER — HYDROCODONE-ACETAMINOPHEN 5-325 MG PO TABS: 1.0000 | ORAL_TABLET | ORAL | 0 refills | Status: DC | PRN

## 2017-03-28 MED ORDER — IBUPROFEN 400 MG PO TABS
600.0000 mg | ORAL_TABLET | Freq: Once | ORAL | Status: DC
  Filled 2017-03-28 (×2): qty 1

## 2017-03-28 NOTE — ED Provider Notes (Signed)
[facility] EMERGENCY DEPARTMENT Provider Note   CSN: [id number] Arrival date & time: 03/27/17  2329     History   Chief Complaint Chief Complaint  Patient presents with  . Fall  . Ankle Injury    HPI Kristie Tapia is a 46 y.o. female.  Patient presents with right foot and ankle pain after accident earlier today while walking her dog. She was standing on an elevated surface. Her large dog pulled on his leash and when she braced and pulled back. She hyperflexed her right foot against the edge of the surface she was stonding on. She has had pain and swelling since that time. No other injury.   The history is provided by the patient. No language interpreter was used.  Fall   Ankle Injury     Past Medical History:  Diagnosis Date  . Anemia   . Anxiety   . Complication of anesthesia ` 1998   "lungs collapsed during LEEP procedure"  . Gallstones   . GERD (gastroesophageal reflux disease)   . H/O hiatal hernia   . History of stomach ulcers   . Migraines   . Sleep apnea   . Type II diabetes mellitus [facility])     Patient Active Problem List   Diagnosis Date Noted  . Chest pain 03/19/2017  . Dyslipidemia 02/24/2017  . Diabetes ([place]) 02/24/2017  . Headache 02/24/2017  . Anxiety 02/24/2017    Past Surgical History:  Procedure Laterality Date  . ABDOMINAL EXPLORATION SURGERY     "I've had several"  . ABDOMINAL HYSTERECTOMY  08/1998  . APPENDECTOMY  01/03/2012  . DILATION AND CURETTAGE OF UTERUS    . LAPAROSCOPIC APPENDECTOMY  01/03/2012   Procedure: APPENDECTOMY LAPAROSCOPIC;  Surgeon: Edward Jolly, MD;  Location: [facility];  Service: General;  Laterality: N/A;  . LEEP  1998   "lungs collapsed"  . TUBAL LIGATION  05/1998    OB History    No data available       Home Medications    Prior to Admission medications   Medication Sig Start Date End Date Taking? Authorizing Provider  albuterol (PROVENTIL HFA;VENTOLIN HFA) 108 (90 Base) MCG/ACT  inhaler Inhale 2 puffs into the lungs every 6 (six) hours as needed for wheezing or shortness of breath. 11/24/16   Gale Journey, Damaris Hippo, PA-C  aspirin EC 81 MG tablet Take 1 tablet (81 mg total) by mouth daily. 02/02/17   Tereasa Coop, PA-C  blood glucose meter kit and supplies Dispense based on patient and insurance preference. Four times daily. Dx: E11.9 02/14/16   Tereasa Coop, PA-C  Erenumab-aooe (AIMOVIG) 70 MG/ML SOAJ Inject 70 mg into the skin every 30 (thirty) days. 03/12/17   [name], DO  escitalopram (LEXAPRO) 20 MG tablet TAKE 1 TABLET BY MOUTH ONCE DAILY 02/25/17   Tereasa Coop, PA-C  esomeprazole (NEXIUM) 40 MG capsule TAKE 1 CAPSULE BY MOUTH ONCE DAILY 03/01/17   Tereasa Coop, PA-C  gabapentin (NEURONTIN) 300 MG capsule Take 2 capsules (600 mg total) by mouth 3 (three) times daily. Stat with one tab daily for three days, then add one pill every three days until up two 600 mg three times daily. 02/02/17   Tereasa Coop, PA-C  insulin degludec (TRESIBA FLEXTOUCH) 100 UNIT/ML SOPN FlexTouch Pen Inject 0.45 mLs (45 Units total) into the skin daily. And pen needles 1/day 02/22/17   Renato Shin, MD  LORazepam (ATIVAN) 0.5 MG tablet Take 0.5 mg by  mouth as needed for anxiety.    [provider]  metoprolol-hydrochlorothiazide (LOPRESSOR HCT) 100-25 MG tablet Take 1 tablet by mouth daily.    [provider]  naproxen (NAPROSYN) 500 MG tablet Take 1 tablet with sumatriptan.  May repeat once with sumatriptan if needed. 10/11/16   [name], DO  nortriptyline (PAMELOR) 50 MG capsule Take 1 capsule (50 mg total) by mouth at bedtime. Patient taking differently: Take 25 mg by mouth at bedtime.  10/11/16   Tomi Likens, Adam R, DO  rosuvastatin (CRESTOR) 40 MG tablet Take 1 tablet (40 mg total) by mouth daily. 07/14/16   Tereasa Coop, PA-C  SUMAtriptan (IMITREX) 100 MG tablet TAKE ONE TABLET BY MOUTH EVERY 2 HOURS AS NEEDED FOR  MIGRAINE.  MAY  REPEAT  IN  2  HOURS  IF   HEADACHE  PERSISTS  OR  RECURS 07/17/16   Jaynee Eagles, PA-C  traZODone (DESYREL) 100 MG tablet TAKE ONE TABLET BY MOUTH AT BEDTIME 11/08/16   Tereasa Coop, PA-C  zolmitriptan (ZOMIG) 5 MG nasal solution Place 1 spray into the nose as needed for migraine. 03/12/17   [name], DO    Family History Family History  Problem Relation Age of Onset  . Breast cancer Unknown   . Diabetes Paternal Grandfather     Social History Social History  Substance Use Topics  . Smoking status: Former Smoker    Packs/day: 1.00    Years: 29.00    Types: Cigarettes    Quit date: 03/05/2017  . Smokeless tobacco: Never Used  . Alcohol use No     Comment: hasn't  had s drink in over 5 yrs     Allergies   Adhesive [tape]; Sulfa antibiotics; Decongestant [pseudoephedrine hcl er]; Prednisone; Percocet [oxycodone-acetaminophen]; Zoloft [sertraline hcl]; and Latex   Review of Systems Review of Systems  Musculoskeletal:       See HPI.  Skin: Negative.  Negative for color change and wound.  Neurological: Negative.  Negative for weakness and numbness.     Physical Exam Updated Vital Signs BP 116/76 (BP Location: Left Arm)   Pulse 85   Temp 97.6 F (36.4 C) (Oral)   Resp 18   Ht 5' 5"  (1.651 m)   Wt 80.3 kg (177 lb)   SpO2 97%   BMI 29.45 kg/m   Physical Exam  Constitutional: She is oriented to person, place, and time. She appears well-developed and well-nourished.  Neck: Normal range of motion.  Cardiovascular: Intact distal pulses.   Pulmonary/Chest: Effort normal.  Musculoskeletal:  Right foot mildly swollen without discoloration of lateral forefoot to ankle. No bony deformity. Achilles intact. No calf tenderness.   Neurological: She is alert and oriented to person, place, and time.  Skin: Skin is warm and dry.     ED Treatments / Results  Labs (all labs ordered are listed, but only abnormal results are displayed) Labs Reviewed - No data to display  EKG  EKG  Interpretation None       Radiology Dg Ankle Complete Right  Result Date: 03/28/2017 CLINICAL DATA:  46 year old female with fall and right ankle pain and swelling. EXAM: RIGHT ANKLE - COMPLETE 3+ VIEW; RIGHT FOOT COMPLETE - 3+ VIEW COMPARISON:  Radiograph dated 12/28/2016 FINDINGS: There is no evidence of fracture, dislocation, or joint effusion. There is no evidence of arthropathy or other focal bone abnormality. Soft tissues are unremarkable. IMPRESSION: Negative. Electronically Signed   By: Laren Everts.D.  On: 03/28/2017 00:21   Dg Foot Complete Right  Result Date: 03/28/2017 CLINICAL DATA:  46 year old female with fall and right ankle pain and swelling. EXAM: RIGHT ANKLE - COMPLETE 3+ VIEW; RIGHT FOOT COMPLETE - 3+ VIEW COMPARISON:  Radiograph dated 12/28/2016 FINDINGS: There is no evidence of fracture, dislocation, or joint effusion. There is no evidence of arthropathy or other focal bone abnormality. Soft tissues are unremarkable. IMPRESSION: Negative. Electronically Signed   By: Anner Crete M.D.   On: 03/28/2017 00:21    Procedures Procedures (including critical care time)  Medications Ordered in ED Medications  ibuprofen (ADVIL,MOTRIN) tablet 600 mg (600 mg Oral Not Given 03/28/17 0140)     Initial Impression / Assessment and Plan / ED Course  I have reviewed the triage vital signs and the nursing notes.  Pertinent labs & imaging results that were available during my care of the patient were reviewed by me and considered in my medical decision making (see chart for details).     Imaging for evaluation of right foot/ankle injury is negative for fracture injury, supporting soft tissue strain injury only. ASO and crutches will be provided. She is already taking Naproxen regularly. Will provide #5 Norco. REcommend follow up with her orthopedist if the pain is no better in 3-4 days.   Final Clinical Impressions(s) / ED Diagnoses   Final diagnoses:  None   1.  Right foot injury.   New Prescriptions New Prescriptions   No medications on file     Charlann Lange, Hershal Coria 03/28/17 0158    Forde Dandy, MD 03/28/17 [phone]

## 2017-03-28 NOTE — ED Notes (Signed)
Ortho tech paged and aware of need for ASO and crutches

## 2017-03-28 NOTE — Progress Notes (Signed)
Orthopedic Tech Progress Note Patient Details:  Kristie ReeveLora Tapia 1971/03/15 [id number]  Ortho Devices Type of Ortho Device: ASO, Crutches Ortho Device/Splint Location: rle Ortho Device/Splint Interventions: Ordered, Application, Adjustment   Trinna PostMartinez, [name] J 03/28/2017, 3:04 AM

## 2017-03-29 ENCOUNTER — Telehealth: Payer: Self-pay | Admitting: Endocrinology

## 2017-03-29 NOTE — Telephone Encounter (Signed)
please call patient: Please increase insulin to 85 units qam I'll see you next time.

## 2017-03-29 NOTE — Telephone Encounter (Signed)
Patient calling with weekly readings: 10/20 8 am= 199 10/21 2:45 pm= 293 10/21 4pm= 310 10/22 10:45 am=222 10/22 5:30 pm=152 10/23 9am=131 10/23 6pm= 262 10/24 9am= 227 10/24 2pm= 214 10/24 7:42 pm= 258 10/25 9:30am= 167 10/26 8am= 294 75 units every day for the week

## 2017-03-29 NOTE — Telephone Encounter (Signed)
Notified patient & she stated she would let us know how blood sugars were doing in about a week.

## 2017-04-02 ENCOUNTER — Ambulatory Visit
Admission: RE | Admit: 2017-04-02 | Discharge: 2017-04-02 | Disposition: A | Discharge status: Home / Self Care | Payer: BLUE CROSS/BLUE SHIELD | Source: Ambulatory Visit | Attending: Neurology | Admitting: Neurology

## 2017-04-02 DIAGNOSIS — R9089 Other abnormal findings on diagnostic imaging of central nervous system: Secondary | ICD-10-CM

## 2017-04-02 MED ORDER — GADOBENATE DIMEGLUMINE 529 MG/ML IV SOLN
15.0000 mL | Freq: Once | INTRAVENOUS | Status: AC | PRN
  Administered 2017-04-02: 15 mL via INTRAVENOUS

## 2017-04-04 ENCOUNTER — Other Ambulatory Visit: Payer: Self-pay

## 2017-04-04 ENCOUNTER — Telehealth: Payer: Self-pay | Admitting: Neurology

## 2017-04-04 MED ORDER — DICLOFENAC POTASSIUM(MIGRAINE) 50 MG PO PACK: 50.0000 mg | PACK | ORAL | 3 refills | Status: DC

## 2017-04-04 NOTE — Telephone Encounter (Signed)
Called and spoke with Pt, advsd her will leave samples at the front desk for cambia and she can have someone else pick them up if she's unable to make it during our business hours. Sent in Rx for Cambia to [facility] as the [facility] advsd us to do to have it covered.

## 2017-04-04 NOTE — Telephone Encounter (Signed)
I spoke with Kristie Tapia regarding MRI results.  It shows old tiny strokes.  She has stroke risk factors (smoker, DM, HTN, HLD).  She is already on ASA 81mg  daily, statin therapy, medication for blood pressure and diabetes.  Advised to stop triptans.  Instead, I want her to try Cambia.

## 2017-04-09 ENCOUNTER — Other Ambulatory Visit: Payer: Self-pay | Admitting: Physician Assistant

## 2017-04-09 DIAGNOSIS — R1013 Epigastric pain: Secondary | ICD-10-CM

## 2017-04-09 DIAGNOSIS — G8929 Other chronic pain: Secondary | ICD-10-CM

## 2017-04-09 DIAGNOSIS — K130 Diseases of lips: Secondary | ICD-10-CM

## 2017-04-09 DIAGNOSIS — Z760 Encounter for issue of repeat prescription: Secondary | ICD-10-CM

## 2017-04-10 NOTE — Telephone Encounter (Signed)
Please advise/refill PROAIR HFA 108 (90 Base) MCG/ACT inhaler

## 2017-05-01 DIAGNOSIS — Z0271 Encounter for disability determination: Secondary | ICD-10-CM

## 2017-05-04 ENCOUNTER — Other Ambulatory Visit: Payer: Self-pay | Admitting: Physician Assistant

## 2017-05-04 DIAGNOSIS — J9801 Acute bronchospasm: Secondary | ICD-10-CM

## 2017-05-10 ENCOUNTER — Ambulatory Visit: Payer: BLUE CROSS/BLUE SHIELD | Admitting: Endocrinology

## 2017-05-19 ENCOUNTER — Other Ambulatory Visit: Payer: Self-pay | Admitting: Physician Assistant

## 2017-05-19 DIAGNOSIS — R1013 Epigastric pain: Principal | ICD-10-CM

## 2017-05-19 DIAGNOSIS — G8929 Other chronic pain: Secondary | ICD-10-CM

## 2017-05-21 ENCOUNTER — Ambulatory Visit: Payer: BLUE CROSS/BLUE SHIELD | Admitting: Endocrinology

## 2017-05-21 ENCOUNTER — Encounter: Payer: Self-pay | Admitting: Endocrinology

## 2017-05-21 VITALS — BP 138/72 | HR 89 | Wt 188.2 lb

## 2017-05-21 DIAGNOSIS — Z794 Long term (current) use of insulin: Secondary | ICD-10-CM | POA: Diagnosis not present

## 2017-05-21 DIAGNOSIS — E1142 Type 2 diabetes mellitus with diabetic polyneuropathy: Secondary | ICD-10-CM

## 2017-05-21 LAB — POCT GLYCOSYLATED HEMOGLOBIN (HGB A1C): Hemoglobin A1C: 11.9

## 2017-05-21 MED ORDER — INSULIN DEGLUDEC 100 UNIT/ML ~~LOC~~ SOPN: 85.0000 [IU] | PEN_INJECTOR | Freq: Every day | SUBCUTANEOUS | 11 refills | Status: DC

## 2017-05-21 MED ORDER — SEMAGLUTIDE(0.25 OR 0.5MG/DOS) 2 MG/1.5ML ~~LOC~~ SOPN: 0.5000 mg | PEN_INJECTOR | SUBCUTANEOUS | 11 refills | Status: DC

## 2017-05-21 NOTE — Progress Notes (Signed)
Subjective:    Patient ID: Kristie Tapia, female    DOB: 1970-10-12, 46 y.o.   MRN: [id number]  HPI  Pt returns for f/u of diabetes mellitus: DM type: Insulin-requiring type 2.  Dx'ed: 4627 Complications: polyneuropathy Therapy: insulin since 2001 GDM: never DKA: never Severe hypoglycemia: never Pancreatitis: never Pancreatic imaging: normal on 2017 CT Other: she is not a candidate for multiple daily injections, due to noncompliance. Interval history: She takes tresiba, 45 units qd, and reg, 20 units qd.  She does not check cbg's.  She wants to add "ozempic."  pt states she feels well in general. Past Medical History:  Diagnosis Date  . Anemia   . Anxiety   . Complication of anesthesia ` 1998   "lungs collapsed during LEEP procedure"  . Gallstones   . GERD (gastroesophageal reflux disease)   . H/O hiatal hernia   . History of stomach ulcers   . Migraines   . Sleep apnea   . Type II diabetes mellitus ([place])     Past Surgical History:  Procedure Laterality Date  . ABDOMINAL EXPLORATION SURGERY     "I've had several"  . ABDOMINAL HYSTERECTOMY  08/1998  . APPENDECTOMY  01/03/2012  . DILATION AND CURETTAGE OF UTERUS    . LAPAROSCOPIC APPENDECTOMY  01/03/2012   Procedure: APPENDECTOMY LAPAROSCOPIC;  Surgeon: Edward Jolly, MD;  Location: [facility];  Service: General;  Laterality: N/A;  . LEEP  1998   "lungs collapsed"  . TUBAL LIGATION  05/1998    Social History   Socioeconomic History  . Marital status: Single    Spouse name: Not on file  . Number of children: Not on file  . Years of education: Not on file  . Highest education level: Not on file  Social Needs  . Financial resource strain: Not on file  . Food insecurity - worry: Not on file  . Food insecurity - inability: Not on file  . Transportation needs - medical: Not on file  . Transportation needs - non-medical: Not on file  Occupational History  . Not on file  Tobacco Use  . Smoking status: Former  Smoker    Packs/day: 1.00    Years: 29.00    Pack years: 29.00    Types: Cigarettes    Last attempt to quit: 03/05/2017    Years since quitting: 0.2  . Smokeless tobacco: Never Used  Substance and Sexual Activity  . Alcohol use: No    Comment: hasn't  had s drink in over 5 yrs  . Drug use: No  . Sexual activity: Yes  Other Topics Concern  . Not on file  Social History Narrative  . Not on file    Current Outpatient Medications on File Prior to Visit  Medication Sig Dispense Refill  . albuterol (PROVENTIL HFA;VENTOLIN HFA) 108 (90 Base) MCG/ACT inhaler Inhale 2 puffs into the lungs every 6 (six) hours as needed for wheezing or shortness of breath. 1 Inhaler 0  . aspirin EC 81 MG tablet Take 1 tablet (81 mg total) by mouth daily. 365 tablet 0  . blood glucose meter kit and supplies Dispense based on patient and insurance preference. Four times daily. Dx: E11.9 1 each 0  . Erenumab-aooe (AIMOVIG) 70 MG/ML SOAJ Inject 70 mg into the skin every 30 (thirty) days. 1 pen 11  . escitalopram (LEXAPRO) 20 MG tablet TAKE 1 TABLET BY MOUTH ONCE DAILY 90 tablet 1  . esomeprazole (NEXIUM) 40 MG capsule Take 1 capsule (  40 mg total) by mouth daily. Office visit needed 90 capsule 0  . gabapentin (NEURONTIN) 300 MG capsule Take 2 capsules (600 mg total) by mouth 3 (three) times daily. Stat with one tab daily for three days, then add one pill every three days until up two 600 mg three times daily. 268 capsule 1  . LORazepam (ATIVAN) 0.5 MG tablet Take 0.5 mg by mouth as needed for anxiety.    . metoprolol-hydrochlorothiazide (LOPRESSOR HCT) 100-25 MG tablet Take 1 tablet by mouth daily.    . naproxen (NAPROSYN) 500 MG tablet Take 1 tablet with sumatriptan.  May repeat once with sumatriptan if needed. 16 tablet 2  . nortriptyline (PAMELOR) 50 MG capsule Take 1 capsule (50 mg total) by mouth at bedtime. (Patient taking differently: Take 25 mg by mouth at bedtime. ) 30 capsule 2  . PROAIR HFA 108 (90 Base)  MCG/ACT inhaler INHALE TWO PUFFS BY MOUTH EVERY 4 HOURS AS NEEDED FOR  WHEEZING  OR  SHORTNESS  OF  BREATH 9 each 0  . rosuvastatin (CRESTOR) 40 MG tablet Take 1 tablet (40 mg total) by mouth daily. 90 tablet 3  . traZODone (DESYREL) 100 MG tablet TAKE ONE TABLET BY MOUTH AT BEDTIME 30 tablet 11  . zolmitriptan (ZOMIG) 5 MG nasal solution Place 1 spray into the nose as needed for migraine. 6 Units 0  . Diclofenac Potassium 50 MG PACK Take 50 mg by mouth as directed. (Patient not taking: Reported on 05/21/2017) 9 each 3  . HYDROcodone-acetaminophen (NORCO/VICODIN) 5-325 MG tablet Take 1 tablet by mouth every 4 (four) hours as needed. (Patient not taking: Reported on 05/21/2017) 5 tablet 0  . SUMAtriptan (IMITREX) 100 MG tablet TAKE ONE TABLET BY MOUTH EVERY 2 HOURS AS NEEDED FOR  MIGRAINE.  MAY  REPEAT  IN  2  HOURS  IF  HEADACHE  PERSISTS  OR  RECURS (Patient not taking: Reported on 05/21/2017) 30 tablet 0   No current facility-administered medications on file prior to visit.     Allergies  Allergen Reactions  . Adhesive [Tape] Other (See Comments)    Blisters USE PAPER TAPE ONLY  . Sulfa Antibiotics Other (See Comments)    Burns the inside of my mouth; "blisters; leaves tongue and inside of mouth solid red"  . Decongestant [Pseudoephedrine Hcl Er] Itching    ALL DECONGESTANTS  . Prednisone Other (See Comments)    Gives me a bad attitude  . Percocet [Oxycodone-Acetaminophen] Other (See Comments)    PT reports having nightmares when taking Percocet  . Zoloft [Sertraline Hcl]   . Latex Itching    Family History  Problem Relation Age of Onset  . Breast cancer Unknown   . Diabetes Paternal Grandfather     BP 138/72 (BP Location: Left Arm, Patient Position: Sitting, Cuff Size: Normal)   Pulse 89   Wt 188 lb 3.2 oz (85.4 kg)   SpO2 95%   BMI 31.32 kg/m    Review of Systems She denies hypoglycemia.  She has gained weight.      Objective:   Physical Exam VITAL SIGNS:  See vs  page GENERAL: no distress Pulses: foot pulses are intact bilaterally.   MSK: no deformity of the feet or ankles.  CV: no edema of the legs or ankles.  Skin:  no ulcer on the feet or ankles.  normal color and temp on the feet and ankles.   Neuro: sensation is intact to touch on the feet and ankles, but  decreased from normal.    A1c=11.9%    Assessment & Plan:  Insulin-requiring type 2 DM: poor glycemic control Noncompliance with cbg's and insulin.  We'll have to titrate based on a1c.   Patient Instructions  check your blood sugar once a day.  vary the time of day when you check, between before the 3 meals, and at bedtime.  also check if you have symptoms of your blood sugar being too high or too low.  please keep a record of the readings and bring it to your next appointment here (or you can bring the meter itself).  You can write it on any piece of paper.  please call us sooner if your blood sugar goes below 70, or if you have a lot of readings over 200. I have sent prescriptions to your pharmacy, to increase tresiba back to up 85 units daily, and to add "ozempic."  This is the only insulin you should take.   Please come back for a follow-up appointment in 2 months.

## 2017-05-21 NOTE — Patient Instructions (Addendum)
check your blood sugar once a day.  vary the time of day when you check, between before the 3 meals, and at bedtime.  also check if you have symptoms of your blood sugar being too high or too low.  please keep a record of the readings and bring it to your next appointment here (or you can bring the meter itself).  You can write it on any piece of paper.  please call us sooner if your blood sugar goes below 70, or if you have a lot of readings over 200. I have sent prescriptions to your pharmacy, to increase tresiba back to up 85 units daily, and to add "ozempic."  This is the only insulin you should take.   Please come back for a follow-up appointment in 2 months.

## 2017-06-01 ENCOUNTER — Telehealth: Payer: Self-pay | Admitting: Physician Assistant

## 2017-06-01 NOTE — Telephone Encounter (Signed)
Called pt to see if she still needed to schedule an appt. We do not have any left for today. She did ask as to why we have not sent a med refill for her gabapentin - she said that her pharmacy has requested it twice from us.   Please call pt at [phone]

## 2017-06-03 ENCOUNTER — Encounter: Payer: Self-pay | Admitting: Urgent Care

## 2017-06-03 ENCOUNTER — Ambulatory Visit: Payer: BLUE CROSS/BLUE SHIELD | Admitting: Urgent Care

## 2017-06-03 VITALS — BP 136/82 | HR 95 | Temp 98.0°F | Resp 18 | Ht 65.0 in | Wt 190.0 lb

## 2017-06-03 DIAGNOSIS — F17200 Nicotine dependence, unspecified, uncomplicated: Secondary | ICD-10-CM

## 2017-06-03 DIAGNOSIS — R059 Cough, unspecified: Secondary | ICD-10-CM

## 2017-06-03 DIAGNOSIS — E1142 Type 2 diabetes mellitus with diabetic polyneuropathy: Secondary | ICD-10-CM | POA: Diagnosis not present

## 2017-06-03 DIAGNOSIS — J9801 Acute bronchospasm: Secondary | ICD-10-CM | POA: Diagnosis not present

## 2017-06-03 DIAGNOSIS — Z794 Long term (current) use of insulin: Secondary | ICD-10-CM | POA: Diagnosis not present

## 2017-06-03 DIAGNOSIS — R05 Cough: Secondary | ICD-10-CM | POA: Diagnosis not present

## 2017-06-03 MED ORDER — ALBUTEROL SULFATE HFA 108 (90 BASE) MCG/ACT IN AERS: 2.0000 | INHALATION_SPRAY | Freq: Four times a day (QID) | RESPIRATORY_TRACT | 0 refills | Status: DC | PRN

## 2017-06-03 MED ORDER — GABAPENTIN 300 MG PO CAPS: 300.0000 mg | ORAL_CAPSULE | Freq: Three times a day (TID) | ORAL | 0 refills | Status: DC

## 2017-06-03 MED ORDER — AZITHROMYCIN 250 MG PO TABS: ORAL_TABLET | ORAL | 0 refills | Status: DC

## 2017-06-03 NOTE — Patient Instructions (Addendum)
For cough try using a honey-based tea. Use 2 teaspoons of honey with juice squeezed from half lemon. Place shaved pieces of ginger into 1/2-1 cup of water and warm over stove top. Then mix the ingredients and repeat every 4 hours as needed.    Cough, Adult Coughing is a reflex that clears your throat and your airways. Coughing helps to heal and protect your lungs. It is normal to cough occasionally, but a cough that happens with other symptoms or lasts a long time may be a sign of a condition that needs treatment. A cough may last only 2-3 weeks (acute), or it may last longer than 8 weeks (chronic). What are the causes? Coughing is commonly caused by:  Breathing in substances that irritate your lungs.  A viral or bacterial respiratory infection.  Allergies.  Asthma.  Postnasal drip.  Smoking.  Acid backing up from the stomach into the esophagus (gastroesophageal reflux).  Certain medicines.  Chronic lung problems, including COPD (or rarely, lung cancer).  Other medical conditions such as heart failure.  Follow these instructions at home: Pay attention to any changes in your symptoms. Take these actions to help with your discomfort:  Take medicines only as told by your health care provider. ? If you were prescribed an antibiotic medicine, take it as told by your health care provider. Do not stop taking the antibiotic even if you start to feel better. ? Talk with your health care provider before you take a cough suppressant medicine.  Drink enough fluid to keep your urine clear or pale yellow.  If the air is dry, use a cold steam vaporizer or humidifier in your bedroom or your home to help loosen secretions.  Avoid anything that causes you to cough at work or at home.  If your cough is worse at night, try sleeping in a semi-upright position.  Avoid cigarette smoke. If you smoke, quit smoking. If you need help quitting, ask your health care provider.  Avoid  caffeine.  Avoid alcohol.  Rest as needed.  Contact a health care provider if:  You have new symptoms.  You cough up pus.  Your cough does not get better after 2-3 weeks, or your cough gets worse.  You cannot control your cough with suppressant medicines and you are losing sleep.  You develop pain that is getting worse or pain that is not controlled with pain medicines.  You have a fever.  You have unexplained weight loss.  You have night sweats. Get help right away if:  You cough up blood.  You have difficulty breathing.  Your heartbeat is very fast. This information is not intended to replace advice given to you by your health care provider. Make sure you discuss any questions you have with your health care provider. Document Released: 11/17/2010 Document Revised: 10/27/2015 Document Reviewed: 07/28/2014 Elsevier Interactive Patient Education  2018 [employer]    IF you received an x-ray today, you will receive an invoice from [facility]. Please contact [facility] at [phone] with questions or concerns regarding your invoice.   IF you received labwork today, you will receive an invoice from [facility]. Please contact [facility] at [phone] with questions or concerns regarding your invoice.   Our billing staff will not be able to assist you with questions regarding bills from these companies.  You will be contacted with the lab results as soon as they are available. The fastest way to get your results is to activate your My Chart account. Instructions  are located on the last page of this paperwork. If you have not heard from [place] regarding the results in 2 weeks, please contact this office.

## 2017-06-03 NOTE — Progress Notes (Signed)
    MRN: [id number] DOB: 05-Oct-1970  Subjective:   Kristie Tapia is a 46 y.o. female presenting for 4 week history of productive cough, chest congestion, post-nasal drainage, subjective fever. Symptoms initially started out with sinus congestion, right ear discomfort, left-sided neck soreness. Takes phenergan, Alka-seltzer night time, Afrin. Denies chest pain, shob, wheezing, vomiting, abdominal pain. Smokes 1ppd. Denies history of asthma, COPD. Denies history of HTN.  Kristie Tapia has a current medication list which includes the following prescription(s): albuterol, aspirin ec, blood glucose meter kit and supplies, diclofenac potassium, erenumab-aooe, escitalopram, esomeprazole, gabapentin, insulin degludec, lorazepam, metoprolol-hydrochlorothiazide, naproxen, nortriptyline, proair hfa, rosuvastatin, semaglutide, trazodone, and zolmitriptan. Also is allergic to adhesive [tape]; sulfa antibiotics; decongestant [pseudoephedrine hcl er]; prednisone; percocet [oxycodone-acetaminophen]; zoloft [sertraline hcl]; and latex.  Kristie Tapia  has a past medical history of Anemia, Anxiety, Complication of anesthesia (` 1998), Gallstones, GERD (gastroesophageal reflux disease), H/O hiatal hernia, History of stomach ulcers, Migraines, Sleep apnea, and Type II diabetes mellitus ([place]). Also  has a past surgical history that includes Appendectomy (01/03/2012); Abdominal hysterectomy (08/1998); Dilation and curettage of uterus; Abdominal exploration surgery; Tubal ligation (05/1998); LEEP (1998); and laparoscopic appendectomy (01/03/2012).  Objective:   Vitals: BP 136/82   Pulse 95   Temp 98 F (36.7 C) (Oral)   Resp 18   Ht '5\' 5"'$  (1.651 m)   Wt 190 lb (86.2 kg)   SpO2 99%   BMI 31.62 kg/m   Physical Exam  Constitutional: She is oriented to person, place, and time. She appears well-developed and well-nourished.  HENT:  Mouth/Throat: Oropharynx is clear and moist.  Eyes: No scleral icterus.  Neck: Normal range of motion.  Neck supple.  Cardiovascular: Normal rate, regular rhythm and intact distal pulses. Exam reveals no gallop and no friction rub.  No murmur heard. Pulmonary/Chest: No respiratory distress. She has no wheezes. She has no rales.  Diminished lung sounds in bibasilar fields.  Lymphadenopathy:    She has no cervical adenopathy.  Neurological: She is alert and oriented to person, place, and time.  Skin: Skin is warm and dry.  Psychiatric: She has a normal mood and affect.   Assessment and Plan :   Cough  Bronchospasm - Plan: albuterol (PROVENTIL HFA;VENTOLIN HFA) 108 (90 Base) MCG/ACT inhaler  Tobacco use disorder  Type 2 diabetes mellitus with diabetic polyneuropathy, with long-term current use of insulin ([facility]) - Plan: gabapentin (NEURONTIN) 300 MG capsule   Will cover for infectious process with azithromycin. Use honey based tea for cough suppression since patient states that she does not want Tessalon and has multiple sensitivities to medications. She is to schedule albuterol inhaler. Counseled that not smoking may help her resolve her symptoms but patient is not agreeable to this. At the end of her visit she requested a refill of her gabapentin. Provided her with a courtesy refill, she is to follow up with PA-Clark, her PCP.  Jaynee Eagles, PA-C [facility] [phone] 06/03/2017  4:39 PM

## 2017-06-06 ENCOUNTER — Encounter (HOSPITAL_COMMUNITY): Payer: Self-pay | Admitting: Emergency Medicine

## 2017-06-06 ENCOUNTER — Ambulatory Visit (HOSPITAL_COMMUNITY)
Admission: EM | Admit: 2017-06-06 | Discharge: 2017-06-06 | Disposition: A | Discharge status: Home / Self Care | Payer: BLUE CROSS/BLUE SHIELD | Attending: Physician Assistant | Admitting: Physician Assistant

## 2017-06-06 DIAGNOSIS — W57XXXA Bitten or stung by nonvenomous insect and other nonvenomous arthropods, initial encounter: Secondary | ICD-10-CM

## 2017-06-06 DIAGNOSIS — S30861A Insect bite (nonvenomous) of abdominal wall, initial encounter: Secondary | ICD-10-CM | POA: Diagnosis not present

## 2017-06-06 MED ORDER — DOXYCYCLINE HYCLATE 100 MG PO CAPS: 100.0000 mg | ORAL_CAPSULE | Freq: Two times a day (BID) | ORAL | 0 refills | Status: AC

## 2017-06-06 NOTE — ED Provider Notes (Signed)
06/06/2017 7:28 PM   DOB: 16-Mar-1971 / MRN: [id number]  SUBJECTIVE:  Kristie Tapia is a 47 y.o. female with a history of poorly controlled diabetes presenting for a tic bite.  Tells me she pulled the tic off today and thinks it was attached for about 6 hours. She has a small bruise where the tic bit her and she is very concerned about this.   Immunization History  Administered Date(s) Administered  . Pneumococcal Polysaccharide-23 05/19/2012  . Tdap 05/19/2012     She is allergic to adhesive [tape]; sulfa antibiotics; decongestant [pseudoephedrine hcl er]; prednisone; percocet [oxycodone-acetaminophen]; zoloft [sertraline hcl]; and latex.   She  has a past medical history of Anemia, Anxiety, Complication of anesthesia (` 1998), Gallstones, GERD (gastroesophageal reflux disease), H/O hiatal hernia, History of stomach ulcers, Migraines, Sleep apnea, and Type II diabetes mellitus ([facility]).    She  reports that she has been smoking cigarettes.  She has a 29.00 pack-year smoking history. she has never used smokeless tobacco. She reports that she does not drink alcohol or use drugs. She  reports that she currently engages in sexual activity. The patient  has a past surgical history that includes Appendectomy (01/03/2012); Abdominal hysterectomy (08/1998); Dilation and curettage of uterus; Abdominal exploration surgery; Tubal ligation (05/1998); LEEP (1998); and laparoscopic appendectomy (01/03/2012).  Her family history includes Breast cancer in her unknown relative; Diabetes in her paternal grandfather.  Review of Systems  Constitutional: Negative for chills, diaphoresis and fever.  Eyes: Negative.  Negative for photophobia.  Cardiovascular: Negative for chest pain, orthopnea and leg swelling.  Gastrointestinal: Negative for abdominal pain and nausea.  Skin: Positive for rash. Negative for itching.  Neurological: Negative for dizziness and headaches.    OBJECTIVE:  BP 135/79   Pulse 92   Temp  98.2 F (36.8 C) (Oral)   Resp 16   Wt 190 lb (86.2 kg)   SpO2 99%   BMI 31.62 kg/m   Physical Exam  Constitutional: She is active.  Non-toxic appearance.  Cardiovascular: Normal rate, regular rhythm, S1 normal, S2 normal, normal heart sounds and intact distal pulses. Exam reveals no gallop, no friction rub and no decreased pulses.  No murmur heard. Pulmonary/Chest: Effort normal. No stridor. No tachypnea. No respiratory distress. She has no wheezes. She has no rales.  Abdominal: She exhibits no distension.    Musculoskeletal: She exhibits no edema.  Neurological: She is alert.  Skin: Skin is warm and dry. She is not diaphoretic. No pallor.    No results found for this or any previous visit (from the past 72 hour(s)).  No results found.  ASSESSMENT AND PLAN:  The encounter diagnosis was Tick bite, initial encounter. TD current. Doxy if any fever or photophobia and [facility], otherwise advised she apply topical abx ointment and hold ABX.     The patient is advised to call or return to clinic if she does not see an improvement in symptoms, or to seek the care of the closest emergency department if she worsens with the above plan.   Deliah BostonMichael Emmons Toth, MHS, PA-C 06/06/2017 7:28 PM    Ofilia Neaslark, Darin Redmann L, PA-C 06/06/17 1929

## 2017-06-06 NOTE — Discharge Instructions (Signed)
Okay to fill the antibiotic if new onset fever or photophobia.  If you need to start the antibiotic then I should see you back in my clinic for follow up.

## 2017-06-06 NOTE — ED Triage Notes (Signed)
PT removed a tick from lower abdomen 1 hour ago. PT brought tick with her. Area is read and swollen.

## 2017-06-12 ENCOUNTER — Ambulatory Visit: Payer: BLUE CROSS/BLUE SHIELD | Admitting: Neurology

## 2017-06-12 ENCOUNTER — Encounter: Payer: Self-pay | Admitting: Neurology

## 2017-06-12 ENCOUNTER — Other Ambulatory Visit: Payer: BLUE CROSS/BLUE SHIELD

## 2017-06-12 VITALS — BP 130/80 | HR 92 | Ht 64.5 in | Wt 189.2 lb

## 2017-06-12 DIAGNOSIS — G4485 Primary stabbing headache: Secondary | ICD-10-CM

## 2017-06-12 DIAGNOSIS — R413 Other amnesia: Secondary | ICD-10-CM | POA: Diagnosis not present

## 2017-06-12 DIAGNOSIS — G43109 Migraine with aura, not intractable, without status migrainosus: Secondary | ICD-10-CM

## 2017-06-12 LAB — TSH: TSH: 3.14 m[IU]/L

## 2017-06-12 LAB — VITAMIN B12: VITAMIN B 12: 442 pg/mL (ref 200–1100)

## 2017-06-12 MED ORDER — TOPIRAMATE ER 50 MG PO SPRINKLE CAP24: 1.0000 | EXTENDED_RELEASE_CAPSULE | Freq: Every day | ORAL | 0 refills | Status: DC

## 2017-06-12 MED ORDER — METOCLOPRAMIDE HCL 10 MG PO TABS: 10.0000 mg | ORAL_TABLET | Freq: Four times a day (QID) | ORAL | 2 refills | Status: DC | PRN

## 2017-06-12 NOTE — Patient Instructions (Addendum)
1.  We will start extended release topiramate 50mg  at bedtime.  If headaches not improved in 4 weeks, contact me and we can increase dose. 2.  When you get a migraine, take metoclopramide 25mg  at earliest onset.  It is an anti-nausea medication too, so don't take it with Phenergan.   3.  To assess memory, we will check TSH, B12 and refer you for neuropsychological testing 4.  Follow up in 3 months.

## 2017-06-12 NOTE — Progress Notes (Signed)
NEUROLOGY FOLLOW UP OFFICE NOTE  [name] [id number]  HISTORY OF PRESENT ILLNESS: Kristie Tapia is a 47 year old female with type 2 diabetes, fibromyalgia, and anxiety who follows up for migraine.   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  Based on these findings, we discontinued triptans.  I  Migraines: Aimovig reduced frequency of migraines. Intensity:  7-8/10 Duration:  All day Frequency:  3 to 4 days a week  II  Primary Stabbing Headache Intensity:  Severe Duration:  20 seconds Frequency:  Once a month  III Bi-temporal headache/tension type: Intensity:  Moderate Duration:  All day Frequency:  Kristie Tapia  Frequency of abortive medication: almost daily Current NSAIDS:  Cambia (unable to tolerate) Current analgesics:  Excedrin Migraine and Tension Current triptans:  no Current anti-emetic:  promethazine 46m Current muscle relaxants:  Skelaxin Current anti-anxiolytic:  no Current sleep aide:  trazodone Current Antihypertensive medications:  no Current Antidepressant medications:  Lexapro 215m nortriptyline 2557murrent Anticonvulsant medications:  Gabapentin 900m18mice daily Current anti-CGRP:  Aimovig Current Vitamins/Herbal/Supplements:  Omega 3 Current Antihistamines/Decongestants:  Flonase Other therapy:  No  She reports worsening memory over the past year.  She frequently forgets why she walked into a room.  [facility] what she wanted to say during conversation.  She denies family history of dementia.  She also notices that her right hand may twitch or tremor in use or when holding something.     Caffeine:  Dr. PeppMalachi Bondseet tea Smoker:  yes Diet:  Dr. PeppMalachi Bondseet tea.  No water. Exercise:  no Depression/anxiety:  anxiety Sleep hygiene:  Poor    HISTORY:  Onset:  6th grade I  Migraines: Location:  Bi-temporal/parietal and travels to occiput and back of neck Quality:  pressure Initial Intensity:  7-8/10 Aura:  Squiggly lines in vision of one or both eyes for 30 minutes prior to onset of headache and then resolves. Prodrome:  no Associated symptoms:  Nausea, photophobia, phonopohobia.  She has not had any new worse headache of her life, waking up from sleep Initial Duration:  All day (used to respond to sumatriptan 50mg68m1 hour) Initial Frequency:  Varies.  Some months it may be daily, sometimes once a week Initial Frequency of abortive medication: 1 to 2 days a week Triggers/exacerbating factors:  stress Relieving factors:  sleep Activity:  Aggravates  II  She also has primary stabbing headaches that last 20 seconds III  She also has bilateral temporal headaches as well.   Past NSAIDS:  ibuprofen, naproxen, Toradol injection (ineffective) Past analgesics:  tramadol, oxycodone Past abortive triptans:  sumatriptan 100mg,93malt 5mg Pa28mmuscle relaxants:  Robaxin, tizanidine Past anti-emetic:  Zofran Past antihypertensive medications:  no Past antidepressant medications:  no Past anticonvulsant medications:  topiramate (helped in conjunction with other medications that she can't remember) Past vitamins/Herbal/Supplements:  no Past antihistamines/decongestants:  no Other past therapies:  no   Family history of headache:  no   To assess right sided mandibular pain, an X-ray of the mandible was performed on 07/14/16 and revealed significant dental disease in the right mandibular molars.  She had an MRI  of the brain without contrast on 05/01/13 at an outside facility that demonstrated an ovoid area of hyperattenuation on T2 weighted images within the right thalamus  measuring less than 5 mm in diameter, as well as punctate are of signal hyperintensity within the right superior temporal lobe at the gray-white junction and a punctate hyperintense focus and a linear juxtacortical lesion within the right frontal lobe concerning for demyelination.  Follow up MRI with contrast from 05/09/13 revealed no enhancement.  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  PAST MEDICAL HISTORY: Past Medical History:  Diagnosis Date  . Anemia   . Anxiety   . Complication of anesthesia ` 1998   "lungs collapsed during LEEP procedure"  . Gallstones   . GERD (gastroesophageal reflux disease)   . H/O hiatal hernia   . History of stomach ulcers   . Migraines   . Sleep apnea   . Type II diabetes mellitus (HCC)     MEDICATIONS: Current Outpatient Medications on File Prior to Visit  Medication Sig Dispense Refill  . albuterol (PROVENTIL HFA;VENTOLIN HFA) 108 (90 Base) MCG/ACT inhaler Inhale 2 puffs into the lungs every 6 (six) hours as needed for wheezing or shortness of breath. 1 Inhaler 0  . aspirin EC 81 MG tablet Take 1 tablet (81 mg total) by mouth daily. 365 tablet 0  . azithromycin (ZITHROMAX) 250 MG tablet Start with 2 tablets today, then 1 daily thereafter. 6 tablet 0  . blood glucose meter kit and supplies Dispense based on patient and insurance preference. Four times daily. Dx: E11.9 1 each 0  . Diclofenac Potassium 50 MG PACK Take 50 mg by mouth as directed. 9 each 3  . doxycycline (VIBRAMYCIN) 100 MG capsule Take 1 capsule (100 mg total) by mouth 2 (two) times daily for 10 days. 20 capsule 0  . Erenumab-aooe (AIMOVIG) 70 MG/ML SOAJ Inject 70 mg into the skin every 30 (thirty) days. 1 pen 11  . escitalopram (LEXAPRO) 20 MG tablet TAKE 1 TABLET BY MOUTH ONCE  DAILY 90 tablet 1  . esomeprazole (NEXIUM) 40 MG capsule Take 1 capsule (40 mg total) by mouth daily. Office visit needed 90 capsule 0  . gabapentin (NEURONTIN) 300 MG capsule Take 1-2 capsules (300-600 mg total) by mouth 3 (three) times daily. 180 capsule 0  . insulin degludec (TRESIBA FLEXTOUCH) 100 UNIT/ML SOPN FlexTouch Pen Inject 0.85 mLs (85 Units total) into the skin daily. And pen needles 1/day 10 pen 11  . LORazepam (ATIVAN) 0.5 MG tablet Take 0.5 mg by mouth as needed for anxiety.    . metoprolol-hydrochlorothiazide (LOPRESSOR HCT) 100-25 MG tablet Take 1 tablet by mouth daily.    . naproxen (NAPROSYN) 500 MG tablet Take 1 tablet with sumatriptan.  May repeat once with sumatriptan if needed. 16 tablet 2  . nortriptyline (PAMELOR) 50 MG capsule Take 1 capsule (50 mg total) by mouth at bedtime. (Patient taking differently: Take 25 mg by mouth at bedtime. ) 30 capsule 2  . PROAIR HFA 108 (90 Base) MCG/ACT inhaler INHALE TWO PUFFS BY MOUTH EVERY 4 HOURS AS NEEDED FOR  WHEEZING  OR  SHORTNESS  OF  BREATH 9 each 0  . rosuvastatin (CRESTOR) 40 MG tablet Take 1 tablet (40 mg total) by mouth daily. 90 tablet 3  .  Semaglutide (OZEMPIC) 0.25 or 0.5 MG/DOSE SOPN Inject 0.5 mg into the skin once a week. 4 pen 11  . traZODone (DESYREL) 100 MG tablet TAKE ONE TABLET BY MOUTH AT BEDTIME 30 tablet 11  . zolmitriptan (ZOMIG) 5 MG nasal solution Place 1 spray into the nose as needed for migraine. (Patient not taking: Reported on 06/12/2017) 6 Units 0   No current facility-administered medications on file prior to visit.     ALLERGIES: Allergies  Allergen Reactions  . Adhesive [Tape] Other (See Comments)    Blisters USE PAPER TAPE ONLY  . Sulfa Antibiotics Other (See Comments)    Burns the inside of my mouth; "blisters; leaves tongue and inside of mouth solid red"  . Decongestant [Pseudoephedrine Hcl Er] Itching    ALL DECONGESTANTS  . Prednisone Other (See Comments)    Gives me a bad attitude  .  Percocet [Oxycodone-Acetaminophen] Other (See Comments)    PT reports having nightmares when taking Percocet  . Zoloft [Sertraline Hcl]   . Latex Itching    FAMILY HISTORY: Family History  Problem Relation Age of Onset  . Breast cancer Unknown   . Diabetes Paternal Grandfather     SOCIAL HISTORY: Social History   Socioeconomic History  . Marital status: Single    Spouse name: Not on file  . Number of children: Not on file  . Years of education: Not on file  . Highest education level: Not on file  Social Needs  . Financial resource strain: Not on file  . Food insecurity - worry: Not on file  . Food insecurity - inability: Not on file  . Transportation needs - medical: Not on file  . Transportation needs - non-medical: Not on file  Occupational History  . Not on file  Tobacco Use  . Smoking status: Current Every Day Smoker    Packs/day: 1.00    Years: 29.00    Pack years: 29.00    Types: Cigarettes    Last attempt to quit: 03/05/2017    Years since quitting: 0.2  . Smokeless tobacco: Never Used  Substance and Sexual Activity  . Alcohol use: No    Comment: hasn't  had s drink in over 5 yrs  . Drug use: No  . Sexual activity: Yes  Other Topics Concern  . Not on file  Social History Narrative  . Not on file    REVIEW OF SYSTEMS: Constitutional: No fevers, chills, or sweats, no generalized fatigue, change in appetite Eyes: No visual changes, double vision, eye pain Ear, nose and throat: No hearing loss, ear pain, nasal congestion, sore throat Cardiovascular: No chest pain, palpitations Respiratory:  No shortness of breath at rest or with exertion, wheezes GastrointestinaI: No nausea, vomiting, diarrhea, abdominal pain, fecal incontinence Genitourinary:  No dysuria, urinary retention or frequency Musculoskeletal:  No neck pain, back pain Integumentary: No rash, pruritus, skin lesions Neurological: as above Psychiatric: No depression, insomnia, anxiety Endocrine:  No palpitations, fatigue, diaphoresis, mood swings, change in appetite, change in weight, increased thirst Hematologic/Lymphatic:  No purpura, petechiae. Allergic/Immunologic: no itchy/runny eyes, nasal congestion, recent allergic reactions, rashes  PHYSICAL EXAM: Vitals:   06/12/17 1401  BP: 130/80  Pulse: 92  SpO2: 97%   General: No acute distress.   Head:  Normocephalic/atraumatic Eyes:  Fundi examined but not visualized Neck: supple, no paraspinal tenderness, full range of motion Heart:  Regular rate and rhythm Lungs:  Clear to auscultation bilaterally Back: No paraspinal tenderness Neurological Exam: alert and oriented to  person, place, and time. Attention span and concentration intact, recent and remote memory intact, fund of knowledge intact.  Speech fluent and not dysarthric, language intact.  CN II-XII intact. Bulk and tone normal, muscle strength 5/5 throughout.  Sensation to light touch  intact.  Deep tendon reflexes 2+ throughout.  Finger to nose testing intact.  Gait normal, Romberg negative.  IMPRESSION: Migraine with aura Primary stabbing headache Chronic tension type headache Cerebrovascular disease Memory deficits Cigarette smoker  PLAN: 1.  Start topiramate ER 40m at bedtime 2.  For abortive therapy, will have her try Reglan 148m(advised not to take promethazine with it as it is also an anti-emetic) 3.  For memory, check TSH, B12 and refer for neuropsychological testing 4.  Advised to take ASA 8162maily 5.  Smoking cessation 6.  Follow up in 3 months.  AdaMetta ClinesO  CC:  MicPhilis FendtA-C

## 2017-06-18 ENCOUNTER — Telehealth: Payer: Self-pay

## 2017-06-18 NOTE — Telephone Encounter (Signed)
-----   Message from Drema DallasAdam R Jaffe, DO sent at 06/13/2017  9:20 AM EST ----- TSH and B12 are normal

## 2017-06-18 NOTE — Telephone Encounter (Signed)
Called and LM on Pts VM advisig TSH and B12 labs are normal, call with any questions

## 2017-07-01 ENCOUNTER — Other Ambulatory Visit: Payer: Self-pay | Admitting: Urgent Care

## 2017-07-01 DIAGNOSIS — Z794 Long term (current) use of insulin: Principal | ICD-10-CM

## 2017-07-01 DIAGNOSIS — E1142 Type 2 diabetes mellitus with diabetic polyneuropathy: Secondary | ICD-10-CM

## 2017-07-01 NOTE — Telephone Encounter (Signed)
Copied from CRM [phone]. Topic: Quick Communication - Rx Refill/Question >> Jul 01, 2017 11:38 AM Crist InfanteHarrald, Kathy J wrote: Medication: gabapentin (NEURONTIN) 300 MG capsule  Has the patient contacted their pharmacy? {yes Pt states she takes 2/ TID and only has 2 days left.  Pt states last time pharmacy never got the refill request back. [facility] [place] - [address] [phone] (Phone) [phone] (Fax)

## 2017-07-02 NOTE — Telephone Encounter (Signed)
neurontin 300 mg refill Last OV: 06/03/17 Last Refill:06/03/17 Pharmacy:[facility] [address]

## 2017-07-03 ENCOUNTER — Telehealth: Payer: Self-pay | Admitting: Physician Assistant

## 2017-07-03 NOTE — Telephone Encounter (Signed)
Copied from CRM [id number]. Topic: Quick Communication - Rx Refill/Question >> Jul 03, 2017 10:13 AM Everardo PacificMoton, Shellsea Borunda, [place] wrote: Medication: Gabapentin   Has the patient contacted their pharmacy?  Yes  Patient calling because she still needs her refill on her medication. Stated that she has taken her last pill this morning and she has been in contact with the pharmacy as well.  Preferred Pharmacy (with phone number or street name): [facility] [address] [phone]   Agent: Please be advised that RX refills may take up to 3 business days. We ask that you follow-up with your pharmacy.

## 2017-07-03 NOTE — Telephone Encounter (Signed)
Patient called to inform that she will need an appointment with Michael Tapia before any mDeliah Bostonore refills, I informed her that when gabapentin was filled in December, it was a courtesy refill and it was noted she will need to follow up with Kristie Tapia for more refills, she said "I don't have a ride unless it's on Saturdays. Can I make an appointment to see him on a Saturday? Can't he just fill it for me?" I advised I would send this request to the office. Patient's last OV 02/02/17 with Kristie Tapia.

## 2017-07-03 NOTE — Telephone Encounter (Signed)
Spoke with pt and made appt for Deliah BostonMichael Clark PA for 1:40pm Sat 2/2

## 2017-07-06 ENCOUNTER — Encounter: Payer: Self-pay | Admitting: Physician Assistant

## 2017-07-06 ENCOUNTER — Ambulatory Visit: Payer: BLUE CROSS/BLUE SHIELD | Admitting: Physician Assistant

## 2017-07-06 ENCOUNTER — Other Ambulatory Visit: Payer: Self-pay

## 2017-07-06 VITALS — BP 106/70 | HR 93 | Temp 97.5°F | Resp 16 | Ht 65.5 in | Wt 192.1 lb

## 2017-07-06 DIAGNOSIS — F17200 Nicotine dependence, unspecified, uncomplicated: Secondary | ICD-10-CM | POA: Diagnosis not present

## 2017-07-06 DIAGNOSIS — Z9189 Other specified personal risk factors, not elsewhere classified: Secondary | ICD-10-CM | POA: Diagnosis not present

## 2017-07-06 DIAGNOSIS — E1142 Type 2 diabetes mellitus with diabetic polyneuropathy: Secondary | ICD-10-CM | POA: Diagnosis not present

## 2017-07-06 DIAGNOSIS — J302 Other seasonal allergic rhinitis: Secondary | ICD-10-CM

## 2017-07-06 DIAGNOSIS — K219 Gastro-esophageal reflux disease without esophagitis: Secondary | ICD-10-CM | POA: Diagnosis not present

## 2017-07-06 DIAGNOSIS — Z794 Long term (current) use of insulin: Secondary | ICD-10-CM

## 2017-07-06 DIAGNOSIS — F419 Anxiety disorder, unspecified: Secondary | ICD-10-CM | POA: Diagnosis not present

## 2017-07-06 MED ORDER — LORAZEPAM 1 MG PO TABS: 1.0000 mg | ORAL_TABLET | Freq: Two times a day (BID) | ORAL | 2 refills | Status: DC | PRN

## 2017-07-06 MED ORDER — GABAPENTIN 300 MG PO CAPS: 600.0000 mg | ORAL_CAPSULE | Freq: Three times a day (TID) | ORAL | 1 refills | Status: DC

## 2017-07-06 MED ORDER — FLUTICASONE PROPIONATE 50 MCG/ACT NA SUSP: 2.0000 | Freq: Every day | NASAL | 6 refills | Status: DC

## 2017-07-06 MED ORDER — ESCITALOPRAM OXALATE 20 MG PO TABS: 20.0000 mg | ORAL_TABLET | Freq: Every day | ORAL | 3 refills | Status: DC

## 2017-07-06 MED ORDER — ESOMEPRAZOLE MAGNESIUM 40 MG PO CPDR: 40.0000 mg | DELAYED_RELEASE_CAPSULE | Freq: Every day | ORAL | 3 refills | Status: DC

## 2017-07-06 MED ORDER — TRAZODONE HCL 100 MG PO TABS: 100.0000 mg | ORAL_TABLET | Freq: Every day | ORAL | 11 refills | Status: DC

## 2017-07-06 MED ORDER — ASPIRIN EC 81 MG PO TBEC: 81.0000 mg | DELAYED_RELEASE_TABLET | Freq: Every day | ORAL | 0 refills | Status: DC

## 2017-07-06 NOTE — Patient Instructions (Signed)
COme back in three months.

## 2017-07-06 NOTE — Progress Notes (Signed)
07/07/2017 10:07 AM   DOB: 1970-11-26 / MRN: [id number]  SUBJECTIVE:  Kristie Tapia is a 47 y.o. female presenting for medication refills.  She has a history of uncontrolled diabetes associated with noncompliance.  This problem is managed by endocrinology.  She has a history of peripheral neuropathy secondary to diabetes and takes gabapentin for this.  Requesting refills today.  She complains that her pain is uncontrolled however is taking less gabapentin than I have prescribed.  She typically takes 2 twice daily and she is prescribed 3 twice daily.  She states she will move medication as prescribed.  She is not taking her aspirin despite my recommendation.  She tells me she will start this back.  She continues to smoke despite numerous recommendations by myself and Dr. Loanne Drilling to stop.  She sees neurology for history of migraine headaches and is now receiving injections.  She states this is helping her greatly.  She has a history of anxiety.  This is largely controlled with Lexapro 20 mg daily along with trazodone nightly.  She will at times have breakthrough anxiety and panic, mostly related to being out in public or if her husband is at home with her.  She tells me that she does not need more than 60 tabs of lorazepam yearly.  She tries to take this sparingly.  She is not prescribed narcotics.  She would like refills of her fluticasone today.  She has a long history of seasonal allergies.    Current Outpatient Medications:  .  albuterol (PROVENTIL HFA;VENTOLIN HFA) 108 (90 Base) MCG/ACT inhaler, Inhale 2 puffs into the lungs every 6 (six) hours as needed for wheezing or shortness of breath., Disp: 1 Inhaler, Rfl: 0 .  blood glucose meter kit and supplies, Dispense based on patient and insurance preference. Four times daily. Dx: E11.9, Disp: 1 each, Rfl: 0 .  Erenumab-aooe (AIMOVIG) 70 MG/ML SOAJ, Inject 70 mg into the skin every 30 (thirty) days., Disp: 1 pen, Rfl: 11 .  escitalopram  (LEXAPRO) 20 MG tablet, Take 1 tablet (20 mg total) by mouth daily., Disp: 90 tablet, Rfl: 3 .  esomeprazole (NEXIUM) 40 MG capsule, Take 1 capsule (40 mg total) by mouth daily. Take thirty minutes before breakfast in the morning., Disp: 90 capsule, Rfl: 3 .  gabapentin (NEURONTIN) 300 MG capsule, Take 2 capsules (600 mg total) by mouth 3 (three) times daily., Disp: 540 capsule, Rfl: 1 .  insulin degludec (TRESIBA FLEXTOUCH) 100 UNIT/ML SOPN FlexTouch Pen, Inject 0.85 mLs (85 Units total) into the skin daily. And pen needles 1/day, Disp: 10 pen, Rfl: 11 .  metoCLOPramide (REGLAN) 10 MG tablet, Take 1 tablet (10 mg total) by mouth every 6 (six) hours as needed for nausea., Disp: 25 tablet, Rfl: 2 .  nortriptyline (PAMELOR) 50 MG capsule, Take 1 capsule (50 mg total) by mouth at bedtime. (Patient taking differently: Take 25 mg by mouth at bedtime. ), Disp: 30 capsule, Rfl: 2 .  rosuvastatin (CRESTOR) 40 MG tablet, Take 1 tablet (40 mg total) by mouth daily., Disp: 90 tablet, Rfl: 3 .  Semaglutide (OZEMPIC) 0.25 or 0.5 MG/DOSE SOPN, Inject 0.5 mg into the skin once a week., Disp: 4 pen, Rfl: 11 .  Topiramate ER (QUDEXY XR) 50 MG CS24 sprinkle capsule, Take 1 capsule by mouth daily., Disp: 60 each, Rfl: 0 .  traZODone (DESYREL) 100 MG tablet, Take 1 tablet (100 mg total) by mouth at bedtime., Disp: 30 tablet, Rfl: 11 .  aspirin EC  81 MG tablet, Take 1 tablet (81 mg total) by mouth daily., Disp: 365 tablet, Rfl: 0 .  fluticasone (FLONASE) 50 MCG/ACT nasal spray, Place 2 sprays into both nostrils daily., Disp: 16 g, Rfl: 6 .  LORazepam (ATIVAN) 1 MG tablet, Take 1 tablet (1 mg total) by mouth 2 (two) times daily as needed for anxiety (For acute panic only. No more than 60 tabs in one year from 07/06/17)., Disp: 20 tablet, Rfl: 2   She is allergic to adhesive [tape]; sulfa antibiotics; decongestant [pseudoephedrine hcl er]; prednisone; percocet [oxycodone-acetaminophen]; zoloft [sertraline hcl]; and latex.    She  has a past medical history of Anemia, Anxiety, Complication of anesthesia (` 1998), Gallstones, GERD (gastroesophageal reflux disease), H/O hiatal hernia, History of stomach ulcers, Migraines, Sleep apnea, and Type II diabetes mellitus ([place]).    She  reports that she has been smoking cigarettes.  She has a 29.00 pack-year smoking history. she has never used smokeless tobacco. She reports that she does not drink alcohol or use drugs. She  reports that she currently engages in sexual activity. The patient  has a past surgical history that includes Appendectomy (01/03/2012); Abdominal hysterectomy (08/1998); Dilation and curettage of uterus; Abdominal exploration surgery; Tubal ligation (05/1998); LEEP (1998); and laparoscopic appendectomy (01/03/2012).  Her family history includes Breast cancer in her unknown relative; Diabetes in her paternal grandfather.  Review of Systems  Constitutional: Negative for chills, diaphoresis and fever.  Eyes: Negative.   Respiratory: Negative for cough, hemoptysis, sputum production, shortness of breath and wheezing.   Cardiovascular: Negative for chest pain, orthopnea and leg swelling.  Gastrointestinal: Negative for abdominal pain, blood in stool, constipation, diarrhea, heartburn, melena, nausea and vomiting.  Genitourinary: Negative for dysuria, flank pain, frequency, hematuria and urgency.  Skin: Negative for rash.  Neurological: Negative for dizziness, sensory change, speech change, focal weakness and headaches.    The problem list and medications were reviewed and updated by myself where necessary and exist elsewhere in the encounter.   OBJECTIVE:  BP 106/70   Pulse 93   Temp (!) 97.5 F (36.4 C) (Oral)   Resp 16   Ht 5' 5.5" (1.664 m)   Wt 192 lb 2 oz (87.1 kg)   SpO2 96%   BMI 31.49 kg/m     Physical Exam  Constitutional: She is active.  Non-toxic appearance.  Cardiovascular: Normal rate, regular rhythm, S1 normal, S2 normal, normal heart  sounds and intact distal pulses. Exam reveals no gallop, no friction rub and no decreased pulses.  No murmur heard. Pulmonary/Chest: Effort normal. No stridor. No tachypnea. No respiratory distress. She has no wheezes. She has no rales.  Abdominal: She exhibits no distension.  Musculoskeletal: She exhibits no edema.  Neurological: She is alert.  Skin: Skin is warm and dry. She is not diaphoretic. No pallor.     Results for orders placed or performed in visit on 07/06/17 (from the past 72 hour(s))  Renal Function Panel     Status: Abnormal   Collection Time: 07/06/17  2:42 PM  Result Value Ref Range   Glucose 141 (H) 65 - 99 mg/dL   BUN 12 6 - 24 mg/dL   Creatinine, Ser 0.88 0.57 - 1.00 mg/dL   GFR calc non Af Amer 79 >59 mL/min/1.73   GFR calc Af Amer 91 >59 mL/min/1.73   BUN/Creatinine Ratio 14 9 - 23   Sodium 137 134 - 144 mmol/L   Potassium 4.4 3.5 - 5.2 mmol/L   Chloride 100  96 - 106 mmol/L   CO2 21 20 - 29 mmol/L   Calcium 8.7 8.7 - 10.2 mg/dL   Phosphorus 3.8 2.5 - 4.5 mg/dL   Albumin 4.4 3.5 - 5.5 g/dL  Lipid Panel     Status: Abnormal   Collection Time: 07/06/17  2:42 PM  Result Value Ref Range   Cholesterol, Total 96 (L) 100 - 199 mg/dL   Triglycerides 114 0 - 149 mg/dL   HDL 39 (L) >39 mg/dL   VLDL Cholesterol Cal 23 5 - 40 mg/dL   LDL Calculated 34 0 - 99 mg/dL   Chol/HDL Ratio 2.5 0.0 - 4.4 ratio    Comment:                                   T. Chol/HDL Ratio                                             Men  Women                               1/2 Avg.Risk  3.4    3.3                                   Avg.Risk  5.0    4.4                                2X Avg.Risk  9.6    7.1                                3X Avg.Risk 23.4   11.0   CBC     Status: Abnormal   Collection Time: 07/06/17  2:42 PM  Result Value Ref Range   WBC 10.0 3.4 - 10.8 x10E3/uL   RBC 5.53 (H) 3.77 - 5.28 x10E6/uL   Hemoglobin 17.0 (H) 11.1 - 15.9 g/dL   Hematocrit 48.7 (H) 34.0 - 46.6 %     MCV 88 79 - 97 fL   MCH 30.7 26.6 - 33.0 pg   MCHC 34.9 31.5 - 35.7 g/dL   RDW 15.6 (H) 12.3 - 15.4 %   Platelets 396 (H) 150 - 379 x10E3/uL   Lab Results  Component Value Date   HGBA1C 11.9 05/21/2017       No results found.    ASSESSMENT AND PLAN:  Kristie Tapia was seen today for medication refill.  Diagnoses and all orders for this visit:  At risk for acute ischemic cardiac event: I greatly appreciate the care of her specialist.  Using the ASCVD calculator, her most current lipid panel, today's blood pressure, her smoking status her ASCVD is 3.2.  I will continue to encourage her to stop smoking.  Fortunately she has seen [facility] and had a nonischemic stress test.  She self discharged her metoprolol.  She will start back on her aspirin. -     aspirin EC 81 MG tablet; Take 1 tablet (81 mg total) by mouth daily. -     Lipid Panel -  Care order/instruction: -     rosuvastatin (CRESTOR) 40 MG tablet; Take 1 tablet (40 mg total) by mouth daily.  Type 2 diabetes mellitus with diabetic polyneuropathy, with long-term current use of insulin (HCC) -     gabapentin (NEURONTIN) 300 MG capsule; Take 2 capsules (600 mg total) by mouth 3 (three) times daily. -     Renal Function Panel -     CBC -     rosuvastatin (CRESTOR) 40 MG tablet; Take 1 tablet (40 mg total) by mouth daily.  Chronic GERD: Controlled with Nexium.  We will continue this. -     esomeprazole (NEXIUM) 40 MG capsule; Take 1 capsule (40 mg total) by mouth daily. Take thirty minutes before breakfast in the morning.  Chronic anxiety: Continue on her current medications.  I will provide her with 60 Ativan for the next 365 days.  She agrees that she will not request more of this medication.  Fortunately she is not prescribed narcotics. -     LORazepam (ATIVAN) 1 MG tablet; Take 1 tablet (1 mg total) by mouth 2 (two) times daily as needed for anxiety (For acute panic only. No more than 60 tabs in one year from  07/06/17). -     escitalopram (LEXAPRO) 20 MG tablet; Take 1 tablet (20 mg total) by mouth daily. -     traZODone (DESYREL) 100 MG tablet; Take 1 tablet (100 mg total) by mouth at bedtime.  Seasonal allergies -     fluticasone (FLONASE) 50 MCG/ACT nasal spray; Place 2 sprays into both nostrils daily.  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  We will continue to monitor. -     rosuvastatin (CRESTOR) 40 MG tablet; Take 1 tablet (40 mg total) by mouth daily.    The patient is advised to call or return to clinic if she does not see an improvement in symptoms, or to seek the care of the closest emergency department if she worsens with the above plan.   Philis Fendt, MHS, PA-C [facility] 07/07/2017 10:07 AM

## 2017-07-07 DIAGNOSIS — F419 Anxiety disorder, unspecified: Secondary | ICD-10-CM | POA: Insufficient documentation

## 2017-07-07 DIAGNOSIS — F17200 Nicotine dependence, unspecified, uncomplicated: Secondary | ICD-10-CM | POA: Insufficient documentation

## 2017-07-07 DIAGNOSIS — K219 Gastro-esophageal reflux disease without esophagitis: Secondary | ICD-10-CM | POA: Insufficient documentation

## 2017-07-07 LAB — RENAL FUNCTION PANEL
ALBUMIN: 4.4 g/dL (ref 3.5–5.5)
BUN/Creatinine Ratio: 14 (ref 9–23)
BUN: 12 mg/dL (ref 6–24)
CHLORIDE: 100 mmol/L (ref 96–106)
CO2: 21 mmol/L (ref 20–29)
CREATININE: 0.88 mg/dL (ref 0.57–1.00)
Calcium: 8.7 mg/dL (ref 8.7–10.2)
GFR, EST AFRICAN AMERICAN: 91 mL/min/{1.73_m2} (ref >59)
GFR, EST NON AFRICAN AMERICAN: 79 mL/min/{1.73_m2} (ref >59)
Glucose: 141 mg/dL — ABNORMAL HIGH (ref 65–99)
Phosphorus: 3.8 mg/dL (ref 2.5–4.5)
Potassium: 4.4 mmol/L (ref 3.5–5.2)
Sodium: 137 mmol/L (ref 134–144)

## 2017-07-07 LAB — CBC
Hematocrit: 48.7 % — ABNORMAL HIGH (ref 34.0–46.6)
Hemoglobin: 17 g/dL — ABNORMAL HIGH (ref 11.1–15.9)
MCH: 30.7 pg (ref 26.6–33.0)
MCHC: 34.9 g/dL (ref 31.5–35.7)
MCV: 88 fL (ref 79–97)
PLATELETS: 396 10*3/uL — AB (ref 150–379)
RBC: 5.53 x10E6/uL — ABNORMAL HIGH (ref 3.77–5.28)
RDW: 15.6 % — AB (ref 12.3–15.4)
WBC: 10 10*3/uL (ref 3.4–10.8)

## 2017-07-07 LAB — LIPID PANEL
CHOL/HDL RATIO: 2.5 ratio (ref 0.0–4.4)
Cholesterol, Total: 96 mg/dL — ABNORMAL LOW (ref 100–199)
HDL: 39 mg/dL — AB (ref >39)
LDL Calculated: 34 mg/dL (ref 0–99)
Triglycerides: 114 mg/dL (ref 0–149)
VLDL CHOLESTEROL CAL: 23 mg/dL (ref 5–40)

## 2017-07-07 MED ORDER — ROSUVASTATIN CALCIUM 40 MG PO TABS: 40.0000 mg | ORAL_TABLET | Freq: Every day | ORAL | 3 refills | Status: DC

## 2017-07-19 ENCOUNTER — Ambulatory Visit: Payer: BLUE CROSS/BLUE SHIELD | Admitting: Endocrinology

## 2017-07-19 ENCOUNTER — Encounter: Payer: Self-pay | Admitting: Endocrinology

## 2017-07-19 VITALS — BP 142/82 | HR 89 | Wt 192.6 lb

## 2017-07-19 DIAGNOSIS — Z794 Long term (current) use of insulin: Secondary | ICD-10-CM

## 2017-07-19 DIAGNOSIS — E1142 Type 2 diabetes mellitus with diabetic polyneuropathy: Secondary | ICD-10-CM

## 2017-07-19 LAB — POCT GLYCOSYLATED HEMOGLOBIN (HGB A1C): HEMOGLOBIN A1C: 7.5

## 2017-07-19 MED ORDER — SEMAGLUTIDE (1 MG/DOSE) 2 MG/1.5ML ~~LOC~~ SOPN: 1.0000 mg | PEN_INJECTOR | SUBCUTANEOUS | 11 refills | Status: DC

## 2017-07-19 MED ORDER — INSULIN DEGLUDEC 100 UNIT/ML ~~LOC~~ SOPN: 70.0000 [IU] | PEN_INJECTOR | Freq: Every day | SUBCUTANEOUS | 11 refills | Status: DC

## 2017-07-19 NOTE — Progress Notes (Signed)
Subjective:    Patient ID: Kristie Tapia, female    DOB: 06-06-1970, 47 y.o.   MRN: [id number]  HPI Pt returns for f/u of diabetes mellitus: DM type: Insulin-requiring type 2.  Dx'ed: 0355 Complications: polyneuropathy Therapy: insulin since 2001, and ozempic GDM: never DKA: never Severe hypoglycemia: never Pancreatitis: never Pancreatic imaging: normal on 2017 CT Other: she is not a candidate for multiple daily injections, due to noncompliance. Interval history: She takes tresiba, 45 units qd, and reg, 20 units qd.  She does not check cbg's.  Main symptoms are generalized body pain, chronic nausea, and weight gain Past Medical History:  Diagnosis Date  . Anemia   . Anxiety   . Complication of anesthesia ` 1998   "lungs collapsed during LEEP procedure"  . Gallstones   . GERD (gastroesophageal reflux disease)   . H/O hiatal hernia   . History of stomach ulcers   . Migraines   . Sleep apnea   . Type II diabetes mellitus ([place])     Past Surgical History:  Procedure Laterality Date  . ABDOMINAL EXPLORATION SURGERY     "I've had several"  . ABDOMINAL HYSTERECTOMY  08/1998  . APPENDECTOMY  01/03/2012  . DILATION AND CURETTAGE OF UTERUS    . LAPAROSCOPIC APPENDECTOMY  01/03/2012   Procedure: APPENDECTOMY LAPAROSCOPIC;  Surgeon: Edward Jolly, MD;  Location: [facility];  Service: General;  Laterality: N/A;  . LEEP  1998   "lungs collapsed"  . TUBAL LIGATION  05/1998    Social History   Socioeconomic History  . Marital status: Single    Spouse name: Not on file  . Number of children: Not on file  . Years of education: Not on file  . Highest education level: Not on file  Social Needs  . Financial resource strain: Not on file  . Food insecurity - worry: Not on file  . Food insecurity - inability: Not on file  . Transportation needs - medical: Not on file  . Transportation needs - non-medical: Not on file  Occupational History  . Not on file  Tobacco Use  . Smoking  status: Current Every Day Smoker    Packs/day: 1.00    Years: 29.00    Pack years: 29.00    Types: Cigarettes    Last attempt to quit: 03/05/2017    Years since quitting: 0.3  . Smokeless tobacco: Never Used  Substance and Sexual Activity  . Alcohol use: No    Comment: hasn't  had s drink in over 5 yrs  . Drug use: No  . Sexual activity: Yes  Other Topics Concern  . Not on file  Social History Narrative  . Not on file    Current Outpatient Medications on File Prior to Visit  Medication Sig Dispense Refill  . albuterol (PROVENTIL HFA;VENTOLIN HFA) 108 (90 Base) MCG/ACT inhaler Inhale 2 puffs into the lungs every 6 (six) hours as needed for wheezing or shortness of breath. 1 Inhaler 0  . aspirin EC 81 MG tablet Take 1 tablet (81 mg total) by mouth daily. 365 tablet 0  . blood glucose meter kit and supplies Dispense based on patient and insurance preference. Four times daily. Dx: E11.9 1 each 0  . Erenumab-aooe (AIMOVIG) 70 MG/ML SOAJ Inject 70 mg into the skin every 30 (thirty) days. 1 pen 11  . escitalopram (LEXAPRO) 20 MG tablet Take 1 tablet (20 mg total) by mouth daily. 90 tablet 3  . esomeprazole (NEXIUM) 40 MG capsule  Take 1 capsule (40 mg total) by mouth daily. Take thirty minutes before breakfast in the morning. 90 capsule 3  . fluticasone (FLONASE) 50 MCG/ACT nasal spray Place 2 sprays into both nostrils daily. 16 g 6  . gabapentin (NEURONTIN) 300 MG capsule Take 2 capsules (600 mg total) by mouth 3 (three) times daily. 540 capsule 1  . LORazepam (ATIVAN) 1 MG tablet Take 1 tablet (1 mg total) by mouth 2 (two) times daily as needed for anxiety (For acute panic only. No more than 60 tabs in one year from 07/06/17). 20 tablet 2  . metoCLOPramide (REGLAN) 10 MG tablet Take 1 tablet (10 mg total) by mouth every 6 (six) hours as needed for nausea. 25 tablet 2  . nortriptyline (PAMELOR) 50 MG capsule Take 1 capsule (50 mg total) by mouth at bedtime. (Patient taking differently: Take 25  mg by mouth at bedtime. ) 30 capsule 2  . rosuvastatin (CRESTOR) 40 MG tablet Take 1 tablet (40 mg total) by mouth daily. 90 tablet 3  . Topiramate ER (QUDEXY XR) 50 MG CS24 sprinkle capsule Take 1 capsule by mouth daily. 60 each 0  . traZODone (DESYREL) 100 MG tablet Take 1 tablet (100 mg total) by mouth at bedtime. 30 tablet 11   No current facility-administered medications on file prior to visit.     Allergies  Allergen Reactions  . Adhesive [Tape] Other (See Comments)    Blisters USE PAPER TAPE ONLY  . Sulfa Antibiotics Other (See Comments)    Burns the inside of my mouth; "blisters; leaves tongue and inside of mouth solid red"  . Decongestant [Pseudoephedrine Hcl Er] Itching    ALL DECONGESTANTS  . Prednisone Other (See Comments)    Gives me a bad attitude  . Percocet [Oxycodone-Acetaminophen] Other (See Comments)    PT reports having nightmares when taking Percocet  . Zoloft [Sertraline Hcl]   . Latex Itching    Family History  Problem Relation Age of Onset  . Breast cancer Unknown   . Diabetes Paternal Grandfather     BP (!) 142/82 (BP Location: Left Arm, Patient Position: Sitting, Cuff Size: Normal)   Pulse 89   Wt 192 lb 9.6 oz (87.4 kg)   SpO2 95%   BMI 31.56 kg/m    Review of Systems She denies hypoglycemia sxs.      Objective:   Physical Exam VITAL SIGNS:  See vs page GENERAL: no distress Pulses: foot pulses are intact bilaterally.   MSK: no deformity of the feet or ankles.  CV: trace bilat edema of the legs.  Skin:  no ulcer on the feet or ankles.  normal color and temp on the feet and ankles.   Neuro: sensation is intact to touch on the feet and ankles, but decreased from normal.     Lab Results  Component Value Date   HGBA1C 7.5 07/19/2017       Assessment & Plan:  Insulin-requiring type 2 DM, with polyneuropathy: this is the best control this pt should aim for, given this regimen, which does match insulin to her changing needs throughout the  day Obesity: worse: pt requests increased emphasis on ozempic.    Patient Instructions  Your blood pressure is high today.  Please see your primary care provider soon, to have it rechecked. Please increase the ozempic and reduce the tresiba.  I have sent prescriptions to your pharmacy. Please come back for a follow-up appointment in 3 months check your blood sugar twice a  day.  vary the time of day when you check, between before the 3 meals, and at bedtime.  also check if you have symptoms of your blood sugar being too high or too low.  please keep a record of the readings and bring it to your next appointment here (or you can bring the meter itself).  You can write it on any piece of paper.  please call us sooner if your blood sugar goes below 70, or if you have a lot of readings over 200.

## 2017-07-19 NOTE — Patient Instructions (Addendum)
Your blood pressure is high today.  Please see your primary care provider soon, to have it rechecked. Please increase the ozempic and reduce the tresiba.  I have sent prescriptions to your pharmacy. Please come back for a follow-up appointment in 3 months check your blood sugar twice a day.  vary the time of day when you check, between before the 3 meals, and at bedtime.  also check if you have symptoms of your blood sugar being too high or too low.  please keep a record of the readings and bring it to your next appointment here (or you can bring the meter itself).  You can write it on any piece of paper.  please call us sooner if your blood sugar goes below 70, or if you have a lot of readings over 200.

## 2017-08-01 ENCOUNTER — Telehealth: Payer: Self-pay | Admitting: Neurology

## 2017-08-01 NOTE — Telephone Encounter (Signed)
Tiara called from cover my meds needing to get Prior Auth on Aimovig medication. Please Call with Reference # [id number]. Thanks

## 2017-08-02 NOTE — Telephone Encounter (Signed)
Morrie Sheldonshley- you had looked in to this one. Please review.

## 2017-08-02 NOTE — Telephone Encounter (Signed)
I will check on status of PA.

## 2017-08-05 ENCOUNTER — Telehealth: Payer: Self-pay | Admitting: Neurology

## 2017-08-05 ENCOUNTER — Other Ambulatory Visit: Payer: Self-pay

## 2017-08-05 MED ORDER — TOPIRAMATE ER 50 MG PO SPRINKLE CAP24: 1.0000 | EXTENDED_RELEASE_CAPSULE | Freq: Every day | ORAL | 3 refills | Status: DC

## 2017-08-05 NOTE — Telephone Encounter (Signed)
Rx sent #60 with 3 refills.

## 2017-08-05 NOTE — Telephone Encounter (Signed)
Patient needs a RX for the queexy we gave her samples of the medication. She would like it called into the [facility] on [address]. Also the injection she takes monthly needs to have a prior [place]

## 2017-08-07 ENCOUNTER — Telehealth: Payer: Self-pay

## 2017-08-07 NOTE — Telephone Encounter (Signed)
Initiated  prior authorization for [employer] on Cover My Meds

## 2017-08-13 NOTE — Telephone Encounter (Signed)
Your request has been approved  Effective from 08/08/2017 through 08/06/2020

## 2017-08-13 NOTE — Telephone Encounter (Signed)
Prior authorization for Qudexy approved.  Sent the information to [employer]

## 2017-08-20 ENCOUNTER — Other Ambulatory Visit: Payer: Self-pay | Admitting: Gastroenterology

## 2017-08-20 DIAGNOSIS — R1084 Generalized abdominal pain: Secondary | ICD-10-CM

## 2017-08-23 ENCOUNTER — Other Ambulatory Visit (HOSPITAL_COMMUNITY): Payer: Self-pay | Admitting: Gastroenterology

## 2017-08-23 DIAGNOSIS — R1310 Dysphagia, unspecified: Secondary | ICD-10-CM

## 2017-08-26 ENCOUNTER — Ambulatory Visit (HOSPITAL_COMMUNITY)
Admission: RE | Admit: 2017-08-26 | Discharge: 2017-08-26 | Disposition: A | Discharge status: Home / Self Care | Payer: BLUE CROSS/BLUE SHIELD | Source: Ambulatory Visit | Attending: Gastroenterology | Admitting: Gastroenterology

## 2017-08-26 DIAGNOSIS — G4730 Sleep apnea, unspecified: Secondary | ICD-10-CM | POA: Insufficient documentation

## 2017-08-26 DIAGNOSIS — R1310 Dysphagia, unspecified: Secondary | ICD-10-CM | POA: Diagnosis present

## 2017-08-26 DIAGNOSIS — K219 Gastro-esophageal reflux disease without esophagitis: Secondary | ICD-10-CM | POA: Insufficient documentation

## 2017-08-26 DIAGNOSIS — G9389 Other specified disorders of brain: Secondary | ICD-10-CM | POA: Insufficient documentation

## 2017-08-26 DIAGNOSIS — Z8673 Personal history of transient ischemic attack (TIA), and cerebral infarction without residual deficits: Secondary | ICD-10-CM | POA: Insufficient documentation

## 2017-08-26 DIAGNOSIS — F419 Anxiety disorder, unspecified: Secondary | ICD-10-CM | POA: Insufficient documentation

## 2017-08-26 DIAGNOSIS — E119 Type 2 diabetes mellitus without complications: Secondary | ICD-10-CM | POA: Insufficient documentation

## 2017-08-26 DIAGNOSIS — E785 Hyperlipidemia, unspecified: Secondary | ICD-10-CM | POA: Diagnosis not present

## 2017-09-02 ENCOUNTER — Ambulatory Visit
Admission: RE | Admit: 2017-09-02 | Discharge: 2017-09-02 | Disposition: A | Discharge status: Home / Self Care | Payer: BLUE CROSS/BLUE SHIELD | Source: Ambulatory Visit | Attending: Gastroenterology | Admitting: Gastroenterology

## 2017-09-02 DIAGNOSIS — R1084 Generalized abdominal pain: Secondary | ICD-10-CM

## 2017-09-02 MED ORDER — IOPAMIDOL (ISOVUE-300) INJECTION 61%
100.0000 mL | Freq: Once | INTRAVENOUS | Status: AC | PRN
  Administered 2017-09-02: 100 mL via INTRAVENOUS

## 2017-09-04 ENCOUNTER — Telehealth: Payer: Self-pay | Admitting: Endocrinology

## 2017-09-04 NOTE — Telephone Encounter (Signed)
Patient stated that her doctor said he thinks her medication Ozempic is causing her stomach pain.  She also reduced her trulicity medication from 70 mg to 50 mg because her b/s was to high, now b/s is now is in the 150 range. Please advise

## 2017-09-05 NOTE — Telephone Encounter (Signed)
Do you want trulicity to stay reduced or go back to the 70 mg?

## 2017-09-05 NOTE — Telephone Encounter (Signed)
Why don't we reduce ozempic to half as much?  Ok with you?

## 2017-09-13 ENCOUNTER — Ambulatory Visit: Payer: BLUE CROSS/BLUE SHIELD | Admitting: Neurology

## 2017-09-13 ENCOUNTER — Encounter: Payer: Self-pay | Admitting: Neurology

## 2017-09-13 VITALS — BP 142/90 | HR 81 | Wt 194.0 lb

## 2017-09-13 DIAGNOSIS — F17200 Nicotine dependence, unspecified, uncomplicated: Secondary | ICD-10-CM

## 2017-09-13 DIAGNOSIS — G44221 Chronic tension-type headache, intractable: Secondary | ICD-10-CM

## 2017-09-13 DIAGNOSIS — G43109 Migraine with aura, not intractable, without status migrainosus: Secondary | ICD-10-CM

## 2017-09-13 MED ORDER — TOPIRAMATE ER 25 MG PO SPRINKLE CAP24: 25.0000 mg | EXTENDED_RELEASE_CAPSULE | Freq: Every day | ORAL | 0 refills | Status: DC

## 2017-09-13 MED ORDER — TOPIRAMATE ER 25 MG PO SPRINKLE CAP24: 75.0000 mg | EXTENDED_RELEASE_CAPSULE | Freq: Every day | ORAL | 2 refills | Status: DC

## 2017-09-13 MED ORDER — TOPIRAMATE ER 50 MG PO SPRINKLE CAP24: 50.0000 mg | EXTENDED_RELEASE_CAPSULE | Freq: Every day | ORAL | 0 refills | Status: DC

## 2017-09-13 NOTE — Progress Notes (Signed)
NEUROLOGY FOLLOW UP OFFICE NOTE  Kristie Tapia [id number]  HISTORY OF PRESENT ILLNESS: Kristie Tapia is a 47 year old female with type 2 diabetes, hypertension, tobacco use disorder, fibromyalgia, and anxiety who follows up for migraine.   UPDATE: Last visit, she was started on topiramate ER 63m.  She stopped Aimovig because she never received a refill.  I Migraine: Intensity:  7-8/10 Duration:  All day Frequency:  1 migraine in past 2 months.   II  Primary Stabbing Headache Intensity:  Severe Duration:  20 seconds Frequency:  Once a month   III Bi-temporal headache/tension type: Intensity:  Moderate Duration:  All day Frequency:  Kristie Tapia  Frequency of abortive medication: almost daily Current NSAIDS:  ASA 850mdaily Current analgesics:  Excedrin Migraine and Tension Current triptans:  no Current anti-emetic:  promethazine 2577mReglan (rarely) Current muscle relaxants:  no Current anti-anxiolytic:  no Current sleep aide:  trazodone Current Antihypertensive medications:  no Current Antidepressant medications:  Lexapro 53m80mrrent Anticonvulsant medications:  Topiramate ER 50mg28mbapentin 900mg 58me daily Current anti-CGRP:  Aimovig Current Vitamins/Herbal/Supplements:  Omega 3 Current Antihistamines/Decongestants:  Flonase Other therapy:  No  To evaluate memory, she underwent testing.  B12 was 442 and TSH was 3.14.  She is scheduled to start neuropsychological testing next month.    Caffeine:  Dr. PepperMalachi Bondst tea Smoker:  yes Diet:  Dr. PepperMalachi Bondst tea.  No water. Exercise:  no Depression/anxiety:  anxiety Sleep hygiene:  Poor   HISTORY:  Onset:  6th grade I  Migraines: Location:  Bi-temporal/parietal and travels to occiput and back of neck Quality:  pressure Initial Intensity:  7-8/10 Aura:  Squiggly lines in vision of one or both eyes for 30 minutes prior to onset of headache and then resolves. Prodrome:  no Associated symptoms:  Nausea,  photophobia, phonopohobia.  She has not had any new worse headache of her life, waking up from sleep Initial Duration:  All day (used to respond to sumatriptan 50mg i17mhour) Initial Frequency:  Varies.  Some months it may be daily, sometimes once a week Initial Frequency of abortive medication: 1 to 2 days a week Triggers/exacerbating factors:  stress Relieving factors:  sleep Activity:  Aggravates   II  She also has primary stabbing headaches that last 20 seconds III  She also has bilateral temporal headaches as well.   Past NSAIDS:  ibuprofen, naproxen, Toradol injection (ineffective), Cambia (unable to tolerate) Past analgesics:  tramadol, oxycodone Past abortive triptans:  sumatriptan 100mg, M10mt 5mg (con42mindicated due to MRI brain evidence of ischemia. Past muscle relaxants:  Robaxin, tizanidine, Skelaxin Past anti-emetic:  Zofran Past antihypertensive medications:  no Past antidepressant medications:  Nortriptyline 25mg (sid65mfects) Past anticonvulsant medications:  topiramate (helped in conjunction with other medications that she can't remember) Past vitamins/Herbal/Supplements:  no Past antihistamines/decongestants:  no Other past therapies:  no   Family history of headache:  no  Memory:  Since 2018, she reports worsening memory.  She frequently forgets why she walked into a room.  [facility]  [place] what she wanted to say during conversation.  She denies family history of dementia.   To assess right sided mandibular pain, an X-ray of the mandible was performed on 07/14/16 and revealed significant dental disease in the right mandibular molars.   She had an MRI of the brain without contrast on 05/01/13 at an outside facility  that demonstrated an ovoid area of hyperattenuation on T2 weighted images within the right thalamus measuring less than 5 mm in diameter, as well as punctate are of signal  hyperintensity within the right superior temporal lobe at the gray-white junction and a punctate hyperintense focus and a linear juxtacortical lesion within the right frontal lobe concerning for demyelination.  Follow up MRI with contrast from 05/09/13 revealed no enhancement.  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  Based on these findings, we discontinued triptans.  PAST MEDICAL HISTORY: Past Medical History:  Diagnosis Date  . Anemia   . Anxiety   . Complication of anesthesia ` 1998   "lungs collapsed during LEEP procedure"  . Gallstones   . GERD (gastroesophageal reflux disease)   . H/O hiatal hernia   . History of stomach ulcers   . Migraines   . Sleep apnea   . Type II diabetes mellitus (HCC)     MEDICATIONS: Current Outpatient Medications on File Prior to Visit  Medication Sig Dispense Refill  . aspirin EC 81 MG tablet Take 1 tablet (81 mg total) by mouth daily. 365 tablet 0  . blood glucose meter kit and supplies Dispense based on patient and insurance preference. Four times daily. Dx: E11.9 1 each 0  . escitalopram (LEXAPRO) 20 MG tablet Take 1 tablet (20 mg total) by mouth daily. 90 tablet 3  . fluticasone (FLONASE) 50 MCG/ACT nasal spray Place 2 sprays into both nostrils daily. 16 g 6  . gabapentin (NEURONTIN) 300 MG capsule Take 2 capsules (600 mg total) by mouth 3 (three) times daily. 540 capsule 1  . insulin degludec (TRESIBA FLEXTOUCH) 100 UNIT/ML SOPN  FlexTouch Pen Inject 0.7 mLs (70 Units total) into the skin daily. And pen needles 1/day 10 pen 11  . LORazepam (ATIVAN) 1 MG tablet Take 1 tablet (1 mg total) by mouth 2 (two) times daily as needed for anxiety (For acute panic only. No more than 60 tabs in one year from 07/06/17). 20 tablet 2  . metoCLOPramide (REGLAN) 10 MG tablet Take 1 tablet (10 mg total) by mouth every 6 (six) hours as needed for nausea. 25 tablet 2  . rosuvastatin (CRESTOR) 40 MG tablet Take 1 tablet (40 mg total) by mouth daily. 90 tablet 3  . traZODone (DESYREL) 100 MG tablet Take 1 tablet (100 mg total) by mouth at bedtime. 30 tablet 11  . albuterol (PROVENTIL HFA;VENTOLIN HFA) 108 (90 Base) MCG/ACT inhaler Inhale 2 puffs into the lungs every 6 (six) hours as needed for wheezing or shortness of breath. (Patient not taking: Reported on 09/13/2017) 1 Inhaler 0  . Erenumab-aooe (AIMOVIG) 70 MG/ML SOAJ Inject 70 mg into the skin every 30 (thirty) days. (Patient not taking: Reported on 09/13/2017) 1 pen 11  . esomeprazole (NEXIUM) 40 MG capsule Take 1 capsule (40 mg total) by mouth daily. Take thirty minutes before breakfast in the morning. (Patient not taking: Reported on 09/13/2017) 90 capsule 3  . nortriptyline (PAMELOR) 50 MG  capsule Take 1 capsule (50 mg total) by mouth at bedtime. (Patient not taking: Reported on 09/13/2017) 30 capsule 2  . pantoprazole (PROTONIX) 40 MG tablet   1  . promethazine (PHENERGAN) 25 MG tablet   0  . Semaglutide (OZEMPIC) 1 MG/DOSE SOPN Inject 1 mg into the skin once a week. (Patient not taking: Reported on 09/13/2017) 4 pen 11   No current facility-administered medications on file prior to visit.     ALLERGIES: Allergies  Allergen Reactions  . Adhesive [Tape] Other (See Comments)    Blisters USE PAPER TAPE ONLY  . Sulfa Antibiotics Other (See Comments)    Burns the inside of my mouth; "blisters; leaves tongue and inside of mouth solid red"  . Decongestant [Pseudoephedrine Hcl Er] Itching     ALL DECONGESTANTS  . Prednisone Other (See Comments)    Gives me a bad attitude  . Percocet [Oxycodone-Acetaminophen] Other (See Comments)    PT reports having nightmares when taking Percocet  . Zoloft [Sertraline Hcl]   . Latex Itching    FAMILY HISTORY: Family History  Problem Relation Age of Onset  . Breast cancer Unknown   . Diabetes Paternal Grandfather     SOCIAL HISTORY: Social History   Socioeconomic History  . Marital status: Married    Spouse name: Not on file  . Number of children: Not on file  . Years of education: Not on file  . Highest education level: Not on file  Occupational History  . Not on file  Social Needs  . Financial resource strain: Not on file  . Food insecurity:    Worry: Not on file    Inability: Not on file  . Transportation needs:    Medical: Not on file    Non-medical: Not on file  Tobacco Use  . Smoking status: Current Every Day Smoker    Packs/day: 1.00    Years: 29.00    Pack years: 29.00    Types: Cigarettes    Last attempt to quit: 03/05/2017    Years since quitting: 0.5  . Smokeless tobacco: Never Used  Substance and Sexual Activity  . Alcohol use: No    Comment: hasn't  had s drink in over 5 yrs  . Drug use: No  . Sexual activity: Yes  Lifestyle  . Physical activity:    Days per week: Not on file    Minutes per session: Not on file  . Stress: Not on file  Relationships  . Social connections:    Talks on phone: Not on file    Gets together: Not on file    Attends religious service: Not on file    Active member of club or organization: Not on file    Attends meetings of clubs or organizations: Not on file    Relationship status: Not on file  . Intimate partner violence:    Fear of current or ex partner: Not on file    Emotionally abused: Not on file    Physically abused: Not on file    Forced sexual activity: Not on file  Other Topics Concern  . Not on file  Social History Narrative  . Not on file    REVIEW OF  SYSTEMS: Constitutional: No fevers, chills, or sweats, no generalized fatigue, change in appetite Eyes: No visual changes, double vision, eye pain Ear, nose and throat: No hearing loss, ear pain, nasal congestion, sore throat Cardiovascular: No chest pain, palpitations Respiratory:  No shortness of breath at rest  or with exertion, wheezes GastrointestinaI: No nausea, vomiting, diarrhea, abdominal pain, fecal incontinence Genitourinary:  No dysuria, urinary retention or frequency Musculoskeletal:  No neck pain, back pain Integumentary: No rash, pruritus, skin lesions Neurological: as above Psychiatric: No depression, insomnia, anxiety Endocrine: No palpitations, fatigue, diaphoresis, mood swings, change in appetite, change in weight, increased thirst Hematologic/Lymphatic:  No purpura, petechiae. Allergic/Immunologic: no itchy/runny eyes, nasal congestion, recent allergic reactions, rashes  PHYSICAL EXAM: Vitals:   09/13/17 1420  BP: (!) 142/90  Pulse: 81   General: No acute distress.   Head:  Normocephalic/atraumatic Eyes:  Fundi examined but not visualized Neck: supple, no paraspinal tenderness, full range of motion Heart:  Regular rate and rhythm Lungs:  Clear to auscultation bilaterally Back: No paraspinal tenderness Neurological Exam: alert and oriented to person, place, and time. Attention span and concentration intact, recent and remote memory intact, fund of knowledge intact.  Speech fluent and not dysarthric, language intact.  CN II-XII intact. Bulk and tone normal, muscle strength 5/5 throughout.  Sensation to light touch  intact.  Deep tendon reflexes 2+ throughout.  Finger to nose testing intact.  Gait normal, Romberg negative.  IMPRESSION: Migraine with aura, not intractable Chronic tension type headache complicated by medication overuse Primary stabbing headache  PLAN: 1.  Increase topiramate ER to 93m at bedtime.  We can increase dose in 4 weeks if needed. 2.   Limit use of pain relievers to no more than 2 days out of week. 3.  Keep headache diary 4.  Follow up with PCP regarding blood pressure 5.  Tobacco cessation counseling (CPT 99406):  Tobacco use with history of CAD (HTN, hyperlipidemia) and cerebrovascular disease.  - Currently smoking 1 packs/day   - Patient was informed of the dangers of tobacco abuse including stroke, cancer, and MI, as well as benefits of tobacco cessation. - Patient is not willing to quit at this time. - Approximately 5 mins were spent counseling patient cessation techniques. We discussed various methods to help quit smoking, including deciding on a date to quit, joining a support group, pharmacological agents- nicotine gum/patch/lozenges, chantix.  - I will reassess her progress at the next follow-up visit 6.  Follow up in 3 months.   AMetta Clines DO  CC: MPhilis Fendt PA-C

## 2017-09-13 NOTE — Patient Instructions (Signed)
1.  We will increase Qudexy to 75mg  at bedtime.  Take three 25mg  pills at bedtime.  Contact me in 4 weeks if headache not better and we can increase dose. 2.  Limit use of pain relievers to no more than 2 days out of week 3.  Keep headache diary 4.  Try to quit smoking 5.  Continue aspirin 81mg  daily 6.  Follow up in 3 months.

## 2017-09-30 ENCOUNTER — Encounter: Payer: Self-pay | Admitting: Neurology

## 2017-10-12 ENCOUNTER — Ambulatory Visit: Payer: BLUE CROSS/BLUE SHIELD | Admitting: Physician Assistant

## 2017-10-12 ENCOUNTER — Encounter: Payer: Self-pay | Admitting: Physician Assistant

## 2017-10-12 ENCOUNTER — Other Ambulatory Visit: Payer: Self-pay

## 2017-10-12 VITALS — BP 118/76 | HR 72 | Temp 98.0°F | Resp 16 | Ht 65.5 in | Wt 191.0 lb

## 2017-10-12 DIAGNOSIS — Z9189 Other specified personal risk factors, not elsewhere classified: Secondary | ICD-10-CM

## 2017-10-12 DIAGNOSIS — F17200 Nicotine dependence, unspecified, uncomplicated: Secondary | ICD-10-CM | POA: Diagnosis not present

## 2017-10-12 DIAGNOSIS — Z794 Long term (current) use of insulin: Secondary | ICD-10-CM | POA: Diagnosis not present

## 2017-10-12 DIAGNOSIS — R2243 Localized swelling, mass and lump, lower limb, bilateral: Secondary | ICD-10-CM | POA: Diagnosis not present

## 2017-10-12 DIAGNOSIS — E1159 Type 2 diabetes mellitus with other circulatory complications: Secondary | ICD-10-CM

## 2017-10-12 MED ORDER — NICOTINE POLACRILEX 2 MG MT GUM: 2.0000 mg | CHEWING_GUM | OROMUCOSAL | 11 refills | Status: DC | PRN

## 2017-10-12 MED ORDER — FUROSEMIDE 20 MG PO TABS: 10.0000 mg | ORAL_TABLET | Freq: Every day | ORAL | 0 refills | Status: DC

## 2017-10-12 NOTE — Progress Notes (Signed)
10/12/2017 2:37 PM   DOB: [birth year] / MRN: [id number]  SUBJECTIVE:  Kristie Tapia is a 47 y.o. female presenting for medication recheck.  Her neurologist has advised that she has some areas of ischemia and this worries her.  She denies weakness today, gait disturbance, dizziness, vision changes and HA today.  She has a history of poorly controlled DM2 sees Dr. Everardo All in endo. She will self adjust her medications.  She has stopped ozempic due to GI distress.  She has lowered her insulin from 70 to 40 daily and tells me that Dr. Jeanne Ivan is aware of this.  She sees [facility] for increased ASCVD risk and stopped taking ACE/ARB because it made her BP go up.  She continues to smoke a pack a day. She complains of swelling in her lower legs that gets worse as the day goes on.  She denies chest pain, SOB, orthopnea and calf pain.    She is allergic to adhesive [tape]; sulfa antibiotics; decongestant [pseudoephedrine hcl er]; prednisone; percocet [oxycodone-acetaminophen]; zoloft [sertraline hcl]; and latex.   She  has a past medical history of Anemia, Anxiety, Complication of anesthesia (` 1998), Gallstones, GERD (gastroesophageal reflux disease), H/O hiatal hernia, History of stomach ulcers, Migraines, Sleep apnea, and Type II diabetes mellitus (HCC).    She  reports that she has been smoking cigarettes.  She has a 29.00 pack-year smoking history. She has never used smokeless tobacco. She reports that she does not drink alcohol or use drugs. She  reports that she currently engages in sexual activity. The patient  has a past surgical history that includes Appendectomy (01/03/2012); Abdominal hysterectomy (08/1998); Dilation and curettage of uterus; Abdominal exploration surgery; Tubal ligation (05/1998); LEEP (1998); and laparoscopic appendectomy (01/03/2012).  Her family history includes Breast cancer in her unknown relative; Diabetes in her paternal grandfather.  Review of Systems    Constitutional: Negative for diaphoresis.  Eyes: Negative.   Respiratory: Negative for cough, hemoptysis, sputum production, shortness of breath and wheezing.   Cardiovascular: Negative for chest pain, orthopnea and leg swelling.  Gastrointestinal: Negative for abdominal pain, blood in stool, constipation, diarrhea, heartburn, melena, nausea and vomiting.  Genitourinary: Negative for dysuria, flank pain, frequency, hematuria and urgency.  Neurological: Negative for dizziness, sensory change, speech change, focal weakness and headaches.    The problem list and medications were reviewed and updated by myself where necessary and exist elsewhere in the encounter.   OBJECTIVE:  BP 118/76 (BP Location: Right Arm)   Pulse 72   Temp 98 F (36.7 C) (Oral)   Resp 16   Ht 5' 5.5" (1.664 m)   Wt 191 lb (86.6 kg)   SpO2 96%   BMI 31.30 kg/m   BP Readings from Last 3 Encounters:  10/12/17 118/76  09/13/17 (!) 142/90  07/19/17 (!) 142/82   Wt Readings from Last 3 Encounters:  10/12/17 191 lb (86.6 kg)  09/13/17 194 lb (88 kg)  07/19/17 192 lb 9.6 oz (87.4 kg)    Physical Exam  Constitutional: She is oriented to person, place, and time. She appears well-developed and well-nourished. No distress.  Eyes: Pupils are equal, round, and reactive to light. EOM are normal.  Cardiovascular: Normal rate, regular rhythm, S1 normal, S2 normal, normal heart sounds and intact distal pulses. Exam reveals no gallop, no friction rub and no decreased pulses.  No murmur heard. Pulses:      Dorsalis pedis pulses are 2+ on the right side, and 2+ on the left  side.       Posterior tibial pulses are 2+ on the right side, and 2+ on the left side.  Pulmonary/Chest: Effort normal. No stridor. No respiratory distress. She has no wheezes. She has no rales.  Abdominal: She exhibits no distension.  Musculoskeletal: Normal range of motion. She exhibits edema (1+ bilateral). She exhibits no tenderness or deformity.   Feet:  Right Foot:  Skin Integrity: Negative for ulcer, blister, skin breakdown, erythema, callus or dry skin.  Left Foot:  Skin Integrity: Negative for ulcer, blister, skin breakdown, erythema, callus or dry skin.  Neurological: She is alert and oriented to person, place, and time. No cranial nerve deficit. Gait normal.  Skin: Skin is warm and dry. She is not diaphoretic.  Psychiatric: She has a normal mood and affect.  Vitals reviewed.   Lab Results  Component Value Date   HGBA1C 7.5 07/19/2017   No results found for this or any previous visit (from the past 72 hour(s)).  No results found.  ASSESSMENT AND PLAN:  Rhyen was seen today for acute ischemic cardiac.  Diagnoses and all orders for this visit:  At risk for acute ischemic cardiac event: I have asked her follow the advise of her specialist closely.  She continues to adjust her medications.    Type 2 diabetes mellitus with other circulatory complication, with long-term current use of insulin (HCC) -     Creatinine, serum -     Potassium  Smoker unmotivated to quit -     nicotine polacrilex (NICORELIEF) 2 MG gum; Take 1 each (2 mg total) by mouth as needed for smoking cessation.  Localized swelling of both lower legs -     furosemide (LASIX) 20 MG tablet; Take 0.5 tablets (10 mg total) by mouth daily. For relief of swelling. -     CBC    The patient is advised to call or return to clinic if she does not see an improvement in symptoms, or to seek the care of the closest emergency department if she worsens with the above plan.   Deliah Boston, MHS, PA-C [facility] at [facility] 10/12/2017 2:37 PM

## 2017-10-12 NOTE — Patient Instructions (Addendum)
Please follow Dr. George Hugh, Dr. Nadara Eaton, and Dr. Moises Blood instructions closely.   Take lasix 10 mg daily as needed for swelling.   Please continue taking crestor and aspirin.   Please use the gum to stop smoking.    Come back in about 3 months.   Call if you need refills of your medications.     IF you received an x-ray today, you will receive an invoice from [facility]. Please contact [facility] at [phone] with questions or concerns regarding your invoice.   IF you received labwork today, you will receive an invoice from [facility]. Please contact [facility] at [phone] with questions or concerns regarding your invoice.   Our billing staff will not be able to assist you with questions regarding bills from these companies.  You will be contacted with the lab results as soon as they are available. The fastest way to get your results is to activate your My Chart account. Instructions are located on the last page of this paperwork. If you have not heard from [place] regarding the results in 2 weeks, please contact this office.

## 2017-10-13 LAB — CREATININE, SERUM
Creatinine, Ser: 0.8 mg/dL (ref 0.57–1.00)
GFR calc non Af Amer: 89 mL/min/{1.73_m2} (ref >59)
GFR, EST AFRICAN AMERICAN: 102 mL/min/{1.73_m2} (ref >59)

## 2017-10-13 LAB — CBC
HEMOGLOBIN: 16.3 g/dL — AB (ref 11.1–15.9)
Hematocrit: 49.5 % — ABNORMAL HIGH (ref 34.0–46.6)
MCH: 28.7 pg (ref 26.6–33.0)
MCHC: 32.9 g/dL (ref 31.5–35.7)
MCV: 87 fL (ref 79–97)
PLATELETS: 372 10*3/uL (ref 150–379)
RBC: 5.67 x10E6/uL — ABNORMAL HIGH (ref 3.77–5.28)
RDW: 14.9 % (ref 12.3–15.4)
WBC: 10.2 10*3/uL (ref 3.4–10.8)

## 2017-10-13 LAB — POTASSIUM: Potassium: 4.9 mmol/L (ref 3.5–5.2)

## 2017-10-18 ENCOUNTER — Ambulatory Visit: Payer: BLUE CROSS/BLUE SHIELD | Admitting: Endocrinology

## 2017-10-18 ENCOUNTER — Encounter: Payer: Self-pay | Admitting: Endocrinology

## 2017-10-18 VITALS — BP 120/72 | HR 87 | Wt 190.6 lb

## 2017-10-18 DIAGNOSIS — Z794 Long term (current) use of insulin: Secondary | ICD-10-CM

## 2017-10-18 DIAGNOSIS — E1142 Type 2 diabetes mellitus with diabetic polyneuropathy: Secondary | ICD-10-CM | POA: Diagnosis not present

## 2017-10-18 LAB — POCT GLYCOSYLATED HEMOGLOBIN (HGB A1C): Hemoglobin A1C: 9.8

## 2017-10-18 NOTE — Progress Notes (Signed)
Subjective:    Patient ID: Kristie Tapia, female    DOB: 1970/07/15, 47 y.o.   MRN: [id number]  HPI Pt returns for f/u of diabetes mellitus: DM type: Insulin-requiring type 2.  Dx'ed: 5277 Complications: polyneuropathy Therapy: insulin since 2001, and ozempic GDM: never DKA: never Severe hypoglycemia: never Pancreatitis: never Pancreatic imaging: normal on 2017 CT Other: she is not a candidate for multiple daily injections, due to noncompliance.  Interval history: She takes tresiba, 45 units qd, and reg, 20 units qd.  She does not check cbg's.  Due to afternoon hypoglycemia, she reduced tresiba to 40/d, and stopped reg insulin.  She stopped ozempic, due to nausea.   Past Medical History:  Diagnosis Date  . Anemia   . Anxiety   . Complication of anesthesia ` 1998   "lungs collapsed during LEEP procedure"  . Gallstones   . GERD (gastroesophageal reflux disease)   . H/O hiatal hernia   . History of stomach ulcers   . Migraines   . Sleep apnea   . Type II diabetes mellitus ([place])     Past Surgical History:  Procedure Laterality Date  . ABDOMINAL EXPLORATION SURGERY     "I've had several"  . ABDOMINAL HYSTERECTOMY  08/1998  . APPENDECTOMY  01/03/2012  . DILATION AND CURETTAGE OF UTERUS    . LAPAROSCOPIC APPENDECTOMY  01/03/2012   Procedure: APPENDECTOMY LAPAROSCOPIC;  Surgeon: Edward Jolly, MD;  Location: [place] [facility];  Service: General;  Laterality: N/A;  . LEEP  1998   "lungs collapsed"  . TUBAL LIGATION  05/1998    Social History   Socioeconomic History  . Marital status: Married    Spouse name: Not on file  . Number of children: Not on file  . Years of education: Not on file  . Highest education level: Not on file  Occupational History  . Not on file  Social Needs  . Financial resource strain: Not on file  . Food insecurity:    Worry: Not on file    Inability: Not on file  . Transportation needs:    Medical: Not on file    Non-medical: Not on file   Tobacco Use  . Smoking status: Current Every Day Smoker    Packs/day: 1.00    Years: 29.00    Pack years: 29.00    Types: Cigarettes    Last attempt to quit: 03/05/2017    Years since quitting: 0.6  . Smokeless tobacco: Never Used  Substance and Sexual Activity  . Alcohol use: No    Comment: hasn't  had s drink in over 5 yrs  . Drug use: No  . Sexual activity: Yes  Lifestyle  . Physical activity:    Days per week: Not on file    Minutes per session: Not on file  . Stress: Not on file  Relationships  . Social connections:    Talks on phone: Not on file    Gets together: Not on file    Attends religious service: Not on file    Active member of club or organization: Not on file    Attends meetings of clubs or organizations: Not on file    Relationship status: Not on file  . Intimate partner violence:    Fear of current or ex partner: Not on file    Emotionally abused: Not on file    Physically abused: Not on file    Forced sexual activity: Not on file  Other Topics Concern  .  Not on file  Social History Narrative  . Not on file    Current Outpatient Medications on File Prior to Visit  Medication Sig Dispense Refill  . albuterol (PROVENTIL HFA;VENTOLIN HFA) 108 (90 Base) MCG/ACT inhaler Inhale 2 puffs into the lungs every 6 (six) hours as needed for wheezing or shortness of breath. 1 Inhaler 0  . aspirin EC 81 MG tablet Take 1 tablet (81 mg total) by mouth daily. 365 tablet 0  . blood glucose meter kit and supplies Dispense based on patient and insurance preference. Four times daily. Dx: E11.9 1 each 0  . escitalopram (LEXAPRO) 20 MG tablet Take 1 tablet (20 mg total) by mouth daily. 90 tablet 3  . fluticasone (FLONASE) 50 MCG/ACT nasal spray Place 2 sprays into both nostrils daily. 16 g 6  . furosemide (LASIX) 20 MG tablet Take 0.5 tablets (10 mg total) by mouth daily. For relief of swelling. 90 tablet 0  . gabapentin (NEURONTIN) 300 MG capsule Take 2 capsules (600 mg  total) by mouth 3 (three) times daily. 540 capsule 1  . insulin degludec (TRESIBA FLEXTOUCH) 100 UNIT/ML SOPN FlexTouch Pen Inject 0.7 mLs (70 Units total) into the skin daily. And pen needles 1/day 10 pen 11  . LORazepam (ATIVAN) 1 MG tablet Take 1 tablet (1 mg total) by mouth 2 (two) times daily as needed for anxiety (For acute panic only. No more than 60 tabs in one year from 07/06/17). 20 tablet 2  . metoCLOPramide (REGLAN) 10 MG tablet Take 1 tablet (10 mg total) by mouth every 6 (six) hours as needed for nausea. 25 tablet 2  . nicotine polacrilex (NICORELIEF) 2 MG gum Take 1 each (2 mg total) by mouth as needed for smoking cessation. 100 tablet 11  . nortriptyline (PAMELOR) 50 MG capsule Take 1 capsule (50 mg total) by mouth at bedtime. 30 capsule 2  . pantoprazole (PROTONIX) 40 MG tablet   1  . promethazine (PHENERGAN) 25 MG tablet   0  . rosuvastatin (CRESTOR) 40 MG tablet Take 1 tablet (40 mg total) by mouth daily. 90 tablet 3  . topiramate ER (QUDEXY XR) 50 MG CS24 sprinkle capsule Take 1 capsule (50 mg total) by mouth daily. 30 each 0  . topiramate ER (QUDEXY XR) CS24 sprinkle cap Take 3 capsules (75 mg total) by mouth daily. 90 each 2  . topiramate ER (QUDEXY XR) CS24 sprinkle cap Take 1 capsule (25 mg total) by mouth daily. 30 each 0  . traZODone (DESYREL) 100 MG tablet Take 1 tablet (100 mg total) by mouth at bedtime. 30 tablet 11  . Erenumab-aooe (AIMOVIG) 70 MG/ML SOAJ Inject 70 mg into the skin every 30 (thirty) days. (Patient not taking: Reported on 10/18/2017) 1 pen 11   No current facility-administered medications on file prior to visit.     Allergies  Allergen Reactions  . Adhesive [Tape] Other (See Comments)    Blisters USE PAPER TAPE ONLY  . Sulfa Antibiotics Other (See Comments)    Burns the inside of my mouth; "blisters; leaves tongue and inside of mouth solid red"  . Decongestant [Pseudoephedrine Hcl Er] Itching    ALL DECONGESTANTS  . Prednisone Other (See Comments)     Gives me a bad attitude  . Percocet [Oxycodone-Acetaminophen] Other (See Comments)    PT reports having nightmares when taking Percocet  . Zoloft [Sertraline Hcl]   . Latex Itching    Family History  Problem Relation Age of Onset  . Breast  cancer Unknown   . Diabetes Paternal Grandfather     BP 120/72   Pulse 87   Wt 190 lb 9.6 oz (86.5 kg)   SpO2 94%   BMI 31.24 kg/m   Review of Systems Denies LOC    Objective:   Physical Exam VITAL SIGNS:  See vs page GENERAL: no distress Pulses: dorsalis pedis intact bilat.   MSK: no deformity of the feet CV: no leg edema Skin:  no ulcer on the feet.  normal color and temp on the feet. Neuro: sensation is intact to touch on the feet   Lab Results  Component Value Date   HGBA1C 9.8 10/18/2017       Assessment & Plan:  Insulin-requiring type 2 DM, with polyneuropathy: worse.  Noncompliance with cbg recording: we discussed  Patient Instructions  Please see Vaughan Basta, to consider a "V-GO" device.   Please come back for a follow-up appointment in 2 months.   check your blood sugar twice a day.  vary the time of day when you check, between before the 3 meals, and at bedtime.  also check if you have symptoms of your blood sugar being too high or too low.  please keep a record of the readings and bring it to your next appointment here (or you can bring the meter itself).  You can write it on any piece of paper.  please call us sooner if your blood sugar goes below 70, or if you have a lot of readings over 200.

## 2017-10-18 NOTE — Patient Instructions (Addendum)
Please see Bonita Quin, to consider a "V-GO" device.   Please come back for a follow-up appointment in 2 months.   check your blood sugar twice a day.  vary the time of day when you check, between before the 3 meals, and at bedtime.  also check if you have symptoms of your blood sugar being too high or too low.  please keep a record of the readings and bring it to your next appointment here (or you can bring the meter itself).  You can write it on any piece of paper.  please call us sooner if your blood sugar goes below 70, or if you have a lot of readings over 200.

## 2017-10-22 ENCOUNTER — Ambulatory Visit (INDEPENDENT_AMBULATORY_CARE_PROVIDER_SITE_OTHER): Payer: BLUE CROSS/BLUE SHIELD | Admitting: Psychology

## 2017-10-22 ENCOUNTER — Encounter: Payer: Self-pay | Admitting: Psychology

## 2017-10-22 DIAGNOSIS — R413 Other amnesia: Secondary | ICD-10-CM

## 2017-10-22 NOTE — Progress Notes (Signed)
NEUROBEHAVIORAL STATUS EXAM   Name: Kristie Tapia Date of Birth: 14-Jul-1970 Date of Interview: 10/22/2017  Reason for Referral:  Kristie Tapia is a 47 y.o. female who is referred for neuropsychological evaluation by Dr. Metta Clines of [facility] due to concerns about memory loss. This patient is unaccompanied in the office for today's appointment.  History of Presenting Problem:  Kristie Tapia has a history of hypertension, uncontrolled type 2 diabetes (A1c was 9.8 this month), fibromyalgia, anxiety disorder, and tobacco use disorder (smokes 1 ppd). She also is followed by Dr. Tomi Likens for migraine and headache. She has reported worsening memory since 2018. She has no family history of dementia including early onset dementia. She had an MRI of the brain without contrast on 05/01/13 at an outside facility that demonstrated an ovoid area of hyperattenuation on T2 weighted images within the right thalamus measuring less than 5 mm in diameter, as well as punctate are of signal hyperintensity within the right superior temporal lobe at the gray-white junction and a punctate hyperintense focus and a linear juxtacortical lesion within the right frontal lobe concerning for demyelination.  Follow up MRI with contrast from 05/09/13 revealed no enhancement.  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  Repeat MRI of brain with and without contrast from 04/02/17 reportedly revealed focal areas of post-ischemic encephalomalacia in the high right frontal lobe (previously thought to be a cyst) and posterior right temporal and occipital lobe, as well as remote right lacunar infarct in right thalamus.  Based on these findings, triptans were discontinued. She was started on topiramate in January 2019 and  continues to take this.   The patient continues to experiencing gradually worsening memory. She reported difficulty retrieving overlearned information such as her current age. She will walk in a room and forget why she went in there. This happens 5 or 6 times a day in the same room for the same thing. She will put something in the microwave and forget about it, and then later go to heat something else up and find it in there. She will leave things on the stove; she has even left the house before while something was cooking on the stove, because she forgot about it. She will forget what she is saying in the middle of a sentence. She forgets to complete tasks like running specific errands.  Upon direct questioning, the patient/caregiver reported:   Forgetting recent conversations/events: Yes, she has forgotten that she went somewhere the day before Repeating statements/questions: Yes Misplacing/losing items: Yes Forgetting appointments or other obligations: No, she writes them on several calendars  Forgetting to take medications: No, she uses pill planners, but does have problem remembering what day of the week it is Difficulty concentrating: Yes Starting but not finishing tasks: Yes Distracted easily: Yes Word-finding difficulty: Yes Getting lost when driving: No Making wrong turns when driving: No Uncertain about directions when driving or passenger: No. "I go to very few places."  The patient is unemployed, seeking disability. She last worked in October 2016 for [employer]. She had a foot injury at work. 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 She manages all complex ADLs including driving, medications, finances, appointments and cooking.   When asked about management of her diabetes, she stated, "I eat things I  shouldn't considering I'm diabetic." She does not wish to stop drinking soda or quit smoking cigarettes. She does not check her blood sugars regularly. She frequently forgets to eat and  then gets dizzy, shaky and irritable with increased anxiety. When she does check her blood sugars, they are low.  She still has migraines and headaches regularly/daily. She sometimes has "ice pick headaches" which only last for a minute but are excruciating. In the past few weeks, she has had a new type of sensation in the frontal part of her head characterized by constant vibrating/tingling sensation. She reports she has hit her head many times in the past but never lost consciousness or had a concussion.  She has insomnia with difficulty falling asleep. She can be exhausted and still not fall asleep. She never feels rested upon awakening. She always feels tired. She has been tested for sleep apnea several times; she states that twice she was told she had it but then most recently she was told she did not.  She reports a history of anxiety and also two prior depressive episodes (one in the context of taking Chantix and resolved after discontinuing the medication; one while she was going through a very stressful period). She denies any history of suicidal ideation or intention. She reports feeling unappreciated but she does enjoy spending time with her animals.   She denied any history of illicit substance use or dependence. She does not drink any alcohol.  Of note, she reported that she has recent onset of visual illusions wherein she sees "blotches" on walls and, in low light areas, it appears to her as though she is looking through a screen. She has had two recent diabetic eye exams and was told by her eye doctor that the symptoms is not due to a problem with her eyes, and could be neurologic in nature.    Social History: Born/Raised: [place] Education: Some college Occupational history: Previously worked for [employer]. Unemployed, seeking disability. Has not worked since 2016 when she had a foot injury on the job. Marital history: Currently married. Divorced from two prior marriages.  Children: 3  daughters, ages 40, 43, 44 Alcohol: None in over 6 years, but was never dependent on it/never been an alcoholic Tobacco: 1 ppd SA: Denies   Medical History: Past Medical History:  Diagnosis Date  . Anemia   . Anxiety   . Complication of anesthesia ` 1998   "lungs collapsed during LEEP procedure"  . Gallstones   . GERD (gastroesophageal reflux disease)   . H/O hiatal hernia   . History of stomach ulcers   . Migraines   . Sleep apnea   . Type II diabetes mellitus ([facility])      Current Medications:  Outpatient Encounter Medications as of 10/22/2017  Medication Sig  . albuterol (PROVENTIL HFA;VENTOLIN HFA) 108 (90 Base) MCG/ACT inhaler Inhale 2 puffs into the lungs every 6 (six) hours as needed for wheezing or shortness of breath.  Marland Kitchen aspirin EC 81 MG tablet Take 1 tablet (81 mg total) by mouth daily.  . blood glucose meter kit and supplies Dispense based on patient and insurance preference. Four times daily. Dx: E11.9  . Erenumab-aooe (AIMOVIG) 70 MG/ML SOAJ Inject 70 mg into the skin every 30 (thirty) days. (Patient not taking: Reported on 10/18/2017)  . escitalopram (LEXAPRO) 20 MG tablet Take 1 tablet (20 mg total) by mouth daily.  . fluticasone (FLONASE) 50 MCG/ACT nasal spray Place 2 sprays into both nostrils daily.  Marland Kitchen  furosemide (LASIX) 20 MG tablet Take 0.5 tablets (10 mg total) by mouth daily. For relief of swelling.  . gabapentin (NEURONTIN) 300 MG capsule Take 2 capsules (600 mg total) by mouth 3 (three) times daily.  . insulin degludec (TRESIBA FLEXTOUCH) 100 UNIT/ML SOPN FlexTouch Pen Inject 0.7 mLs (70 Units total) into the skin daily. And pen needles 1/day  . LORazepam (ATIVAN) 1 MG tablet Take 1 tablet (1 mg total) by mouth 2 (two) times daily as needed for anxiety (For acute panic only. No more than 60 tabs in one year from 07/06/17).  . metoCLOPramide (REGLAN) 10 MG tablet Take 1 tablet (10 mg total) by mouth every 6 (six) hours as needed for nausea.  . nicotine polacrilex  (NICORELIEF) 2 MG gum Take 1 each (2 mg total) by mouth as needed for smoking cessation.  . nortriptyline (PAMELOR) 50 MG capsule Take 1 capsule (50 mg total) by mouth at bedtime.  . pantoprazole (PROTONIX) 40 MG tablet   . promethazine (PHENERGAN) 25 MG tablet   . rosuvastatin (CRESTOR) 40 MG tablet Take 1 tablet (40 mg total) by mouth daily.  Marland Kitchen topiramate ER (QUDEXY XR) 50 MG CS24 sprinkle capsule Take 1 capsule (50 mg total) by mouth daily.  Marland Kitchen topiramate ER (QUDEXY XR) CS24 sprinkle cap Take 3 capsules (75 mg total) by mouth daily.  Marland Kitchen topiramate ER (QUDEXY XR) CS24 sprinkle cap Take 1 capsule (25 mg total) by mouth daily.  . traZODone (DESYREL) 100 MG tablet Take 1 tablet (100 mg total) by mouth at bedtime.   No facility-administered encounter medications on file as of 10/22/2017.      Behavioral Observations:   Appearance: Casually dressed, mildly disheveled  Gait: Ambulated independently, no gross abnormalities observed Speech: Fluent; normal rate, rhythm and volume. Minimal word finding difficulty. Thought process: Generally linear, goal directed Affect: Full, appropriate Interpersonal: Pleasant, mildly disinhibited    55 minutes spent face-to-face with patient completing neurobehavioral status exam. 40 minutes spent integrating medical records/clinical data and completing this report. CPT codes: [id number] unit; [id number] unit.   TESTING: There is medical necessity to proceed with neuropsychological assessment as the results will be used to aid in differential diagnosis and clinical decision-making and to inform specific treatment recommendations. Per the patient and medical records reviewed, there has been a change in cognitive functioning along with abnormalities on brain MRI, and a reasonable suspicion of neurocognitive disorder (rule out vascular cognitive impairment).  Clinical Decision Making: In considering the patient's current level of functioning, level of presumed  impairment, nature of symptoms, emotional and behavioral responses during the interview, level of literacy, and observed level of motivation, a battery of tests was selected and communicated to the psychometrician.    PLAN: The patient will return to complete the above referenced full battery of neuropsychological testing with a psychometrician under my supervision. Education regarding testing procedures was provided to the patient. Subsequently, the patient will see this provider for a follow-up session at which time her test performances and my impressions and treatment recommendations will be reviewed in detail.  Evaluation ongoing; full report to follow.

## 2017-10-24 ENCOUNTER — Encounter: Payer: Self-pay | Admitting: Family Medicine

## 2017-10-29 ENCOUNTER — Telehealth: Payer: Self-pay | Admitting: Endocrinology

## 2017-10-29 ENCOUNTER — Other Ambulatory Visit: Payer: Self-pay

## 2017-10-29 NOTE — Telephone Encounter (Signed)
Patient stated that she would try the 86 but feels its making her feel bad. Her blood sugar is now 137, but that's because she took the left over novolog she had.

## 2017-10-29 NOTE — Telephone Encounter (Signed)
Patient stated that dr has her taking 70 units of TRESIBA.  Every since she started taking the 70 units she has been sick. With nausea and stating her head feels funny   She stated her blood sugars having been running as high as 557 and feels bad She would like to know if there is something else she can do ,.   Please advise

## 2017-10-29 NOTE — Telephone Encounter (Signed)
Please increase tresiba to 80 units qd. Please call if this does not help

## 2017-10-29 NOTE — Telephone Encounter (Signed)
Please try increasing the tresiba. It does work.  It is a matter of getting the right amount.   There are many possible causes of your symptoms, so please call or message [place] next week, to tell us how the blood sugar is doing

## 2017-10-29 NOTE — Telephone Encounter (Signed)
Patient stated that she is nauseated & dizzy. She has been taking phenergan to function. She also had an old prescription for novolog that she was taking to get her blood sugar down, but it as low as it's gotten was 167. Patient also said that [place] did not spike her like this last time, but she can't function this way.   Blood Sugars: Today: 310, [id number]  Yesterday: 363, 336, 338, 167 (with 10-20 units of novolog)  5/26: 231,182,   5/25: 316, 557

## 2017-10-29 NOTE — Telephone Encounter (Signed)
Is your blood sugar staying high, or is it low sometimes? Do you want to change to a different insulin?

## 2017-10-30 ENCOUNTER — Ambulatory Visit: Payer: BLUE CROSS/BLUE SHIELD | Admitting: Psychology

## 2017-10-30 ENCOUNTER — Encounter: Payer: Self-pay | Admitting: Psychology

## 2017-10-30 DIAGNOSIS — R413 Other amnesia: Secondary | ICD-10-CM

## 2017-10-30 NOTE — Progress Notes (Signed)
   Neuropsychology Note  Alasia Enge completed 120 minutes of neuropsychological testing with technician, Wallace Keller, BS, under the supervision of Dr. Elvis Coil, Licensed Psychologist. The patient did not appear overtly distressed by the testing session, per behavioral observation or via self-report to the technician. Rest breaks were offered.   Clinical Decision Making: In considering the patient's current level of functioning, level of presumed impairment, nature of symptoms, emotional and behavioral responses during the interview, level of literacy, and observed level of motivation/effort, a battery of tests was selected and communicated to the psychometrician.  Communication between the psychologist and technician was ongoing throughout the testing session and changes were made as deemed necessary based on patient performance on testing, technician observations and additional pertinent factors such as those listed above.  Graylee Seefeld will return within approximately 2 weeks for an interactive feedback session with Dr. Alinda Dooms at which time her test performances, clinical impressions and treatment recommendations will be reviewed in detail. The patient understands she can contact our office should she require our assistance before this time.  35 minutes spent performing neuropsychological evaluation services/clinical decision making (psychologist). [CPT 96132] 120 minutes spent face-to-face with patient administering standardized tests, 30 minutes spent scoring (technician). [CPT [id number], 96139]  Full report to follow.

## 2017-11-04 ENCOUNTER — Other Ambulatory Visit: Payer: Self-pay | Admitting: Urgent Care

## 2017-11-04 ENCOUNTER — Telehealth: Payer: Self-pay | Admitting: Endocrinology

## 2017-11-04 ENCOUNTER — Ambulatory Visit (INDEPENDENT_AMBULATORY_CARE_PROVIDER_SITE_OTHER): Payer: BLUE CROSS/BLUE SHIELD | Admitting: Endocrinology

## 2017-11-04 ENCOUNTER — Encounter: Payer: Self-pay | Admitting: Endocrinology

## 2017-11-04 VITALS — BP 132/64 | HR 64 | Ht 65.5 in | Wt 188.0 lb

## 2017-11-04 DIAGNOSIS — E1142 Type 2 diabetes mellitus with diabetic polyneuropathy: Secondary | ICD-10-CM | POA: Diagnosis not present

## 2017-11-04 DIAGNOSIS — Z794 Long term (current) use of insulin: Secondary | ICD-10-CM | POA: Diagnosis not present

## 2017-11-04 DIAGNOSIS — J9801 Acute bronchospasm: Secondary | ICD-10-CM

## 2017-11-04 MED ORDER — GLIMEPIRIDE 2 MG PO TABS: 2.0000 mg | ORAL_TABLET | Freq: Every day | ORAL | 3 refills | Status: DC

## 2017-11-04 NOTE — Telephone Encounter (Signed)
Patient would to get a referral to see laura Jobe. Please advise

## 2017-11-04 NOTE — Telephone Encounter (Addendum)
Patient stated she is having trouble with her medication, her dizzy,head and hands are going numb, b/s running high over 600, and wouldn' go to the ER. b/s now 384

## 2017-11-04 NOTE — Progress Notes (Signed)
Subjective:    Patient ID: Kristie Tapia, female    DOB: 1970-10-30, 46 y.o.   MRN: [id number]  HPI Pt returns for f/u of diabetes mellitus: DM type: Insulin-requiring type 2.  Dx'ed: 1998 Complications: polyneuropathy.  Therapy: insulin since 2001.  GDM: never DKA: never Severe hypoglycemia: never Pancreatitis: never Pancreatic imaging: normal on 2017 CT Other: she is not a candidate for multiple daily injections, due to missing insulin doses; she has seen [facility] for nausea.  Interval history: No recent steroids. she brings a record of her cbg's which I have reviewed today.  It varies from 205-500.  There is no trend throughout the day.  She has nausea, and 1 episode of vomiting this am.  She takes tresiba 80/d.  She is unable to cite a reason for worsening cbg's.  She says she has not recently missed the insulin.   Past Medical History:  Diagnosis Date  . Anemia   . Anxiety   . Complication of anesthesia ` 1998   "lungs collapsed during LEEP procedure"  . Gallstones   . GERD (gastroesophageal reflux disease)   . H/O hiatal hernia   . History of stomach ulcers   . Migraines   . Sleep apnea   . Type II diabetes mellitus (HCC)     Past Surgical History:  Procedure Laterality Date  . ABDOMINAL EXPLORATION SURGERY     "I've had several"  . ABDOMINAL HYSTERECTOMY  08/1998  . APPENDECTOMY  01/03/2012  . DILATION AND CURETTAGE OF UTERUS    . LAPAROSCOPIC APPENDECTOMY  01/03/2012   Procedure: APPENDECTOMY LAPAROSCOPIC;  Surgeon: Mariella Saa, MD;  Location: [facility] OR;  Service: General;  Laterality: N/A;  . LEEP  1998   "lungs collapsed"  . TUBAL LIGATION  05/1998    Social History   Socioeconomic History  . Marital status: Married    Spouse name: Not on file  . Number of children: Not on file  . Years of education: Not on file  . Highest education level: Not on file  Occupational History  . Not on file  Social Needs  . Financial resource strain: Not on file  .  Food insecurity:    Worry: Not on file    Inability: Not on file  . Transportation needs:    Medical: Not on file    Non-medical: Not on file  Tobacco Use  . Smoking status: Former Smoker    Packs/day: 1.00    Years: 29.00    Pack years: 29.00    Types: Cigarettes    Last attempt to quit: 03/05/2017    Years since quitting: 0.6  . Smokeless tobacco: Never Used  . Tobacco comment: has smoked since age 71  Substance and Sexual Activity  . Alcohol use: No    Comment: hasn't  had s drink in over 5 yrs  . Drug use: No  . Sexual activity: Yes  Lifestyle  . Physical activity:    Days per week: Not on file    Minutes per session: Not on file  . Stress: Not on file  Relationships  . Social connections:    Talks on phone: Not on file    Gets together: Not on file    Attends religious service: Not on file    Active member of club or organization: Not on file    Attends meetings of clubs or organizations: Not on file    Relationship status: Not on file  . Intimate partner violence:  Fear of current or ex partner: Not on file    Emotionally abused: Not on file    Physically abused: Not on file    Forced sexual activity: Not on file  Other Topics Concern  . Not on file  Social History Narrative  . Not on file    Current Outpatient Medications on File Prior to Visit  Medication Sig Dispense Refill  . aspirin EC 81 MG tablet Take 1 tablet (81 mg total) by mouth daily. 365 tablet 0  . escitalopram (LEXAPRO) 20 MG tablet Take 1 tablet (20 mg total) by mouth daily. 90 tablet 3  . fluticasone (FLONASE) 50 MCG/ACT nasal spray Place 2 sprays into both nostrils daily. 16 g 6  . furosemide (LASIX) 20 MG tablet Take 0.5 tablets (10 mg total) by mouth daily. For relief of swelling. 90 tablet 0  . gabapentin (NEURONTIN) 300 MG capsule Take 2 capsules (600 mg total) by mouth 3 (three) times daily. 540 capsule 1  . insulin degludec (TRESIBA FLEXTOUCH) 100 UNIT/ML SOPN FlexTouch Pen Inject 0.7  mLs (70 Units total) into the skin daily. And pen needles 1/day 10 pen 11  . LORazepam (ATIVAN) 1 MG tablet Take 1 tablet (1 mg total) by mouth 2 (two) times daily as needed for anxiety (For acute panic only. No more than 60 tabs in one year from 07/06/17). 20 tablet 2  . metoCLOPramide (REGLAN) 10 MG tablet Take 1 tablet (10 mg total) by mouth every 6 (six) hours as needed for nausea. 25 tablet 2  . nicotine polacrilex (NICORELIEF) 2 MG gum Take 1 each (2 mg total) by mouth as needed for smoking cessation. 100 tablet 11  . pantoprazole (PROTONIX) 40 MG tablet   1  . promethazine (PHENERGAN) 25 MG tablet   0  . rosuvastatin (CRESTOR) 40 MG tablet Take 1 tablet (40 mg total) by mouth daily. 90 tablet 3  . topiramate ER (QUDEXY XR) CS24 sprinkle cap Take 3 capsules (75 mg total) by mouth daily. 90 each 2  . traZODone (DESYREL) 100 MG tablet Take 1 tablet (100 mg total) by mouth at bedtime. 30 tablet 11  . NOVOLOG 100 UNIT/ML injection INJECT 10 TO 15 UNITS SUBCUTANEOUSLY THREE TIMES DAILY BEFORE MEAL(S)  3   No current facility-administered medications on file prior to visit.     Allergies  Allergen Reactions  . Adhesive [Tape] Other (See Comments)    Blisters USE PAPER TAPE ONLY  . Sulfa Antibiotics Other (See Comments)    Burns the inside of my mouth; "blisters; leaves tongue and inside of mouth solid red"  . Decongestant [Pseudoephedrine Hcl Er] Itching    ALL DECONGESTANTS  . Prednisone Other (See Comments)    Gives me a bad attitude  . Percocet [Oxycodone-Acetaminophen] Other (See Comments)    PT reports having nightmares when taking Percocet  . Zoloft [Sertraline Hcl]   . Latex Itching    Family History  Problem Relation Age of Onset  . Breast cancer Unknown   . Diabetes Paternal Grandfather     BP 132/64 (BP Location: Left Arm, Patient Position: Sitting, Cuff Size: Normal)   Pulse 64   Ht 5' 5.5" (1.664 m)   Wt 188 lb (85.3 kg)   SpO2 97%   BMI 30.81 kg/m   Review of  Systems She denies hypoglycemia.  Denies sob.      Objective:   Physical Exam VITAL SIGNS:  See vs page GENERAL: no distress Pulses: dorsalis pedis intact bilat.  MSK: no deformity of the feet CV: no leg edema.  Skin:  no ulcer on the feet.  normal color and temp on the feet.  Neuro: sensation is intact to touch on the feet.    Lab Results  Component Value Date   CREATININE 0.80 10/12/2017   BUN 12 07/06/2017   NA 137 07/06/2017   K 4.9 10/12/2017   CL 100 07/06/2017   CO2 21 07/06/2017       Assessment & Plan:  Insulin-requiring type 2 DM, with polyneuropathy: worse.  Nausea: this appears to be affecting glycemic control, and vice-versa. weight gain: we discussed.  For this reason, she does not want to increase insulin.  Patient Instructions  Please continue the same tresiba. I have sent a prescription to your pharmacy, to add "glimepiride."  Please come back for a follow-up appointment in 2 months.   check your blood sugar twice a day.  vary the time of day when you check, between before the 3 meals, and at bedtime.  also check if you have symptoms of your blood sugar being too high or too low.  please keep a record of the readings and bring it to your next appointment here (or you can bring the meter itself).  You can write it on any piece of paper.  please call us sooner if your blood sugar goes below 70, or if you have a lot of readings over 200. Please call or message [place] in a few days, to tell us how the blood sugar is doing.  Please see Bonita Quin, to consider a "V-GO" device.

## 2017-11-04 NOTE — Patient Instructions (Addendum)
Please continue the same tresiba. I have sent a prescription to your pharmacy, to add "glimepiride."  Please come back for a follow-up appointment in 2 months.   check your blood sugar twice a day.  vary the time of day when you check, between before the 3 meals, and at bedtime.  also check if you have symptoms of your blood sugar being too high or too low.  please keep a record of the readings and bring it to your next appointment here (or you can bring the meter itself).  You can write it on any piece of paper.  please call us sooner if your blood sugar goes below 70, or if you have a lot of readings over 200. Please call or message us in a few days, to tell us how the blood sugar is doing.  Please see Bonita QuinLinda, to consider a "V-GO" device.

## 2017-11-05 NOTE — Telephone Encounter (Signed)
done

## 2017-11-06 ENCOUNTER — Telehealth: Payer: Self-pay

## 2017-11-06 ENCOUNTER — Telehealth: Payer: Self-pay | Admitting: Endocrinology

## 2017-11-06 NOTE — Telephone Encounter (Signed)
-----   Message from Davis GourdSarah E Terrell, CMA sent at 11/06/2017  1:42 PM EDT ----- Regarding: rectal bleedimg Patient called & stated that she is having rectal bleeding since taking glimepiride. She had a bowl movement earlier in the day & thought that she was just having a soft stool. When she wiped there was fresh red blood & blood clots in the toilet. Patient is still weak & tired as well. She said the only thing that has changed is the glimepiride. I asked about a history of hemorrhoids & she stated that she has not. She stated she has not lifted or strained. Please advise?

## 2017-11-06 NOTE — Telephone Encounter (Signed)
You should conclude that this is not related to the DM.  Please see PCP for this.

## 2017-11-06 NOTE — Telephone Encounter (Signed)
Sent note in messages.

## 2017-11-06 NOTE — Telephone Encounter (Signed)
I have advised patient to call PCP or gastro doctor ASAP. I advised this didn't soul like a DM issue & if things persisted or worsened before appointment then go to ER.

## 2017-11-07 ENCOUNTER — Ambulatory Visit: Payer: Self-pay

## 2017-11-07 ENCOUNTER — Ambulatory Visit: Payer: BLUE CROSS/BLUE SHIELD | Admitting: Physician Assistant

## 2017-11-07 ENCOUNTER — Ambulatory Visit (INDEPENDENT_AMBULATORY_CARE_PROVIDER_SITE_OTHER): Payer: BLUE CROSS/BLUE SHIELD

## 2017-11-07 ENCOUNTER — Other Ambulatory Visit: Payer: Self-pay

## 2017-11-07 ENCOUNTER — Encounter: Payer: Self-pay | Admitting: Physician Assistant

## 2017-11-07 VITALS — BP 106/62 | HR 71 | Temp 98.0°F | Resp 18 | Ht 65.5 in | Wt 186.6 lb

## 2017-11-07 DIAGNOSIS — K625 Hemorrhage of anus and rectum: Secondary | ICD-10-CM | POA: Diagnosis not present

## 2017-11-07 LAB — POCT CBC
GRANULOCYTE PERCENT: 77.9 % (ref 37–80)
HEMATOCRIT: 52.4 % — AB (ref 37.7–47.9)
Hemoglobin: 17.1 g/dL — AB (ref 12.2–16.2)
LYMPH, POC: 1.6 (ref 0.6–3.4)
MCH, POC: 27.6 pg (ref 27–31.2)
MCHC: 32.6 g/dL (ref 31.8–35.4)
MCV: 84.9 fL (ref 80–97)
MID (cbc): 0.3 (ref 0–0.9)
MPV: 7.5 fL (ref 0–99.8)
POC GRANULOCYTE: 7 — AB (ref 2–6.9)
POC LYMPH %: 18.3 % (ref 10–50)
POC MID %: 3.8 %M (ref 0–12)
Platelet Count, POC: 421 10*3/uL (ref 142–424)
RBC: 6.17 M/uL — AB (ref 4.04–5.48)
RDW, POC: 14.9 %
WBC: 9 10*3/uL (ref 4.6–10.2)

## 2017-11-07 LAB — HEMOCCULT GUIAC POC 1CARD (OFFICE): FECAL OCCULT BLD: NEGATIVE

## 2017-11-07 NOTE — Telephone Encounter (Signed)
  Reason for Disposition . MODERATE rectal bleeding (small blood clots, passing blood without stool, or toilet water turns red)  Answer Assessment - Initial Assessment Questions 1. APPEARANCE of BLOOD: "What color is it?" "Is it passed separately, on the surface of the stool, or mixed in with the stool?"      Bright red 2. AMOUNT: "How much blood was passed?"      Large amount 3. FREQUENCY: "How many times has blood been passed with the stools?"      One time yesterday 4. ONSET: "When was the blood first seen in the stools?" (Days or weeks)      Yesterday 5. DIARRHEA: "Is there also some diarrhea?" If so, ask: "How many diarrhea stools were passed in past 24 hours?"      No 6. CONSTIPATION: "Do you have constipation?" If so, "How bad is it?"     Yes - but not recently 7. RECURRENT SYMPTOMS: "Have you had blood in your stools before?" If so, ask: "When was the last time?" and "What happened that time?"      No 8. BLOOD THINNERS: "Do you take any blood thinners?" (e.g., Coumadin/warfarin, Pradaxa/dabigatran, aspirin)     Asa 9. OTHER SYMPTOMS: "Do you have any other symptoms?"  (e.g., abdominal pain, vomiting, dizziness, fever)     No 10. PREGNANCY: "Is there any chance you are pregnant?" "When was your last menstrual period?"       No  Protocols used: RECTAL BLEEDING-A-AH

## 2017-11-07 NOTE — Progress Notes (Signed)
11/07/2017 2:31 PM   DOB: May 10, 1971 / MRN: [id number]  SUBJECTIVE:  Kristie Tapia is a 47 y.o. female presenting for BRBPR that started yesterday.  Is improved as of today.  Does take 81 ASA for preventative purposes. No new abdominal pain or nausea.  Associates fatigue.  Feels that she is getting worse.   She is allergic to adhesive [tape]; sulfa antibiotics; decongestant [pseudoephedrine hcl er]; prednisone; percocet [oxycodone-acetaminophen]; zoloft [sertraline hcl]; and latex.   She  has a past medical history of Anemia, Anxiety, Complication of anesthesia (` 1998), Gallstones, GERD (gastroesophageal reflux disease), H/O hiatal hernia, History of stomach ulcers, Migraines, Sleep apnea, and Type II diabetes mellitus (HCC).    She  reports that she has been smoking cigarettes.  She has a 29.00 pack-year smoking history. She has never used smokeless tobacco. She reports that she does not drink alcohol or use drugs. She  reports that she currently engages in sexual activity. The patient  has a past surgical history that includes Appendectomy (01/03/2012); Abdominal hysterectomy (08/1998); Dilation and curettage of uterus; Abdominal exploration surgery; Tubal ligation (05/1998); LEEP (1998); and laparoscopic appendectomy (01/03/2012).  Her family history includes Breast cancer in her unknown relative; Diabetes in her paternal grandfather.  Review of Systems  Constitutional: Negative for chills, diaphoresis and fever.  Cardiovascular: Negative for chest pain.  Gastrointestinal: Negative for nausea.  Skin: Negative for rash.  Neurological: Negative for dizziness.    The problem list and medications were reviewed and updated by myself where necessary and exist elsewhere in the encounter.   OBJECTIVE:  BP 106/62   Pulse 71   Temp 98 F (36.7 C) (Oral)   Resp 18   Ht 5' 5.5" (1.664 m)   Wt 186 lb 9.6 oz (84.6 kg)   SpO2 95%   BMI 30.58 kg/m   Physical Exam  Constitutional: She is  oriented to person, place, and time. She appears well-nourished. No distress.  Eyes: Pupils are equal, round, and reactive to light. EOM are normal. No scleral icterus.  Cardiovascular: Normal rate, regular rhythm, S1 normal, S2 normal, normal heart sounds and intact distal pulses. Exam reveals no gallop, no friction rub and no decreased pulses.  No murmur heard. Pulmonary/Chest: Effort normal. No stridor. No respiratory distress. She has no wheezes. She has no rales.  Abdominal: Soft. Normal appearance and bowel sounds are normal. She exhibits no distension and no mass. There is tenderness (generalized). There is no rigidity, no rebound, no guarding and no CVA tenderness. No hernia.  Musculoskeletal: Normal range of motion. She exhibits no edema or tenderness.  Neurological: She is alert and oriented to person, place, and time. No cranial nerve deficit. Gait normal.  Skin: Skin is dry. No rash noted. She is not diaphoretic. No erythema. No pallor.  Psychiatric: She has a normal mood and affect. Her behavior is normal. Judgment and thought content normal.  Vitals reviewed.   Results for orders placed or performed in visit on 11/07/17 (from the past 72 hour(s))  Hemoccult - 1 Card (office)     Status: None   Collection Time: 11/07/17  2:04 PM  Result Value Ref Range   Fecal Occult Blood, POC Negative Negative   Card #1 Date 11/07/17    Card #2 Fecal Occult Blod, POC     Card #2 Date     Card #3 Fecal Occult Blood, POC     Card #3 Date    POCT CBC     Status:  Abnormal   Collection Time: 11/07/17  2:13 PM  Result Value Ref Range   WBC 9.0 4.6 - 10.2 K/uL   Lymph, poc 1.6 0.6 - 3.4   POC LYMPH PERCENT 18.3 10 - 50 %L   MID (cbc) 0.3 0 - 0.9   POC MID % 3.8 0 - 12 %M   POC Granulocyte 7.0 (A) 2 - 6.9   Granulocyte percent 77.9 37 - 80 %G   RBC 6.17 (A) 4.04 - 5.48 M/uL   Hemoglobin 17.1 (A) 12.2 - 16.2 g/dL   HCT, POC 13.0 (A) 86.5 - 47.9 %   MCV 84.9 80 - 97 fL   MCH, POC 27.6 27 -  31.2 pg   MCHC 32.6 31.8 - 35.4 g/dL   RDW, POC 78.4 %   Platelet Count, POC 421 142 - 424 K/uL   MPV 7.5 0 - 99.8 fL   CBC Latest Ref Rng & Units 11/07/2017 10/12/2017 07/06/2017  WBC 4.6 - 10.2 K/uL 9.0 10.2 10.0  Hemoglobin 12.2 - 16.2 g/dL 17.1(A) 16.3(H) 17.0(H)  Hematocrit 37.7 - 47.9 % 52.4(A) 49.5(H) 48.7(H)  Platelets 150 - 379 x10E3/uL - 372 396(H)     Dg Abd 2 Views  Result Date: 11/07/2017 CLINICAL DATA:  Abdominal pain and rectal bleeding EXAM: ABDOMEN - 2 VIEW COMPARISON:  CT abdomen September 02, 2017 FINDINGS: Supine and upright images were obtained. There is moderate stool throughout the colon. There is no bowel dilatation or air-fluid level to suggest bowel obstruction. No free air. There are multiple apparent phleboliths in the pelvis. There is a surgical clip in the right upper abdomen. Lung bases are clear. IMPRESSION: Moderate stool throughout colon. No bowel obstruction or free air evident. Lung bases clear. Electronically Signed   By: Bretta Bang III M.D.   On: 11/07/2017 14:19    ASSESSMENT AND PLAN:  Kristie Tapia was seen today for rectal bleeding.  Diagnoses and all orders for this visit:  Rectal bleeding: Results here are normal. ED precautions provided. Will see her back in about 10 days for follow-up.  She may come back sooner and see any of our clinicians if her symptoms are changing.  -     Hemoccult - 1 Card (office) -     POCT CBC -     DG Abd 2 Views; Future    The patient is advised to call or return to clinic if she does not see an improvement in symptoms, or to seek the care of the closest emergency department if she worsens with the above plan.   Kristie Tapia, MHS, PA-C Primary Care at [facility] 11/07/2017 2:31 PM

## 2017-11-07 NOTE — Telephone Encounter (Signed)
Pt. Reports she had one episode of seeing blood with stool - happened yesterday.  Thought she was having a loose stool, but "the commode was full of blood , some dark red and some bright red." States she has IBS and has been seeing GI. States she called their office and they could not see her. Denies any abdominal pain. Does take a baby aspirin daily.

## 2017-11-07 NOTE — Patient Instructions (Addendum)
Hold the aspirin for now. Go to the hospital if you are having worsening dizziness, abdominal, chest pain, shortness of breath.     IF you received an x-ray today, you will receive an invoice from [facility]. Please contact [facility] at [phone] with questions or concerns regarding your invoice.   IF you received labwork today, you will receive an invoice from [facility]. Please contact [facility] at [phone] with questions or concerns regarding your invoice.   Our billing staff will not be able to assist you with questions regarding bills from these companies.  You will be contacted with the lab results as soon as they are available. The fastest way to get your results is to activate your My Chart account. Instructions are located on the last page of this paperwork. If you have not heard from us regarding the results in 2 weeks, please contact this office.

## 2017-11-10 NOTE — Progress Notes (Signed)
NEUROPSYCHOLOGICAL EVALUATION   Name:    Kristie Tapia  Date of Birth:   1971-01-29 Date of Interview:  10/22/2017 Date of Testing:  10/30/2017   Date of Feedback:  11/12/2017       Background Information:  Reason for Referral:  Kristie Tapia is a 47 y.o. female referred by Dr. Shon Millet to assess her current level of cognitive functioning and assist in differential diagnosis. The current evaluation consisted of a review of available medical records, an interview with the patient, and the completion of a neuropsychological testing battery. Informed consent was obtained.  History of Presenting Problem:  Kristie Tapia has a history of hypertension, uncontrolled type 2 diabetes (A1c was 9.8 this month), fibromyalgia, anxiety disorder, and tobacco use disorder (smokes 1 ppd). She also is followed by Dr. Everlena Cooper for migraine and headache. She has reported worsening memory since 2018. She has no family history of dementia including early onset dementia. She had an MRI of the brain without contrast on 05/01/13 at an outside facility that demonstrated an ovoid area of hyperattenuation on T2 weighted images within the right thalamus measuring less than 5 mm in diameter, as well as punctate are of signal hyperintensity within the right superior temporal lobe at the gray-white junction and a punctate hyperintense focus and a linear juxtacortical lesion within the right frontal lobe concerning for demyelination. Follow up MRI with contrast from 05/09/13 revealed no enhancement. 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 Based on these findings, triptans were discontinued. She was started on topiramate in January 2019 and continues to take this.   The patient continues to experiencing gradually worsening memory. She reported difficulty retrieving overlearned information such as her current age. She will walk in a room and forget why she went in there. This happens 5 or 6 times a day in the same room for the same thing. She will put something in the microwave and forget about it, and then later go to heat something else up and find it in there. She will leave things on the stove; she has even left the house before while something was cooking on the stove, because she forgot about it. She will forget what she is saying in the middle of a sentence. She forgets to complete tasks like running specific errands.  Upon direct questioning, the patient/caregiver reported:   Forgetting recent conversations/events: Yes, she has forgotten that she went somewhere the day before Repeating statements/questions: Yes Misplacing/losing items: Yes Forgetting appointments or other obligations: No, she writes them on several calendars  Forgetting to take medications: No, she uses pill planners, but does have problem remembering what day of the week it is Difficulty concentrating: Yes Starting but not finishing tasks: Yes Distracted easily: Yes Word-finding difficulty: Yes Getting lost when driving: No Making wrong turns when driving: No Uncertain about directions when driving or passenger: No. "I go to very few places."  The patient is unemployed, seeking disability. She last worked in October 2016 for [employer]. She had a  foot injury at work. 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 She manages all complex ADLs including driving, medications, finances, appointments and cooking.   When asked about management  of her diabetes, she stated, "I eat things I shouldn't considering I'm diabetic." She does not wish to stop drinking soda or quit smoking cigarettes. She does not check her blood sugars regularly. She frequently forgets to eat and then gets dizzy, shaky and irritable with increased anxiety. When she does check her blood sugars, they are low.  She still has migraines and headaches regularly/daily. She sometimes has "ice pick headaches" which only last for a minute but are excruciating. In the past few weeks, she has had a new type of sensation in the frontal part of her head characterized by constant vibrating/tingling sensation. She reports she has hit her head many times in the past but never lost consciousness or had a concussion.  She has insomnia with difficulty falling asleep. She can be exhausted and still not fall asleep. She never feels rested upon awakening. She always feels tired. She has been tested for sleep apnea several times; she states that twice she was told she had it but then most recently she was told she did not.  She reports a history of anxiety and also two prior depressive episodes (one in the context of taking Chantix and resolved after discontinuing the medication; one while she was going through a very stressful period). She denies any history of suicidal ideation or intention. She reports feeling unappreciated but she does enjoy spending time with her animals.   She denied any history of illicit substance use or dependence. She does not drink any alcohol.  Of note, she reported that she has recent onset of visual illusions wherein she sees "blotches" on walls and, in low light areas, it appears to her as though she is looking through a screen. She has had two recent diabetic eye exams and was told by her eye doctor that the symptoms is not due to a problem with her eyes, and could be neurologic in nature.    Social History: Born/Raised: [place] Education: Some  college Occupational history: Previously worked for [employer]. Unemployed, seeking disability. Has not worked since 2016 when she had a foot injury on the job. Marital history: Currently married. Divorced from two prior marriages.  Children: 3 daughters, ages 13, 51, 61 Alcohol: None in over 6 years, but was never dependent on it/never been an alcoholic Tobacco: 1 ppd SA: Denies   Medical History:  Past Medical History:  Diagnosis Date  . Anemia   . Anxiety   . Complication of anesthesia ` 1998   "lungs collapsed during LEEP procedure"  . Gallstones   . GERD (gastroesophageal reflux disease)   . H/O hiatal hernia   . History of stomach ulcers   . Migraines   . Sleep apnea   . Type II diabetes mellitus (HCC)     Current medications:  Outpatient Encounter Medications as of 11/12/2017  Medication Sig  . aspirin EC 81 MG tablet Take 1 tablet (81 mg total) by mouth daily.  Marland Kitchen escitalopram (LEXAPRO) 20 MG tablet Take 1 tablet (20 mg total) by mouth daily.  . fluticasone (FLONASE) 50 MCG/ACT nasal spray Place 2 sprays into both nostrils daily.  . furosemide (LASIX) 20 MG tablet Take 0.5 tablets (10 mg total) by mouth daily. For relief of swelling.  . gabapentin (NEURONTIN) 300 MG capsule Take 2 capsules (600 mg total) by mouth  3 (three) times daily.  Marland Kitchen glimepiride (AMARYL) 2 MG tablet Take 1 tablet (2 mg total) by mouth daily before breakfast.  . insulin degludec (TRESIBA FLEXTOUCH) 100 UNIT/ML SOPN FlexTouch Pen Inject 0.7 mLs (70 Units total) into the skin daily. And pen needles 1/day  . LORazepam (ATIVAN) 1 MG tablet Take 1 tablet (1 mg total) by mouth 2 (two) times daily as needed for anxiety (For acute panic only. No more than 60 tabs in one year from 07/06/17).  . metoCLOPramide (REGLAN) 10 MG tablet Take 1 tablet (10 mg total) by mouth every 6 (six) hours as needed for nausea.  . nicotine polacrilex (NICORELIEF) 2 MG gum Take 1 each (2 mg total) by mouth as needed for smoking cessation.  (Patient not taking: Reported on 11/07/2017)  . NOVOLOG 100 UNIT/ML injection INJECT 10 TO 15 UNITS SUBCUTANEOUSLY THREE TIMES DAILY BEFORE MEAL(S)  . pantoprazole (PROTONIX) 40 MG tablet   . promethazine (PHENERGAN) 25 MG tablet   . rosuvastatin (CRESTOR) 40 MG tablet Take 1 tablet (40 mg total) by mouth daily.  Marland Kitchen topiramate ER (QUDEXY XR) CS24 sprinkle cap Take 3 capsules (75 mg total) by mouth daily.  . traZODone (DESYREL) 100 MG tablet Take 1 tablet (100 mg total) by mouth at bedtime.   No facility-administered encounter medications on file as of 11/12/2017.      Current Examination:  Behavioral Observations:  Appearance: Casually dressed, mildly disheveled  Gait: Ambulated independently, no gross abnormalities observed Speech: Fluent; normal rate, rhythm and volume. Minimal word finding difficulty. Thought process: Generally linear, goal directed Affect: Full, appropriate Interpersonal: Pleasant, mildly disinhibited  Orientation: Oriented to all spheres. Accurately named the current President and his predecessor.   Tests Administered: . Test of Premorbid Functioning (TOPF) . Wechsler Adult Intelligence Scale-Fourth Edition (WAIS-IV): Similarities, Information, Block Design, Matrix Reasoning, Arithmetic, Symbol Search, Coding and Digit Span subtests . Wechsler Memory Scale-Fourth Edition (WMS-IV) Adult Version (ages 15-69): Logical Memory I, II and Recognition subtests  . [place] Verbal Learning Test - 2nd Edition (CVLT-2) Standard Form . [employer] (WCST) . Repeatable Battery for the Assessment of Neuropsychological Status (RBANS) Form A:  Figure Copy and Figure Recall subtests . Neuropsychological Assessment Battery (NAB) Language Module: Form 1: Naming subtest . Controlled Oral Word Association Test (COWAT) . Trail Making Test A and B . [place] Diagnostic Aphasia Examination (BDAE): Complex Ideational Material Subtest . Beck Depression Inventory - Second  edition (BDI-II) . Generalized Anxiety Disorder - 7 item screener (GAD-7)  Test Results: Note: Standardized scores are presented only for use by appropriately trained professionals and to allow for any future test-retest comparison. These scores should not be interpreted without consideration of all the information that is contained in the rest of the report. The most recent standardization samples from the test publisher or other sources were used whenever possible to derive standard scores; scores were corrected for age, gender, ethnicity and education when available.   Test Scores:  Test Name Raw Score Standardized Score Descriptor  TOPF 13/70 SS= 71 Borderline  WAIS-IV Subtests     Similarities 22/36 ss= 8 Average  Information 11/26 ss= 8 Average  Block Design 35/66 ss= 9 Average  Matrix Reasoning 15/26 ss= 9 Average  Arithmetic 9/22 ss= 6 Low average  Symbol Search 19/60 ss= 5 Borderline  Coding 40/135 ss= 5 Borderline  Digit Span 17/48 ss= 5 Borderline  WAIS-IV Index Scores     Verbal Comprehension  SS= 89 Low average  Perceptual Reasoning  SS= 94 Average  Working Memory  SS= 74 Borderline  Processing Speed  SS= 74 Borderline  Full Scale IQ (8 subtest)  SS= 79 Borderline  WMS-IV Subtests     LM I 25/50 ss= 10 Average  LM II 21/50 ss= 10 Average  LM II Recognition 27/30 Cum %: >75 WNL  CVLT-II Scores     Trial 1 4/16 Z= -1.5 Borderline  Trial 5 10/16 Z= -1 Low average  Trials 1-5 total 35/80 T= 34 Borderline  SD Free Recall 10/16 Z= -0.5 Average  SD Cued Recall 11/16 Z= -0.5 Average  LD Free Recall 9/16 Z= -1 Low average  LD Cued Recall 11/16 Z= -0.5 Average  Recognition Discriminability 13/16 hits 0 false positives Z= 0 Average  Forced Choice Recognition 16/16  WNL  WCST     Total Errors 10 T= 50 Average  Perseverative Responses 7 T= 44 Average  Perseverative Errors 6 T= 45 Average  Conceptual Level Responses 53 T= 51 Average  Categories Completed 4 >16% WNL   Trials to Complete 1st Category 11 >16% WNL  Failure to Maintain Set 0  WNL  RBANS Subtests     Figure Copy 19/20 Z= 0.5 Average   Figure Recall 11/20 Z= -0.8 Low average  NAB Language Naming 31/31 T= 54 Average  COWAT-FAS 16 T= 24 Severely impaired  COWAT-Animals 15 T= 37 Low average  Trail Making Test A  39" 0 errors T= 41 Low average  Trail Making Test B  96" 0 errors T= 33 Borderline  BDAE Complex Ideational Material 12/12  WNL  BDI-II 12/63  WNL  GAD-7 3/21  WNL     Description of Test Results:  Embedded performance validity indicators were within normal limits. As such, the patient's current performance on neurocognitive testing is judged to be a relatively accurate representation of his current level of neurocognitive functioning.   Premorbid verbal intellectual abilities were estimated to have been within the borderline range based on a test of word reading. Current verbal IQ fell within the low average range. Nonverbal IQ fell within the average range.  Psychomotor processing speed was borderline impaired. Auditory attention and working memory were borderline impaired. Visual-spatial construction was average. Language abilities were within normal limits. Specifically, confrontation naming was average, and semantic verbal fluency was low average. Auditory comprehension of complex ideational material was intact. With regard to verbal memory, encoding and acquisition of non-contextual information (i.e., word list) was borderline impaired. After a brief interference task, free recall was average (10/16 items). Cued recall was average (11/16 items). After a delay, free recall was low average (9/16 items). Cued recall was average (11/16 items). Performance on a yes/no recognition task was average. On another verbal memory test, encoding and acquisition of contextual auditory information (i.e., short stories) was average. After a delay, free recall was average. Performance on a yes/no  recognition task was above average. With regard to non-verbal memory, delayed free recall of visual information was low average. Executive functioning was variable. Mental flexibility and set-shifting were borderline impaired on Trails B. Verbal fluency with phonemic search restrictions was severely impaired. Verbal abstract reasoning was average. Non-verbal abstract reasoning was average. Deductive reasoning was average.   On self-report measures of mood, the patient's responses were not indicative of clinically significant depression or anxiety at the present time. However, she did endorse some depressive symptoms, including the following: anhedonia (although she reports this is due to nausea), pessimism, loss of energy (again reports this is due to  nausea), increased sleep, irritability, increased appetite, concentration difficulty, fatigue and reduced libido. She denied suicidal ideation or intention. She denied symptoms of generalized anxiety.   Clinical Impressions: Vascular cognitive impairment. Results of cognitive testing revealed deficits in processing speed, auditory attention, mental flexibility/set-shifting, phonemic fluency, and initial encoding of non-contextual information. This cognitive profile is highly suggestive of frontal-subcortical dysfunction. Her cognitive profile, medical history (including multiple vascular risk factors, currently uncontrolled DM II, and current tobacco use) and neuroimaging results all support a diagnosis of vascular cognitive impairment.  She denies symptoms of primary psychiatric disorders but I do see evidence of adjustment related depression and anxiety (related to coping with chronic medical illness).  Topirimate could be exacerbating cognitive dysfunction to some extent but is not likely the primary etiology.    Recommendations: To have the best chance of preventing worsening of cognitive function, optimal control of vascular risk factors is essential.  I discussed the importance of smoking cessation and adherence to dietary recommendations for diabetes. She feels that when she has stopped smoking in the past it only made things worse. She also gives multiple reasons as to why she does not feel she can change her diet. She demonstrates an external locus of control for her health. She may benefit from working with a health psychologist to enhance motivation for change and develop alternative, healthy coping strategies for stress/mood (other than cigarettes). I discussed this with her as well but she does not appear interested at this time. I provided written information on vascular cognitive impairment and explained this information to her.    Feedback to Patient: Kristie ReeveLora Tapia returned for a feedback appointment on 11/12/2017 to review the results of her neuropsychological evaluation with this provider. 35 minutes face-to-face time was spent reviewing her test results, my impressions and my recommendations as detailed above.    Total time spent on this patient's case: 95 minutes for neurobehavioral status exam with psychologist (CPT code [id number], [id number]); 150 minutes of testing/scoring by psychometrician under psychologist's supervision (CPT codes [phone], [phone] units); 200 minutes for integration of patient data, interpretation of standardized test results and clinical data, clinical decision making, treatment planning and preparation of this report, and interactive feedback with review of results to the patient/family by psychologist (CPT codes [phone], [phone] units).      Thank you for your referral of Kristie Tapia. Please feel free to contact me if you have any questions or concerns regarding this report.

## 2017-11-12 ENCOUNTER — Encounter: Payer: BLUE CROSS/BLUE SHIELD | Admitting: Dietician

## 2017-11-12 ENCOUNTER — Encounter: Payer: Self-pay | Admitting: Psychology

## 2017-11-12 ENCOUNTER — Ambulatory Visit (INDEPENDENT_AMBULATORY_CARE_PROVIDER_SITE_OTHER): Payer: BLUE CROSS/BLUE SHIELD | Admitting: Psychology

## 2017-11-12 DIAGNOSIS — G3184 Mild cognitive impairment, so stated: Secondary | ICD-10-CM | POA: Diagnosis not present

## 2017-11-12 DIAGNOSIS — I679 Cerebrovascular disease, unspecified: Secondary | ICD-10-CM

## 2017-11-12 DIAGNOSIS — R413 Other amnesia: Secondary | ICD-10-CM

## 2017-11-13 ENCOUNTER — Encounter: Payer: BLUE CROSS/BLUE SHIELD | Attending: Endocrinology | Admitting: Nutrition

## 2017-11-13 DIAGNOSIS — Z794 Long term (current) use of insulin: Secondary | ICD-10-CM | POA: Insufficient documentation

## 2017-11-13 DIAGNOSIS — Z713 Dietary counseling and surveillance: Secondary | ICD-10-CM | POA: Insufficient documentation

## 2017-11-13 DIAGNOSIS — E1142 Type 2 diabetes mellitus with diabetic polyneuropathy: Secondary | ICD-10-CM | POA: Insufficient documentation

## 2017-11-13 DIAGNOSIS — E119 Type 2 diabetes mellitus without complications: Secondary | ICD-10-CM

## 2017-11-14 ENCOUNTER — Telehealth: Payer: Self-pay | Admitting: Endocrinology

## 2017-11-14 MED ORDER — NOVOLOG 100 UNIT/ML ~~LOC~~ SOLN: SUBCUTANEOUS | 3 refills | Status: DC

## 2017-11-14 MED ORDER — V-GO 40 KIT: 1.0000 | PACK | Freq: Every day | 11 refills | Status: AC

## 2017-11-14 NOTE — Telephone Encounter (Signed)
-----   Message from Jessica PriestLinda D Spagnola, RN sent at 11/13/2017  3:09 PM EDT ----- Regarding: V-go start Please put in a referral for a V-go start for this patient.  She will be coming in on Monday when you return to see me for this.  We have done the insurance investigation today, so she will know her cost before coming in.  Thank you

## 2017-11-14 NOTE — Telephone Encounter (Signed)
Ok, I have sent a prescription to her pharmacy

## 2017-11-14 NOTE — Progress Notes (Signed)
Pt. Is here to learn about the V-Go.  She was shown the device, and says she is wanting to give it a try, but no orders are in the chart and Dr. Loanne Drilling is on vacation.  Paperwork for insurance verification was filled out and faxed, and she was schedule in 2 weeks, when Dr. Loanne Drilling returns.  She had no final quesitons. She is also interested in starting the [place] sensor.  She is scheduled for training for this next week and to receive a free starter kit.

## 2017-11-14 NOTE — Patient Instructions (Signed)
Return in 2 weeks for V-Go training

## 2017-11-15 NOTE — Telephone Encounter (Signed)
I also received a PA form for v-go for patient. I have filled this form out & is left on your desk to be signed.

## 2017-11-18 ENCOUNTER — Encounter: Payer: Self-pay | Admitting: Physician Assistant

## 2017-11-18 ENCOUNTER — Other Ambulatory Visit: Payer: Self-pay

## 2017-11-18 ENCOUNTER — Ambulatory Visit: Payer: BLUE CROSS/BLUE SHIELD | Admitting: Physician Assistant

## 2017-11-18 ENCOUNTER — Telehealth: Payer: Self-pay | Admitting: Endocrinology

## 2017-11-18 VITALS — BP 120/78 | HR 77 | Temp 98.1°F | Ht 65.55 in | Wt 191.4 lb

## 2017-11-18 DIAGNOSIS — J3489 Other specified disorders of nose and nasal sinuses: Secondary | ICD-10-CM | POA: Diagnosis not present

## 2017-11-18 DIAGNOSIS — J029 Acute pharyngitis, unspecified: Secondary | ICD-10-CM

## 2017-11-18 LAB — POCT RAPID STREP A (OFFICE): RAPID STREP A SCREEN: NEGATIVE

## 2017-11-18 MED ORDER — DOXYCYCLINE HYCLATE 100 MG PO CAPS: 100.0000 mg | ORAL_CAPSULE | Freq: Two times a day (BID) | ORAL | 0 refills | Status: AC

## 2017-11-18 NOTE — Telephone Encounter (Signed)
Insulin Disposable Pump (V-GO 40) KIT  Patient stated that she is needing a PA for V-Go pump  Please advise

## 2017-11-18 NOTE — Progress Notes (Signed)
11/19/2017 11:31 AM   DOB: 04-22-1971 / MRN: [id number]  SUBJECTIVE:  Kristie Tapia is a 47 y.o. female presenting for sinus pain and sore throat x 7 days and is she is worsening. . She has some paperwork with her that she would like me to complete that would allow her to receive disability payments.  She has a long history of poorly controlled diabetes, smoking, and has some imaging consistent with previous mild CVA.  She has symptoms of forgetfulness and difficulty with performing simple task.  She is seen by neurology who has diagnosed her with dementia, and given her scans and symptoms this diagnosis does fit, albeit mild.    Per her last Neurology visit on 11/12/17: Clinical Impressions: Vascular cognitive impairment. Results of cognitive testing revealed deficits in processing speed, auditory attention, mental flexibility/set-shifting, phonemic fluency, and initial encoding of non-contextual information. This cognitive profile is highly suggestive of frontal-subcortical dysfunction. Her cognitive profile, medical history (including multiple vascular risk factors, currently uncontrolled DM II, and current tobacco use) and neuroimaging results all support a diagnosis of vascular cognitive impairment.  She denies symptoms of primary psychiatric disorders but I do see evidence of adjustment related depression and anxiety (related to coping with chronic medical illness).  Topirimate could be exacerbating cognitive dysfunction to some extent but is not likely the primary etiology.     She is allergic to adhesive [tape]; sulfa antibiotics; decongestant [pseudoephedrine hcl er]; prednisone; percocet [oxycodone-acetaminophen]; zoloft [sertraline hcl]; and latex.   She  has a past medical history of Anemia, Anxiety, Complication of anesthesia (` 1998), Gallstones, GERD (gastroesophageal reflux disease), H/O hiatal hernia, History of stomach ulcers, Migraines, Sleep apnea, and Type II diabetes  mellitus (HCC).    She  reports that she has been smoking cigarettes.  She has a 29.00 pack-year smoking history. She has never used smokeless tobacco. She reports that she does not drink alcohol or use drugs. She  reports that she currently engages in sexual activity. The patient  has a past surgical history that includes Appendectomy (01/03/2012); Abdominal hysterectomy (08/1998); Dilation and curettage of uterus; Abdominal exploration surgery; Tubal ligation (05/1998); LEEP (1998); and laparoscopic appendectomy (01/03/2012).  Her family history includes Breast cancer in her unknown relative; Diabetes in her paternal grandfather.  Review of Systems  Constitutional: Negative for chills, diaphoresis and fever.  HENT: Positive for congestion, sinus pain and sore throat. Negative for ear discharge, ear pain, hearing loss, nosebleeds and tinnitus.   Respiratory: Negative for cough, hemoptysis, sputum production, shortness of breath, wheezing and stridor.   Cardiovascular: Negative for chest pain, orthopnea and leg swelling.  Gastrointestinal: Negative for nausea.  Skin: Negative for rash.  Neurological: Negative for dizziness.    The problem list and medications were reviewed and updated by myself where necessary and exist elsewhere in the encounter.   OBJECTIVE:  BP 120/78 (BP Location: Left Arm, Patient Position: Sitting, Cuff Size: Normal)   Pulse 77   Temp 98.1 F (36.7 C) (Oral)   Ht 5' 5.55" (1.665 m)   Wt 191 lb 6.4 oz (86.8 kg)   SpO2 96%   BMI 31.32 kg/m   Physical Exam  Constitutional: She is oriented to person, place, and time. She appears well-nourished. No distress.  HENT:  Nose: Right sinus exhibits maxillary sinus tenderness. Right sinus exhibits no frontal sinus tenderness. Left sinus exhibits no maxillary sinus tenderness and no frontal sinus tenderness.  Eyes: Pupils are equal, round, and reactive to light. EOM are normal.  Cardiovascular: Normal rate, regular rhythm, S1  normal, S2 normal, normal heart sounds and intact distal pulses. Exam reveals no gallop, no friction rub and no decreased pulses.  No murmur heard. Pulmonary/Chest: Effort normal. No stridor. No respiratory distress. She has no wheezes. She has no rales.  Abdominal: She exhibits no distension.  Musculoskeletal: She exhibits no edema.  Neurological: She is alert and oriented to person, place, and time. No cranial nerve deficit. Gait normal.  Skin: Skin is dry. She is not diaphoretic.  Psychiatric: Her affect is blunt and labile. Her affect is not inappropriate. Her speech is delayed. She is slowed. Cognition and memory are impaired (She continues to ask me to refer her to a cardiacneurologist. ). She does not express inappropriate judgment. She expresses no suicidal plans and no homicidal plans. She exhibits normal recent memory and normal remote memory.  Vitals reviewed.   Results for orders placed or performed in visit on 11/18/17 (from the past 72 hour(s))  POCT rapid strep A     Status: None   Collection Time: 11/18/17  1:43 PM  Result Value Ref Range   Rapid Strep A Screen Negative Negative    No results found.  ASSESSMENT AND PLAN:  Kristie Tapia was seen today for follow-up.  Diagnoses and all orders for this visit:  Sore throat: Possibly viral given conglomeration of symptoms however duration of sinus pain has been for over two weeks and she has maxillary tenderness. She has taken doxy in the past without side effects.  -     POCT rapid strep A -     Culture, Group A Strep  Sinus pain -     doxycycline (VIBRAMYCIN) 100 MG capsule; Take 1 capsule (100 mg total) by mouth 2 (two) times daily for 10 days.  Vascular cognitive impairment: She certainly has imaging, clinical symptoms per neurology, along with a history for such a diagnosis.  I have completed disability paperwork for her today attesting to the clinical syndrome and her inability to work.   The patient is advised to call or  return to clinic if she does not see an improvement in symptoms, or to seek the care of the closest emergency department if she worsens with the above plan.   Deliah BostonMichael Vuk Skillern, MHS, PA-C [facility] at [facility] 11/19/2017 11:31 AM

## 2017-11-18 NOTE — Patient Instructions (Signed)
     IF you received an x-ray today, you will receive an invoice from [facility]. Please contact [facility] at [phone] with questions or concerns regarding your invoice.   IF you received labwork today, you will receive an invoice from [facility]. Please contact [facility] at [phone] with questions or concerns regarding your invoice.   Our billing staff will not be able to assist you with questions regarding bills from these companies.  You will be contacted with the lab results as soon as they are available. The fastest way to get your results is to activate your My Chart account. Instructions are located on the last page of this paperwork. If you have not heard from us regarding the results in 2 weeks, please contact this office.     

## 2017-11-19 NOTE — Telephone Encounter (Signed)
I spoke with patient &  I notified her that we did have her paperwork for PA. I have filled it out & am waiting on Dr. Cordelia Pen return to sign it. Patient has appointment on the 24th when he returns to get v-go starter kit.

## 2017-11-20 ENCOUNTER — Other Ambulatory Visit: Payer: Self-pay

## 2017-11-20 ENCOUNTER — Telehealth: Payer: Self-pay | Admitting: Endocrinology

## 2017-11-20 LAB — CULTURE, GROUP A STREP: Strep A Culture: NEGATIVE

## 2017-11-20 NOTE — Telephone Encounter (Signed)
Ok to send these in for patient?

## 2017-11-20 NOTE — Telephone Encounter (Signed)
Patient need refills sent in for, 14 da libre sensor. Send to  Pharmacy:  [id number] [place] [address] DEA #:  --

## 2017-11-20 NOTE — Telephone Encounter (Signed)
OK 

## 2017-11-21 ENCOUNTER — Telehealth: Payer: Self-pay | Admitting: Endocrinology

## 2017-11-21 MED ORDER — FREESTYLE LIBRE 14 DAY SENSOR MISC: 1.0000 | 2 refills | Status: DC

## 2017-11-21 NOTE — Telephone Encounter (Signed)
Sent!

## 2017-11-21 NOTE — Telephone Encounter (Signed)
Patient stated she is having low b/s 60 or less, she drank some pineapple juice and rechecked it again it went up to 108, checked it again it went up to 124, but she is doing ok now.  Called [facility]

## 2017-11-22 NOTE — Telephone Encounter (Signed)
LVM with patient giving specific instructions. I asked patient to call back with further questions.

## 2017-11-22 NOTE — Telephone Encounter (Signed)
No her appointment to get set up is Monday.

## 2017-11-22 NOTE — Telephone Encounter (Signed)
please skip 1 day of tresiba, then please resume at 50 units qd.

## 2017-11-22 NOTE — Telephone Encounter (Signed)
Is pt on the V-GO yet?

## 2017-11-24 NOTE — Telephone Encounter (Signed)
Set up an appt. For V-Go start on Monday.  Meanwhile, she [facility] to [facility] the [facility].  Free sensor given to her Last Wednesday, with training on how to use this.  Paperwork sent in for insurance investigation for [employer].

## 2017-11-25 ENCOUNTER — Encounter: Payer: BLUE CROSS/BLUE SHIELD | Admitting: Nutrition

## 2017-11-25 DIAGNOSIS — Z713 Dietary counseling and surveillance: Secondary | ICD-10-CM | POA: Diagnosis not present

## 2017-11-25 DIAGNOSIS — E119 Type 2 diabetes mellitus without complications: Secondary | ICD-10-CM

## 2017-11-25 DIAGNOSIS — E1142 Type 2 diabetes mellitus with diabetic polyneuropathy: Secondary | ICD-10-CM | POA: Diagnosis not present

## 2017-11-25 DIAGNOSIS — Z794 Long term (current) use of insulin: Secondary | ICD-10-CM | POA: Diagnosis not present

## 2017-11-26 ENCOUNTER — Telehealth: Payer: Self-pay | Admitting: Nutrition

## 2017-11-26 NOTE — Telephone Encounter (Signed)
Thank you please call patient: Please d/c glimepiride Please continue the same V-Go without boluses. Please call or message me in a few days, to tell us how the blood sugar is doing

## 2017-11-26 NOTE — Telephone Encounter (Signed)
Patient started V-go 40 at 10:30 AM yesteerday with no long acting insulin since last Friday, and no Novolog yesterday  Blood sugar was 322x mg.   ACL  156  1 hour later,(  Then ate lunch-drinking regular sodas) 2hr. pcL 194  (11:30AM)  (No bolusing) 8PM  18m. with arrow pointing straight down.  Ate peanut                      butter and jelly sandwhich with regular soda. 10PM: 1044m1AM: 173 FBS today:  98  Drank Dr. PeMalachi Bondshr. After soda: 124    I gave her a V-Go 30 starter kit as well to take home, in case you want her to change to this.

## 2017-11-26 NOTE — Telephone Encounter (Signed)
I told patient to stop the glimepiride as below.  Said blood sugar went to 279 after lunch and gave 2u bolus.  Stressed need not not do this, because now (1 hour later, blood sugar is now 179 )  Told her that she will need to monitor closely her blood sugars, because it will continue to drop for 3 more hours.  She reported good understanding of this, and again was told to not give any boluses.

## 2017-11-26 NOTE — Progress Notes (Signed)
Patient was trained on how to fill apply and use the V-go.  She was given a V-go 40 start kit with directions of [place] and the toll free telephone number to call if she has questions.   S he filled a V-go with Novolog insulin, and applied it to her upper left abdomen without difficulty.   She was told to stop her tresiba, and she reported good understanding of this.   She was told t take no meal time insulin at this time, and to test her blood sugar before meals and at bedtime.  She has a [employer] and agreed to scan this before meals and at bedtime. Breakfast is sweetened oatmeal, and regular Dr. Malachi Bonds.  She eats no lunch, but sips of another can of Dr. Malachi Bonds during the day.  Supper is 6-7, and will occasionally drink another Dr. Malachi Bonds, but is trying to drink Gatorade O instead.   I will call her on Wednesday to see how she is doing.   She had no final questions.

## 2017-11-26 NOTE — Patient Instructions (Signed)
Fill and apply a new V-go every 24 hours. Take no meal time insulin Stop the Tresiba Test blood sugar before meals and at bedtime.

## 2017-11-28 ENCOUNTER — Other Ambulatory Visit: Payer: Self-pay | Admitting: Urgent Care

## 2017-11-28 DIAGNOSIS — J9801 Acute bronchospasm: Secondary | ICD-10-CM

## 2017-12-01 ENCOUNTER — Emergency Department (HOSPITAL_COMMUNITY)
Admission: EM | Admit: 2017-12-01 | Discharge: 2017-12-02 | Disposition: A | Discharge status: Home / Self Care | Payer: BLUE CROSS/BLUE SHIELD | Attending: Emergency Medicine | Admitting: Emergency Medicine

## 2017-12-01 ENCOUNTER — Encounter (HOSPITAL_COMMUNITY): Payer: Self-pay | Admitting: *Deleted

## 2017-12-01 ENCOUNTER — Emergency Department (HOSPITAL_COMMUNITY): Payer: BLUE CROSS/BLUE SHIELD

## 2017-12-01 ENCOUNTER — Other Ambulatory Visit: Payer: Self-pay

## 2017-12-01 DIAGNOSIS — E119 Type 2 diabetes mellitus without complications: Secondary | ICD-10-CM | POA: Insufficient documentation

## 2017-12-01 DIAGNOSIS — Z91040 Latex allergy status: Secondary | ICD-10-CM | POA: Insufficient documentation

## 2017-12-01 DIAGNOSIS — Z794 Long term (current) use of insulin: Secondary | ICD-10-CM | POA: Diagnosis not present

## 2017-12-01 DIAGNOSIS — Z7982 Long term (current) use of aspirin: Secondary | ICD-10-CM | POA: Diagnosis not present

## 2017-12-01 DIAGNOSIS — F17210 Nicotine dependence, cigarettes, uncomplicated: Secondary | ICD-10-CM | POA: Insufficient documentation

## 2017-12-01 DIAGNOSIS — R0789 Other chest pain: Secondary | ICD-10-CM

## 2017-12-01 DIAGNOSIS — Z79899 Other long term (current) drug therapy: Secondary | ICD-10-CM | POA: Insufficient documentation

## 2017-12-01 LAB — BASIC METABOLIC PANEL
ANION GAP: 12 (ref 5–15)
BUN: 9 mg/dL (ref 6–20)
CALCIUM: 8.4 mg/dL — AB (ref 8.9–10.3)
CO2: 24 mmol/L (ref 22–32)
Chloride: 102 mmol/L (ref 98–111)
Creatinine, Ser: 0.97 mg/dL (ref 0.44–1.00)
GFR calc Af Amer: >60 mL/min (ref >60)
Glucose, Bld: 196 mg/dL — ABNORMAL HIGH (ref 70–99)
Potassium: 5 mmol/L (ref 3.5–5.1)
SODIUM: 138 mmol/L (ref 135–145)

## 2017-12-01 LAB — CBC
HCT: 47.6 % — ABNORMAL HIGH (ref 36.0–46.0)
HEMOGLOBIN: 15.3 g/dL — AB (ref 12.0–15.0)
MCH: 28.1 pg (ref 26.0–34.0)
MCHC: 32.1 g/dL (ref 30.0–36.0)
MCV: 87.5 fL (ref 78.0–100.0)
Platelets: 375 10*3/uL (ref 150–400)
RBC: 5.44 MIL/uL — ABNORMAL HIGH (ref 3.87–5.11)
RDW: 15.9 % — AB (ref 11.5–15.5)
WBC: 11 10*3/uL — AB (ref 4.0–10.5)

## 2017-12-01 LAB — I-STAT BETA HCG BLOOD, ED (MC, WL, AP ONLY): HCG, QUANTITATIVE: 6.5 m[IU]/mL — AB (ref <5)

## 2017-12-01 LAB — I-STAT TROPONIN, ED: TROPONIN I, POC: 0.01 ng/mL (ref 0.00–0.08)

## 2017-12-01 MED ORDER — LORAZEPAM 2 MG/ML IJ SOLN
1.0000 mg | Freq: Once | INTRAMUSCULAR | Status: AC
  Administered 2017-12-01: 1 mg via INTRAVENOUS
  Filled 2017-12-01: qty 1

## 2017-12-01 MED ORDER — NITROGLYCERIN 0.4 MG SL SUBL
0.4000 mg | SUBLINGUAL_TABLET | SUBLINGUAL | Status: DC | PRN
  Administered 2017-12-01: 0.4 mg via SUBLINGUAL
  Filled 2017-12-01: qty 1

## 2017-12-01 NOTE — ED Provider Notes (Signed)
[facility] EMERGENCY DEPARTMENT Provider Note   CSN: [id number] Arrival date & time: 12/01/17  1818     History   Chief Complaint Chief Complaint  Patient presents with  . Torticollis    HPI Kristie Tapia is a 47 y.o. female.  Patient complains of tingling and tightness in her neck.  No shortness of breath  The history is provided by the patient. No language interpreter was used.  Chest Pain   This is a new problem. The current episode started 1 to 2 hours ago. The problem occurs rarely. The problem has been resolved. The pain is associated with rest. Pain location: Patient in the neck. The pain is at a severity of 3/10. The pain is mild. The quality of the pain is described as dull. The pain radiates to the left jaw. Pertinent negatives include no abdominal pain, no back pain, no cough and no headaches.  Pertinent negatives for past medical history include no seizures.    Past Medical History:  Diagnosis Date  . Anemia   . Anxiety   . Complication of anesthesia ` 1998   "lungs collapsed during LEEP procedure"  . Gallstones   . GERD (gastroesophageal reflux disease)   . H/O hiatal hernia   . History of stomach ulcers   . Migraines   . Sleep apnea   . Type II diabetes mellitus [facility])     Patient Active Problem List   Diagnosis Date Noted  . Smoker unmotivated to quit 07/07/2017  . Chronic GERD 07/07/2017  . Chronic anxiety 07/07/2017  . Dyslipidemia 02/24/2017  . Diabetes ([place]) 02/24/2017  . Headache 02/24/2017    Past Surgical History:  Procedure Laterality Date  . ABDOMINAL EXPLORATION SURGERY     "I've had several"  . ABDOMINAL HYSTERECTOMY  08/1998  . APPENDECTOMY  01/03/2012  . DILATION AND CURETTAGE OF UTERUS    . LAPAROSCOPIC APPENDECTOMY  01/03/2012   Procedure: APPENDECTOMY LAPAROSCOPIC;  Surgeon: Edward Jolly, MD;  Location: [facility];  Service: General;  Laterality: N/A;  . LEEP  1998   "lungs collapsed"  . TUBAL LIGATION   05/1998     OB History   None      Home Medications    Prior to Admission medications   Medication Sig Start Date End Date Taking? Authorizing Provider  aspirin EC 81 MG tablet Take 1 tablet (81 mg total) by mouth daily. 07/06/17   Tereasa Coop, PA-C  Continuous Blood Gluc Sensor (FREESTYLE LIBRE 14 DAY SENSOR) MISC 1 each by Does not apply route every 14 (fourteen) days. 11/21/17   Philemon Kingdom, MD  escitalopram (LEXAPRO) 20 MG tablet Take 1 tablet (20 mg total) by mouth daily. 07/06/17   Tereasa Coop, PA-C  fluticasone (FLONASE) 50 MCG/ACT nasal spray Place 2 sprays into both nostrils daily. 07/06/17   Tereasa Coop, PA-C  furosemide (LASIX) 20 MG tablet Take 0.5 tablets (10 mg total) by mouth daily. For relief of swelling. 10/12/17   Tereasa Coop, PA-C  gabapentin (NEURONTIN) 300 MG capsule Take 2 capsules (600 mg total) by mouth 3 (three) times daily. 07/06/17   Tereasa Coop, PA-C  glimepiride (AMARYL) 2 MG tablet Take 1 tablet (2 mg total) by mouth daily before breakfast. 11/04/17   Renato Shin, MD  insulin degludec (TRESIBA FLEXTOUCH) 100 UNIT/ML SOPN FlexTouch Pen Inject 0.7 mLs (70 Units total) into the skin daily. And pen needles 1/day 07/19/17   Renato Shin, MD  Insulin  Disposable Pump (V-GO 40) KIT 1 Device by Does not apply route daily. 11/14/17   Renato Shin, MD  LORazepam (ATIVAN) 1 MG tablet Take 1 tablet (1 mg total) by mouth 2 (two) times daily as needed for anxiety (For acute panic only. No more than 60 tabs in one year from 07/06/17). 07/06/17   Tereasa Coop, PA-C  metoCLOPramide (REGLAN) 10 MG tablet Take 1 tablet (10 mg total) by mouth every 6 (six) hours as needed for nausea. 06/12/17   [name], DO  nicotine polacrilex (NICORELIEF) 2 MG gum Take 1 each (2 mg total) by mouth as needed for smoking cessation. 10/12/17   Tereasa Coop, PA-C  NOVOLOG 100 UNIT/ML injection For use in pump, for a total of 40 units per day 11/14/17   Renato Shin, MD    pantoprazole (PROTONIX) 40 MG tablet  08/12/17   [provider]  promethazine (PHENERGAN) 25 MG tablet  08/05/17   [provider]  rosuvastatin (CRESTOR) 40 MG tablet Take 1 tablet (40 mg total) by mouth daily. 07/07/17   Tereasa Coop, PA-C  topiramate ER (QUDEXY XR) CS24 sprinkle cap Take 3 capsules (75 mg total) by mouth daily. 09/13/17   [name], DO  traZODone (DESYREL) 100 MG tablet Take 1 tablet (100 mg total) by mouth at bedtime. 07/06/17   Tereasa Coop, PA-C    Family History Family History  Problem Relation Age of Onset  . Breast cancer Unknown   . Diabetes Paternal Grandfather     Social History Social History   Tobacco Use  . Smoking status: Current Every Day Smoker    Packs/day: 1.00    Years: 29.00    Pack years: 29.00    Types: Cigarettes    Last attempt to quit: 03/05/2017    Years since quitting: 0.7  . Smokeless tobacco: Never Used  . Tobacco comment: has smoked since age 89  Substance Use Topics  . Alcohol use: No    Comment: hasn't  had s drink in over 5 yrs  . Drug use: No     Allergies   Adhesive [tape]; Sulfa antibiotics; Decongestant [pseudoephedrine hcl er]; Prednisone; Percocet [oxycodone-acetaminophen]; Zoloft [sertraline hcl]; and Latex   Review of Systems Review of Systems  Constitutional: Negative for appetite change and fatigue.  HENT: Negative for congestion, ear discharge and sinus pressure.   Eyes: Negative for discharge.  Respiratory: Negative for cough.   Cardiovascular: Positive for chest pain.  Gastrointestinal: Negative for abdominal pain and diarrhea.  Genitourinary: Negative for frequency and hematuria.  Musculoskeletal: Negative for back pain.  Skin: Negative for rash.  Neurological: Negative for seizures and headaches.  Psychiatric/Behavioral: Negative for hallucinations.     Physical Exam Updated Vital Signs BP 110/60   Pulse 74   Temp 98.5 F (36.9 C)   Resp 20   Ht 5' 5.5" (1.664 m)    Wt 86.6 kg (191 lb)   SpO2 97%   BMI 31.30 kg/m   Physical Exam  Constitutional: She is oriented to person, place, and time. She appears well-developed.  HENT:  Head: Normocephalic.  Eyes: Conjunctivae and EOM are normal. No scleral icterus.  Neck: Neck supple. No thyromegaly present.  Cardiovascular: Normal rate and regular rhythm. Exam reveals no gallop and no friction rub.  No murmur heard. Pulmonary/Chest: No stridor. She has no wheezes. She has no rales. She exhibits no tenderness.  Abdominal: She exhibits no distension. There is no tenderness. There is  no rebound.  Musculoskeletal: Normal range of motion. She exhibits no edema.  Lymphadenopathy:    She has no cervical adenopathy.  Neurological: She is oriented to person, place, and time. She exhibits normal muscle tone. Coordination normal.  Skin: No rash noted. No erythema.  Psychiatric: She has a normal mood and affect. Her behavior is normal.     ED Treatments / Results  Labs (all labs ordered are listed, but only abnormal results are displayed) Labs Reviewed  BASIC METABOLIC PANEL - Abnormal; Notable for the following components:      Result Value   Glucose, Bld 196 (*)    Calcium 8.4 (*)    All other components within normal limits  CBC - Abnormal; Notable for the following components:   WBC 11.0 (*)    RBC 5.44 (*)    Hemoglobin 15.3 (*)    HCT 47.6 (*)    RDW 15.9 (*)    All other components within normal limits  I-STAT BETA HCG BLOOD, ED ([facility], [facility], [facility] ONLY) - Abnormal; Notable for the following components:   I-stat hCG, quantitative 6.5 (*)    All other components within normal limits  I-STAT TROPONIN, ED    EKG EKG Interpretation  Date/Time:  Sunday December 01 2017 20:20:57 EDT Ventricular Rate:  60 PR Interval:    QRS Duration: 89 QT Interval:  464 QTC Calculation: 464 R Axis:   48 Text Interpretation:  Sinus rhythm Confirmed by Milton Ferguson [phone]) on 12/01/2017 8:51:44 PM Also confirmed by Milton Ferguson [phone])  on 12/01/2017 9:16:39 PM   Radiology Dg Chest 2 View  Result Date: 12/01/2017 CLINICAL DATA:  47 year old female with chest pressure. EXAM: CHEST - 2 VIEW COMPARISON:  Chest radiograph dated 08/03/2015 FINDINGS: The heart size and mediastinal contours are within normal limits. Both lungs are clear. The visualized skeletal structures are unremarkable. IMPRESSION: No active cardiopulmonary disease. Electronically Signed   By: Anner Crete M.D.   On: 12/01/2017 21:08    Procedures Procedures (including critical care time)  Medications Ordered in ED Medications  nitroGLYCERIN (NITROSTAT) SL tablet 0.4 mg (0.4 mg Sublingual Given 12/01/17 2146)  LORazepam (ATIVAN) injection 1 mg (1 mg Intravenous Given 12/01/17 2157)     Initial Impression / Assessment and Plan / ED Course  I have reviewed the triage vital signs and the nursing notes.  Pertinent labs & imaging results that were available during my care of the patient were reviewed by me and considered in my medical decision making (see chart for details).    Labs including CBC troponin chest x-ray and EKG unremarkable.  Doubt coronary artery disease.  Patient will get a second troponin.  If that is negative she will be discharged home to follow-up with her PCP    Final Clinical Impressions(s) / ED Diagnoses   Final diagnoses:  Atypical chest pain    ED Discharge Orders    None       Milton Ferguson, MD 12/01/17 2215

## 2017-12-01 NOTE — ED Triage Notes (Signed)
approx 1330 today she had pain in the back of her  Neck up into her head and down both arms  From  Her eyes down to her lower chest tingling both arms  Headache  A few minutes ago

## 2017-12-01 NOTE — ED Triage Notes (Signed)
She has a device in her lt upper arm and she checks her blood sugar  With that she checked it here and it was 137

## 2017-12-01 NOTE — Discharge Instructions (Signed)
Take your Ativan 1 pill twice a day if needed.  Follow-up with your family doctor in a week for recheck

## 2017-12-02 ENCOUNTER — Telehealth: Payer: Self-pay

## 2017-12-02 LAB — I-STAT TROPONIN, ED: TROPONIN I, POC: 0 ng/mL (ref 0.00–0.08)

## 2017-12-02 NOTE — Telephone Encounter (Signed)
Rcvd VM from Pt. She was seen in the ED and states they kept saying it was her heart and anxiety, but she felt like it could have been stroke related. She states she felt a lot of pressure and numbness in her head, chest and neck. She said it felt like she had on an extremely tight turtle neck.    Called and spoke with Pt. I advised her the call back will be tomorrow.

## 2017-12-02 NOTE — ED Provider Notes (Signed)
Patient signed out at end of shift by Dr. Estell HarpinZammit pending a delta troponin for evaluation of chest pain. Work up for cardiac event negative and delta trop obtained to complete evaluation.   On recheck, the patient is sleeping and is comfortable. The second troponin is negative. She can be discharged home per plan of previous treatment team.    Elpidio AnisUpstill, Nicholson Starace, PA-C 12/02/17 [id number]    Bethann BerkshireZammit, Joseph, MD 12/05/17 1005

## 2017-12-02 NOTE — Telephone Encounter (Signed)
It doesn't sound like a stroke to me

## 2017-12-03 NOTE — Telephone Encounter (Signed)
Called Pt and advised her.

## 2017-12-11 ENCOUNTER — Other Ambulatory Visit: Payer: Self-pay

## 2017-12-11 ENCOUNTER — Ambulatory Visit (INDEPENDENT_AMBULATORY_CARE_PROVIDER_SITE_OTHER): Payer: BLUE CROSS/BLUE SHIELD | Admitting: Physician Assistant

## 2017-12-11 ENCOUNTER — Encounter: Payer: Self-pay | Admitting: Physician Assistant

## 2017-12-11 VITALS — BP 121/79 | HR 83 | Temp 98.0°F | Resp 16 | Ht 65.51 in | Wt 189.2 lb

## 2017-12-11 DIAGNOSIS — F17210 Nicotine dependence, cigarettes, uncomplicated: Secondary | ICD-10-CM

## 2017-12-11 DIAGNOSIS — F419 Anxiety disorder, unspecified: Secondary | ICD-10-CM | POA: Diagnosis not present

## 2017-12-11 DIAGNOSIS — G243 Spasmodic torticollis: Secondary | ICD-10-CM

## 2017-12-11 MED ORDER — TIZANIDINE HCL 4 MG PO TABS: 4.0000 mg | ORAL_TABLET | Freq: Four times a day (QID) | ORAL | 1 refills | Status: DC | PRN

## 2017-12-11 MED ORDER — TRAZODONE HCL 100 MG PO TABS: 100.0000 mg | ORAL_TABLET | Freq: Every day | ORAL | 11 refills | Status: DC

## 2017-12-11 NOTE — Progress Notes (Signed)
12/11/2017 2:40 PM   DOB: Nov 14, 1970 / MRN: [id number]  SUBJECTIVE:  Kristie Tapia is a 47 y.o. female presenting for follow up of atypical chest pain. Symptoms present for on 6/30 and evaled at the ED and negative cardiac enzymes after cycling.  The problem is somewhat better but still present. . She has tried nitroglycerin. This started after "looking on a roof." Tells me that when she stopped this activity her neck became tight and this spread to her chest and face.  She was given nitroglycerin at the ED and this made her feels as if she may "blow up."  She shortly there after received ativan and abruptly lost consciousness.  She does not really remember being discharged. She was worried that she was having a stroke.      She is allergic to adhesive [tape]; sulfa antibiotics; decongestant [pseudoephedrine hcl er]; prednisone; percocet [oxycodone-acetaminophen]; zoloft [sertraline hcl]; and latex.   She  has a past medical history of Anemia, Anxiety, Complication of anesthesia (` 1998), Gallstones, GERD (gastroesophageal reflux disease), H/O hiatal hernia, History of stomach ulcers, Migraines, Sleep apnea, and Type II diabetes mellitus (HCC).    She  reports that she has been smoking cigarettes.  She has a 29.00 pack-year smoking history. She has never used smokeless tobacco. She reports that she does not drink alcohol or use drugs. She  reports that she currently engages in sexual activity. The patient  has a past surgical history that includes Appendectomy (01/03/2012); Abdominal hysterectomy (08/1998); Dilation and curettage of uterus; Abdominal exploration surgery; Tubal ligation (05/1998); LEEP (1998); and laparoscopic appendectomy (01/03/2012).  Her family history includes Breast cancer in her unknown relative; Diabetes in her paternal grandfather.  Review of Systems  Constitutional: Negative for chills, diaphoresis and fever.  Respiratory: Negative for shortness of breath.     Cardiovascular: Negative for orthopnea and leg swelling.  Gastrointestinal: Negative for nausea.  Skin: Negative for rash.  Neurological: Negative for dizziness, tingling, sensory change, seizures, loss of consciousness and weakness.    The problem list and medications were reviewed and updated by myself where necessary and exist elsewhere in the encounter.   OBJECTIVE:  BP 121/79   Pulse 83   Temp 98 F (36.7 C)   Resp 16   Ht 5' 5.51" (1.664 m)   Wt 189 lb 3.2 oz (85.8 kg)   SpO2 95%   BMI 30.99 kg/m   Wt Readings from Last 3 Encounters:  12/11/17 189 lb 3.2 oz (85.8 kg)  12/01/17 191 lb (86.6 kg)  11/18/17 191 lb 6.4 oz (86.8 kg)   Temp Readings from Last 3 Encounters:  12/11/17 98 F (36.7 C)  12/02/17 98.6 F (37 C) (Oral)  11/18/17 98.1 F (36.7 C) (Oral)   BP Readings from Last 3 Encounters:  12/11/17 121/79  12/02/17 100/63  11/18/17 120/78   Pulse Readings from Last 3 Encounters:  12/11/17 83  12/02/17 63  11/18/17 77    Physical Exam  Constitutional: She is oriented to person, place, and time.  HENT:  Right Ear: Hearing, tympanic membrane, external ear and ear canal normal.  Left Ear: Hearing, tympanic membrane, external ear and ear canal normal.  Nose: Nose normal. Right sinus exhibits no maxillary sinus tenderness and no frontal sinus tenderness. Left sinus exhibits no maxillary sinus tenderness and no frontal sinus tenderness.  Mouth/Throat: Uvula is midline and mucous membranes are normal. Mucous membranes are not dry. No posterior oropharyngeal edema or tonsillar abscesses.  Cardiovascular: Normal  rate, regular rhythm, normal heart sounds and intact distal pulses.  Pulmonary/Chest: Effort normal and breath sounds normal.  Musculoskeletal: Normal range of motion.  Lymphadenopathy:       Head (right side): No submandibular and no tonsillar adenopathy present.       Head (left side): No submandibular and no tonsillar adenopathy present.    She  has no cervical adenopathy.  Neurological: She is alert and oriented to person, place, and time. She has normal reflexes. She is not disoriented. She displays no atrophy, no tremor and normal reflexes. No cranial nerve deficit or sensory deficit. She exhibits normal muscle tone. She displays a negative Romberg sign. She displays no seizure activity. Coordination and gait normal. GCS eye subscore is 4. GCS verbal subscore is 5. GCS motor subscore is 6.  Tandem walking and FTN intact to challenge.     Lab Results  Component Value Date   HGBA1C 9.8 10/18/2017    Lab Results  Component Value Date   WBC 11.0 (H) 12/01/2017   HGB 15.3 (H) 12/01/2017   HCT 47.6 (H) 12/01/2017   MCV 87.5 12/01/2017   PLT 375 12/01/2017    Lab Results  Component Value Date   CREATININE 0.97 12/01/2017   BUN 9 12/01/2017   NA 138 12/01/2017   K 5.0 12/01/2017   CL 102 12/01/2017   CO2 24 12/01/2017    Lab Results  Component Value Date   ALT 15 02/02/2016   AST 12 02/02/2016   ALKPHOS 82 02/02/2016   BILITOT 0.6 02/02/2016    Lab Results  Component Value Date   TSH 3.14 06/12/2017    Lab Results  Component Value Date   CHOL 96 (L) 07/06/2017   HDL 39 (L) 07/06/2017   LDLCALC 34 07/06/2017   TRIG 114 07/06/2017   CHOLHDL 2.5 07/06/2017     ASSESSMENT AND PLAN:  Kristie Tapia was seen today for er visit.  Diagnoses and all orders for this visit:  Torticollis, spasmodic Comments: Resolving.  Orders: -     tiZANidine (ZANAFLEX) 4 MG tablet; Take 1 tablet (4 mg total) by mouth every 6 (six) hours as needed for muscle spasms.  Chronic anxiety -     traZODone (DESYREL) 100 MG tablet; Take 1-1.5 tablets (100-150 mg total) by mouth at bedtime.    The patient is advised to call or return to clinic if she does not see an improvement in symptoms, or to seek the care of the closest emergency department if she worsens with the above plan.   Deliah BostonMichael Westlynn Fifer, MHS, PA-C [facility] at [facility] 12/11/2017 2:40 PM

## 2017-12-11 NOTE — Patient Instructions (Addendum)
Don't mix your muslce relaxer with Lorazepam or your Ativan/Lorazepam.      IF you received an x-ray today, you will receive an invoice from [facility]. Please contact [facility] at [phone] with questions or concerns regarding your invoice.   IF you received labwork today, you will receive an invoice from [facility]. Please contact [facility] at [phone] with questions or concerns regarding your invoice.   Our billing staff will not be able to assist you with questions regarding bills from these companies.  You will be contacted with the lab results as soon as they are available. The fastest way to get your results is to activate your My Chart account. Instructions are located on the last page of this paperwork. If you have not heard from us regarding the results in 2 weeks, please contact this office.

## 2017-12-19 ENCOUNTER — Ambulatory Visit (INDEPENDENT_AMBULATORY_CARE_PROVIDER_SITE_OTHER): Payer: BLUE CROSS/BLUE SHIELD | Admitting: Neurology

## 2017-12-19 ENCOUNTER — Encounter: Payer: Self-pay | Admitting: Neurology

## 2017-12-19 VITALS — BP 118/68 | HR 74 | Ht 64.5 in | Wt 192.0 lb

## 2017-12-19 DIAGNOSIS — G44221 Chronic tension-type headache, intractable: Secondary | ICD-10-CM | POA: Diagnosis not present

## 2017-12-19 DIAGNOSIS — G3184 Mild cognitive impairment, so stated: Secondary | ICD-10-CM

## 2017-12-19 DIAGNOSIS — I679 Cerebrovascular disease, unspecified: Secondary | ICD-10-CM

## 2017-12-19 DIAGNOSIS — G43001 Migraine without aura, not intractable, with status migrainosus: Secondary | ICD-10-CM

## 2017-12-19 DIAGNOSIS — G4485 Primary stabbing headache: Secondary | ICD-10-CM | POA: Diagnosis not present

## 2017-12-19 DIAGNOSIS — F17200 Nicotine dependence, unspecified, uncomplicated: Secondary | ICD-10-CM

## 2017-12-19 MED ORDER — TOPIRAMATE ER 100 MG PO SPRINKLE CAP24: 100.0000 mg | EXTENDED_RELEASE_CAPSULE | Freq: Every day | ORAL | 3 refills | Status: DC

## 2017-12-19 NOTE — Patient Instructions (Addendum)
1.  Increase topiramate ER to 100mg  at bedtime.  If headaches not improved in 6 weeks, contact me  2.  Continue aspirin 81mg  daily 3.  Try to work on quitting smoking 4.  Continue working on improving diabetes 5.  Check carotid doppler 6.  Follow up in 4 months.  We have sent an order for an ultrasound of your carotid arteries to [facility]. If you need to contact them directly, their number is [phone].

## 2017-12-19 NOTE — Progress Notes (Signed)
NEUROLOGY FOLLOW UP OFFICE NOTE  Tanysha Quant [id number]  HISTORY OF PRESENT ILLNESS: Kristie Tapia is a 47 year old female with type 2 diabetes, hypertension, tobacco use disorder, fibromyalgia, and anxiety who follows up for migraine.   UPDATE: She is on topiramate ER 65m daily.  Aimovig was ineffective.  She has had increase in primary stabbing headaches.   I Migraine: Intensity:  7-8/10 Duration:  All day Frequency:  1 to 2 a month   II  Primary Stabbing Headache Intensity:  Severe Duration:  20 seconds Frequency:  4 to 5 days a week   III Bi-temporal headache/tension type: Intensity:  Moderate Duration:  All day Frequency:  DDuffy Bruce  Frequency of abortive medication: almost daily Current NSAIDS:  ASA 871mdaily Current analgesics:  Excedrin Migraine and Tension Current triptans:  no Current anti-emetic:  promethazine 2557mReglan (rarely) Current muscle relaxants:  no Current anti-anxiolytic:  no Current sleep aide:  trazodone Current Antihypertensive medications:  no Current Antidepressant medications:  Lexapro 69m34mrrent Anticonvulsant medications:  Topiramate ER 75mg63mbapentin 900mg 56me daily Current anti-CGRP:  no Current Vitamins/Herbal/Supplements:  Omega 3 Current Antihistamines/Decongestants:  Flonase Other therapy:  No    Caffeine:  Dr. PepperMalachi Bondst tea Smoker:  yes Diet:  Dr. PepperMalachi Bondst tea.  No water. Exercise:  no Depression/anxiety:  anxiety Sleep hygiene:  Poor  IV  Memory deficits: She underwent neuropsychological testing on 10/30/17.  Results demonstrated frontal-subcortical dysfunction consistent with vascular cognitive impairment (cerebrovascular disease on brain imaging, uncontrolled diabetes, tobacco use).   HISTORY:  Onset:  6th grade I  Migraines: Location:  Bi-temporal/parietal and travels to occiput and back of neck Quality:  pressure Initial Intensity:  7-8/10 Aura:  Squiggly lines in vision of one or both eyes for  30 minutes prior to onset of headache and then resolves. Prodrome:  no Associated symptoms:  Nausea, photophobia, phonopohobia.  She has not had any new worse headache of her life, waking up from sleep Initial Duration:  All day (used to respond to sumatriptan 50mg i63mhour) Initial Frequency:  Varies.  Some months it may be daily, sometimes once a week Initial Frequency of abortive medication: 1 to 2 days a week Triggers/exacerbating factors:  stress Relieving factors:  sleep Activity:  Aggravates   II  She also has primary stabbing headaches that last 20 seconds III  She also has bilateral temporal headaches as well.   Past NSAIDS:  ibuprofen, naproxen, Toradol injection (ineffective), Cambia (unable to tolerate) Past analgesics:  tramadol, oxycodone Past abortive triptans:  sumatriptan 100mg, M28mt 5mg (con70mindicated due to MRI brain evidence of ischemia. Past muscle relaxants:  Robaxin, tizanidine, Skelaxin Past anti-emetic:  Zofran Past antihypertensive medications:  no Past antidepressant medications:  Nortriptyline 25mg (sid17mfects) Past anticonvulsant medications:  topiramate (helped in conjunction with other medications that she can't remember) Past anti-CGRP:  Aimovig Past vitamins/Herbal/Supplements:  no Past antihistamines/decongestants:  no Other past therapies:  no   Family history of headache:  no   Memory:  Since 2018, she reports worsening memory.  She frequently forgets why she walked into a room.  [facility]  [place] what she wanted to say during conversation.  She denies family history of dementia.  B12 was 442 and TSH was 3.14.     To assess right sided mandibular pain, an X-ray of the mandible was performed on 07/14/16 and revealed significant  dental disease in the right mandibular molars.   She had an MRI of the brain without contrast on 05/01/13 at an outside facility that  demonstrated an ovoid area of hyperattenuation on T2 weighted images within the right thalamus measuring less than 5 mm in diameter, as well as punctate are of signal hyperintensity within the right superior temporal lobe at the gray-white junction and a punctate hyperintense focus and a linear juxtacortical lesion within the right frontal lobe concerning for demyelination.  Follow up MRI with contrast from 05/09/13 revealed no enhancement.  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  Based on these findings, we discontinued triptans.  PAST MEDICAL HISTORY: Past Medical History:  Diagnosis Date  . Anemia   . Anxiety   . Complication of anesthesia ` 1998   "lungs collapsed during LEEP procedure"  . Gallstones   . GERD (gastroesophageal reflux disease)   . H/O hiatal hernia   . History of stomach ulcers   . Migraines   . Sleep apnea   . Type II diabetes mellitus (HCC)     MEDICATIONS: Current Outpatient Medications on File Prior to Visit  Medication Sig Dispense Refill  . aspirin EC 81 MG tablet Take 1 tablet (81 mg total) by mouth daily. 365 tablet 0  . Continuous Blood Gluc Sensor (FREESTYLE LIBRE 14 DAY SENSOR) MISC 1 each by Does not apply route every 14 (fourteen) days. 2 each 2  . escitalopram (LEXAPRO) 20 MG tablet Take 1 tablet (20 mg total) by mouth daily. 90 tablet 3  . fluticasone (FLONASE) 50 MCG/ACT nasal spray Place 2 sprays into both nostrils daily. 16 g 6  .  furosemide (LASIX) 20 MG tablet Take 0.5 tablets (10 mg total) by mouth daily. For relief of swelling. 90 tablet 0  . gabapentin (NEURONTIN) 300 MG capsule Take 2 capsules (600 mg total) by mouth 3 (three) times daily. 540 capsule 1  . glimepiride (AMARYL) 2 MG tablet Take 1 tablet (2 mg total) by mouth daily before breakfast. 30 tablet 3  . insulin degludec (TRESIBA FLEXTOUCH) 100 UNIT/ML SOPN FlexTouch Pen Inject 0.7 mLs (70 Units total) into the skin daily. And pen needles 1/day (Patient not taking: Reported on 12/11/2017) 10 pen 11  . Insulin Disposable Pump (V-GO 40) KIT 1 Device by Does not apply route daily. 30 kit 11  . LORazepam (ATIVAN) 1 MG tablet Take 1 tablet (1 mg total) by mouth 2 (two) times daily as needed for anxiety (For acute panic only. No more than 60 tabs in one year from 07/06/17). 20 tablet 2  . metoCLOPramide (REGLAN) 10 MG tablet Take 1 tablet (10 mg total) by mouth every 6 (six) hours as needed for nausea. 25 tablet 2  . nicotine polacrilex (NICORELIEF) 2 MG gum Take 1 each (2 mg total) by mouth as needed for smoking cessation. (Patient not taking: Reported on 12/11/2017) 100 tablet 11  . NOVOLOG 100 UNIT/ML injection For use in pump, for a total of 40 units  per day 20 mL 3  . pantoprazole (PROTONIX) 40 MG tablet   1  . promethazine (PHENERGAN) 25 MG tablet   0  . rosuvastatin (CRESTOR) 40 MG tablet Take 1 tablet (40 mg total) by mouth daily. 90 tablet 3  . tiZANidine (ZANAFLEX) 4 MG tablet Take 1 tablet (4 mg total) by mouth every 6 (six) hours as needed for muscle spasms. 30 tablet 1  . traZODone (DESYREL) 100 MG tablet Take 1-1.5 tablets (100-150 mg total) by mouth at bedtime. 30 tablet 11   No current facility-administered medications on file prior to visit.     ALLERGIES: Allergies  Allergen Reactions  . Adhesive [Tape] Other (See Comments)    Blisters USE PAPER TAPE ONLY  . Sulfa Antibiotics Other (See Comments)    Burns the inside of my mouth; "blisters; leaves  tongue and inside of mouth solid red"  . Decongestant [Pseudoephedrine Hcl Er] Itching    ALL DECONGESTANTS  . Prednisone Other (See Comments)    Gives me a bad attitude  . Percocet [Oxycodone-Acetaminophen] Other (See Comments)    PT reports having nightmares when taking Percocet  . Zoloft [Sertraline Hcl]   . Latex Itching    FAMILY HISTORY: Family History  Problem Relation Age of Onset  . Breast cancer Unknown   . Diabetes Paternal Grandfather     SOCIAL HISTORY: Social History   Socioeconomic History  . Marital status: Married    Spouse name: Not on file  . Number of children: Not on file  . Years of education: Not on file  . Highest education level: Not on file  Occupational History  . Not on file  Social Needs  . Financial resource strain: Not on file  . Food insecurity:    Worry: Not on file    Inability: Not on file  . Transportation needs:    Medical: Not on file    Non-medical: Not on file  Tobacco Use  . Smoking status: Current Every Day Smoker    Packs/day: 1.00    Years: 29.00    Pack years: 29.00    Types: Cigarettes    Last attempt to quit: 03/05/2017    Years since quitting: 0.7  . Smokeless tobacco: Never Used  . Tobacco comment: has smoked since age 43  Substance and Sexual Activity  . Alcohol use: No    Comment: hasn't  had s drink in over 5 yrs  . Drug use: No  . Sexual activity: Yes  Lifestyle  . Physical activity:    Days per week: Not on file    Minutes per session: Not on file  . Stress: Not on file  Relationships  . Social connections:    Talks on phone: Not on file    Gets together: Not on file    Attends religious service: Not on file    Active member of club or organization: Not on file    Attends meetings of clubs or organizations: Not on file    Relationship status: Not on file  . Intimate partner violence:    Fear of current or ex partner: Not on file    Emotionally abused: Not on file    Physically abused: Not on file      Forced sexual activity: Not on file  Other Topics Concern  . Not on file  Social History Narrative  . Not on file    REVIEW OF SYSTEMS: Constitutional: No fevers, chills, or sweats, no generalized fatigue, change  in appetite Eyes: No visual changes, double vision, eye pain Ear, nose and throat: No hearing loss, ear pain, nasal congestion, sore throat Cardiovascular: No chest pain, palpitations Respiratory:  No shortness of breath at rest or with exertion, wheezes GastrointestinaI: No nausea, vomiting, diarrhea, abdominal pain, fecal incontinence Genitourinary:  No dysuria, urinary retention or frequency Musculoskeletal:  No neck pain, back pain Integumentary: No rash, pruritus, skin lesions Neurological: as above Psychiatric: anxiety Endocrine: No palpitations, fatigue, diaphoresis, mood swings, change in appetite, change in weight, increased thirst Hematologic/Lymphatic:  No purpura, petechiae. Allergic/Immunologic: no itchy/runny eyes, nasal congestion, recent allergic reactions, rashes  PHYSICAL EXAM: Vitals:   12/19/17 1335  BP: 118/68  Pulse: 74  SpO2: 97%   General: No acute distress.  Patient appears well-groomed.  Head:  Normocephalic/atraumatic Eyes:  Fundi examined but not visualized Neck: supple, no paraspinal tenderness, full range of motion Heart:  Regular rate and rhythm Lungs:  Clear to auscultation bilaterally Back: No paraspinal tenderness Neurological Exam: alert and oriented to person, place, and time. Attention span and concentration intact, recent and remote memory intact, fund of knowledge intact.  Speech fluent and not dysarthric, language intact.  CN II-XII intact. Bulk and tone normal, muscle strength 5/5 throughout.  Sensation to light touch  intact.  Deep tendon reflexes 2+ throughout.  Finger to nose testing intact.  Gait normal, Romberg negative.   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  Increase topiramate ER to 112m at bedtime.  Limit use of pain relievers to no more than 2 days out of week to prevent rebound headache.  Headache diary. 2.  Optimize medical management for vascular risk factors:  ASA 887mdaily, blood pressure control, glycemic control, smoking cessation. 3.  Tobacco cessation counseling (CPT 99406):  Tobacco use with history of stroke  - Currently smoking 1 packs/day   - Patient was informed of the dangers of tobacco abuse including stroke, cancer, and MI, as well as benefits of tobacco cessation. - Patient is not willing to quit at this time. - Approximately 5 mins were spent counseling patient cessation techniques. We discussed various methods to help quit smoking, including deciding on a date to quit, joining a support group, pharmacological agents- nicotine gum/patch/lozenges, chantix.  - I will reassess her progress at the next follow-up visit 4.  Check carotid doppler 5.  Follow up in 4 months.  AdMetta ClinesDO  CC: MiPhilis FendtPA-C

## 2017-12-27 ENCOUNTER — Ambulatory Visit: Payer: Self-pay | Admitting: *Deleted

## 2017-12-27 ENCOUNTER — Encounter (HOSPITAL_COMMUNITY): Payer: Self-pay | Admitting: Emergency Medicine

## 2017-12-27 ENCOUNTER — Other Ambulatory Visit: Payer: Self-pay

## 2017-12-27 ENCOUNTER — Emergency Department (HOSPITAL_BASED_OUTPATIENT_CLINIC_OR_DEPARTMENT_OTHER): Payer: BLUE CROSS/BLUE SHIELD

## 2017-12-27 ENCOUNTER — Emergency Department (HOSPITAL_COMMUNITY): Payer: BLUE CROSS/BLUE SHIELD

## 2017-12-27 ENCOUNTER — Inpatient Hospital Stay (HOSPITAL_COMMUNITY)
Admission: EM | Admit: 2017-12-27 | Discharge: 2017-12-29 | DRG: 065 | Disposition: A | Discharge status: Home Health | Payer: BLUE CROSS/BLUE SHIELD | Attending: Internal Medicine | Admitting: Internal Medicine

## 2017-12-27 DIAGNOSIS — Z90710 Acquired absence of both cervix and uterus: Secondary | ICD-10-CM | POA: Diagnosis not present

## 2017-12-27 DIAGNOSIS — G8191 Hemiplegia, unspecified affecting right dominant side: Secondary | ICD-10-CM | POA: Diagnosis present

## 2017-12-27 DIAGNOSIS — Z794 Long term (current) use of insulin: Secondary | ICD-10-CM

## 2017-12-27 DIAGNOSIS — E1142 Type 2 diabetes mellitus with diabetic polyneuropathy: Secondary | ICD-10-CM

## 2017-12-27 DIAGNOSIS — Z885 Allergy status to narcotic agent status: Secondary | ICD-10-CM

## 2017-12-27 DIAGNOSIS — Z833 Family history of diabetes mellitus: Secondary | ICD-10-CM

## 2017-12-27 DIAGNOSIS — M7989 Other specified soft tissue disorders: Secondary | ICD-10-CM | POA: Diagnosis not present

## 2017-12-27 DIAGNOSIS — I5030 Unspecified diastolic (congestive) heart failure: Secondary | ICD-10-CM | POA: Diagnosis not present

## 2017-12-27 DIAGNOSIS — E669 Obesity, unspecified: Secondary | ICD-10-CM

## 2017-12-27 DIAGNOSIS — E119 Type 2 diabetes mellitus without complications: Secondary | ICD-10-CM

## 2017-12-27 DIAGNOSIS — Z720 Tobacco use: Secondary | ICD-10-CM

## 2017-12-27 DIAGNOSIS — Z79899 Other long term (current) drug therapy: Secondary | ICD-10-CM

## 2017-12-27 DIAGNOSIS — K219 Gastro-esophageal reflux disease without esophagitis: Secondary | ICD-10-CM | POA: Diagnosis present

## 2017-12-27 DIAGNOSIS — I639 Cerebral infarction, unspecified: Secondary | ICD-10-CM | POA: Diagnosis present

## 2017-12-27 DIAGNOSIS — K449 Diaphragmatic hernia without obstruction or gangrene: Secondary | ICD-10-CM | POA: Diagnosis present

## 2017-12-27 DIAGNOSIS — Z9189 Other specified personal risk factors, not elsewhere classified: Secondary | ICD-10-CM

## 2017-12-27 DIAGNOSIS — Z91040 Latex allergy status: Secondary | ICD-10-CM | POA: Diagnosis not present

## 2017-12-27 DIAGNOSIS — Z716 Tobacco abuse counseling: Secondary | ICD-10-CM

## 2017-12-27 DIAGNOSIS — Z8673 Personal history of transient ischemic attack (TIA), and cerebral infarction without residual deficits: Secondary | ICD-10-CM | POA: Diagnosis not present

## 2017-12-27 DIAGNOSIS — G4733 Obstructive sleep apnea (adult) (pediatric): Secondary | ICD-10-CM | POA: Diagnosis present

## 2017-12-27 DIAGNOSIS — R531 Weakness: Secondary | ICD-10-CM | POA: Diagnosis not present

## 2017-12-27 DIAGNOSIS — F411 Generalized anxiety disorder: Secondary | ICD-10-CM | POA: Diagnosis present

## 2017-12-27 DIAGNOSIS — F0150 Vascular dementia without behavioral disturbance: Secondary | ICD-10-CM | POA: Diagnosis present

## 2017-12-27 DIAGNOSIS — I10 Essential (primary) hypertension: Secondary | ICD-10-CM | POA: Diagnosis present

## 2017-12-27 DIAGNOSIS — F419 Anxiety disorder, unspecified: Secondary | ICD-10-CM

## 2017-12-27 DIAGNOSIS — F17210 Nicotine dependence, cigarettes, uncomplicated: Secondary | ICD-10-CM | POA: Diagnosis present

## 2017-12-27 DIAGNOSIS — Z882 Allergy status to sulfonamides status: Secondary | ICD-10-CM

## 2017-12-27 DIAGNOSIS — Z713 Dietary counseling and surveillance: Secondary | ICD-10-CM

## 2017-12-27 DIAGNOSIS — Z6833 Body mass index (BMI) 33.0-33.9, adult: Secondary | ICD-10-CM

## 2017-12-27 DIAGNOSIS — E785 Hyperlipidemia, unspecified: Secondary | ICD-10-CM | POA: Diagnosis present

## 2017-12-27 DIAGNOSIS — E1165 Type 2 diabetes mellitus with hyperglycemia: Secondary | ICD-10-CM | POA: Diagnosis present

## 2017-12-27 DIAGNOSIS — Z7982 Long term (current) use of aspirin: Secondary | ICD-10-CM | POA: Diagnosis not present

## 2017-12-27 DIAGNOSIS — Z888 Allergy status to other drugs, medicaments and biological substances status: Secondary | ICD-10-CM

## 2017-12-27 DIAGNOSIS — Z8711 Personal history of peptic ulcer disease: Secondary | ICD-10-CM

## 2017-12-27 DIAGNOSIS — Z91048 Other nonmedicinal substance allergy status: Secondary | ICD-10-CM

## 2017-12-27 DIAGNOSIS — Z7951 Long term (current) use of inhaled steroids: Secondary | ICD-10-CM

## 2017-12-27 HISTORY — DX: Cerebral infarction, unspecified: I63.9

## 2017-12-27 LAB — COMPREHENSIVE METABOLIC PANEL
ALBUMIN: 3.8 g/dL (ref 3.5–5.0)
ALT: 17 U/L (ref 0–44)
AST: 23 U/L (ref 15–41)
Alkaline Phosphatase: 60 U/L (ref 38–126)
Anion gap: 6 (ref 5–15)
BUN: 7 mg/dL (ref 6–20)
CHLORIDE: 106 mmol/L (ref 98–111)
CO2: 26 mmol/L (ref 22–32)
CREATININE: 0.91 mg/dL (ref 0.44–1.00)
Calcium: 8.9 mg/dL (ref 8.9–10.3)
GFR calc Af Amer: >60 mL/min (ref >60)
GFR calc non Af Amer: >60 mL/min (ref >60)
GLUCOSE: 226 mg/dL — AB (ref 70–99)
POTASSIUM: 4.5 mmol/L (ref 3.5–5.1)
SODIUM: 138 mmol/L (ref 135–145)
Total Bilirubin: 1.2 mg/dL (ref 0.3–1.2)
Total Protein: 6.6 g/dL (ref 6.5–8.1)

## 2017-12-27 LAB — CBC
HCT: 48.8 % — ABNORMAL HIGH (ref 36.0–46.0)
Hemoglobin: 15.5 g/dL — ABNORMAL HIGH (ref 12.0–15.0)
MCH: 28.5 pg (ref 26.0–34.0)
MCHC: 31.8 g/dL (ref 30.0–36.0)
MCV: 89.7 fL (ref 78.0–100.0)
Platelets: 370 10*3/uL (ref 150–400)
RBC: 5.44 MIL/uL — ABNORMAL HIGH (ref 3.87–5.11)
RDW: 17 % — ABNORMAL HIGH (ref 11.5–15.5)
WBC: 9.4 10*3/uL (ref 4.0–10.5)

## 2017-12-27 LAB — I-STAT CHEM 8, ED
BUN: 7 mg/dL (ref 6–20)
Calcium, Ion: 1.18 mmol/L (ref 1.15–1.40)
Chloride: 103 mmol/L (ref 98–111)
Creatinine, Ser: 0.8 mg/dL (ref 0.44–1.00)
Glucose, Bld: 213 mg/dL — ABNORMAL HIGH (ref 70–99)
HCT: 47 % — ABNORMAL HIGH (ref 36.0–46.0)
Hemoglobin: 16 g/dL — ABNORMAL HIGH (ref 12.0–15.0)
Potassium: 3.9 mmol/L (ref 3.5–5.1)
Sodium: 138 mmol/L (ref 135–145)
TCO2: 23 mmol/L (ref 22–32)

## 2017-12-27 LAB — I-STAT BETA HCG BLOOD, ED (MC, WL, AP ONLY): I-stat hCG, quantitative: <5 m[IU]/mL (ref <5)

## 2017-12-27 LAB — DIFFERENTIAL
Abs Immature Granulocytes: 0.1 10*3/uL (ref 0.0–0.1)
Basophils Absolute: 0.1 10*3/uL (ref 0.0–0.1)
Basophils Relative: 1 %
Eosinophils Absolute: 0.3 10*3/uL (ref 0.0–0.7)
Eosinophils Relative: 3 %
Immature Granulocytes: 1 %
Lymphocytes Relative: 18 %
Lymphs Abs: 1.7 10*3/uL (ref 0.7–4.0)
Monocytes Absolute: 0.5 10*3/uL (ref 0.1–1.0)
Monocytes Relative: 5 %
Neutro Abs: 6.8 10*3/uL (ref 1.7–7.7)
Neutrophils Relative %: 72 %

## 2017-12-27 LAB — PROTIME-INR
INR: 1.02
PROTHROMBIN TIME: 13.3 s (ref 11.4–15.2)

## 2017-12-27 LAB — CBG MONITORING, ED
GLUCOSE-CAPILLARY: 73 mg/dL (ref 70–99)
Glucose-Capillary: 57 mg/dL — ABNORMAL LOW (ref 70–99)
Glucose-Capillary: 82 mg/dL (ref 70–99)
Glucose-Capillary: 97 mg/dL (ref 70–99)

## 2017-12-27 LAB — I-STAT TROPONIN, ED: Troponin i, poc: 0 ng/mL (ref 0.00–0.08)

## 2017-12-27 LAB — APTT: APTT: 32 s (ref 24–36)

## 2017-12-27 MED ORDER — CLOPIDOGREL BISULFATE 75 MG PO TABS
75.0000 mg | ORAL_TABLET | Freq: Every day | ORAL | Status: DC
  Administered 2017-12-28 – 2017-12-29 (×2): 75 mg via ORAL
  Filled 2017-12-27 (×3): qty 1

## 2017-12-27 MED ORDER — CLOPIDOGREL BISULFATE 300 MG PO TABS
300.0000 mg | ORAL_TABLET | Freq: Once | ORAL | Status: AC
  Administered 2017-12-27: 300 mg via ORAL
  Filled 2017-12-27: qty 1

## 2017-12-27 MED ORDER — ASPIRIN 81 MG PO CHEW
324.0000 mg | CHEWABLE_TABLET | Freq: Once | ORAL | Status: AC
Start: 2017-12-28 — End: 2017-12-28
  Administered 2017-12-28: 324 mg via ORAL
  Filled 2017-12-27: qty 4

## 2017-12-27 MED ORDER — GADOBENATE DIMEGLUMINE 529 MG/ML IV SOLN
20.0000 mL | Freq: Once | INTRAVENOUS | Status: AC
  Administered 2017-12-27: 20 mL via INTRAVENOUS

## 2017-12-27 NOTE — Consult Note (Signed)
Neurology Consultation Reason for Consult: Stroke Referring Physician: Rosalia Hammers, D  CC: Arm weakness  History is obtained from: Patient  HPI: Kristie Tapia is a 47 y.o. female who was last well prior to going to bed around 3 AM. When she awoke at 5:30 AM, she could not move her right arm very well. She says when he arrived in the emergency department after she was outside the IV TPA window and was VAN-negative. The emergency department, an MRI was performed which demonstrates a small acute infarct.  She was previously diagnosed with stroke  After MRI was done for her headaches. It appears that it was present as far back as 2014, but I do not have these images for review.    She follows with Dr. Everlena Cooper for her headaches. He started her on ASA 81mg  in January and stopped triptans after her MRI revealed the lesions to be ischemic rather than cysts as previously supposed.   LKW: 3am tpa given?: no, out of window Premorbid modified rankin scale: 1   ROS: A 14 point ROS was performed and is negative except as noted in the HPI.    Past Medical History:  Diagnosis Date  . Anemia   . Anxiety   . Complication of anesthesia ` 1998   "lungs collapsed during LEEP procedure"  . Gallstones   . GERD (gastroesophageal reflux disease)   . H/O hiatal hernia   . History of stomach ulcers   . Migraines   . Sleep apnea   . Stroke (HCC)   . Type II diabetes mellitus (HCC)      Family History  Problem Relation Age of Onset  . Breast cancer Unknown   . Diabetes Paternal Grandfather      Social History:  reports that she has been smoking cigarettes.  She has a 29.00 pack-year smoking history. She has never used smokeless tobacco. She reports that she does not drink alcohol or use drugs.   Exam: Current vital signs: BP 117/65   Pulse (!) 57   Temp 98 F (36.7 C) (Oral)   Resp 13   Ht 5\' 4"  (1.626 m)   Wt 87.1 kg (192 lb)   SpO2 99%   BMI 32.96 kg/m  Vital signs in last 24 hours: Temp:   [98 F (36.7 C)] 98 F (36.7 C) (07/26 1338) Pulse Rate:  [56-70] 57 (07/26 2000) Resp:  [13-16] 13 (07/26 2000) BP: (97-136)/(43-82) 117/65 (07/26 2000) SpO2:  [96 %-99 %] 99 % (07/26 2000) Weight:  [87.1 kg (192 lb)] 87.1 kg (192 lb) (07/26 1338)   Physical Exam  Constitutional: Appears well-developed and well-nourished.  Psych: Affect appropriate to situation Eyes: No scleral injection HENT: No OP obstrucion Head: Normocephalic.  Cardiovascular: Normal rate and regular rhythm.  Respiratory: Effort normal, non-labored breathing GI: Soft.  No distension. There is no tenderness.  Skin: WDI  Neuro: Mental Status: Patient is awake, alert, oriented to person, place, month, year, and situation. Patient is able to give a clear and coherent history. No signs of neglect, occasionally she has hesitancy of speech but she is able to repeat and name objects. Cranial Nerves: II: Visual Fields are full. Pupils are equal, round, and reactive to light.   III,IV, VI: EOMI without ptosis or diploplia.  V: Facial sensation is symmetric to temperature VII: Facial movement with mild decreased nasolabial fold on the right VIII: hearing is intact to voice X: Uvula elevates symmetrically XI: Shoulder shrug is symmetric. XII: tongue is midline without  atrophy or fasciculations.  Motor: She has moderate proximal weakness of the right arm, profound 0/5 weakness of the hand and wrist. 4/5 proximal weakness in the right leg, intact in the left Sensory: Sensation is diminished temperature throughout the right side with the exception of the face Cerebellar: FNF  are intact bilaterally  I have reviewed labs in epic and the results pertinent to this consultation are: CMP-elevated glucose of 226  I have reviewed the images obtained: MRI brain-cortically based infarct on the left.  Impression: 47 year old female with multiple cortical and subcortical infarcts in the past. She does have a 20 year  history of diabetes which could be continued and, however the distribution and cortical nature do raise suspicion for embolic phenomena.  She also has a history of headaches, though no substantial worsening and make me think that this could be CNS vasculitis.  She will need admission for risk factor modification and therapy.  Recommendations: - HgbA1c, fasting lipid panel - Frequent neuro checks - Echocardiogram - CTA head and neck - Prophylactic therapy-Antiplatelet med: Aspirin - dose 325mg  PO or 300mg  PR as well as Plavix 75 mg following 300 mg load - Risk factor modification - Telemetry monitoring - PT consult, OT consult, Speech consult - Stroke team to follow    Ritta SlotMcNeill Xochitl Egle, MD [facility] [phone]  If 7pm- 7am, please page neurology on call as listed in AMION.

## 2017-12-27 NOTE — ED Notes (Signed)
CBG 73.  Patient is A&O x4 and has been so during hypoglycemic event.

## 2017-12-27 NOTE — H&P (Addendum)
History and Physical    Kristie Tapia OZH:[id number] DOB: [birth year] DOA: 12/27/2017  PCP: Tereasa Coop, PA-C  Patient coming from: home   Chief Complaint: weakness  HPI: Kristie Tapia is a 47 y.o. female with medical history significant for uncontrolled insulin-dependent t2dm, prior ischemic stroke, questionable history OSA, obesity, who presents with above. Awoke this morning 5:30 from last known normal 3 am . Awoke unable to move right arm, also trouble moving right leg. No trouble swallowing or speaking.  Says has hx stroke. Per neuro notes prior MRI shows hx of stroke, but pt unclear if has had symptomatic stroke.   Denies chest pain, denies palpitations.   ED Course: Right upper extremity venous doppler, neuro consult (kirkpatrick), mri, labs, clopidogrel  Review of Systems: As per HPI otherwise 10 point review of systems negative.    Past Medical History:  Diagnosis Date  . Anemia   . Anxiety   . Complication of anesthesia ` 1998   "lungs collapsed during LEEP procedure"  . Gallstones   . GERD (gastroesophageal reflux disease)   . H/O hiatal hernia   . History of stomach ulcers   . Migraines   . Sleep apnea   . Stroke ([place])   . Type II diabetes mellitus ([place])     Past Surgical History:  Procedure Laterality Date  . ABDOMINAL EXPLORATION SURGERY     "I've had several"  . ABDOMINAL HYSTERECTOMY  08/1998  . APPENDECTOMY  01/03/2012  . DILATION AND CURETTAGE OF UTERUS    . LAPAROSCOPIC APPENDECTOMY  01/03/2012   Procedure: APPENDECTOMY LAPAROSCOPIC;  Surgeon: Edward Jolly, MD;  Location: [facility];  Service: General;  Laterality: N/A;  . LEEP  1998   "lungs collapsed"  . TUBAL LIGATION  05/1998     reports that she has been smoking cigarettes.  She has a 29.00 pack-year smoking history. She has never used smokeless tobacco. She reports that she does not drink alcohol or use drugs.  Allergies  Allergen Reactions  . Adhesive [Tape] Other (See Comments)      Blisters USE PAPER TAPE ONLY  . Sulfa Antibiotics Other (See Comments)    Burns the inside of my mouth; "blisters; leaves tongue and inside of mouth solid red"  . Decongestant [Pseudoephedrine Hcl Er] Itching    ALL DECONGESTANTS  . Prednisone Other (See Comments)    Gives me a bad attitude  . Percocet [Oxycodone-Acetaminophen] Other (See Comments)    PT reports having nightmares when taking Percocet  . Zoloft [Sertraline Hcl]   . Latex Itching    Family History  Problem Relation Age of Onset  . Breast cancer Unknown   . Diabetes Paternal Grandfather     Prior to Admission medications   Medication Sig Start Date End Date Taking? Authorizing Provider  aspirin EC 81 MG tablet Take 1 tablet (81 mg total) by mouth daily. 07/06/17   Tereasa Coop, PA-C  Continuous Blood Gluc Sensor (FREESTYLE LIBRE 14 DAY SENSOR) MISC 1 each by Does not apply route every 14 (fourteen) days. 11/21/17   Philemon Kingdom, MD  escitalopram (LEXAPRO) 20 MG tablet Take 1 tablet (20 mg total) by mouth daily. 07/06/17   Tereasa Coop, PA-C  fluticasone (FLONASE) 50 MCG/ACT nasal spray Place 2 sprays into both nostrils daily. 07/06/17   Tereasa Coop, PA-C  furosemide (LASIX) 20 MG tablet Take 0.5 tablets (10 mg total) by mouth daily. For relief of swelling. 10/12/17   Tereasa Coop, PA-C  gabapentin (NEURONTIN) 300 MG capsule Take 2 capsules (600 mg total) by mouth 3 (three) times daily. 07/06/17   Tereasa Coop, PA-C  glimepiride (AMARYL) 2 MG tablet Take 1 tablet (2 mg total) by mouth daily before breakfast. 11/04/17   Renato Shin, MD  insulin degludec (TRESIBA FLEXTOUCH) 100 UNIT/ML SOPN FlexTouch Pen Inject 0.7 mLs (70 Units total) into the skin daily. And pen needles 1/day Patient not taking: Reported on 12/11/2017 07/19/17   Renato Shin, MD  Insulin Disposable Pump (V-GO 40) KIT 1 Device by Does not apply route daily. 11/14/17   Renato Shin, MD  LORazepam (ATIVAN) 1 MG tablet Take 1 tablet (1 mg  total) by mouth 2 (two) times daily as needed for anxiety (For acute panic only. No more than 60 tabs in one year from 07/06/17). 07/06/17   Tereasa Coop, PA-C  metoCLOPramide (REGLAN) 10 MG tablet Take 1 tablet (10 mg total) by mouth every 6 (six) hours as needed for nausea. 06/12/17   [name], DO  nicotine polacrilex (NICORELIEF) 2 MG gum Take 1 each (2 mg total) by mouth as needed for smoking cessation. Patient not taking: Reported on 12/11/2017 10/12/17   Tereasa Coop, PA-C  NOVOLOG 100 UNIT/ML injection For use in pump, for a total of 40 units per day 11/14/17   Renato Shin, MD  pantoprazole (PROTONIX) 40 MG tablet  08/12/17   [provider]  promethazine (PHENERGAN) 25 MG tablet  08/05/17   [provider]  rosuvastatin (CRESTOR) 40 MG tablet Take 1 tablet (40 mg total) by mouth daily. 07/07/17   Tereasa Coop, PA-C  tiZANidine (ZANAFLEX) 4 MG tablet Take 1 tablet (4 mg total) by mouth every 6 (six) hours as needed for muscle spasms. 12/11/17   Tereasa Coop, PA-C  topiramate ER (QUDEXY XR) CS24 sprinkle capsule Take 1 capsule (100 mg total) by mouth at bedtime. 12/19/17   [name], DO  traZODone (DESYREL) 100 MG tablet Take 1-1.5 tablets (100-150 mg total) by mouth at bedtime. 12/11/17   Tereasa Coop, PA-C    Physical Exam: Vitals:   12/27/17 1915 12/27/17 2000 12/27/17 2200 12/27/17 2319  BP: (!) 97/43 117/65 115/60 134/70  Pulse: (!) 56 (!) 57 64 67  Resp: 14 13    Temp:      TempSrc:      SpO2: 96% 99% 98% 99%  Weight:      Height:        Constitutional: No acute distress Head: Atraumatic Eyes: Conjunctiva clear ENM: Moist mucous membranes. Normal dentition.  Neck: Supple Respiratory: Clear to auscultation bilaterally, no wheezing/rales/rhonchi. Normal respiratory effort. No accessory muscle use. . Cardiovascular: Regular rate and rhythm. No murmurs/rubs/gallops. Abdomen: Non-tender, non-distended. No masses. No rebound or guarding. Positive  bowel sounds. Musculoskeletal: No joint deformity upper and lower extremities. Normal ROM, no contractures. Normal muscle tone.  Skin: No rashes, lesions, or ulcers.  Extremities: No peripheral edema. Palpable peripheral pulses. Neurologic: cn2-12 grossly intact. 2/5 grip strength on right. 3/5 biceps/triceps. 3/5 r hip flexion/extension. Intact distal sensation b/l. Speech fluent. Psychiatric: Normal insight and judgement.   Labs on Admission: I have personally reviewed following labs and imaging studies  CBC: Recent Labs  Lab 12/27/17 1405 12/27/17 1410  WBC 9.4  --   NEUTROABS 6.8  --   HGB 15.5* 16.0*  HCT 48.8* 47.0*  MCV 89.7  --   PLT 370  --    Basic Metabolic Panel: Recent  Labs  Lab 12/27/17 1405 12/27/17 1410  NA 138 138  K 4.5 3.9  CL 106 103  CO2 26  --   GLUCOSE 226* 213*  BUN 7 7  CREATININE 0.91 0.80  CALCIUM 8.9  --    GFR: Estimated Creatinine Clearance: 93.9 mL/min (by C-G formula based on SCr of 0.8 mg/dL). Liver Function Tests: Recent Labs  Lab 12/27/17 1405  AST 23  ALT 17  ALKPHOS 60  BILITOT 1.2  PROT 6.6  ALBUMIN 3.8   No results for input(s): LIPASE, AMYLASE in the last 168 hours. No results for input(s): AMMONIA in the last 168 hours. Coagulation Profile: Recent Labs  Lab 12/27/17 1405  INR 1.02   Cardiac Enzymes: No results for input(s): CKTOTAL, CKMB, CKMBINDEX, TROPONINI in the last 168 hours. BNP (last 3 results) No results for input(s): PROBNP in the last 8760 hours. HbA1C: No results for input(s): HGBA1C in the last 72 hours. CBG: Recent Labs  Lab 12/27/17 1934 12/27/17 1959 12/27/17 2051 12/27/17 2150  GLUCAP 57* 73 82 97   Lipid Profile: No results for input(s): CHOL, HDL, LDLCALC, TRIG, CHOLHDL, LDLDIRECT in the last 72 hours. Thyroid Function Tests: No results for input(s): TSH, T4TOTAL, FREET4, T3FREE, THYROIDAB in the last 72 hours. Anemia Panel: No results for input(s): VITAMINB12, FOLATE, FERRITIN,  TIBC, IRON, RETICCTPCT in the last 72 hours. Urine analysis:    Component Value Date/Time   COLORURINE YELLOW 03/18/2011 1700   APPEARANCEUR CLOUDY (A) 03/18/2011 1700   LABSPEC 1.015 01/20/2013 1356   PHURINE 6.0 01/20/2013 1356   GLUCOSEU NEGATIVE 01/20/2013 1356   HGBUR NEGATIVE 01/20/2013 1356   BILIRUBINUR negative 02/02/2016 1537   KETONESUR negative 02/02/2016 1537   KETONESUR NEGATIVE 01/20/2013 1356   PROTEINUR negative 02/02/2016 1537   PROTEINUR NEGATIVE 01/20/2013 1356   UROBILINOGEN 0.2 02/02/2016 1537   UROBILINOGEN 0.2 01/20/2013 1356   NITRITE Negative 02/02/2016 1537   NITRITE NEGATIVE 01/20/2013 1356   LEUKOCYTESUR Negative 02/02/2016 1537    Radiological Exams on Admission: Ct Head Wo Contrast  Result Date: 12/27/2017 CLINICAL DATA:  Decreased function in the right arm EXAM: CT HEAD WITHOUT CONTRAST TECHNIQUE: Contiguous axial images were obtained from the base of the skull through the vertex without intravenous contrast. COMPARISON:  04/02/2017 FINDINGS: Brain: Stable encephalomalacia changes are identified in right frontal and right posterior parietal lobes. No findings to suggest acute hemorrhage, acute infarction or space-occupying mass lesion are noted. Vascular: No hyperdense vessel or unexpected calcification. Skull: Normal. Negative for fracture or focal lesion. Sinuses/Orbits: No acute finding. Other: None. IMPRESSION: Stable encephalomalacia on the right similar to that seen on prior MRI examination. No acute intracranial abnormality is noted. Electronically Signed   By: Inez Catalina M.D.   On: 12/27/2017 14:26   Mr Jeri Cos And Wo Contrast  Result Date: 12/27/2017 CLINICAL DATA:  Initial evaluation for acute right-sided weakness. EXAM: MRI HEAD WITHOUT AND WITH CONTRAST TECHNIQUE: Multiplanar, multiecho pulse sequences of the brain and surrounding structures were obtained without and with intravenous contrast. CONTRAST:  74m MULTIHANCE GADOBENATE DIMEGLUMINE  529 MG/ML IV SOLN COMPARISON:  Prior CT from earlier same day as well as previous MRI from 04/02/2017 FINDINGS: Brain: Diffuse prominence of the CSF containing spaces compatible with generalized cerebral atrophy. Multifocal areas of restricted diffusion involving the right frontal and parietal lobes, compatible with remote ischemic infarcts. Small remote lacunar infarcts seen involving the right thalamus and right cerebellar hemisphere. Changes relatively stable from previous. There is a 12  mm acute ischemic infarct involving the cortical gray matter of the left precentral gyrus, motor strip (series 5, image 83). No associated hemorrhage or mass effect. No other evidence for acute or subacute ischemia. Gray-white matter differentiation otherwise maintained. No other evidence for acute or chronic intracranial hemorrhage. No mass lesion, midline shift or mass effect. No hydrocephalus. No extra-axial fluid collection. No abnormal enhancement. Vascular: Major intracranial vascular flow voids are maintained at the skull base. Skull and upper cervical spine: Craniocervical junction within normal limits. Upper cervical spine normal. No focal marrow replacing lesion. Sinuses/Orbits: Globes and orbital soft tissues within normal limits. Paranasal sinuses are clear. No significant mastoid effusion. Other: None. IMPRESSION: 1. 12 mm acute ischemic nonhemorrhagic infarct involving the left precentral gyrus (motor strip). 2. Remote cortical infarcts involving the right frontal and parietal lobes, with additional remote lacunar infarcts involving the right thalamus and cerebellum. Electronically Signed   By: Jeannine Boga M.D.   On: 12/27/2017 22:05    EKG: nsr, normal axis, no ischemic changes  Assessment/Plan Active Problems:   Diabetes (HCC)   Chronic anxiety   CVA (cerebral vascular accident) ([place])   Tobacco abuse   Obesity   Ischemic stroke ([place])   Diabetic peripheral neuropathy ([place])   # right cortical  ischemic stroke - with right sided weakness. Out of tpa window. Sinus rhythm on ekg. Risk factors include years of uncontrolled dm and smoking. Glucose elevated. bp wnl. U/s of rue obtained, no dvt seen. - appreciate neuro recs - ct angio head/neck ordered - carotid u/s ordered - tte ordered - asa 325 x1 and then 81 daily - clopidogrel loading dose of 300 given, continue 75 daily - frequent neuro checks - pt/ot/slp - f/u a1c, lipids - tele - start atorva 80  # T2DM w/ peripheral neuropathy - uncontrolled, resistant. Most recent a1c 9.8 10/2017 - continue home degludec 40 units daily - SSI - cont home gabapentin  # Tobacco abuse - declines patch, says allergic to it - smoking cessation counseling - f/u uds  # GAD  - cont home ssri   DVT prophylaxis: heparin, scds Code Status: full  Family Communication: husband doug lord [phone]  Disposition Plan: tbd  Consults called: neuro  Admission status: tele    Desma Maxim MD [facility] Pager [phone]  If 7PM-7AM, please contact night-coverage [email or web address] Password [facility]  12/27/2017, 11:59 PM

## 2017-12-27 NOTE — ED Provider Notes (Addendum)
[facility] EMERGENCY DEPARTMENT Provider Note   CSN: [id number] Arrival date & time: 12/27/17  1309     History   Chief Complaint Chief Complaint  Patient presents with  . Extremity Weakness  . Arm Pain    HPI Kristie Tapia is a 47 y.o. female.  HPI   Kristie Tapia is a 47 year old female with a history of CVA, tobacco use, type 2 diabetes, hyperlipidemia who presents to the emergency department for evaluation of right arm not working.  Patient reports that she was otherwise well at 3 AM prior to going to sleep.  When she woke up she states that she could not move her right wrist or fingers and the entire arm feels heavy and weak.  She reports that her hand feels cool to the touch.  She denies sleeping on the arm funny or recent trauma to the arm.  She denies numbness in the hand, reports "just feels weird."  She reports that she has had an intermittent left-sided headache which radiates to her neck and right shoulder.  States that she has a history of migraines and is not sure if this is similar to prior.  She has not taken any over-the-counter medications for pain, states that the headache is moderate in severity.  She reports that she feels drunk, but did not have any alcohol today.  Also reports that she is veering towards the right side when she is walking.  She denies visual disturbance, difficulty with speech, numbness, weakness elsewhere, trouble swallowing, chest pain, shortness of breath, abdominal pain, nausea/vomiting.  Past Medical History:  Diagnosis Date  . Anemia   . Anxiety   . Complication of anesthesia ` 1998   "lungs collapsed during LEEP procedure"  . Gallstones   . GERD (gastroesophageal reflux disease)   . H/O hiatal hernia   . History of stomach ulcers   . Migraines   . Sleep apnea   . Stroke ([place])   . Type II diabetes mellitus [facility])     Patient Active Problem List   Diagnosis Date Noted  . Smoker unmotivated to quit 07/07/2017    . Chronic GERD 07/07/2017  . Chronic anxiety 07/07/2017  . Dyslipidemia 02/24/2017  . Diabetes ([place]) 02/24/2017  . Headache 02/24/2017    Past Surgical History:  Procedure Laterality Date  . ABDOMINAL EXPLORATION SURGERY     "I've had several"  . ABDOMINAL HYSTERECTOMY  08/1998  . APPENDECTOMY  01/03/2012  . DILATION AND CURETTAGE OF UTERUS    . LAPAROSCOPIC APPENDECTOMY  01/03/2012   Procedure: APPENDECTOMY LAPAROSCOPIC;  Surgeon: Edward Jolly, MD;  Location: [facility];  Service: General;  Laterality: N/A;  . LEEP  1998   "lungs collapsed"  . TUBAL LIGATION  05/1998     OB History   None      Home Medications    Prior to Admission medications   Medication Sig Start Date End Date Taking? Authorizing Provider  aspirin EC 81 MG tablet Take 1 tablet (81 mg total) by mouth daily. 07/06/17   Tereasa Coop, PA-C  Continuous Blood Gluc Sensor (FREESTYLE LIBRE 14 DAY SENSOR) MISC 1 each by Does not apply route every 14 (fourteen) days. 11/21/17   Philemon Kingdom, MD  escitalopram (LEXAPRO) 20 MG tablet Take 1 tablet (20 mg total) by mouth daily. 07/06/17   Tereasa Coop, PA-C  fluticasone (FLONASE) 50 MCG/ACT nasal spray Place 2 sprays into both nostrils daily. 07/06/17   Tereasa Coop, PA-C  furosemide (LASIX) 20 MG tablet Take 0.5 tablets (10 mg total) by mouth daily. For relief of swelling. 10/12/17   Tereasa Coop, PA-C  gabapentin (NEURONTIN) 300 MG capsule Take 2 capsules (600 mg total) by mouth 3 (three) times daily. 07/06/17   Tereasa Coop, PA-C  glimepiride (AMARYL) 2 MG tablet Take 1 tablet (2 mg total) by mouth daily before breakfast. 11/04/17   Renato Shin, MD  insulin degludec (TRESIBA FLEXTOUCH) 100 UNIT/ML SOPN FlexTouch Pen Inject 0.7 mLs (70 Units total) into the skin daily. And pen needles 1/day Patient not taking: Reported on 12/11/2017 07/19/17   Renato Shin, MD  Insulin Disposable Pump (V-GO 40) KIT 1 Device by Does not apply route daily. 11/14/17    Renato Shin, MD  LORazepam (ATIVAN) 1 MG tablet Take 1 tablet (1 mg total) by mouth 2 (two) times daily as needed for anxiety (For acute panic only. No more than 60 tabs in one year from 07/06/17). 07/06/17   Tereasa Coop, PA-C  metoCLOPramide (REGLAN) 10 MG tablet Take 1 tablet (10 mg total) by mouth every 6 (six) hours as needed for nausea. 06/12/17   [name], DO  nicotine polacrilex (NICORELIEF) 2 MG gum Take 1 each (2 mg total) by mouth as needed for smoking cessation. Patient not taking: Reported on 12/11/2017 10/12/17   Tereasa Coop, PA-C  NOVOLOG 100 UNIT/ML injection For use in pump, for a total of 40 units per day 11/14/17   Renato Shin, MD  pantoprazole (PROTONIX) 40 MG tablet  08/12/17   [provider]  promethazine (PHENERGAN) 25 MG tablet  08/05/17   [provider]  rosuvastatin (CRESTOR) 40 MG tablet Take 1 tablet (40 mg total) by mouth daily. 07/07/17   Tereasa Coop, PA-C  tiZANidine (ZANAFLEX) 4 MG tablet Take 1 tablet (4 mg total) by mouth every 6 (six) hours as needed for muscle spasms. 12/11/17   Tereasa Coop, PA-C  topiramate ER (QUDEXY XR) CS24 sprinkle capsule Take 1 capsule (100 mg total) by mouth at bedtime. 12/19/17   [name], DO  traZODone (DESYREL) 100 MG tablet Take 1-1.5 tablets (100-150 mg total) by mouth at bedtime. 12/11/17   Tereasa Coop, PA-C    Family History Family History  Problem Relation Age of Onset  . Breast cancer Unknown   . Diabetes Paternal Grandfather     Social History Social History   Tobacco Use  . Smoking status: Current Every Day Smoker    Packs/day: 1.00    Years: 29.00    Pack years: 29.00    Types: Cigarettes    Last attempt to quit: 03/05/2017    Years since quitting: 0.8  . Smokeless tobacco: Never Used  . Tobacco comment: has smoked since age 36  Substance Use Topics  . Alcohol use: No    Comment: hasn't  had s drink in over 5 yrs  . Drug use: No     Allergies   Adhesive [tape];  Sulfa antibiotics; Decongestant [pseudoephedrine hcl er]; Prednisone; Percocet [oxycodone-acetaminophen]; Zoloft [sertraline hcl]; and Latex   Review of Systems Review of Systems  Constitutional: Negative for chills and fever.  HENT: Negative for trouble swallowing.   Eyes: Negative for visual disturbance.  Respiratory: Negative for shortness of breath.   Cardiovascular: Negative for chest pain.  Gastrointestinal: Negative for abdominal pain, nausea and vomiting.  Genitourinary: Negative for difficulty urinating, dysuria, flank pain and frequency.  Musculoskeletal: Positive for gait problem (veers  to right side) and neck pain (chronic). Negative for neck stiffness.  Skin: Negative for color change.  Neurological: Positive for weakness (right upper extremity) and headaches (left sided). Negative for dizziness, syncope, light-headedness and numbness.  Psychiatric/Behavioral: Negative for agitation.     Physical Exam Updated Vital Signs BP 136/82 (BP Location: Right Arm)   Pulse 66   Temp 98 F (36.7 C) (Oral)   Resp 15   Ht _0  (1.626 m)   Wt 87.1 kg (192 lb)   SpO2 98%   BMI 32.96 kg/m   Physical Exam  Constitutional: She is oriented to person, place, and time. She appears well-developed and well-nourished. No distress.  NAD, non-toxic appearing.   HENT:  Head: Normocephalic and atraumatic.  Mouth/Throat: Oropharynx is clear and moist.  Eyes: Pupils are equal, round, and reactive to light. Conjunctivae and EOM are normal. Right eye exhibits no discharge. Left eye exhibits no discharge.  Neck: Normal range of motion. Neck supple.  No midline cervical spine tenderness.   Cardiovascular: Normal rate and regular rhythm.  Pulmonary/Chest: Effort normal and breath sounds normal. No stridor. No respiratory distress. She has no wheezes. She has no rales.  Abdominal: Soft. There is no tenderness.  Musculoskeletal:  No evidence of trauma over right arm or hand. Mild swelling in  right forearm and fingers of right hand. Radial and ulnar pulses 2+ bilaterally.   Neurological: She is alert and oriented to person, place, and time. Coordination normal.  Mental Status:  Alert, oriented, thought content appropriate, able to give a coherent history. Speech fluent without evidence of aphasia. Able to follow 2 step commands without difficulty.  Cranial Nerves:  II:  Peripheral visual fields grossly normal, pupils equal, round, reactive to light III,IV, VI: ptosis not present, extra-ocular motions intact bilaterally  V,VII: smile symmetric, facial light touch sensation equal VIII: hearing grossly normal to voice  X: uvula elevates symmetrically  XI: bilateral shoulder shrug symmetric and strong XII: midline tongue extension without fassiculations Motor:  Unable to move right wrist or hand due to weakness. Right shoulder and elbow with good active range of motion. Strength 4/5 in right shoulder flexion/extension compared to 5/5 strength in LUE.  Sensory: Sensation to light touch normal in all extremities.  patella Gait: requires one person assistance for ambulation, leans to right.  CV: distal pulses palpable throughout   Skin: She is not diaphoretic.  Psychiatric: She has a normal mood and affect. Her behavior is normal.  Nursing note and vitals reviewed.    ED Treatments / Results  Labs (all labs ordered are listed, but only abnormal results are displayed) Labs Reviewed  CBC - Abnormal; Notable for the following components:      Result Value   RBC 5.44 (*)    Hemoglobin 15.5 (*)    HCT 48.8 (*)    RDW 17.0 (*)    All other components within normal limits  COMPREHENSIVE METABOLIC PANEL - Abnormal; Notable for the following components:   Glucose, Bld 226 (*)    All other components within normal limits  I-STAT CHEM 8, ED - Abnormal; Notable for the following components:   Glucose, Bld 213 (*)    Hemoglobin 16.0 (*)    HCT 47.0 (*)    All other components within  normal limits  PROTIME-INR  APTT  DIFFERENTIAL  I-STAT TROPONIN, ED  CBG MONITORING, ED  I-STAT BETA HCG BLOOD, ED ([facility], [facility], [facility] ONLY)    EKG None  Radiology Ct Head  Wo Contrast  Result Date: 12/27/2017 CLINICAL DATA:  Decreased function in the right arm EXAM: CT HEAD WITHOUT CONTRAST TECHNIQUE: Contiguous axial images were obtained from the base of the skull through the vertex without intravenous contrast. COMPARISON:  04/02/2017 FINDINGS: Brain: Stable encephalomalacia changes are identified in right frontal and right posterior parietal lobes. No findings to suggest acute hemorrhage, acute infarction or space-occupying mass lesion are noted. Vascular: No hyperdense vessel or unexpected calcification. Skull: Normal. Negative for fracture or focal lesion. Sinuses/Orbits: No acute finding. Other: None. IMPRESSION: Stable encephalomalacia on the right similar to that seen on prior MRI examination. No acute intracranial abnormality is noted. Electronically Signed   By: Inez Catalina M.D.   On: 12/27/2017 14:26   Mr Jeri Cos And Wo Contrast  Result Date: 12/27/2017 CLINICAL DATA:  Initial evaluation for acute right-sided weakness. EXAM: MRI HEAD WITHOUT AND WITH CONTRAST TECHNIQUE: Multiplanar, multiecho pulse sequences of the brain and surrounding structures were obtained without and with intravenous contrast. CONTRAST:  26m MULTIHANCE GADOBENATE DIMEGLUMINE 529 MG/ML IV SOLN COMPARISON:  Prior CT from earlier same day as well as previous MRI from 04/02/2017 FINDINGS: Brain: Diffuse prominence of the CSF containing spaces compatible with generalized cerebral atrophy. Multifocal areas of restricted diffusion involving the right frontal and parietal lobes, compatible with remote ischemic infarcts. Small remote lacunar infarcts seen involving the right thalamus and right cerebellar hemisphere. Changes relatively stable from previous. There is a 12 mm acute ischemic infarct involving the cortical gray  matter of the left precentral gyrus, motor strip (series 5, image 83). No associated hemorrhage or mass effect. No other evidence for acute or subacute ischemia. Gray-white matter differentiation otherwise maintained. No other evidence for acute or chronic intracranial hemorrhage. No mass lesion, midline shift or mass effect. No hydrocephalus. No extra-axial fluid collection. No abnormal enhancement. Vascular: Major intracranial vascular flow voids are maintained at the skull base. Skull and upper cervical spine: Craniocervical junction within normal limits. Upper cervical spine normal. No focal marrow replacing lesion. Sinuses/Orbits: Globes and orbital soft tissues within normal limits. Paranasal sinuses are clear. No significant mastoid effusion. Other: None. IMPRESSION: 1. 12 mm acute ischemic nonhemorrhagic infarct involving the left precentral gyrus (motor strip). 2. Remote cortical infarcts involving the right frontal and parietal lobes, with additional remote lacunar infarcts involving the right thalamus and cerebellum. Electronically Signed   By: BJeannine BogaM.D.   On: 12/27/2017 22:05    Procedures Procedures (including critical care time) CRITICAL CARE Performed by: EGlyn Ade  Total critical care time: 35 minutes  Critical care time was exclusive of separately billable procedures and treating other patients.  Critical care was necessary to treat or prevent imminent or life-threatening deterioration.  Critical care was time spent personally by me on the following activities: development of treatment plan with patient and/or surrogate as well as nursing, discussions with consultants, evaluation of patient's response to treatment, examination of patient, obtaining history from patient or surrogate, ordering and performing treatments and interventions, ordering and review of laboratory studies, ordering and review of radiographic studies, pulse oximetry and re-evaluation of  patient's condition.  Medications Ordered in ED Medications  clopidogrel (PLAVIX) tablet 75 mg (has no administration in time range)  gadobenate dimeglumine (MULTIHANCE) injection 20 mL (20 mLs Intravenous Contrast Given 12/27/17 2149)  clopidogrel (PLAVIX) tablet 300 mg (300 mg Oral Given 12/27/17 2319)     Initial Impression / Assessment and Plan / ED Course  I have reviewed the triage vital  signs and the nursing notes.  Pertinent labs & imaging results that were available during my care of the patient were reviewed by me and considered in my medical decision making (see chart for details).     Presents to the emergency department for evaluation of new onset right upper extremity weakness.  Has a history of prior strokes.  Last known normal >4.5 hours prior to arrival. She is LVO-. MRI brain reveals 12 mm ischemic stroke involving left precentral gyrus.  Patient seen by neurologist Dr. Leonel Ramsay in the ED and recommends medicine admit. Discussed this patient with Hospitalist service who will admit the patient for stroke work up. This was a shared visit with Dr. Jeanell Sparrow who also saw the patient and agrees with plan. Patient informed and agrees with admission plan.   Final Clinical Impressions(s) / ED Diagnoses   Final diagnoses:  None    ED Discharge Orders    None       Glyn Ade, PA-C 12/27/17 2357    Glyn Ade, PA-C 12/28/17 [id number]    Pattricia Boss, MD 12/28/17 [phone]

## 2017-12-27 NOTE — ED Provider Notes (Signed)
Patient placed in Quick Look pathway, seen and evaluated   Chief Complaint: Right upper extremity weakness and swelling  HPI: Patient with history of stroke presents with complaint of inability to use her right hand starting after she woke up at 6 AM today.  Patient states that she went to bed about 3 AM and was normal.  She also has swelling of the arm.  She denies any injuries or pressure on the arm.  She slept with her arm above her head.  She states that she is having difficulty finding words and has a history of a "vascular dementia".  ROS:  Positive ROS: (+) Arm weakness and tingling  negative ROS: (-) Headache, leg weakness, neck pain  Physical Exam:   Gen: No distress  Neuro: Awake and Alert  Skin: Warm    Focused Exam: Heart RRR, nml S1,S2, no m/r/g; Lungs CTAB; Abd soft, NT, no rebound or guarding; palpable right radial pulse with normal capillary refill, patient has pain with palpation of her wrist and hand.  There is mild edema of the forearm and hand.  She is unable to flex or extend at her wrist or move her fingers.  She is able to flex and extend at her shoulder and elbow but states it is painful.  No neck pain.  Ht 5\' 4"  (1.626 m)   Wt 87.1 kg (192 lb)   BMI 32.96 kg/m   Plan: Given history of stroke, stroke work-up ordered.  Given upper extremity swelling, DVT study ordered.  Patient is not a code stroke because her last known well was greater than 4-1/2 hours ago.  She is not LVO positive.  Initiation of care has begun. The patient has been counseled on the process, plan, and necessity for staying for the completion/evaluation, and the remainder of the medical screening examination    Renne CriglerGeiple, Miara Emminger, Cordelia Poche-C 12/27/17 1353    Eber HongMiller, Brian, MD 12/28/17 2128

## 2017-12-27 NOTE — ED Triage Notes (Signed)
Pt states woke up this morning with right arm weakness, tingling in right arm. Does have right arm drift and difficulty walking. No facial droop noted. Speech clear. Hx of several stroke. C/o left sided headache.

## 2017-12-27 NOTE — Telephone Encounter (Signed)
Pt called with complaint that her "right arm not working well"; she states that her hand and wrist is floppy and not working at all; these symptoms started at app 0530 this morning; she states that it was from her shoulder to elbow; now her right hand "has a funny feeling" and she has no control over it, and she can not move anything below her right elbow; limb was normal about 0300 12/27/17; pt states that she is diabetic but is unable to take her blood sugar or eat;   Recommendations made per nurse triage protocol to include calling EMS; the pt states that she would like to speak with Deliah BostonMichael Clark because "he knows how I feel about going the the ED;  [facility] and she confirms that the pt should call EMS; the pt states she will have her husband come home and drop her off at the ED; will route to office for notification of this encounter. Reason for Disposition . [1] Weakness (i.e., paralysis, loss of muscle strength) of the face, arm / hand, or leg / foot on one side of the body AND [2] sudden onset AND [3] present now  Answer Assessment - Initial Assessment Questions 1. SYMPTOM: "What is the main symptom you are concerned about?" (e.g., weakness, numbness)     Unable to use right arm 2. ONSET: "When did this start?" (minutes, hours, days; while sleeping)     0530 7/26 3. LAST NORMAL: "When was the last time you were normal (no symptoms)?"         Last seen normal last night at 0300 7/26 4. PATTERN "Does this come and go, or has it been constant since it started?"  "Is it present now?"     constant 5. CARDIAC SYMPTOMS: "Have you had any of the following symptoms: chest pain, difficulty breathing, palpitations?"     no 6. NEUROLOGIC SYMPTOMS: "Have you had any of the following symptoms: headache, dizziness, vision loss, double vision, changes in speech, unsteady on your feet?"     no 7. OTHER SYMPTOMS: "Do you have any other symptoms?"     no 8. PREGNANCY: "Is there any chance  you are pregnant?" "When was your last menstrual period?"     No hysterectomy  Protocols used: NEUROLOGIC DEFICIT-A-AH

## 2017-12-27 NOTE — ED Notes (Addendum)
  Patient requested we check her CBG.  It was 57.  Patient was given 8oz OJ and tablespoon of PB. Will recheck in 15 mins

## 2017-12-27 NOTE — Progress Notes (Signed)
*  Preliminary Results* Right upper extremity venous duplex completed. Right upper extremity is negative for deep and superficial vein thrombosis.  12/27/2017 2:53 PM  Teleah Villamar Clare Gandyiddle

## 2017-12-28 ENCOUNTER — Inpatient Hospital Stay (HOSPITAL_COMMUNITY): Payer: BLUE CROSS/BLUE SHIELD

## 2017-12-28 ENCOUNTER — Encounter (HOSPITAL_COMMUNITY): Payer: Self-pay | Admitting: Obstetrics and Gynecology

## 2017-12-28 ENCOUNTER — Other Ambulatory Visit: Payer: Self-pay

## 2017-12-28 DIAGNOSIS — Z720 Tobacco use: Secondary | ICD-10-CM

## 2017-12-28 DIAGNOSIS — E1142 Type 2 diabetes mellitus with diabetic polyneuropathy: Secondary | ICD-10-CM

## 2017-12-28 DIAGNOSIS — I5030 Unspecified diastolic (congestive) heart failure: Secondary | ICD-10-CM

## 2017-12-28 DIAGNOSIS — Z794 Long term (current) use of insulin: Secondary | ICD-10-CM

## 2017-12-28 LAB — CBC
HEMATOCRIT: 45.7 % (ref 36.0–46.0)
Hemoglobin: 14.8 g/dL (ref 12.0–15.0)
MCH: 28.2 pg (ref 26.0–34.0)
MCHC: 32.4 g/dL (ref 30.0–36.0)
MCV: 87 fL (ref 78.0–100.0)
Platelets: 329 10*3/uL (ref 150–400)
RBC: 5.25 MIL/uL — ABNORMAL HIGH (ref 3.87–5.11)
RDW: 16.9 % — AB (ref 11.5–15.5)
WBC: 10.5 10*3/uL (ref 4.0–10.5)

## 2017-12-28 LAB — GLUCOSE, CAPILLARY
GLUCOSE-CAPILLARY: 173 mg/dL — AB (ref 70–99)
Glucose-Capillary: 151 mg/dL — ABNORMAL HIGH (ref 70–99)
Glucose-Capillary: 267 mg/dL — ABNORMAL HIGH (ref 70–99)
Glucose-Capillary: 192 mg/dL — ABNORMAL HIGH (ref 70–99)

## 2017-12-28 LAB — LIPID PANEL
Cholesterol: 85 mg/dL (ref 0–200)
HDL: 30 mg/dL — ABNORMAL LOW (ref >40)
LDL CALC: 18 mg/dL (ref 0–99)
TRIGLYCERIDES: 185 mg/dL — AB (ref <150)
Total CHOL/HDL Ratio: 2.8 RATIO
VLDL: 37 mg/dL (ref 0–40)

## 2017-12-28 LAB — CREATININE, SERUM
Creatinine, Ser: 0.86 mg/dL (ref 0.44–1.00)
GFR calc non Af Amer: >60 mL/min (ref >60)

## 2017-12-28 LAB — HIV ANTIBODY (ROUTINE TESTING W REFLEX): HIV SCREEN 4TH GENERATION: NONREACTIVE

## 2017-12-28 LAB — HEMOGLOBIN A1C
Hgb A1c MFr Bld: 8.8 % — ABNORMAL HIGH (ref 4.8–5.6)
Mean Plasma Glucose: 205.86 mg/dL

## 2017-12-28 MED ORDER — PROMETHAZINE HCL 25 MG/ML IJ SOLN
12.5000 mg | Freq: Once | INTRAMUSCULAR | Status: AC
  Administered 2017-12-28: 12.5 mg via INTRAVENOUS
  Filled 2017-12-28: qty 1

## 2017-12-28 MED ORDER — INSULIN GLARGINE 100 UNIT/ML ~~LOC~~ SOLN
40.0000 [IU] | Freq: Every day | SUBCUTANEOUS | Status: DC
  Administered 2017-12-28 – 2017-12-29 (×2): 40 [IU] via SUBCUTANEOUS
  Filled 2017-12-28 (×2): qty 0.4

## 2017-12-28 MED ORDER — ASPIRIN EC 81 MG PO TBEC
81.0000 mg | DELAYED_RELEASE_TABLET | Freq: Every day | ORAL | Status: DC
  Administered 2017-12-28 – 2017-12-29 (×2): 81 mg via ORAL
  Filled 2017-12-28 (×2): qty 1

## 2017-12-28 MED ORDER — GABAPENTIN 300 MG PO CAPS: 600.0000 mg | ORAL_CAPSULE | Freq: Three times a day (TID) | ORAL | Status: DC

## 2017-12-28 MED ORDER — STROKE: EARLY STAGES OF RECOVERY BOOK
Freq: Once | Status: AC
  Administered 2017-12-28: 02:00:00

## 2017-12-28 MED ORDER — ACETAMINOPHEN 325 MG PO TABS
650.0000 mg | ORAL_TABLET | ORAL | Status: DC | PRN
  Administered 2017-12-29: 650 mg via ORAL
  Filled 2017-12-28: qty 2

## 2017-12-28 MED ORDER — ESCITALOPRAM OXALATE 20 MG PO TABS
20.0000 mg | ORAL_TABLET | Freq: Every day | ORAL | Status: DC
  Administered 2017-12-28 – 2017-12-29 (×2): 20 mg via ORAL
  Filled 2017-12-28: qty 2
  Filled 2017-12-28: qty 1
  Filled 2017-12-28: qty 2
  Filled 2017-12-28: qty 1

## 2017-12-28 MED ORDER — PANTOPRAZOLE SODIUM 40 MG PO TBEC
40.0000 mg | DELAYED_RELEASE_TABLET | Freq: Two times a day (BID) | ORAL | Status: DC
  Administered 2017-12-28 – 2017-12-29 (×2): 40 mg via ORAL
  Filled 2017-12-28 (×2): qty 1

## 2017-12-28 MED ORDER — ACETAMINOPHEN 650 MG RE SUPP: 650.0000 mg | RECTAL | Status: DC | PRN

## 2017-12-28 MED ORDER — ATORVASTATIN CALCIUM 80 MG PO TABS: 80.0000 mg | ORAL_TABLET | Freq: Every day | ORAL | Status: DC

## 2017-12-28 MED ORDER — IOPAMIDOL (ISOVUE-370) INJECTION 76%
INTRAVENOUS | Status: AC
  Administered 2017-12-28: 02:00:00
  Filled 2017-12-28: qty 50

## 2017-12-28 MED ORDER — ACETAMINOPHEN 160 MG/5ML PO SOLN: 650.0000 mg | ORAL | Status: DC | PRN

## 2017-12-28 MED ORDER — IOPAMIDOL (ISOVUE-370) INJECTION 76%
50.0000 mL | Freq: Once | INTRAVENOUS | Status: AC | PRN
  Administered 2017-12-28: 50 mL via INTRAVENOUS

## 2017-12-28 MED ORDER — LORAZEPAM 1 MG PO TABS
1.0000 mg | ORAL_TABLET | Freq: Two times a day (BID) | ORAL | Status: DC | PRN
  Administered 2017-12-28: 1 mg via ORAL
  Filled 2017-12-28: qty 1

## 2017-12-28 MED ORDER — HEPARIN SODIUM (PORCINE) 5000 UNIT/ML IJ SOLN
5000.0000 [IU] | Freq: Three times a day (TID) | INTRAMUSCULAR | Status: DC
  Administered 2017-12-28 – 2017-12-29 (×4): 5000 [IU] via SUBCUTANEOUS
  Filled 2017-12-28 (×5): qty 1

## 2017-12-28 MED ORDER — ALUM & MAG HYDROXIDE-SIMETH 200-200-20 MG/5ML PO SUSP: 30.0000 mL | Freq: Four times a day (QID) | ORAL | Status: DC | PRN

## 2017-12-28 MED ORDER — LORAZEPAM 1 MG PO TABS
1.0000 mg | ORAL_TABLET | Freq: Once | ORAL | Status: AC
  Administered 2017-12-28: 1 mg via ORAL
  Filled 2017-12-28: qty 1

## 2017-12-28 MED ORDER — INSULIN ASPART 100 UNIT/ML ~~LOC~~ SOLN
0.0000 [IU] | Freq: Three times a day (TID) | SUBCUTANEOUS | Status: DC
  Administered 2017-12-28: 8 [IU] via SUBCUTANEOUS
  Administered 2017-12-28 – 2017-12-29 (×3): 3 [IU] via SUBCUTANEOUS

## 2017-12-28 NOTE — Progress Notes (Signed)
  Echocardiogram 2D Echocardiogram has been performed.  Kristie ChimesWendy  Giannamarie Tapia 12/28/2017, 11:18 AM

## 2017-12-28 NOTE — Progress Notes (Signed)
SLP Cancellation Note  Patient Details Name: Kristie ReeveLora Tapia MRN: [id number] DOB: [birth year]   Cancelled treatment:       Reason Eval/Treat Not Completed: Other (comment)(Screened for swallow, passed RN swallow screen).    Please consider ordering SLE (Speech-Language Evaluation). Thank you!   Angela NevinJohn T. Cressida Milford, MA, CCC-SLP 12/28/17 1:25 PM

## 2017-12-28 NOTE — Progress Notes (Signed)
Pt returned from CT at this time.  

## 2017-12-28 NOTE — Progress Notes (Signed)
[facility] PROGRESS NOTE  Kristie Tapia ZOX:[id number] DOB: Sep 20, 1970 DOA: 12/27/2017  PCP: Ofilia Neas, PA-C  Brief History/Interval Summary: 47 year old Caucasian female with past medical history of insulin-dependent diabetes mellitus, prior ischemic stroke, obesity presented with weakness of the right arm and right leg.  MRI raised concern for acute stroke.  She was hospitalized for further management.  Reason for Visit: Acute stroke  Consultants: Neurology  Procedures:  Transthoracic echocardiogram is pending  Antibiotics: None  Subjective/Interval History: Patient somewhat somnolent this morning.  Has been refusing her Lantus insulin.  She was educated about the same.  Right sided weakness has improved slightly.  ROS: Unable to do due to her somnolence  Objective:  Vital Signs  Vitals:   12/28/17 0246 12/28/17 0400 12/28/17 0500 12/28/17 0720  BP: 131/74 133/77  (!) 111/59  Pulse: (!) 59 63  62  Resp:    17  Temp: 97.9 F (36.6 C)   98.3 F (36.8 C)  TempSrc: Oral   Oral  SpO2: 99% 96%  98%  Weight:   89.8 kg (197 lb 15.6 oz)   Height:        Intake/Output Summary (Last 24 hours) at 12/28/2017 1137 Last data filed at 12/28/2017 0800 Gross per 24 hour  Intake 50 ml  Output -  Net 50 ml   Filed Weights   12/27/17 1338 12/28/17 0500  Weight: 87.1 kg (192 lb) 89.8 kg (197 lb 15.6 oz)    General appearance: Somnolent but easily arousable.  In no distress Head: Normocephalic, without obvious abnormality, atraumatic Resp: clear to auscultation bilaterally Cardio: regular rate and rhythm, S1, S2 normal, no murmur, click, rub or gallop GI: soft, non-tender; bowel sounds normal; no masses,  no organomegaly Extremities: extremities normal, atraumatic, no cyanosis or edema Pulses: 2+ and symmetric Neurologic: Subtle right-sided weakness noted.  Patient is somnolent but easily arousable.  No obvious facial asymmetry.  Lab Results:  Data  Reviewed: I have personally reviewed following labs and imaging studies  CBC: Recent Labs  Lab 12/27/17 1405 12/27/17 1410 12/28/17 0445  WBC 9.4  --  10.5  NEUTROABS 6.8  --   --   HGB 15.5* 16.0* 14.8  HCT 48.8* 47.0* 45.7  MCV 89.7  --  87.0  PLT 370  --  329    Basic Metabolic Panel: Recent Labs  Lab 12/27/17 1405 12/27/17 1410 12/28/17 0445  NA 138 138  --   K 4.5 3.9  --   CL 106 103  --   CO2 26  --   --   GLUCOSE 226* 213*  --   BUN 7 7  --   CREATININE 0.91 0.80 0.86  CALCIUM 8.9  --   --     GFR: Estimated Creatinine Clearance: 88.6 mL/min (by C-G formula based on SCr of 0.86 mg/dL).  Liver Function Tests: Recent Labs  Lab 12/27/17 1405  AST 23  ALT 17  ALKPHOS 60  BILITOT 1.2  PROT 6.6  ALBUMIN 3.8     Coagulation Profile: Recent Labs  Lab 12/27/17 1405  INR 1.02    HbA1C: Recent Labs    12/28/17 0445  HGBA1C 8.8*    CBG: Recent Labs  Lab 12/27/17 1934 12/27/17 1959 12/27/17 2051 12/27/17 2150 12/28/17 0614  GLUCAP 57* 73 82 97 151*    Lipid Profile: Recent Labs    12/28/17 0445  CHOL 85  HDL 30*  LDLCALC 18  TRIG 811*  CHOLHDL 2.8  Radiology Studies: Ct Angio Head W Or Wo Contrast  Result Date: 12/28/2017 CLINICAL DATA:  RIGHT extremity weakness, follow-up stroke. History of diabetes, migraines. EXAM: CT ANGIOGRAPHY HEAD AND NECK TECHNIQUE: Multidetector CT imaging of the head and neck was performed using the standard protocol during bolus administration of intravenous contrast. Multiplanar CT image reconstructions and MIPs were obtained to evaluate the vascular anatomy. Carotid stenosis measurements (when applicable) are obtained utilizing NASCET criteria, using the distal internal carotid diameter as the denominator. CONTRAST:  50mL ISOVUE-370 IOPAMIDOL (ISOVUE-370) INJECTION 76% COMPARISON:  MRI head December 27, 2017 FINDINGS: CTA NECK FINDINGS: AORTIC ARCH: Normal appearance of the thoracic arch, normal branch  pattern. Trace calcific atherosclerosis aortic arch. The origins of the innominate, left Common carotid artery and subclavian artery are patent. Mild intimal thickening proximal LEFT subclavian artery. RIGHT CAROTID SYSTEM: Common carotid artery is patent. Trace calcific atherosclerosis of the carotid bifurcation without hemodynamically significant stenosis by NASCET criteria. Normal appearance of the internal carotid artery. LEFT CAROTID SYSTEM: Common carotid artery is patent. Normal appearance of the carotid bifurcation without hemodynamically significant stenosis by NASCET criteria. Normal appearance of the internal carotid artery. VERTEBRAL ARTERIES:Left vertebral artery is dominant. Normal appearance of the vertebral arteries, widely patent. SKELETON: No acute osseous process though bone windows have not been submitted. OTHER NECK: Soft tissues of the neck are nonacute though, not tailored for evaluation. Heterogeneous thyroid without dominant nodule. Uncovertebral hypertrophy and facet arthropathy resulting in moderate to severe RIGHT C3-4 neural foraminal narrowing. UPPER CHEST: Included lung apices are clear. No superior mediastinal lymphadenopathy. CTA HEAD FINDINGS: ANTERIOR CIRCULATION: Patent cervical internal carotid arteries, petrous, cavernous and supra clinoid internal carotid arteries. Patent anterior communicating artery. Patent anterior and middle cerebral arteries, moderate tandem stenosis mid to distal bilateral MCA's. No large vessel occlusion, significant stenosis, contrast extravasation or aneurysm. POSTERIOR CIRCULATION: Patent vertebral arteries, vertebrobasilar junction and basilar artery, as well as main branch vessels. Patent posterior cerebral arteries, moderate stenosis LEFT P2 segment. No large vessel occlusion, significant stenosis, contrast extravasation or aneurysm. VENOUS SINUSES: Major dural venous sinuses are patent though not tailored for evaluation on this angiographic  examination. ANATOMIC VARIANTS: None. DELAYED PHASE: No abnormal intracranial enhancement. MIP images reviewed. IMPRESSION: CTA NECK: 1. No hemodynamically significant stenosis ICA's. Patent vertebral arteries. 2. Moderate to severe RIGHT C3-4 neural foraminal narrowing. CTA HEAD: 1. No emergent large vessel occlusion or flow-limiting stenosis. 2. Moderate stenoses bilateral MCA and LEFT PCA compatible with atherosclerosis. Aortic Atherosclerosis (ICD10-I70.0). Electronically Signed   By: Awilda Metro M.D.   On: 12/28/2017 02:48   Ct Head Wo Contrast  Result Date: 12/27/2017 CLINICAL DATA:  Decreased function in the right arm EXAM: CT HEAD WITHOUT CONTRAST TECHNIQUE: Contiguous axial images were obtained from the base of the skull through the vertex without intravenous contrast. COMPARISON:  04/02/2017 FINDINGS: Brain: Stable encephalomalacia changes are identified in right frontal and right posterior parietal lobes. No findings to suggest acute hemorrhage, acute infarction or space-occupying mass lesion are noted. Vascular: No hyperdense vessel or unexpected calcification. Skull: Normal. Negative for fracture or focal lesion. Sinuses/Orbits: No acute finding. Other: None. IMPRESSION: Stable encephalomalacia on the right similar to that seen on prior MRI examination. No acute intracranial abnormality is noted. Electronically Signed   By: Alcide Clever M.D.   On: 12/27/2017 14:26   Ct Angio Neck W Or Wo Contrast  Result Date: 12/28/2017 CLINICAL DATA:  RIGHT extremity weakness, follow-up stroke. History of diabetes, migraines. EXAM: CT ANGIOGRAPHY HEAD AND NECK  TECHNIQUE: Multidetector CT imaging of the head and neck was performed using the standard protocol during bolus administration of intravenous contrast. Multiplanar CT image reconstructions and MIPs were obtained to evaluate the vascular anatomy. Carotid stenosis measurements (when applicable) are obtained utilizing NASCET criteria, using the distal  internal carotid diameter as the denominator. CONTRAST:  50mL ISOVUE-370 IOPAMIDOL (ISOVUE-370) INJECTION 76% COMPARISON:  MRI head December 27, 2017 FINDINGS: CTA NECK FINDINGS: AORTIC ARCH: Normal appearance of the thoracic arch, normal branch pattern. Trace calcific atherosclerosis aortic arch. The origins of the innominate, left Common carotid artery and subclavian artery are patent. Mild intimal thickening proximal LEFT subclavian artery. RIGHT CAROTID SYSTEM: Common carotid artery is patent. Trace calcific atherosclerosis of the carotid bifurcation without hemodynamically significant stenosis by NASCET criteria. Normal appearance of the internal carotid artery. LEFT CAROTID SYSTEM: Common carotid artery is patent. Normal appearance of the carotid bifurcation without hemodynamically significant stenosis by NASCET criteria. Normal appearance of the internal carotid artery. VERTEBRAL ARTERIES:Left vertebral artery is dominant. Normal appearance of the vertebral arteries, widely patent. SKELETON: No acute osseous process though bone windows have not been submitted. OTHER NECK: Soft tissues of the neck are nonacute though, not tailored for evaluation. Heterogeneous thyroid without dominant nodule. Uncovertebral hypertrophy and facet arthropathy resulting in moderate to severe RIGHT C3-4 neural foraminal narrowing. UPPER CHEST: Included lung apices are clear. No superior mediastinal lymphadenopathy. CTA HEAD FINDINGS: ANTERIOR CIRCULATION: Patent cervical internal carotid arteries, petrous, cavernous and supra clinoid internal carotid arteries. Patent anterior communicating artery. Patent anterior and middle cerebral arteries, moderate tandem stenosis mid to distal bilateral MCA's. No large vessel occlusion, significant stenosis, contrast extravasation or aneurysm. POSTERIOR CIRCULATION: Patent vertebral arteries, vertebrobasilar junction and basilar artery, as well as main branch vessels. Patent posterior cerebral  arteries, moderate stenosis LEFT P2 segment. No large vessel occlusion, significant stenosis, contrast extravasation or aneurysm. VENOUS SINUSES: Major dural venous sinuses are patent though not tailored for evaluation on this angiographic examination. ANATOMIC VARIANTS: None. DELAYED PHASE: No abnormal intracranial enhancement. MIP images reviewed. IMPRESSION: CTA NECK: 1. No hemodynamically significant stenosis ICA's. Patent vertebral arteries. 2. Moderate to severe RIGHT C3-4 neural foraminal narrowing. CTA HEAD: 1. No emergent large vessel occlusion or flow-limiting stenosis. 2. Moderate stenoses bilateral MCA and LEFT PCA compatible with atherosclerosis. Aortic Atherosclerosis (ICD10-I70.0). Electronically Signed   By: Awilda Metroourtnay  Bloomer M.D.   On: 12/28/2017 02:48   Mr Laqueta JeanBrain W And Wo Contrast  Result Date: 12/27/2017 CLINICAL DATA:  Initial evaluation for acute right-sided weakness. EXAM: MRI HEAD WITHOUT AND WITH CONTRAST TECHNIQUE: Multiplanar, multiecho pulse sequences of the brain and surrounding structures were obtained without and with intravenous contrast. CONTRAST:  20mL MULTIHANCE GADOBENATE DIMEGLUMINE 529 MG/ML IV SOLN COMPARISON:  Prior CT from earlier same day as well as previous MRI from 04/02/2017 FINDINGS: Brain: Diffuse prominence of the CSF containing spaces compatible with generalized cerebral atrophy. Multifocal areas of restricted diffusion involving the right frontal and parietal lobes, compatible with remote ischemic infarcts. Small remote lacunar infarcts seen involving the right thalamus and right cerebellar hemisphere. Changes relatively stable from previous. There is a 12 mm acute ischemic infarct involving the cortical gray matter of the left precentral gyrus, motor strip (series 5, image 83). No associated hemorrhage or mass effect. No other evidence for acute or subacute ischemia. Gray-white matter differentiation otherwise maintained. No other evidence for acute or chronic  intracranial hemorrhage. No mass lesion, midline shift or mass effect. No hydrocephalus. No extra-axial fluid collection. No abnormal enhancement. Vascular:  Major intracranial vascular flow voids are maintained at the skull base. Skull and upper cervical spine: Craniocervical junction within normal limits. Upper cervical spine normal. No focal marrow replacing lesion. Sinuses/Orbits: Globes and orbital soft tissues within normal limits. Paranasal sinuses are clear. No significant mastoid effusion. Other: None. IMPRESSION: 1. 12 mm acute ischemic nonhemorrhagic infarct involving the left precentral gyrus (motor strip). 2. Remote cortical infarcts involving the right frontal and parietal lobes, with additional remote lacunar infarcts involving the right thalamus and cerebellum. Electronically Signed   By: Rise Mu M.D.   On: 12/27/2017 22:05     Medications:  Scheduled: . aspirin EC  81 mg Oral Daily  . [START ON 12/29/2017] atorvastatin  80 mg Oral q1800  . clopidogrel  75 mg Oral Daily  . escitalopram  20 mg Oral Daily  . heparin  5,000 Units Subcutaneous Q8H  . insulin aspart  0-15 Units Subcutaneous TID WC  . insulin glargine  40 Units Subcutaneous Daily   Continuous:  [id number] **OR** acetaminophen (TYLENOL) oral liquid 160 mg/5 mL **OR** acetaminophen, alum & mag hydroxide-simeth  Assessment/Plan:    Acute stroke with right hemiparesis Patient was outside the window for TPA.  Sinus rhythm on EKG.  MRI brain as above.  CT angiogram head and neck did not show any significant stenosis.  Echocardiogram is pending.  It appears that patient was given loading dose of Plavix.  Currently on 75 mg of Plavix and aspirin 81 mg daily.  Also on Lipitor.  LDL noted to be 18.  HbA1c 8.8.  PT and OT evaluation pending.  Reason for somnolence not entirely clear.  Avoid sedative agents.  Further management per neurology.  Diabetes mellitus type 2 with peripheral neuropathy Apparently  uses insulin pump at home which has been removed in the hospital.  Continue with Lantus for now.  SSI.  HbA1c 8.8.  Tobacco abuse Smoking cessation counseling was provided.  Morbid obesity BMI 33.98.  Generalized anxiety disorder Continue home medications.  DVT Prophylaxis: Subcutaneous heparin    Code Status: Full code Family Communication: No family at bedside Disposition Plan: Await stroke work-up to be completed.    LOS: 1 day   Osvaldo Shipper  [facility] Pager [phone] 12/28/2017, 11:37 AM  If 7PM-7AM, please contact night-coverage at [email or web address], password [facility]

## 2017-12-28 NOTE — Evaluation (Signed)
Physical Therapy Evaluation Patient Details Name: Kristie Tapia MRN: [id number] DOB: 12-30-1970 Today's Date: 12/28/2017   History of Present Illness  47 year old Caucasian female with past medical history of insulin-dependent diabetes mellitus, prior ischemic stroke, obesity presented with weakness of the right arm and right leg.  12 mm acute ischemic nonhemorrhagic infarct involving the left precentral gyrus found on imaging  Clinical Impression  Orders received for PT evaluation. Patient demonstrates deficits in functional mobility as indicated below. Will benefit from continued skilled PT to address deficits and maximize function. Will see as indicated and progress as tolerated.  At this time, patient desires to return home with Home health services for rehabilitation. Feel patient would benefit from maximizing services for rehabilitation and recommending supervision 24/7 upon initial discharge. Patient states understanding of recommendations.     Follow Up Recommendations Home health PT;Supervision/Assistance - 24 hour    Equipment Recommendations  None recommended by PT    Recommendations for Other Services       Precautions / Restrictions Precautions Precautions: Fall Restrictions Weight Bearing Restrictions: No      Mobility  Bed Mobility Overal bed mobility: Needs Assistance Bed Mobility: Supine to Sit     Supine to sit: Min guard     General bed mobility comments: min guard with increased time and effort to come to EOB with HOB elevated  Transfers Overall transfer level: Needs assistance   Transfers: Sit to/from Stand Sit to Stand: Min guard         General transfer comment: min guard for safety and stability  Ambulation/Gait Ambulation/Gait assistance: Min assist Gait Distance (Feet): 110 Feet Assistive device: None Gait Pattern/deviations: Step-through pattern;Decreased stride length;Drifts right/left;Staggering right Gait velocity: decreased Gait  velocity interpretation: <1.8 ft/sec, indicate of risk for recurrent falls General Gait Details: noted instability with ambulation, 3 LOB but able to self correct by reaching out for wall. Patient showed ability to reach with RUE despite weakness reported during assessment. VCs for increased cadence. Min assist for stability  Stairs            Wheelchair Mobility    Modified Rankin (Stroke Patients Only) Modified Rankin (Stroke Patients Only) Pre-Morbid Rankin Score: No symptoms Modified Rankin: Moderate disability     Balance Overall balance assessment: Needs assistance;History of Falls   Sitting balance-Leahy Scale: Good Sitting balance - Comments: able to sit EOB without difficulty     Standing balance-Leahy Scale: Fair Standing balance comment: able to static stand without UE support, decreased righting reactions and noted sway in standing             High level balance activites: Backward walking;Direction changes;Turns;Head turns High Level Balance Comments: min assist for stability, multiple LOB. Cues for safety and attention to task performance. Noted drift to right side with activities             Pertinent Vitals/Pain Pain Assessment: Faces Faces Pain Scale: Hurts little more Pain Location: IV insertion sites Pain Descriptors / Indicators: Sore Pain Intervention(s): Monitored during session    Home Living Family/patient expects to be discharged to:: Private residence Living Arrangements: Alone Available Help at Discharge: Family;Available PRN/intermittently Type of Home: House Home Access: Stairs to enter Entrance Stairs-Rails: Can reach both Entrance Stairs-Number of Steps: 5 Home Layout: One level Home Equipment: None      Prior Function Level of Independence: Independent               Hand Dominance   Dominant Hand: Right  Extremity/Trunk Assessment   Upper Extremity Assessment Upper Extremity Assessment: RUE  deficits/detail RUE Deficits / Details: reports absent strength distally, bicepts/triceps 3+/5 generalized weakness. Some inconsistencies with testing RUE Coordination: decreased fine motor;decreased gross motor    Lower Extremity Assessment Lower Extremity Assessment: RLE deficits/detail;LLE deficits/detail RLE Deficits / Details: decreased effort and inconsistent strength upon testing MMT, able to maintain weight bearing during functional task performance RLE Coordination: decreased fine motor;decreased gross motor(inconsistent) LLE Deficits / Details: noted LLE deficits at baseline due to previous crush injury  LLE Coordination: decreased fine motor;decreased gross motor       Communication   Communication: No difficulties  Cognition Arousal/Alertness: Awake/alert Behavior During Therapy: Flat affect Overall Cognitive Status: Within Functional Limits for tasks assessed                                        General Comments      Exercises     Assessment/Plan    PT Assessment Patient needs continued PT services  PT Problem List Decreased strength;Decreased activity tolerance;Decreased balance;Decreased mobility;Decreased coordination;Decreased cognition;Decreased safety awareness;Pain       PT Treatment Interventions DME instruction;Gait training;Stair training;Functional mobility training;Therapeutic activities;Therapeutic exercise;Balance training;Neuromuscular re-education;Patient/family education    PT Goals (Current goals can be found in the Care Plan section)  Acute Rehab PT Goals Patient Stated Goal: to go home PT Goal Formulation: With patient/family Time For Goal Achievement: 01/11/18 Potential to Achieve Goals: Fair    Frequency Min 3X/week   Barriers to discharge        Co-evaluation PT/OT/SLP Co-Evaluation/Treatment: Yes Reason for Co-Treatment: Complexity of the patient's impairments (multi-system involvement) PT goals addressed  during session: Mobility/safety with mobility OT goals addressed during session: ADL's and self-care       AM-PAC PT "6 Clicks" Daily Activity  Outcome Measure Difficulty turning over in bed (including adjusting bedclothes, sheets and blankets)?: A Little Difficulty moving from lying on back to sitting on the side of the bed? : A Little Difficulty sitting down on and standing up from a chair with arms (e.g., wheelchair, bedside commode, etc,.)?: A Little Help needed moving to and from a bed to chair (including a wheelchair)?: A Little Help needed walking in hospital room?: A Little Help needed climbing 3-5 steps with a railing? : A Lot 6 Click Score: 17    End of Session Equipment Utilized During Treatment: Gait belt Activity Tolerance: Patient tolerated treatment well Patient left: in chair;with call bell/phone within reach;with family/visitor present Nurse Communication: Mobility status PT Visit Diagnosis: Unsteadiness on feet (R26.81);History of falling (Z91.81);Difficulty in walking, not elsewhere classified (R26.2);Other symptoms and signs involving the nervous system (R29.898)    Time: 1610-9604 PT Time Calculation (min) (ACUTE ONLY): 31 min   Charges:   PT Evaluation $PT Eval Moderate Complexity: 1 Mod          Charlotte Crumb, PT DPT  Board Certified Neurologic Specialist [phone]   Fabio Asa 12/28/2017, 3:25 PM

## 2017-12-28 NOTE — Progress Notes (Signed)
Pt taken down via bed for CTA at this time.

## 2017-12-28 NOTE — Progress Notes (Signed)
STROKE TEAM PROGRESS NOTE   HISTORY OF PRESENT ILLNESS (per record) Kristie Tapia is a 47 y.o. female who was last well prior to going to bed around 3 AM. When she awoke at 5:30 AM, she could not move her right arm very well. She says when he arrived in the emergency department after she was outside the IV TPA window and was VAN-negative. The emergency department, an MRI was performed which demonstrates a small acute infarct.  She was previously diagnosed with stroke  After MRI was done for her headaches. It appears that it was present as far back as 2014, but I do not have these images for review.    She follows with Dr. Everlena Cooper for her headaches. He started her on ASA 81mg  in January and stopped triptans after her MRI revealed the lesions to be ischemic rather than cysts as previously supposed.   LKW: 3am tpa given?: no, out of window Premorbid modified rankin scale: 1     SUBJECTIVE (INTERVAL HISTORY) Still feels very weak in the right hand.  No sensory changes.     OBJECTIVE Temp:  [97.9 F (36.6 C)-98.3 F (36.8 C)] 98.3 F (36.8 C) (07/27 0720) Pulse Rate:  [56-70] 62 (07/27 0720) Cardiac Rhythm: Normal sinus rhythm;Heart block (07/27 0700) Resp:  [13-18] 17 (07/27 0720) BP: (97-139)/(43-88) 111/59 (07/27 0720) SpO2:  [96 %-99 %] 98 % (07/27 0720) Weight:  [192 lb (87.1 kg)-197 lb 15.6 oz (89.8 kg)] 197 lb 15.6 oz (89.8 kg) (07/27 0500)  CBC:  Recent Labs  Lab 12/27/17 1405 12/27/17 1410 12/28/17 0445  WBC 9.4  --  10.5  NEUTROABS 6.8  --   --   HGB 15.5* 16.0* 14.8  HCT 48.8* 47.0* 45.7  MCV 89.7  --  87.0  PLT 370  --  329    Basic Metabolic Panel:  Recent Labs  Lab 12/27/17 1405 12/27/17 1410 12/28/17 0445  NA 138 138  --   K 4.5 3.9  --   CL 106 103  --   CO2 26  --   --   GLUCOSE 226* 213*  --   BUN 7 7  --   CREATININE 0.91 0.80 0.86  CALCIUM 8.9  --   --     Lipid Panel:     Component Value Date/Time   CHOL 85 12/28/2017 0445    CHOL 96 (L) 07/06/2017 1442   TRIG 185 (H) 12/28/2017 0445   HDL 30 (L) 12/28/2017 0445   HDL 39 (L) 07/06/2017 1442   CHOLHDL 2.8 12/28/2017 0445   VLDL 37 12/28/2017 0445   LDLCALC 18 12/28/2017 0445   LDLCALC 34 07/06/2017 1442   HgbA1c:  Lab Results  Component Value Date   HGBA1C 8.8 (H) 12/28/2017   Urine Drug Screen: No results found for: LABOPIA, COCAINSCRNUR, LABBENZ, AMPHETMU, THCU, LABBARB  Alcohol Level No results found for: ETH  IMAGING   Ct Angio Head W Or Wo Contrast Ct Angio Neck W Or Wo Contrast 12/28/2017 IMPRESSION:   CTA NECK:  1. No hemodynamically significant stenosis ICA's. Patent vertebral arteries.  2. Moderate to severe RIGHT C3-4 neural foraminal narrowing.   CTA HEAD:  1. No emergent large vessel occlusion or flow-limiting stenosis.  2. Moderate stenoses bilateral MCA and LEFT PCA compatible with atherosclerosis. Aortic Atherosclerosis   Ct Head Wo Contrast 12/27/2017 IMPRESSION:  Stable encephalomalacia on the right similar to that seen on prior MRI examination. No acute intracranial abnormality is noted.  Mr Laqueta Jean And Wo Contrast 12/27/2017 IMPRESSION:  1. 12 mm acute ischemic nonhemorrhagic infarct involving the left precentral gyrus (motor strip).  2. Remote cortical infarcts involving the right frontal and parietal lobes, with additional remote lacunar infarcts involving the right thalamus and cerebellum.     Transthoracic Echocardiogram - pending 00/00/00       PHYSICAL EXAM Vitals:   12/28/17 0246 12/28/17 0400 12/28/17 0500 12/28/17 0720  BP: 131/74 133/77  (!) 111/59  Pulse: (!) 59 63  62  Resp:    17  Temp: 97.9 F (36.6 C)   98.3 F (36.8 C)  TempSrc: Oral   Oral  SpO2: 99% 96%  98%  Weight:   197 lb 15.6 oz (89.8 kg)   Height:        Awake, alert, fully oriented.  Language -fluent, comprehension/naming/repetition - intact Face symmetrical.  Tongue midline. Strength 4/5 right deltoid, biceps, triceps, 1/5  right wrist extension, finger extension, 0/5 right grip.  5/5 LUE. 5/5 BLE.   No babinski. Sensation - intact.          ASSESSMENT/PLAN Ms. Kristie Tapia is a 47 y.o. female with history of anemia, stomach ulcers, anxiety, migraines, OSA, DM, and previous stroke presenting with right arm weakness. She did not receive IV t-PA due to late presentation  Stroke: left precentral gyrus infarct - possibly embolic - source unknown.  Resultant  Right arm weakness  CT head - Stable encephalomalacia on the right   MRI head - 12 mm acute ischemic nonhemorrhagic infarct involving the left precentral gyrus. Multiple remote infarcts.  MRA head - not performed  CTA H&N -  Moderate stenoses bilateral MCA and LEFT PCA  Carotid Doppler - CTA neck performed or pending - carotid dopplers not indicated.  2D Echo - pending  LDL - 18  HgbA1c - 8.8  VTE prophylaxis - [place] Heparin Diet Order           Diet heart healthy/carb modified Room service appropriate? Yes; Fluid consistency: Thin  Diet effective now          aspirin 81 mg daily prior to admission, now on aspirin 81 mg daily and clopidogrel 75 mg daily  Patient counseled to be compliant with her antithrombotic medications  Ongoing aggressive stroke risk factor management  Therapy recommendations:  pending  Disposition:  Pending  Hypertension  Stable . Permissive hypertension (OK if < 220/120) but gradually normalize in 5-7 days . Long-term BP goal normotensive  Hyperlipidemia  Lipid lowering medication PTA:  Crestor 40 mg daily  LDL 18, goal < 70  Current lipid lowering medication: Lipitor 80 mg daily  Continue statin at discharge  Diabetes  HgbA1c 8.8, goal < 7.0  Uncontrolled    Other Stroke Risk Factors  Cigarette smoker-advised to stop smoking  Obesity, Body mass index is 33.98 kg/m., recommend weight loss, diet and exercise as appropriate   Hx stroke/TIA  Migraines  Obstructive sleep  apnea  Other Active Problems     Hospital day # 1  Left frontal subcorticall infarct resulting in right arm weakness.  She was taking ASA daily at home when it happened; Plavix load was given and now on dual therapy.  I recommend continuing dual therapy for 3 weeks, and then transition to Plavix 75 mg qd monotherapy.  Her 2 main uncontrolled risk factors are diabetes and smoking.  The diabetes needs to be adjusted over time as outpatient.  She does not seem too keen on quitting smoking  and declines Nicoderm patch or other oral pill aid.   Her BP is well controlled.  Her cholesterol was very well controlled on statin even on admission.    Her right arm may improved to a large degree over time with extensive physical therapy and occupational therapy.  She prefers outpatient home PT if possible.  At this point, we will sign off and she can follow up in stroke clinic in a month.  Weston SettleShervin Yumalay Circle, MS, MD  To contact Stroke Continuity provider, please refer to [email or web address]. After hours, contact General Neurology

## 2017-12-28 NOTE — Plan of Care (Signed)
Pt received to 3W-15, A&Ox4, able to ambulate from stretcher to bed with standby assistance. Admission database completed, NIHSS =6. Pt has drift/ataxia in right arm, (pronounced) with little to no fine motor skills. Pt's RLE has movement against gravity; both Right extremities have deceased sensation. Pt able to complete test, mild word finding difficulty noted. Pt able to swallow without difficulty per ED report, bedside swallow eval passed. Pt currently in SR on telemetry, now in CT .

## 2017-12-28 NOTE — Evaluation (Addendum)
Occupational Therapy Evaluation Patient Details Name: Kristie Tapia MRN: [id number] DOB: [birth year] Today's Date: 12/28/2017    History of Present Illness 47 year old Caucasian female with past medical history of insulin-dependent diabetes mellitus, prior ischemic stroke, obesity presented with weakness of the right arm and right leg.  12 mm acute ischemic nonhemorrhagic infarct involving the left precentral gyrus found on imaging   Clinical Impression   Pt reports she was independent PTA with hx of falls since L foot sx in 2016. Currently pt requires min assist for functional mobility and min-mod assist for ADL. Pt presenting with decreased RUE ROM, strength, and increased edema and poor standing balance impacting her independence and safety with ADL and functional mobility. Pt planning to d/c home with intermittent supervision from family; declining attempt for CIR level rehab. Recommending HHOT for follow up to maximize independence and safety with ADL and functional mobility upon return home. Pt would benefit from continued skilled OT to address established goals.    Follow Up Recommendations  Home health OT;Supervision/Assistance - 24 hour    Equipment Recommendations  3 in 1 bedside commode    Recommendations for Other Services       Precautions / Restrictions Precautions Precautions: Fall Restrictions Weight Bearing Restrictions: No      Mobility Bed Mobility Overal bed mobility: Needs Assistance Bed Mobility: Supine to Sit     Supine to sit: Min guard     General bed mobility comments: min guard with increased time and effort to come to EOB with HOB elevated  Transfers Overall transfer level: Needs assistance Equipment used: None Transfers: Sit to/from Stand Sit to Stand: Min guard         General transfer comment: min guard for safety and stability    Balance Overall balance assessment: Needs assistance;History of Falls Sitting-balance support: Feet  supported Sitting balance-Leahy Scale: Good Sitting balance - Comments: able to sit EOB without difficulty   Standing balance support: No upper extremity supported Standing balance-Leahy Scale: Fair Standing balance comment: able to static stand without UE support, decreased righting reactions and noted sway in standing                        ADL either performed or assessed with clinical judgement   ADL Overall ADL's : Needs assistance/impaired Eating/Feeding: Set up;Bed level Eating/Feeding Details (indicate cue type and reason): Pt eating lunch upon arrival. Pt using L hand for self feeding Grooming: Minimal assistance;Standing   Upper Body Bathing: Moderate assistance;Sitting   Lower Body Bathing: Minimal assistance;Sit to/from stand   Upper Body Dressing : Moderate assistance;Sitting   Lower Body Dressing: Minimal assistance;Sit to/from stand Lower Body Dressing Details (indicate cue type and reason): pt able to don socks in sitting with light min assist Toilet Transfer: Ambulation;Minimal assistance Toilet Transfer Details (indicate cue type and reason): Simulated by sit to stand from EOB with functional mobility         Functional mobility during ADLs: Minimal assistance General ADL Comments: Discussed importance of functional use of RUE and attempting to purposefully move; pt verbalized understanding     Vision   Vision Assessment?: No apparent visual deficits     Perception     Praxis      Pertinent Vitals/Pain Pain Assessment: Faces Faces Pain Scale: Hurts little more Pain Location: IV insertion sites Pain Descriptors / Indicators: Sore Pain Intervention(s): Monitored during session;Repositioned     Hand Dominance Right   Extremity/Trunk Assessment Upper Extremity Assessment  Upper Extremity Assessment: RUE deficits/detail RUE Deficits / Details: reports absent strength distally, biceps/triceps 3+/5 generalized weakness. Some inconsistencies  with testing. Increased edema noted distally, pt reports sensation intact RUE Coordination: decreased fine motor;decreased gross motor   Lower Extremity Assessment Lower Extremity Assessment: Defer to PT evaluation    Cervical / Trunk Assessment Cervical / Trunk Assessment: Normal   Communication Communication Communication: No difficulties   Cognition Arousal/Alertness: Awake/alert Behavior During Therapy: Flat affect Overall Cognitive Status: Within Functional Limits for tasks assessed                                     General Comments       Exercises     Shoulder Instructions      Home Living Family/patient expects to be discharged to:: Private residence Living Arrangements: Alone Available Help at Discharge: Family;Available PRN/intermittently Type of Home: House Home Access: Stairs to enter Entergy CorporationEntrance Stairs-Number of Steps: 5 Entrance Stairs-Rails: Can reach both Home Layout: One level     Bathroom Shower/Tub: Chief Strategy OfficerTub/shower unit   Bathroom Toilet: Standard     Home Equipment: None   Additional Comments: Pt reports she only takes baths--refuses use of equipment      Prior Functioning/Environment Level of Independence: Independent                 OT Problem List: Decreased strength;Decreased range of motion;Impaired balance (sitting and/or standing);Decreased coordination;Decreased knowledge of use of DME or AE;Impaired UE functional use;Increased edema      OT Treatment/Interventions: Self-care/ADL training;Therapeutic exercise;Neuromuscular education;Energy conservation;DME and/or AE instruction;Therapeutic activities;Patient/family education;Balance training    OT Goals(Current goals can be found in the care plan section) Acute Rehab OT Goals Patient Stated Goal: to go home OT Goal Formulation: With patient Time For Goal Achievement: 01/11/18 Potential to Achieve Goals: Good ADL Goals Pt Will Perform Eating: with modified  independence;sitting Pt Will Perform Grooming: with modified independence;standing Pt Will Perform Upper Body Bathing: with modified independence;sitting Pt Will Perform Lower Body Bathing: with modified independence;sit to/from stand Pt Will Perform Tub/Shower Transfer: Tub transfer;with modified independence;ambulating;3 in 1 Pt/caregiver will Perform Home Exercise Program: Right Upper extremity;Increased ROM;Increased strength;With written HEP provided;Independently  OT Frequency: Min 2X/week   Barriers to D/C: Decreased caregiver support          Co-evaluation PT/OT/SLP Co-Evaluation/Treatment: Yes Reason for Co-Treatment: Complexity of the patient's impairments (multi-system involvement) PT goals addressed during session: Mobility/safety with mobility OT goals addressed during session: ADL's and self-care      AM-PAC PT "6 Clicks" Daily Activity     Outcome Measure Help from another person eating meals?: A Little Help from another person taking care of personal grooming?: A Lot Help from another person toileting, which includes using toliet, bedpan, or urinal?: A Little Help from another person bathing (including washing, rinsing, drying)?: A Lot Help from another person to put on and taking off regular upper body clothing?: A Lot Help from another person to put on and taking off regular lower body clothing?: A Little 6 Click Score: 15   End of Session    Activity Tolerance: Patient tolerated treatment well Patient left: in chair;with call bell/phone within reach;with family/visitor present  OT Visit Diagnosis: Unsteadiness on feet (R26.81);History of falling (Z91.81);Muscle weakness (generalized) (M62.81);Hemiplegia and hemiparesis Hemiplegia - Right/Left: Right Hemiplegia - dominant/non-dominant: Dominant  Time: 4098-1191 OT Time Calculation (min): 30 min Charges:  OT General Charges $OT Visit: 1 Visit OT Evaluation $OT Eval Moderate Complexity: 1  Mod  Jeymi Hepp A. Brett Albino, M.S., OTR/L Acute Rehab Department: [phone]  Gaye Alken 12/28/2017, 4:08 PM

## 2017-12-28 NOTE — Progress Notes (Signed)
OT Cancellation Note  Patient Details Name: Jerae Piscitello MRN: [id number] DOB:Sharia Reeve 1970/07/01   Cancelled Treatment:    Reason Eval/Treat Not Completed: Patient at procedure or test/ unavailable  Jeneen RinksBailey A Antawn Sison A. Brett Albinooffey, M.S., OTR/L Acute Rehab Department: [phone]  12/28/2017, 11:32 AM

## 2017-12-29 LAB — GLUCOSE, CAPILLARY
GLUCOSE-CAPILLARY: 162 mg/dL — AB (ref 70–99)
Glucose-Capillary: 117 mg/dL — ABNORMAL HIGH (ref 70–99)
Glucose-Capillary: 137 mg/dL — ABNORMAL HIGH (ref 70–99)

## 2017-12-29 LAB — BASIC METABOLIC PANEL
Anion gap: 8 (ref 5–15)
BUN: 7 mg/dL (ref 6–20)
CHLORIDE: 107 mmol/L (ref 98–111)
CO2: 22 mmol/L (ref 22–32)
CREATININE: 0.79 mg/dL (ref 0.44–1.00)
Calcium: 8.6 mg/dL — ABNORMAL LOW (ref 8.9–10.3)
GFR calc Af Amer: >60 mL/min (ref >60)
GFR calc non Af Amer: >60 mL/min (ref >60)
Glucose, Bld: 169 mg/dL — ABNORMAL HIGH (ref 70–99)
Potassium: 3.7 mmol/L (ref 3.5–5.1)
SODIUM: 137 mmol/L (ref 135–145)

## 2017-12-29 LAB — CBC
HCT: 44.1 % (ref 36.0–46.0)
Hemoglobin: 14.4 g/dL (ref 12.0–15.0)
MCH: 28.8 pg (ref 26.0–34.0)
MCHC: 32.7 g/dL (ref 30.0–36.0)
MCV: 88.2 fL (ref 78.0–100.0)
Platelets: 337 10*3/uL (ref 150–400)
RBC: 5 MIL/uL (ref 3.87–5.11)
RDW: 16.4 % — AB (ref 11.5–15.5)
WBC: 9.9 10*3/uL (ref 4.0–10.5)

## 2017-12-29 LAB — ECHOCARDIOGRAM COMPLETE
Height: 64 in
Weight: 3167.57 oz

## 2017-12-29 MED ORDER — CLOPIDOGREL BISULFATE 75 MG PO TABS: 75.0000 mg | ORAL_TABLET | Freq: Every day | ORAL | 2 refills | Status: DC

## 2017-12-29 MED ORDER — ASPIRIN EC 81 MG PO TBEC: 81.0000 mg | DELAYED_RELEASE_TABLET | Freq: Every day | ORAL | 0 refills | Status: AC

## 2017-12-29 MED ORDER — GABAPENTIN 100 MG PO CAPS
100.0000 mg | ORAL_CAPSULE | Freq: Every day | ORAL | Status: DC
  Administered 2017-12-29: 100 mg via ORAL
  Filled 2017-12-29: qty 1

## 2017-12-29 NOTE — Discharge Summary (Signed)
[facility]  Physician Discharge Summary   Patient ID: Kristie Tapia MRN: [id number] DOB/AGE: 08/14/1970 47 y.o.  Admit date: 12/27/2017 Discharge date: 12/29/2017  PCP: Tereasa Coop, PA-C  DISCHARGE DIAGNOSES:  Acute stroke with right hemiparesis, improving Diabetes mellitus type 2 with peripheral neuropathy   RECOMMENDATIONS FOR OUTPATIENT FOLLOW UP: 1. Home health has been ordered for PT and OT 2. Referral sent to neurology for follow-up in 4 weeks   DISCHARGE CONDITION: fair  Diet recommendation: Modified carbohydrate  Filed Weights   12/27/17 1338 12/28/17 0500  Weight: 87.1 kg (192 lb) 89.8 kg (197 lb 15.6 oz)    INITIAL HISTORY: 47 year old Caucasian female with past medical history of insulin-dependent diabetes mellitus, prior ischemic stroke, obesity presented with weakness of the right arm and right leg.  MRI raised concern for acute stroke.  She was hospitalized for further management.  Consultants: Neurology  Procedures:  Transthoracic echocardiogram Study Conclusions  - Left ventricle: The cavity size was normal. Wall thickness was   increased in a pattern of mild LVH. Systolic function was normal.   The estimated ejection fraction was in the range of 55% to 60%.   Wall motion was normal; there were no regional wall motion   abnormalities. Doppler parameters are consistent with abnormal   left ventricular relaxation (grade 1 diastolic dysfunction). The   E/e&' ratio is <8, suggesting normal LV filling pressure. - Left atrium: The atrium was normal in size. - Inferior vena cava: The vessel was normal in size. The   respirophasic diameter changes were in the normal range (>= 50%),   consistent with normal central venous pressure. Impressions: - LVEF 55-60%, mild LVH, normal wall motion, grade 1 DD, normal LV   filling pressure, normal LA size, normal IVC.    HOSPITAL COURSE:   Acute stroke with right hemiparesis Patient was  outside the window for TPA.  Sinus rhythm on EKG.  MRI brain as above.  CT angiogram head and neck did not show any significant stenosis.  Echocardiogram is as above.  It appears that patient was given loading dose of Plavix.  Currently on 75 mg of Plavix and aspirin 81 mg daily.  Also on Lipitor.  LDL noted to be 18.  HbA1c 8.8.  Seen by PT and OT and home health has been recommended.  Per neurology she is to take aspirin and Plavix for 3 weeks followed by Plavix alone.  Diabetes mellitus type 2 with peripheral neuropathy Apparently uses insulin pump at home which was removed in the hospital.    She was given Lantus.  HbA1c is 8.8.  She may resume her home regimen.    Tobacco abuse Smoking cessation counseling was provided.  Morbid obesity BMI 33.98.  Generalized anxiety disorder Continue home medications.  Overall stable.  Okay for discharge home.    PERTINENT LABS:  The results of significant diagnostics from this hospitalization (including imaging, microbiology, ancillary and laboratory) are listed below for reference.     Labs: Basic Metabolic Panel: Recent Labs  Lab 12/27/17 1405 12/27/17 1410 12/28/17 0445 12/29/17 0621  NA 138 138  --  137  K 4.5 3.9  --  3.7  CL 106 103  --  107  CO2 26  --   --  22  GLUCOSE 226* 213*  --  169*  BUN 7 7  --  7  CREATININE 0.91 0.80 0.86 0.79  CALCIUM 8.9  --   --  8.6*   Liver Function Tests:  Recent Labs  Lab 12/27/17 1405  AST 23  ALT 17  ALKPHOS 60  BILITOT 1.2  PROT 6.6  ALBUMIN 3.8   CBC: Recent Labs  Lab 12/27/17 1405 12/27/17 1410 12/28/17 0445 12/29/17 0621  WBC 9.4  --  10.5 9.9  NEUTROABS 6.8  --   --   --   HGB 15.5* 16.0* 14.8 14.4  HCT 48.8* 47.0* 45.7 44.1  MCV 89.7  --  87.0 88.2  PLT 370  --  329 337   CBG: Recent Labs  Lab 12/28/17 1202 12/28/17 1655 12/28/17 2125 12/29/17 0626 12/29/17 1152  GLUCAP 173* 267* 192* 162* 117*     IMAGING STUDIES Ct Angio Head W Or Wo  Contrast  Result Date: 12/28/2017 CLINICAL DATA:  RIGHT extremity weakness, follow-up stroke. History of diabetes, migraines. EXAM: CT ANGIOGRAPHY HEAD AND NECK TECHNIQUE: Multidetector CT imaging of the head and neck was performed using the standard protocol during bolus administration of intravenous contrast. Multiplanar CT image reconstructions and MIPs were obtained to evaluate the vascular anatomy. Carotid stenosis measurements (when applicable) are obtained utilizing NASCET criteria, using the distal internal carotid diameter as the denominator. CONTRAST:  4m ISOVUE-370 IOPAMIDOL (ISOVUE-370) INJECTION 76% COMPARISON:  MRI head December 27, 2017 FINDINGS: CTA NECK FINDINGS: AORTIC ARCH: Normal appearance of the thoracic arch, normal branch pattern. Trace calcific atherosclerosis aortic arch. The origins of the innominate, left Common carotid artery and subclavian artery are patent. Mild intimal thickening proximal LEFT subclavian artery. RIGHT CAROTID SYSTEM: Common carotid artery is patent. Trace calcific atherosclerosis of the carotid bifurcation without hemodynamically significant stenosis by NASCET criteria. Normal appearance of the internal carotid artery. LEFT CAROTID SYSTEM: Common carotid artery is patent. Normal appearance of the carotid bifurcation without hemodynamically significant stenosis by NASCET criteria. Normal appearance of the internal carotid artery. VERTEBRAL ARTERIES:Left vertebral artery is dominant. Normal appearance of the vertebral arteries, widely patent. SKELETON: No acute osseous process though bone windows have not been submitted. OTHER NECK: Soft tissues of the neck are nonacute though, not tailored for evaluation. Heterogeneous thyroid without dominant nodule. Uncovertebral hypertrophy and facet arthropathy resulting in moderate to severe RIGHT C3-4 neural foraminal narrowing. UPPER CHEST: Included lung apices are clear. No superior mediastinal lymphadenopathy. CTA HEAD  FINDINGS: ANTERIOR CIRCULATION: Patent cervical internal carotid arteries, petrous, cavernous and supra clinoid internal carotid arteries. Patent anterior communicating artery. Patent anterior and middle cerebral arteries, moderate tandem stenosis mid to distal bilateral MCA's. No large vessel occlusion, significant stenosis, contrast extravasation or aneurysm. POSTERIOR CIRCULATION: Patent vertebral arteries, vertebrobasilar junction and basilar artery, as well as main branch vessels. Patent posterior cerebral arteries, moderate stenosis LEFT P2 segment. No large vessel occlusion, significant stenosis, contrast extravasation or aneurysm. VENOUS SINUSES: Major dural venous sinuses are patent though not tailored for evaluation on this angiographic examination. ANATOMIC VARIANTS: None. DELAYED PHASE: No abnormal intracranial enhancement. MIP images reviewed. IMPRESSION: CTA NECK: 1. No hemodynamically significant stenosis ICA's. Patent vertebral arteries. 2. Moderate to severe RIGHT C3-4 neural foraminal narrowing. CTA HEAD: 1. No emergent large vessel occlusion or flow-limiting stenosis. 2. Moderate stenoses bilateral MCA and LEFT PCA compatible with atherosclerosis. Aortic Atherosclerosis (ICD10-I70.0). Electronically Signed   By: CElon AlasM.D.   On: 12/28/2017 02:48   Dg Chest 2 View  Result Date: 12/01/2017 CLINICAL DATA:  47year old female with chest pressure. EXAM: CHEST - 2 VIEW COMPARISON:  Chest radiograph dated 08/03/2015 FINDINGS: The heart size and mediastinal contours are within normal limits. Both lungs are clear. The  visualized skeletal structures are unremarkable. IMPRESSION: No active cardiopulmonary disease. Electronically Signed   By: Anner Crete M.D.   On: 12/01/2017 21:08   Ct Head Wo Contrast  Result Date: 12/27/2017 CLINICAL DATA:  Decreased function in the right arm EXAM: CT HEAD WITHOUT CONTRAST TECHNIQUE: Contiguous axial images were obtained from the base of the skull  through the vertex without intravenous contrast. COMPARISON:  04/02/2017 FINDINGS: Brain: Stable encephalomalacia changes are identified in right frontal and right posterior parietal lobes. No findings to suggest acute hemorrhage, acute infarction or space-occupying mass lesion are noted. Vascular: No hyperdense vessel or unexpected calcification. Skull: Normal. Negative for fracture or focal lesion. Sinuses/Orbits: No acute finding. Other: None. IMPRESSION: Stable encephalomalacia on the right similar to that seen on prior MRI examination. No acute intracranial abnormality is noted. Electronically Signed   By: Inez Catalina M.D.   On: 12/27/2017 14:26   Ct Angio Neck W Or Wo Contrast  Result Date: 12/28/2017 CLINICAL DATA:  RIGHT extremity weakness, follow-up stroke. History of diabetes, migraines. EXAM: CT ANGIOGRAPHY HEAD AND NECK TECHNIQUE: Multidetector CT imaging of the head and neck was performed using the standard protocol during bolus administration of intravenous contrast. Multiplanar CT image reconstructions and MIPs were obtained to evaluate the vascular anatomy. Carotid stenosis measurements (when applicable) are obtained utilizing NASCET criteria, using the distal internal carotid diameter as the denominator. CONTRAST:  28m ISOVUE-370 IOPAMIDOL (ISOVUE-370) INJECTION 76% COMPARISON:  MRI head December 27, 2017 FINDINGS: CTA NECK FINDINGS: AORTIC ARCH: Normal appearance of the thoracic arch, normal branch pattern. Trace calcific atherosclerosis aortic arch. The origins of the innominate, left Common carotid artery and subclavian artery are patent. Mild intimal thickening proximal LEFT subclavian artery. RIGHT CAROTID SYSTEM: Common carotid artery is patent. Trace calcific atherosclerosis of the carotid bifurcation without hemodynamically significant stenosis by NASCET criteria. Normal appearance of the internal carotid artery. LEFT CAROTID SYSTEM: Common carotid artery is patent. Normal appearance of  the carotid bifurcation without hemodynamically significant stenosis by NASCET criteria. Normal appearance of the internal carotid artery. VERTEBRAL ARTERIES:Left vertebral artery is dominant. Normal appearance of the vertebral arteries, widely patent. SKELETON: No acute osseous process though bone windows have not been submitted. OTHER NECK: Soft tissues of the neck are nonacute though, not tailored for evaluation. Heterogeneous thyroid without dominant nodule. Uncovertebral hypertrophy and facet arthropathy resulting in moderate to severe RIGHT C3-4 neural foraminal narrowing. UPPER CHEST: Included lung apices are clear. No superior mediastinal lymphadenopathy. CTA HEAD FINDINGS: ANTERIOR CIRCULATION: Patent cervical internal carotid arteries, petrous, cavernous and supra clinoid internal carotid arteries. Patent anterior communicating artery. Patent anterior and middle cerebral arteries, moderate tandem stenosis mid to distal bilateral MCA's. No large vessel occlusion, significant stenosis, contrast extravasation or aneurysm. POSTERIOR CIRCULATION: Patent vertebral arteries, vertebrobasilar junction and basilar artery, as well as main branch vessels. Patent posterior cerebral arteries, moderate stenosis LEFT P2 segment. No large vessel occlusion, significant stenosis, contrast extravasation or aneurysm. VENOUS SINUSES: Major dural venous sinuses are patent though not tailored for evaluation on this angiographic examination. ANATOMIC VARIANTS: None. DELAYED PHASE: No abnormal intracranial enhancement. MIP images reviewed. IMPRESSION: CTA NECK: 1. No hemodynamically significant stenosis ICA's. Patent vertebral arteries. 2. Moderate to severe RIGHT C3-4 neural foraminal narrowing. CTA HEAD: 1. No emergent large vessel occlusion or flow-limiting stenosis. 2. Moderate stenoses bilateral MCA and LEFT PCA compatible with atherosclerosis. Aortic Atherosclerosis (ICD10-I70.0). Electronically Signed   By: CElon Alas M.D.   On: 12/28/2017 02:48   Mr BJeri CosAnd  Wo Contrast  Result Date: 12/27/2017 CLINICAL DATA:  Initial evaluation for acute right-sided weakness. EXAM: MRI HEAD WITHOUT AND WITH CONTRAST TECHNIQUE: Multiplanar, multiecho pulse sequences of the brain and surrounding structures were obtained without and with intravenous contrast. CONTRAST:  29m MULTIHANCE GADOBENATE DIMEGLUMINE 529 MG/ML IV SOLN COMPARISON:  Prior CT from earlier same day as well as previous MRI from 04/02/2017 FINDINGS: Brain: Diffuse prominence of the CSF containing spaces compatible with generalized cerebral atrophy. Multifocal areas of restricted diffusion involving the right frontal and parietal lobes, compatible with remote ischemic infarcts. Small remote lacunar infarcts seen involving the right thalamus and right cerebellar hemisphere. Changes relatively stable from previous. There is a 12 mm acute ischemic infarct involving the cortical gray matter of the left precentral gyrus, motor strip (series 5, image 83). No associated hemorrhage or mass effect. No other evidence for acute or subacute ischemia. Gray-white matter differentiation otherwise maintained. No other evidence for acute or chronic intracranial hemorrhage. No mass lesion, midline shift or mass effect. No hydrocephalus. No extra-axial fluid collection. No abnormal enhancement. Vascular: Major intracranial vascular flow voids are maintained at the skull base. Skull and upper cervical spine: Craniocervical junction within normal limits. Upper cervical spine normal. No focal marrow replacing lesion. Sinuses/Orbits: Globes and orbital soft tissues within normal limits. Paranasal sinuses are clear. No significant mastoid effusion. Other: None. IMPRESSION: 1. 12 mm acute ischemic nonhemorrhagic infarct involving the left precentral gyrus (motor strip). 2. Remote cortical infarcts involving the right frontal and parietal lobes, with additional remote lacunar infarcts involving the  right thalamus and cerebellum. Electronically Signed   By: BJeannine BogaM.D.   On: 12/27/2017 22:05    DISCHARGE EXAMINATION: Vitals:   12/28/17 1630 12/28/17 2012 12/28/17 2334 12/29/17 0730  BP: 131/74 130/80 131/62 127/73  Pulse: 75 65 65 70  Resp: 16 20 20 15   Temp: 98.7 F (37.1 C) 98.3 F (36.8 C) 98.1 F (36.7 C)   TempSrc: Oral Oral Oral   SpO2: 98% 97% 96% 98%  Weight:      Height:       General appearance: alert, cooperative, appears stated age and no distress Resp: clear to auscultation bilaterally Cardio: regular rate and rhythm, S1, S2 normal, no murmur, click, rub or gallop GI: soft, non-tender; bowel sounds normal; no masses,  no organomegaly  DISPOSITION: Home with home health  Discharge Instructions    Ambulatory referral to Neurology   Complete by:  As directed    An appointment is requested in approximately: 4 weeks   Call MD for:  difficulty breathing, headache or visual disturbances   Complete by:  As directed    Call MD for:  extreme fatigue   Complete by:  As directed    Call MD for:  persistant dizziness or light-headedness   Complete by:  As directed    Call MD for:  persistant nausea and vomiting   Complete by:  As directed    Call MD for:  severe uncontrolled pain   Complete by:  As directed    Call MD for:  temperature >100.4   Complete by:  As directed    Diet - low sodium heart healthy   Complete by:  As directed    Discharge instructions   Complete by:  As directed    Please be sure to follow-up with your primary care provider within 1 week.  Take your medications as prescribed.  You were cared for by a hospitalist during your hospital stay.  If you have any questions about your discharge medications or the care you received while you were in the hospital after you are discharged, you can call the unit and asked to speak with the hospitalist on call if the hospitalist that took care of you is not available. Once you are discharged,  your primary care physician will handle any further medical issues. Please note that NO REFILLS for any discharge medications will be authorized once you are discharged, as it is imperative that you return to your primary care physician (or establish a relationship with a primary care physician if you do not have one) for your aftercare needs so that they can reassess your need for medications and monitor your lab values. If you do not have a primary care physician, you can call [phone] for a physician referral.   Increase activity slowly   Complete by:  As directed         Allergies as of 12/29/2017      Reactions   Adhesive [tape] Other (See Comments)   Blisters USE PAPER TAPE ONLY   Sulfa Antibiotics Other (See Comments)   Burns the inside of my mouth; "blisters; leaves tongue and inside of mouth solid red"   Decongestant [pseudoephedrine Hcl Er] Itching   ALL DECONGESTANTS   Prednisone Other (See Comments)   Gives me a bad attitude   Percocet [oxycodone-acetaminophen] Other (See Comments)   PT reports having nightmares when taking Percocet   Zoloft [sertraline Hcl]    Latex Itching      Medication List    TAKE these medications   aspirin EC 81 MG tablet Take 1 tablet (81 mg total) by mouth daily for 21 days. For 3 weeks only. What changed:  additional instructions   clopidogrel 75 MG tablet Commonly known as:  PLAVIX Take 1 tablet (75 mg total) by mouth daily. Start taking on:  12/30/2017   escitalopram 20 MG tablet Commonly known as:  LEXAPRO Take 1 tablet (20 mg total) by mouth daily.   fluticasone 50 MCG/ACT nasal spray Commonly known as:  FLONASE Place 2 sprays into both nostrils daily. What changed:    when to take this  reasons to take this   FREESTYLE LIBRE [place] 1 each by Does not apply route every 14 (fourteen) days.   furosemide 20 MG tablet Commonly known as:  LASIX Take 0.5 tablets (10 mg total) by mouth daily. For relief of  swelling. What changed:    how much to take  when to take this  additional instructions   gabapentin 100 MG capsule Commonly known as:  NEURONTIN Take 100-200 mg by mouth See admin instructions. Take 1 capsule every morning then take 2 capsules every evening   glimepiride 2 MG tablet Commonly known as:  AMARYL Take 1 tablet (2 mg total) by mouth daily before breakfast.   LORazepam 1 MG tablet Commonly known as:  ATIVAN Take 1 tablet (1 mg total) by mouth 2 (two) times daily as needed for anxiety (For acute panic only. No more than 60 tabs in one year from 07/06/17).   metoCLOPramide 10 MG tablet Commonly known as:  REGLAN Take 1 tablet (10 mg total) by mouth every 6 (six) hours as needed for nausea.   nitroGLYCERIN 0.4 MG SL tablet Commonly known as:  NITROSTAT Place 0.4 mg under the tongue every 5 (five) minutes as needed for chest pain.   pantoprazole 40 MG tablet Commonly known as:  PROTONIX Take 40  mg by mouth 2 (two) times daily.   promethazine 25 MG tablet Commonly known as:  PHENERGAN Take 25 mg by mouth every 6 (six) hours as needed for nausea.   rosuvastatin 40 MG tablet Commonly known as:  CRESTOR Take 1 tablet (40 mg total) by mouth daily.   tiZANidine 4 MG tablet Commonly known as:  ZANAFLEX Take 1 tablet (4 mg total) by mouth every 6 (six) hours as needed for muscle spasms.   QUDEXY XR Cs24 sprinkle capsule Generic drug:  topiramate ER Take 100 mg by mouth every evening.   topiramate ER Cs24 sprinkle capsule Commonly known as:  QUDEXY XR Take 1 capsule (100 mg total) by mouth at bedtime.   traZODone 100 MG tablet Commonly known as:  DESYREL Take 1-1.5 tablets (100-150 mg total) by mouth at bedtime. What changed:  how much to take   V-GO 40 Kit 1 Device by Does not apply route daily.        Follow-up Information    Tereasa Coop, PA-C. Schedule an appointment as soon as possible for a visit in 1 week(s).   Specialty:  Urgent  Care Contact information: [place] [id number] [phone]           TOTAL DISCHARGE TIME: 7 mins  Bonnielee Haff  [facility] Pager [phone]  12/29/2017, 1:33 PM

## 2017-12-29 NOTE — Care Management Note (Signed)
Case Management Note  Patient Details  Name: Kristie Tapia MRN: [id number] Date of Birth: Oct 18, 1970  Subjective/Objective:   Pt presented for CVA and right sided weakness.  [facility]                Action/Plan: Pt has no preference of HH agency.  After [facility], [facility], [facility].  HH difficult to find due to patient's insurance plan and lack of OT staff in the area.  Start of care within 48 hours.    Expected Discharge Date:  12/29/17               Expected Discharge Plan:  Home w Home Health Services  In-House Referral:  NA  Discharge planning Services  CM Consult  Post Acute Care Choice:  Home Health Choice offered to:  Patient  DME Arranged:  3-N-1 DME Agency:  [facility]  [facility]  [facility]  Status of Service:  Completed, signed off  If discussed at Long Length of Stay Meetings, dates discussed:    Additional Comments:  Deveron Furlongshley  Imaya Duffy, RN 12/29/2017, 2:59 PM

## 2017-12-29 NOTE — Progress Notes (Signed)
Discharge: Pt d/c from room via wheelchair, Family member with the pt. Discharge instructions given to the patient and family members.  No questions from pt, reintegrated to the pt to call or go to the ED for chest discomfort. Pt dressed in street clothes and left with discharge papers and prescriptions in hand. IV d/ced, tele removed and no complaints of pain or discomfort. 

## 2017-12-29 NOTE — Progress Notes (Signed)
Pt alert and oriented x4, no complaints of pain or discomfort.  Bed in low position, call bell within reach.  Bed alarms on and functioning.  Assessment done and charted.  Will continue to monitor and do hourly rounding throughout the shift 

## 2017-12-29 NOTE — Discharge Instructions (Signed)
Ischemic Stroke °An ischemic stroke is the sudden death of brain tissue. Blood carries oxygen to all areas of the body. This type of stroke happens when your blood does not flow to your brain like normal. Your brain cannot get the oxygen it needs. This is an emergency. It must be treated right away. °Symptoms of a stroke usually happen all of a sudden. You may notice them when you wake up. They can include: °· Weakness or loss of feeling in your face, arm, or leg. This often happens on one side of the body. °· Trouble walking. °· Trouble moving your arms or legs. °· Loss of balance or coordination. °· Feeling confused. °· Trouble talking or understanding what people are saying. °· Slurred speech. °· Trouble seeing. °· Seeing two of one object (double vision). °· Feeling dizzy. °· Feeling sick to your stomach (nauseous) and throwing up (vomiting). °· A very bad headache for no reason. ° °Get help as soon as any of these problems start. This is important. Some treatments work better if they are given right away. These include: °· Aspirin. °· Medicines to control blood pressure. °· A shot (injection) of medicine to break up the blood clot. °· Treatments given in the blood vessel (artery) to take out the clot or break it up. ° °Other treatments may include: °· Oxygen. °· Fluids given through an IV tube. °· Medicines to thin out your blood. °· Procedures to help your blood flow better. ° °What increases the risk? °Certain things may make you more likely to have a stroke. Some of these are things that you can change, such as: °· Being very overweight (obesity). °· Smoking. °· Taking birth control pills. °· Not being active. °· Drinking too much alcohol. °· Using drugs. ° °Other risk factors include: °· High blood pressure. °· High cholesterol. °· Diabetes. °· Heart disease. °· Being African American, Native American, Hispanic, or [place] Native. °· Being over age 60. °· Family history of stroke. °· Having had blood clots,  stroke, or warning stroke (transient ischemic attack, TIA) in the past. °· Sickle cell disease. °· Being a woman with a history of high blood pressure in pregnancy (preeclampsia). °· Migraine headache. °· Sleep apnea. °· Having an irregular heartbeat (atrial fibrillation). °· Long-term (chronic) diseases that cause soreness and swelling (inflammation). °· Disorders that affect how your blood clots. ° °Follow these instructions at home: °Medicines °· Take over-the-counter and prescription medicines only as told by your doctor. °· If you were told to take aspirin or another medicine to thin your blood, take it exactly as told by your doctor. °? Taking too much of the medicine can cause bleeding. °? If you do not take enough, it may not work as well. °· Know the side effects of your medicines. If you are taking a blood thinner, make sure you: °? Hold pressure over any cuts for longer than usual. °? Tell your dentist and other doctors that you take this medicine. °? Avoid activities that may cause damage or injury to your body. °Eating and drinking °· Follow instructions from your doctor about what you cannot eat or drink. °· Eat healthy foods. °· If you have trouble with swallowing, do these things to avoid choking: °? Take small bites when eating. °? Eat foods that are soft or pureed. °Safety °· Follow instructions from your health care team about physical activity. °· Use a walker or cane as told by your doctor. °· Keep your home safe so   you do not fall. This may include: °? Having experts look at your home to make sure it is safe. °? Putting grab bars in the bedroom and bathroom. °? Using raised toilets. °? Putting a seat in the shower. °General instructions °· Do not use any tobacco products. °? Examples of these are cigarettes, chewing tobacco, and e-cigarettes. °? If you need help quitting, ask your doctor. °· Limit how much alcohol you drink. This means no more than 1 drink a day for nonpregnant women and 2  drinks a day for men. One drink equals 12 oz of beer, 5 oz of wine, or 1½ oz of hard liquor. °· If you need help to stop using drugs or alcohol, ask your doctor to refer you to a program or specialist. °· Stay active. Exercise as told by your doctor. °· Keep all follow-up visits as told by your doctor. This is important. °Get help right away if: °· You suddenly: °? Have weakness or loss of feeling in your face, arm, or leg. °? Feel confused. °? Have trouble talking or understanding what people are saying. °? Have trouble seeing. °? Have trouble walking. °? Have trouble moving your arms or legs. °? Feel dizzy. °? Lose your balance or coordination. °? Have a very bad headache and you do not know why. °· You pass out (lose consciousness) or almost pass out. °· You have jerky movements that you cannot control (seizure). °These symptoms may be an emergency. Do not wait to see if the symptoms will go away. Get medical help right away. Call your local emergency services (911 in the [place] Do not drive yourself to the hospital. °This information is not intended to replace advice given to you by your health care provider. Make sure you discuss any questions you have with your health care provider. °Document Released: 05/10/2011 Document Revised: 11/01/2015 Document Reviewed: 08/17/2015 °Elsevier Interactive Patient Education © 2018 [employer] Inc[employer] ° °

## 2017-12-30 ENCOUNTER — Telehealth: Payer: Self-pay | Admitting: Emergency Medicine

## 2017-12-30 NOTE — Telephone Encounter (Signed)
FYI- Pt called and resheduled her appt. She had a stroke 12/27/17 and was release from the hospital yesterday. Thanks.

## 2017-12-30 NOTE — Telephone Encounter (Signed)
Just an FYI

## 2017-12-31 ENCOUNTER — Ambulatory Visit: Payer: BLUE CROSS/BLUE SHIELD | Admitting: Endocrinology

## 2017-12-31 ENCOUNTER — Telehealth: Payer: Self-pay

## 2017-12-31 NOTE — Telephone Encounter (Signed)
patient asking if PA has been done for the v-go 40 because she is on last pod- she does have some 30's she could use if she has to- please call the patient back to let her know if paper work has been sent- good call back number is [phone]

## 2017-12-31 NOTE — Telephone Encounter (Signed)
Patient was called telling her that I contacted [employer] and they needed a formulary exception form filled out and this was done and put on Dr. George HughEllison's desk to sign.  I will fax it after he signs it.

## 2018-01-01 ENCOUNTER — Ambulatory Visit: Payer: BLUE CROSS/BLUE SHIELD | Admitting: Physician Assistant

## 2018-01-01 ENCOUNTER — Encounter: Payer: Self-pay | Admitting: Physician Assistant

## 2018-01-01 ENCOUNTER — Other Ambulatory Visit: Payer: Self-pay

## 2018-01-01 VITALS — BP 117/72 | HR 83 | Temp 97.8°F | Resp 16 | Ht 64.0 in | Wt 190.0 lb

## 2018-01-01 DIAGNOSIS — R29898 Other symptoms and signs involving the musculoskeletal system: Secondary | ICD-10-CM

## 2018-01-01 DIAGNOSIS — Z0271 Encounter for disability determination: Secondary | ICD-10-CM

## 2018-01-01 MED ORDER — CLOPIDOGREL BISULFATE 75 MG PO TABS: 75.0000 mg | ORAL_TABLET | Freq: Every day | ORAL | 2 refills | Status: DC

## 2018-01-01 MED ORDER — ALBUTEROL SULFATE HFA 108 (90 BASE) MCG/ACT IN AERS: 2.0000 | INHALATION_SPRAY | Freq: Four times a day (QID) | RESPIRATORY_TRACT | 0 refills | Status: DC | PRN

## 2018-01-01 MED ORDER — NICOTINE POLACRILEX 4 MG MT GUM: 4.0000 mg | CHEWING_GUM | OROMUCOSAL | 1 refills | Status: DC | PRN

## 2018-01-01 NOTE — Patient Instructions (Signed)
     IF you received an x-ray today, you will receive an invoice from [facility]. Please contact [facility] at [phone] with questions or concerns regarding your invoice.   IF you received labwork today, you will receive an invoice from [facility]. Please contact [facility] at [phone] with questions or concerns regarding your invoice.   Our billing staff will not be able to assist you with questions regarding bills from these companies.  You will be contacted with the lab results as soon as they are available. The fastest way to get your results is to activate your My Chart account. Instructions are located on the last page of this paperwork. If you have not heard from us regarding the results in 2 weeks, please contact this office.     

## 2018-01-01 NOTE — Progress Notes (Signed)
hosp

## 2018-01-01 NOTE — Progress Notes (Signed)
01/07/2018 12:52 PM   DOB: 1970/07/23 / MRN: [id number]  SUBJECTIVE:  Kristie Tapia is a 47 y.o. female presenting for hospital follow up. She had a stroke.  Long history of diabetes poorly controlled, smoker not motivated to quit, no history of HTN.  She sees endo for diabetes and this has been a moving target. She has been taking ASA and max dose statin. She was prescribed clopidogrel by neuro but tells me this was not sent to her pharmacy, despite epic showing that it was.   She has patches and gum but these have failed in the past. Chantix does not work and made her feel ill.   She has physical therapy coming to the home. She is medication compliant.  [facility]   She is allergic to adhesive [tape]; sulfa antibiotics; decongestant [pseudoephedrine hcl er]; prednisone; percocet [oxycodone-acetaminophen]; zoloft [sertraline hcl]; and latex.   She  has a past medical history of Anemia, Anxiety, Complication of anesthesia (` 1998), Gallstones, GERD (gastroesophageal reflux disease), H/O hiatal hernia, History of stomach ulcers, Migraines, Sleep apnea, Stroke (HCC), and Type II diabetes mellitus (HCC).    She  reports that she has been smoking cigarettes.  She has a 29.00 pack-year smoking history. She has never used smokeless tobacco. She reports that she does not drink alcohol or use drugs. She  reports that she currently engages in sexual activity. The patient  has a past surgical history that includes Appendectomy (01/03/2012); Abdominal hysterectomy (08/1998); Dilation and curettage of uterus; Abdominal exploration surgery; Tubal ligation (05/1998); LEEP (1998); and laparoscopic appendectomy (01/03/2012).  Her family history includes Breast cancer in her unknown relative; Diabetes in her paternal grandfather.  Review of Systems  Constitutional: Negative for chills, diaphoresis and fever.  Eyes: Negative.   Respiratory: Negative for shortness of breath.    Cardiovascular: Negative for chest pain, orthopnea and leg swelling.  Gastrointestinal: Negative for abdominal pain and nausea.  Genitourinary: Negative for dysuria, flank pain, frequency, hematuria and urgency.  Skin: Negative for rash.  Neurological: Negative for dizziness, sensory change, speech change, focal weakness and headaches.    The problem list and medications were reviewed and updated by myself where necessary and exist elsewhere in the encounter.   OBJECTIVE:  BP 117/72   Pulse 83   Temp 97.8 F (36.6 C)   Resp 16   Ht 5\' 4"  (1.626 m)   Wt 190 lb (86.2 kg)   SpO2 95%   BMI 32.61 kg/m   Wt Readings from Last 3 Encounters:  01/01/18 190 lb (86.2 kg)  12/28/17 197 lb 15.6 oz (89.8 kg)  12/19/17 192 lb (87.1 kg)   Temp Readings from Last 3 Encounters:  01/01/18 97.8 F (36.6 C)  12/28/17 98.1 F (36.7 C) (Oral)  12/11/17 98 F (36.7 C)   BP Readings from Last 3 Encounters:  01/01/18 117/72  12/29/17 127/73  12/19/17 118/68   Pulse Readings from Last 3 Encounters:  01/01/18 83  12/29/17 70  12/19/17 74    Physical Exam  Constitutional: She is oriented to person, place, and time. She appears well-nourished. No distress.  Eyes: Pupils are equal, round, and reactive to light. EOM are normal.  Cardiovascular: Normal rate, regular rhythm, normal heart sounds and intact distal pulses.  Pulmonary/Chest: Effort normal. No stridor. No respiratory distress. She has no wheezes. She has no rales.  Abdominal: She exhibits no distension.  Neurological: She is alert and oriented  to person, place, and time. She displays normal reflexes. No cranial nerve deficit or sensory deficit. She exhibits abnormal muscle tone. Coordination abnormal. Gait normal.  New weakness in the right extremity.   Skin: Skin is dry. She is not diaphoretic.  Psychiatric: She has a normal mood and affect.  Vitals reviewed.   Lab Results  Component Value Date   HGBA1C 8.8 (H) 12/28/2017      Lab Results  Component Value Date   WBC 9.9 12/29/2017   HGB 14.4 12/29/2017   HCT 44.1 12/29/2017   MCV 88.2 12/29/2017   PLT 337 12/29/2017    Lab Results  Component Value Date   CREATININE 0.79 12/29/2017   BUN 7 12/29/2017   NA 137 12/29/2017   K 3.7 12/29/2017   CL 107 12/29/2017   CO2 22 12/29/2017    Lab Results  Component Value Date   ALT 17 12/27/2017   AST 23 12/27/2017   ALKPHOS 60 12/27/2017   BILITOT 1.2 12/27/2017    Lab Results  Component Value Date   TSH 3.14 06/12/2017    Lab Results  Component Value Date   CHOL 85 12/28/2017   HDL 30 (L) 12/28/2017   LDLCALC 18 12/28/2017   TRIG 185 (H) 12/28/2017   CHOLHDL 2.8 12/28/2017     ASSESSMENT AND PLAN:  Kristie Tapia was seen today for hospitalization follow-up.  Diagnoses and all orders for this visit:  Right arm weakness: Uncontrolled diabetic who smokes.  No history of HTN.  Taking max dose statin, ASA. I've prescribed nicotine gum and hopefully she can stop smoking. To my knowledge there are no other preventative therapies to protect her other than controlling her diabetes and smoking cessation.  -     clopidogrel (PLAVIX) 75 MG tablet; Take 1 tablet (75 mg total) by mouth daily. -     albuterol (PROVENTIL HFA;VENTOLIN HFA) 108 (90 Base) MCG/ACT inhaler; Inhale 2 puffs into the lungs every 6 (six) hours as needed for wheezing or shortness of breath. -     Renal Function Panel -     CBC -     nicotine polacrilex (NICORETTE) 4 MG gum; Take 1 each (4 mg total) by mouth as needed for smoking cessation.    The patient is advised to call or return to clinic if she does not see an improvement in symptoms, or to seek the care of the closest emergency department if she worsens with the above plan.   Deliah Boston, MHS, PA-C Primary Care at [facility] 01/07/2018 12:52 PM

## 2018-01-07 ENCOUNTER — Telehealth: Payer: Self-pay | Admitting: Nutrition

## 2018-01-07 NOTE — Telephone Encounter (Signed)
Patient says she got her first prescription for free, but says the insurance denied it.  She was given the rep.'s telephone number to call for help with this.

## 2018-01-08 NOTE — Telephone Encounter (Signed)
Discussed patient's problem with Thayer Ohmhris the V-go rep.  He says she is covered at 100 % but [facility] under [facility] benefit.  Paperwork [facility] and [facility], and patient notified that she will be getting a call from [facility] to set up her account, before supplies can be shipped.

## 2018-01-10 ENCOUNTER — Ambulatory Visit: Payer: BLUE CROSS/BLUE SHIELD | Admitting: Physician Assistant

## 2018-01-11 ENCOUNTER — Other Ambulatory Visit: Payer: Self-pay | Admitting: Physician Assistant

## 2018-01-11 DIAGNOSIS — F419 Anxiety disorder, unspecified: Secondary | ICD-10-CM

## 2018-01-13 ENCOUNTER — Encounter: Payer: Self-pay | Admitting: Physician Assistant

## 2018-01-13 ENCOUNTER — Other Ambulatory Visit: Payer: Self-pay

## 2018-01-13 ENCOUNTER — Ambulatory Visit (INDEPENDENT_AMBULATORY_CARE_PROVIDER_SITE_OTHER): Payer: BLUE CROSS/BLUE SHIELD | Admitting: Physician Assistant

## 2018-01-13 VITALS — BP 128/80 | HR 73 | Temp 97.8°F | Resp 16 | Ht 64.0 in | Wt 191.0 lb

## 2018-01-13 DIAGNOSIS — R112 Nausea with vomiting, unspecified: Secondary | ICD-10-CM | POA: Diagnosis not present

## 2018-01-13 DIAGNOSIS — E1165 Type 2 diabetes mellitus with hyperglycemia: Secondary | ICD-10-CM

## 2018-01-13 DIAGNOSIS — M7918 Myalgia, other site: Secondary | ICD-10-CM

## 2018-01-13 DIAGNOSIS — G8929 Other chronic pain: Secondary | ICD-10-CM

## 2018-01-13 LAB — GLUCOSE, POCT (MANUAL RESULT ENTRY): POC Glucose: 202 mg/dl — AB (ref 70–99)

## 2018-01-13 MED ORDER — GABAPENTIN 100 MG PO CAPS: 100.0000 mg | ORAL_CAPSULE | ORAL | 3 refills | Status: DC

## 2018-01-13 MED ORDER — METHOCARBAMOL 500 MG PO TABS: 250.0000 mg | ORAL_TABLET | Freq: Three times a day (TID) | ORAL | 1 refills | Status: DC

## 2018-01-13 NOTE — Patient Instructions (Addendum)
Take tylenol 1000 mg every eight hours as needed for back pain.     IF you received an x-ray today, you will receive an invoice from [facility]. Please contact [facility] at [phone] with questions or concerns regarding your invoice.   IF you received labwork today, you will receive an invoice from [facility]. Please contact [facility] at [phone] with questions or concerns regarding your invoice.   Our billing staff will not be able to assist you with questions regarding bills from these companies.  You will be contacted with the lab results as soon as they are available. The fastest way to get your results is to activate your My Chart account. Instructions are located on the last page of this paperwork. If you have not heard from us regarding the results in 2 weeks, please contact this office.

## 2018-01-13 NOTE — Telephone Encounter (Signed)
Lorazepam refill Last Refill:07/06/17 #20 with 2 refills Last OV: 01/01/18 for acute visit PCP: Daleen SnookMichael Clark,PA

## 2018-01-13 NOTE — Progress Notes (Signed)
01/13/2018 3:57 PM   DOB: 1971-05-14 / MRN: [id number]  SUBJECTIVE:  Kristie Tapia is a 47 y.o. female presenting for continued thoracic back pain that is worse with neck movement. Symptoms present chronically.  The problem waxes and wanes. She has tried muscle relaxers along with Aleve, both of which do not really help her.  She is not a good candidate for NSAIDs given history of stroke and multiple risk factors for cardiac disease.  Support candidate for prednisone given uncontrolled diabetes on insulin.  She is allergic to adhesive [tape]; sulfa antibiotics; decongestant [pseudoephedrine hcl er]; prednisone; percocet [oxycodone-acetaminophen]; zoloft [sertraline hcl]; and latex.   She  has a past medical history of Anemia, Anxiety, Complication of anesthesia (` 1998), Gallstones, GERD (gastroesophageal reflux disease), H/O hiatal hernia, History of stomach ulcers, Migraines, Sleep apnea, Stroke (HCC), and Type II diabetes mellitus (HCC).    She  reports that she has been smoking cigarettes. She has a 29.00 pack-year smoking history. She has never used smokeless tobacco. She reports that she does not drink alcohol or use drugs. She  reports that she currently engages in sexual activity. The patient  has a past surgical history that includes Appendectomy (01/03/2012); Abdominal hysterectomy (08/1998); Dilation and curettage of uterus; Abdominal exploration surgery; Tubal ligation (05/1998); LEEP (1998); and laparoscopic appendectomy (01/03/2012).  Her family history includes Breast cancer in her unknown relative; Diabetes in her paternal grandfather.  Review of Systems  Constitutional: Negative for chills, diaphoresis and fever.  Respiratory: Negative for cough, hemoptysis, sputum production, shortness of breath and wheezing.   Cardiovascular: Negative for chest pain, orthopnea and leg swelling.  Gastrointestinal: Negative for nausea.  Musculoskeletal: Positive for back pain, joint pain,  myalgias and neck pain. Negative for falls.  Skin: Negative for rash.  Neurological: Negative for dizziness.    The problem list and medications were reviewed and updated by myself where necessary and exist elsewhere in the encounter.   OBJECTIVE:  BP 128/80   Pulse 73   Temp 97.8 F (36.6 C)   Resp 16   Ht 5\' 4"  (1.626 m)   Wt 191 lb (86.6 kg)   SpO2 96%   BMI 32.79 kg/m   Wt Readings from Last 3 Encounters:  01/13/18 191 lb (86.6 kg)  01/01/18 190 lb (86.2 kg)  12/28/17 197 lb 15.6 oz (89.8 kg)   Temp Readings from Last 3 Encounters:  01/13/18 97.8 F (36.6 C)  01/01/18 97.8 F (36.6 C)  12/28/17 98.1 F (36.7 C) (Oral)   BP Readings from Last 3 Encounters:  01/13/18 128/80  01/01/18 117/72  12/29/17 127/73   Pulse Readings from Last 3 Encounters:  01/13/18 73  01/01/18 83  12/29/17 70    Physical Exam  Constitutional: She is oriented to person, place, and time. She appears well-nourished. No distress.  Eyes: Pupils are equal, round, and reactive to light. EOM are normal.  Cardiovascular: Normal rate, regular rhythm, S1 normal, S2 normal, normal heart sounds and intact distal pulses. Exam reveals no gallop, no friction rub and no decreased pulses.  No murmur heard. Pulmonary/Chest: Effort normal. No stridor. No respiratory distress. She has no wheezes. She has no rales.  Abdominal: She exhibits no distension.  Musculoskeletal: She exhibits no edema.       Thoracic back: She exhibits spasm. She exhibits normal range of motion, no bony tenderness, no swelling, no edema, no pain and normal pulse.  Neurological: She is alert and oriented to person, place, and time.  No cranial nerve deficit. Gait normal.  Skin: Skin is dry. No rash noted. She is not diaphoretic.  Psychiatric: She has a normal mood and affect.  Vitals reviewed.   Lab Results  Component Value Date   HGBA1C 8.8 (H) 12/28/2017    Lab Results  Component Value Date   WBC 9.9 12/29/2017   HGB  14.4 12/29/2017   HCT 44.1 12/29/2017   MCV 88.2 12/29/2017   PLT 337 12/29/2017    Lab Results  Component Value Date   CREATININE 0.79 12/29/2017   BUN 7 12/29/2017   NA 137 12/29/2017   K 3.7 12/29/2017   CL 107 12/29/2017   CO2 22 12/29/2017    Lab Results  Component Value Date   ALT 17 12/27/2017   AST 23 12/27/2017   ALKPHOS 60 12/27/2017   BILITOT 1.2 12/27/2017    Lab Results  Component Value Date   TSH 3.14 06/12/2017    Lab Results  Component Value Date   CHOL 85 12/28/2017   HDL 30 (L) 12/28/2017   LDLCALC 18 12/28/2017   TRIG 185 (H) 12/28/2017   CHOLHDL 2.8 12/28/2017     ASSESSMENT AND PLAN:  [name] was seen today for follow-up.  Diagnoses and all orders for this visit:  Uncontrolled type 2 diabetes mellitus with hyperglycemia (HCC) -     POCT glucose (manual entry) -     POCT urinalysis dipstick  Nausea and vomiting, intractability of vomiting not specified, unspecified vomiting type: No significant changes on EKG.  Likely medication related.   -     EKG 12-Lead  Chronic musculoskeletal pain: Options, up to orthopedic referral, disucssed with patient.  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 Prednisone not safe due to diabetes.  She does not like tramadol.  Will try a different muscle relaxer and push up her gabapentin.  -     methocarbamol (ROBAXIN) 500 MG tablet; Take 0.5-1 tablets (250-500 mg total) by mouth 3 (three) times daily. -     gabapentin (NEURONTIN) 100 MG capsule; Take 1-3 capsules (100-300 mg total) by mouth See admin instructions. Take 1 capsule every morning then take 2 capsules every evening    The patient is advised to call or return to clinic if she does not see an improvement in symptoms, or to seek the care of the closest emergency department if she worsens with the above plan.   Deliah BostonMichael Jakalyn Kratky, MHS, PA-C Primary Care at [name] 01/13/2018 3:57 PM

## 2018-01-27 ENCOUNTER — Other Ambulatory Visit: Payer: Self-pay | Admitting: Physician Assistant

## 2018-01-27 DIAGNOSIS — M47816 Spondylosis without myelopathy or radiculopathy, lumbar region: Secondary | ICD-10-CM

## 2018-02-04 ENCOUNTER — Encounter: Payer: Self-pay | Admitting: Endocrinology

## 2018-02-04 ENCOUNTER — Ambulatory Visit (INDEPENDENT_AMBULATORY_CARE_PROVIDER_SITE_OTHER): Payer: BLUE CROSS/BLUE SHIELD | Admitting: Endocrinology

## 2018-02-04 VITALS — BP 122/82 | HR 80 | Ht 64.0 in | Wt 194.8 lb

## 2018-02-04 DIAGNOSIS — Z794 Long term (current) use of insulin: Secondary | ICD-10-CM | POA: Diagnosis not present

## 2018-02-04 DIAGNOSIS — E1142 Type 2 diabetes mellitus with diabetic polyneuropathy: Secondary | ICD-10-CM

## 2018-02-04 NOTE — Patient Instructions (Addendum)
Please stop taking the glimepiride. Please continue the same V-Go, with same clicks.  Please come back for a follow-up appointment in 4-6 weeks.   check your blood sugar twice a day.  vary the time of day when you check, between before the 3 meals, and at bedtime.  also check if you have symptoms of your blood sugar being too high or too low.  please keep a record of the readings and bring it to your next appointment here (or you can bring the meter itself).  You can write it on any piece of paper.  please call us sooner if your blood sugar goes below 70, or if you have a lot of readings over 200.

## 2018-02-04 NOTE — Progress Notes (Signed)
Subjective:    Patient ID: Kristie Tapia, female    DOB: [birth year], 47 y.o.   MRN: [id number]  HPI Pt returns for f/u of diabetes mellitus: DM type: Insulin-requiring type 2.  Dx'ed: 8325 Complications: polyneuropathy and CVA.   Therapy: insulin since 2001, and V-GO-40 since 2019.   GDM: never DKA: never Severe hypoglycemia: never Pancreatitis: never Pancreatic imaging: normal on 2017 CT Other: she is not a candidate for multiple daily injections, due to missing insulin doses; she has seen [facility] for nausea.   Interval history: She takes 1-2 clicks per meal.  I reviewed continuous glucose monitor data.  Glucose varies from 65-200, but most are in the low-100's.  It is highest at lunch.  pt states she feels well in general.  She has mild hypoglycemia approx once per week.  This happens with exercise approx once per day.   Past Medical History:  Diagnosis Date  . Anemia   . Anxiety   . Complication of anesthesia ` 1998   "lungs collapsed during LEEP procedure"  . Gallstones   . GERD (gastroesophageal reflux disease)   . H/O hiatal hernia   . History of stomach ulcers   . Migraines   . Sleep apnea   . Stroke ([place])   . Type II diabetes mellitus ([place])     Past Surgical History:  Procedure Laterality Date  . ABDOMINAL EXPLORATION SURGERY     "I've had several"  . ABDOMINAL HYSTERECTOMY  08/1998  . APPENDECTOMY  01/03/2012  . DILATION AND CURETTAGE OF UTERUS    . LAPAROSCOPIC APPENDECTOMY  01/03/2012   Procedure: APPENDECTOMY LAPAROSCOPIC;  Surgeon: Edward Jolly, MD;  Location: [facility];  Service: General;  Laterality: N/A;  . LEEP  1998   "lungs collapsed"  . TUBAL LIGATION  05/1998    Social History   Socioeconomic History  . Marital status: Married    Spouse name: Not on file  . Number of children: Not on file  . Years of education: Not on file  . Highest education level: Not on file  Occupational History  . Not on file  Social Needs  . Financial resource  strain: Not on file  . Food insecurity:    Worry: Not on file    Inability: Not on file  . Transportation needs:    Medical: Not on file    Non-medical: Not on file  Tobacco Use  . Smoking status: Current Every Day Smoker    Packs/day: 1.00    Years: 29.00    Pack years: 29.00    Types: Cigarettes    Last attempt to quit: 03/05/2017    Years since quitting: 0.9  . Smokeless tobacco: Never Used  . Tobacco comment: has smoked since age 3  Substance and Sexual Activity  . Alcohol use: No    Comment: hasn't  had s drink in over 5 yrs  . Drug use: No  . Sexual activity: Yes  Lifestyle  . Physical activity:    Days per week: Not on file    Minutes per session: Not on file  . Stress: Not on file  Relationships  . Social connections:    Talks on phone: Not on file    Gets together: Not on file    Attends religious service: Not on file    Active member of club or organization: Not on file    Attends meetings of clubs or organizations: Not on file    Relationship status: Not  on file  . Intimate partner violence:    Fear of current or ex partner: Not on file    Emotionally abused: Not on file    Physically abused: Not on file    Forced sexual activity: Not on file  Other Topics Concern  . Not on file  Social History Narrative  . Not on file    Current Outpatient Medications on File Prior to Visit  Medication Sig Dispense Refill  . albuterol (PROVENTIL HFA;VENTOLIN HFA) 108 (90 Base) MCG/ACT inhaler Inhale 2 puffs into the lungs every 6 (six) hours as needed for wheezing or shortness of breath. 1 Inhaler 0  . clopidogrel (PLAVIX) 75 MG tablet Take 1 tablet (75 mg total) by mouth daily. 30 tablet 2  . Continuous Blood Gluc Sensor (FREESTYLE LIBRE 14 DAY SENSOR) MISC 1 each by Does not apply route every 14 (fourteen) days. 2 each 2  . escitalopram (LEXAPRO) 20 MG tablet Take 1 tablet (20 mg total) by mouth daily. 90 tablet 3  . fluticasone (FLONASE) 50 MCG/ACT nasal spray Place  into both nostrils as needed for allergies or rhinitis.    Marland Kitchen gabapentin (NEURONTIN) 100 MG capsule Take 1-3 capsules (100-300 mg total) by mouth See admin instructions. Take 1 capsule every morning then take 2 capsules every evening 180 capsule 3  . Insulin Disposable Pump (V-GO 40) KIT 1 Device by Does not apply route daily. 30 kit 11  . LORazepam (ATIVAN) 1 MG tablet TAKE 1 TABLET BY MOUTH TWICE DAILY AS NEEDED FOR ANXIETY ( FOR ACUTE PANIC ONLY. NO MORE THAN 60 TABS IN ONE YEAR FROM 07/06/17) 20 tablet 2  . methocarbamol (ROBAXIN) 500 MG tablet Take 0.5-1 tablets (250-500 mg total) by mouth 3 (three) times daily. 60 tablet 1  . metoCLOPramide (REGLAN) 10 MG tablet Take 1 tablet (10 mg total) by mouth every 6 (six) hours as needed for nausea. 25 tablet 2  . nicotine polacrilex (NICORETTE) 4 MG gum Take 1 each (4 mg total) by mouth as needed for smoking cessation. 100 tablet 1  . nitroGLYCERIN (NITROSTAT) 0.4 MG SL tablet Place 0.4 mg under the tongue every 5 (five) minutes as needed for chest pain.    . pantoprazole (PROTONIX) 40 MG tablet Take 40 mg by mouth 2 (two) times daily.   1  . promethazine (PHENERGAN) 25 MG tablet Take 25 mg by mouth every 6 (six) hours as needed for nausea.   0  . rosuvastatin (CRESTOR) 40 MG tablet Take 1 tablet (40 mg total) by mouth daily. 90 tablet 3  . topiramate ER (QUDEXY XR) CS24 sprinkle capsule Take 1 capsule (100 mg total) by mouth at bedtime. 30 each 3  . traZODone (DESYREL) 100 MG tablet Take 1-1.5 tablets (100-150 mg total) by mouth at bedtime. (Patient taking differently: Take 150 mg by mouth at bedtime. ) 30 tablet 11   No current facility-administered medications on file prior to visit.     Allergies  Allergen Reactions  . Adhesive [Tape] Other (See Comments)    Blisters USE PAPER TAPE ONLY  . Sulfa Antibiotics Other (See Comments)    Burns the inside of my mouth; "blisters; leaves tongue and inside of mouth solid red"  . Decongestant  [Pseudoephedrine Hcl Er] Itching    ALL DECONGESTANTS  . Other Itching    ALL DECONGESTANTS  . Prednisone Other (See Comments)    Gives me a bad attitude  . Percocet [Oxycodone-Acetaminophen] Other (See Comments)    PT reports  having nightmares when taking Percocet  . Zoloft [Sertraline Hcl]   . Latex Itching    Family History  Problem Relation Age of Onset  . Breast cancer Unknown   . Diabetes Paternal Grandfather     BP 122/82   Pulse 80   Ht _0  (1.626 m)   Wt 194 lb 12.8 oz (88.4 kg)   SpO2 95%   BMI 33.44 kg/m   Review of Systems She denies LOC    Objective:   Physical Exam VITAL SIGNS:  See vs page GENERAL: no distress Pulses: foot pulses are intact bilaterally.   MSK: no deformity of the feet or ankles.  CV: no edema of the legs or ankles Skin:  no ulcer on the feet or ankles.  normal color and temp on the feet and ankles Neuro: sensation is intact to touch on the feet and ankles.     Lab Results  Component Value Date   HGBA1C 8.8 (H) 12/28/2017      Assessment & Plan:  Insulin-requiring type 2 DM, with CVA.  Overcontrolled.   Weight gain: worse.  We discussed. We'll choose the amaryl for discontinuation.    Patient Instructions  Please stop taking the glimepiride. Please continue the same V-Go, with same clicks.  Please come back for a follow-up appointment in 4-6 weeks.   check your blood sugar twice a day.  vary the time of day when you check, between before the 3 meals, and at bedtime.  also check if you have symptoms of your blood sugar being too high or too low.  please keep a record of the readings and bring it to your next appointment here (or you can bring the meter itself).  You can write it on any piece of paper.  please call us sooner if your blood sugar goes below 70, or if you have a lot of readings over 200.

## 2018-02-19 ENCOUNTER — Ambulatory Visit: Payer: BLUE CROSS/BLUE SHIELD | Admitting: Physician Assistant

## 2018-02-24 ENCOUNTER — Other Ambulatory Visit: Payer: Self-pay | Admitting: Physician Assistant

## 2018-02-24 ENCOUNTER — Other Ambulatory Visit: Payer: Self-pay | Admitting: *Deleted

## 2018-02-24 DIAGNOSIS — R29898 Other symptoms and signs involving the musculoskeletal system: Secondary | ICD-10-CM

## 2018-02-24 MED ORDER — ALBUTEROL SULFATE HFA 108 (90 BASE) MCG/ACT IN AERS: 2.0000 | INHALATION_SPRAY | Freq: Four times a day (QID) | RESPIRATORY_TRACT | 0 refills | Status: DC | PRN

## 2018-02-26 ENCOUNTER — Other Ambulatory Visit: Payer: Self-pay

## 2018-02-26 ENCOUNTER — Encounter: Payer: Self-pay | Admitting: Physician Assistant

## 2018-02-26 ENCOUNTER — Ambulatory Visit (INDEPENDENT_AMBULATORY_CARE_PROVIDER_SITE_OTHER): Payer: BLUE CROSS/BLUE SHIELD | Admitting: Physician Assistant

## 2018-02-26 VITALS — BP 137/79 | HR 63 | Temp 97.8°F | Resp 18 | Ht 64.0 in | Wt 192.0 lb

## 2018-02-26 DIAGNOSIS — R05 Cough: Secondary | ICD-10-CM

## 2018-02-26 DIAGNOSIS — M546 Pain in thoracic spine: Secondary | ICD-10-CM | POA: Diagnosis not present

## 2018-02-26 DIAGNOSIS — G8929 Other chronic pain: Secondary | ICD-10-CM | POA: Diagnosis not present

## 2018-02-26 DIAGNOSIS — R059 Cough, unspecified: Secondary | ICD-10-CM

## 2018-02-26 MED ORDER — ALBUTEROL SULFATE HFA 108 (90 BASE) MCG/ACT IN AERS: 2.0000 | INHALATION_SPRAY | Freq: Four times a day (QID) | RESPIRATORY_TRACT | 4 refills | Status: DC | PRN

## 2018-02-26 NOTE — Progress Notes (Signed)
Kristie Tapia  MRN: [id number] DOB: 06-20-1970  PCP: Patient, No Pcp Per  Subjective:  Pt is a 47 year old female who presents to clinic for medication refills. Former PCP Philis Fendt. Last OV 01/13/2018  Albuterol - uses as needed. Uses this every day for the past few weeks since her allergies have been acting up.  flonase is not helping allergies.  Singulair does not work.  She cannot use neti pot. Has not tried saline nasal spray.   Chronic left shoulder pain. H/o fall last year from her dog pulling her out of her chair. Pain radiates down left side of back. Pain is intermittent. Complicated by fibromyalgia.   Pt  has a past medical history of Anemia, Anxiety, Complication of anesthesia (` 1998), Gallstones, GERD (gastroesophageal reflux disease), H/O hiatal hernia, History of stomach ulcers, Migraines, Sleep apnea, Stroke ([place]), and Type II diabetes mellitus ([place]).  Pt  reports that she has been smoking cigarettes. She has a 29.00 pack-year smoking history. She has never used smokeless tobacco. She reports that she does not drink alcohol or use drugs.  Review of Systems  Constitutional: Negative for chills, diaphoresis, fatigue and fever.  HENT: Positive for congestion, postnasal drip, rhinorrhea and sneezing. Negative for sinus pressure, sinus pain and sore throat.   Respiratory: Positive for cough, shortness of breath and wheezing.   Allergic/Immunologic: Positive for environmental allergies.    Patient Active Problem List   Diagnosis Date Noted  . CVA (cerebral vascular accident) ([place]) 12/27/2017  . Tobacco abuse 12/27/2017  . Obesity 12/27/2017  . Ischemic stroke ([place]) 12/27/2017  . Diabetic peripheral neuropathy ([place]) 12/27/2017  . Smoker unmotivated to quit 07/07/2017  . Chronic GERD 07/07/2017  . Chronic anxiety 07/07/2017  . Dyslipidemia 02/24/2017  . Diabetes ([place]) 02/24/2017  . Headache 02/24/2017    Current Outpatient Medications on File Prior to Visit    Medication Sig Dispense Refill  . albuterol (PROVENTIL HFA;VENTOLIN HFA) 108 (90 Base) MCG/ACT inhaler Inhale 2 puffs into the lungs every 6 (six) hours as needed for wheezing or shortness of breath. 1 Inhaler 0  . clopidogrel (PLAVIX) 75 MG tablet Take 1 tablet (75 mg total) by mouth daily. 30 tablet 2  . Continuous Blood Gluc Sensor (FREESTYLE LIBRE 14 DAY SENSOR) MISC 1 each by Does not apply route every 14 (fourteen) days. 2 each 2  . escitalopram (LEXAPRO) 20 MG tablet Take 1 tablet (20 mg total) by mouth daily. 90 tablet 3  . fluticasone (FLONASE) 50 MCG/ACT nasal spray Place into both nostrils as needed for allergies or rhinitis.    Marland Kitchen gabapentin (NEURONTIN) 100 MG capsule Take 1-3 capsules (100-300 mg total) by mouth See admin instructions. Take 1 capsule every morning then take 2 capsules every evening 180 capsule 3  . Insulin Disposable Pump (V-GO 40) KIT 1 Device by Does not apply route daily. 30 kit 11  . LORazepam (ATIVAN) 1 MG tablet TAKE 1 TABLET BY MOUTH TWICE DAILY AS NEEDED FOR ANXIETY ( FOR ACUTE PANIC ONLY. NO MORE THAN 60 TABS IN ONE YEAR FROM 07/06/17) 20 tablet 2  . methocarbamol (ROBAXIN) 500 MG tablet Take 0.5-1 tablets (250-500 mg total) by mouth 3 (three) times daily. 60 tablet 1  . metoCLOPramide (REGLAN) 10 MG tablet Take 1 tablet (10 mg total) by mouth every 6 (six) hours as needed for nausea. 25 tablet 2  . nicotine polacrilex (NICORETTE) 4 MG gum Take 1 each (4 mg total) by mouth as needed for smoking  cessation. 100 tablet 1  . nitroGLYCERIN (NITROSTAT) 0.4 MG SL tablet Place 0.4 mg under the tongue every 5 (five) minutes as needed for chest pain.    . pantoprazole (PROTONIX) 40 MG tablet Take 40 mg by mouth 2 (two) times daily.   1  . promethazine (PHENERGAN) 25 MG tablet Take 25 mg by mouth every 6 (six) hours as needed for nausea.   0  . rosuvastatin (CRESTOR) 40 MG tablet Take 1 tablet (40 mg total) by mouth daily. 90 tablet 3  . topiramate ER (QUDEXY XR) CS24  sprinkle capsule Take 1 capsule (100 mg total) by mouth at bedtime. 30 each 3  . traZODone (DESYREL) 100 MG tablet Take 1-1.5 tablets (100-150 mg total) by mouth at bedtime. (Patient taking differently: Take 150 mg by mouth at bedtime. ) 30 tablet 11   No current facility-administered medications on file prior to visit.     Allergies  Allergen Reactions  . Adhesive [Tape] Other (See Comments)    Blisters USE PAPER TAPE ONLY  . Sulfa Antibiotics Other (See Comments)    Burns the inside of my mouth; "blisters; leaves tongue and inside of mouth solid red"  . Decongestant [Pseudoephedrine Hcl Er] Itching    ALL DECONGESTANTS  . Other Itching    ALL DECONGESTANTS  . Prednisone Other (See Comments)    Gives me a bad attitude  . Percocet [Oxycodone-Acetaminophen] Other (See Comments)    PT reports having nightmares when taking Percocet  . Zoloft [Sertraline Hcl]   . Latex Itching     Objective:  BP 137/79   Pulse 63   Temp 97.8 F (36.6 C) (Oral)   Resp 18   Ht 5' 4"  (1.626 m)   Wt 192 lb (87.1 kg)   SpO2 97%   BMI 32.96 kg/m   Physical Exam  Constitutional: She is oriented to person, place, and time. No distress.  HENT:  Right Ear: Tympanic membrane normal.  Left Ear: Tympanic membrane normal.  Nose: Mucosal edema present. No rhinorrhea. Right sinus exhibits no maxillary sinus tenderness and no frontal sinus tenderness. Left sinus exhibits no maxillary sinus tenderness and no frontal sinus tenderness.  Mouth/Throat: Oropharynx is clear and moist and mucous membranes are normal.  Cardiovascular: Normal rate, regular rhythm and normal heart sounds.  Pulmonary/Chest: Effort normal and breath sounds normal. No respiratory distress. She has no wheezes. She has no rales.  Neurological: She is alert and oriented to person, place, and time.  Skin: Skin is warm and dry.  Psychiatric: Judgment normal.  Vitals reviewed.   Assessment and Plan :  1. Cough - Advised better seasonal  allergy control. Discussed proper dosage of Albuterol. RTC PRN.  - albuterol (PROVENTIL HFA;VENTOLIN HFA) 108 (90 Base) MCG/ACT inhaler; Inhale 2 puffs into the lungs every 6 (six) hours as needed for wheezing or shortness of breath.  Dispense: 1 Inhaler; Refill: 4  2. Chronic left-sided thoracic back pain - Advised heat, massage, hydration. RTC for dry needling if needed.    Mercer Pod, PA-C  [facility] 02/26/2018 10:59 AM  Please note: Portions of this report may have been transcribed using dragon voice recognition software. Every effort was made to ensure accuracy; however, inadvertent computerized transcription errors may be present.

## 2018-02-26 NOTE — Patient Instructions (Addendum)
Try nasal saline spray for your nasal congestion.  Stay well hydrated - drink at least 1-2 liters water/daily.   For your shoulder/back: Apply moist heat to the area. Wet a towel and wring it out so it is damp. Put it in the microwave for about 15-20 seconds - long enough to make it hot, but not too hot to apply to your skin causing burns. Do this for about 20-30 minutes, 3-4 times a day.   Perform gentle, light stretches 2-3 times a day.   Put a tennis ball between your back and a wall. Gentle massage the area with rolling the ball around the affected area. Try a foam roller.   Stay well hydrated - try to drink 32-64 oz/day.   If you feel like you need a new pillow, try "My Pillow".  Come back and see me if you would like to try a dry needling session. (see below).    Trigger Point Dry Needling   What is Trigger Point Dry Needling (DN)?   1. DN is a physical therapy technique used to treat muscle pain and Dysfunction.  Specifically, DN helps deactivate muscle trigger points (Muscle Knots).   2. A thin filiform needle is used to penetrate the skin and stimulate the underlying trigger point.  The goal is for a local twitch response (LTR) to occur and for the trigger point to relax.  No medication of any kind is injected during the procedure.   What Does Trigger Point Dry Needling Feel Like?   1. The procedures feels different for each individual patient.   Some patients report that they do not actually feel the needle enter the skin and overall the process is not  painful.  Very  mild bleeding may occur.  However, many patients feel a deep cramping in the muscle in which the needle was inserted. This is the local twitch response.    How Will I Feel After The Treatment?   1. Soreness is normal, and the onset of soreness may not occur for a few hours.  Typically this soreness does not last longer than two days.   2. Bruising is uncommon, however; ice can be used to decrease any possible  bruising.   3. In rare cases feeling tired or nauseous after the treatment is normal.  In addition, your symptoms may get worse before they get better, this period will typically not last longer than 24 hours.   What Can I do After My Treatment?   1.  Increase your hydration by drinking more water for the next 24 hours.   2.  You may place ice or heat on the areas treated that have become sore, however don not use heat on inflamed or bruised areas.  Heat often brings more relief post needling.   3. You can continue your regular activities, but vigorous activity is not recommended initially after the treatment for 24 hours.   4. DN is best combined with other physical therapy such as strengthening, stretching, and other therapies.    Thank you for coming in today. I hope you feel we met your needs.  Feel free to call PCP if you have any questions or further requests.  Please consider signing up for MyChart if you do not already have it, as this is a great way to communicate with me.  Best,  [employer], PA-C

## 2018-02-28 ENCOUNTER — Telehealth: Payer: Self-pay | Admitting: Physician Assistant

## 2018-02-28 NOTE — Telephone Encounter (Signed)
Called pt to let her know that she is scheduled for an appt per her request on 03/08/18 at 12:20 with [facility].  I did ask her to call the office and let us know what she is needing to be seen for.  Thank you!

## 2018-03-08 ENCOUNTER — Other Ambulatory Visit: Payer: Self-pay

## 2018-03-08 ENCOUNTER — Ambulatory Visit (INDEPENDENT_AMBULATORY_CARE_PROVIDER_SITE_OTHER): Payer: BLUE CROSS/BLUE SHIELD | Admitting: Family Medicine

## 2018-03-08 ENCOUNTER — Encounter: Payer: Self-pay | Admitting: Family Medicine

## 2018-03-08 VITALS — BP 126/78 | HR 71 | Temp 97.8°F | Resp 18 | Ht 65.55 in | Wt 189.2 lb

## 2018-03-08 DIAGNOSIS — J0110 Acute frontal sinusitis, unspecified: Secondary | ICD-10-CM | POA: Diagnosis not present

## 2018-03-08 DIAGNOSIS — J302 Other seasonal allergic rhinitis: Secondary | ICD-10-CM

## 2018-03-08 MED ORDER — AMOXICILLIN-POT CLAVULANATE 875-125 MG PO TABS: 1.0000 | ORAL_TABLET | Freq: Two times a day (BID) | ORAL | 0 refills | Status: DC

## 2018-03-08 MED ORDER — MONTELUKAST SODIUM 10 MG PO TABS: 10.0000 mg | ORAL_TABLET | Freq: Every day | ORAL | 3 refills | Status: DC

## 2018-03-08 NOTE — Progress Notes (Signed)
10/5/201912:36 PM  Kristie Tapia [birth year], 47 y.o. female [id number]  Chief Complaint  Patient presents with  . Shoulder Pain    X 2 weeks- left shoulder     HPI:   Patient is a 47 y.o. female with past medical history significant for DM2, chronic left shoulder pain, GERD, HLP, CVA, migraine who presents today for cough  Having cough for past several weeks Sometimes productive sometimes dry Has been having constant PND and sinus headaches Worse when she needs to care for the birds Has been doing flonase, oral antihistamine, tessalon pearls without much benefit Using afrin every night, she is aware of rebound concerns  Fall Risk  03/08/2018 02/26/2018 01/13/2018 01/01/2018 12/19/2017  Falls in the past year? No Yes No No No  Comment - - - - -  Number falls in past yr: - 2 or more - - -  Injury with Fall? - No - - -  Follow up - - - - -     Depression screen [facility] 2/9 03/08/2018 02/26/2018 01/13/2018  Decreased Interest 0 0 0  Down, Depressed, Hopeless 0 0 0  PHQ - 2 Score 0 0 0    Allergies  Allergen Reactions  . Adhesive [Tape] Other (See Comments)    Blisters USE PAPER TAPE ONLY  . Sulfa Antibiotics Other (See Comments)    Burns the inside of my mouth; "blisters; leaves tongue and inside of mouth solid red"  . Decongestant [Pseudoephedrine Hcl Er] Itching    ALL DECONGESTANTS  . Other Itching    ALL DECONGESTANTS  . Prednisone Other (See Comments)    Gives me a bad attitude  . Percocet [Oxycodone-Acetaminophen] Other (See Comments)    PT reports having nightmares when taking Percocet  . Zoloft [Sertraline Hcl]   . Latex Itching    Prior to Admission medications   Medication Sig Start Date End Date Taking? Authorizing Provider  albuterol (PROVENTIL HFA;VENTOLIN HFA) 108 (90 Base) MCG/ACT inhaler Inhale 2 puffs into the lungs every 6 (six) hours as needed for wheezing or shortness of breath. 02/26/18  Yes McVey, Gelene Mink, PA-C  clopidogrel (PLAVIX) 75 MG  tablet Take 1 tablet (75 mg total) by mouth daily. 01/01/18  Yes Tereasa Coop, PA-C  Continuous Blood Gluc Sensor (FREESTYLE LIBRE 14 DAY SENSOR) MISC 1 each by Does not apply route every 14 (fourteen) days. 11/21/17  Yes Philemon Kingdom, MD  escitalopram (LEXAPRO) 20 MG tablet Take 1 tablet (20 mg total) by mouth daily. 07/06/17  Yes Tereasa Coop, PA-C  fluticasone (FLONASE) 50 MCG/ACT nasal spray Place into both nostrils as needed for allergies or rhinitis.   Yes [provider]  gabapentin (NEURONTIN) 100 MG capsule Take 1-3 capsules (100-300 mg total) by mouth See admin instructions. Take 1 capsule every morning then take 2 capsules every evening 01/13/18  Yes Tereasa Coop, PA-C  Insulin Disposable Pump (V-GO 40) KIT 1 Device by Does not apply route daily. 11/14/17  Yes Renato Shin, MD  LORazepam (ATIVAN) 1 MG tablet TAKE 1 TABLET BY MOUTH TWICE DAILY AS NEEDED FOR ANXIETY ( FOR ACUTE PANIC ONLY. NO MORE THAN 60 TABS IN ONE YEAR FROM 07/06/17) 01/14/18  Yes Tereasa Coop, PA-C  methocarbamol (ROBAXIN) 500 MG tablet Take 0.5-1 tablets (250-500 mg total) by mouth 3 (three) times daily. 01/13/18  Yes Tereasa Coop, PA-C  metoCLOPramide (REGLAN) 10 MG tablet Take 1 tablet (10 mg total) by mouth every 6 (six) hours as needed for  nausea. 06/12/17  Yes Jaffe, Adam R, DO  nicotine polacrilex (NICORETTE) 4 MG gum Take 1 each (4 mg total) by mouth as needed for smoking cessation. 01/01/18  Yes Tereasa Coop, PA-C  nitroGLYCERIN (NITROSTAT) 0.4 MG SL tablet Place 0.4 mg under the tongue every 5 (five) minutes as needed for chest pain.   Yes [provider]  pantoprazole (PROTONIX) 40 MG tablet Take 40 mg by mouth 2 (two) times daily.  08/12/17  Yes [provider]  promethazine (PHENERGAN) 25 MG tablet Take 25 mg by mouth every 6 (six) hours as needed for nausea.  08/05/17  Yes [provider]  rosuvastatin (CRESTOR) 40 MG tablet Take 1 tablet (40 mg total) by mouth  daily. 07/07/17  Yes Tereasa Coop, PA-C  topiramate ER (QUDEXY XR) CS24 sprinkle capsule Take 1 capsule (100 mg total) by mouth at bedtime. 12/19/17  Yes Jaffe, Adam R, DO  traZODone (DESYREL) 100 MG tablet Take 1-1.5 tablets (100-150 mg total) by mouth at bedtime. Patient taking differently: Take 150 mg by mouth at bedtime.  12/11/17  Yes Hillis Range    Past Medical History:  Diagnosis Date  . Anemia   . Anxiety   . Complication of anesthesia ` 1998   "lungs collapsed during LEEP procedure"  . Gallstones   . GERD (gastroesophageal reflux disease)   . H/O hiatal hernia   . History of stomach ulcers   . Migraines   . Sleep apnea   . Stroke ([place])   . Type II diabetes mellitus ([place])     Past Surgical History:  Procedure Laterality Date  . ABDOMINAL EXPLORATION SURGERY     "I've had several"  . ABDOMINAL HYSTERECTOMY  08/1998  . APPENDECTOMY  01/03/2012  . DILATION AND CURETTAGE OF UTERUS    . LAPAROSCOPIC APPENDECTOMY  01/03/2012   Procedure: APPENDECTOMY LAPAROSCOPIC;  Surgeon: Edward Jolly, MD;  Location: [facility];  Service: General;  Laterality: N/A;  . LEEP  1998   "lungs collapsed"  . TUBAL LIGATION  05/1998    Social History   Tobacco Use  . Smoking status: Current Every Day Smoker    Packs/day: 1.00    Years: 29.00    Pack years: 29.00    Types: Cigarettes    Last attempt to quit: 03/05/2017    Years since quitting: 1.0  . Smokeless tobacco: Never Used  . Tobacco comment: has smoked since age 65  Substance Use Topics  . Alcohol use: No    Comment: hasn't  had s drink in over 5 yrs    Family History  Problem Relation Age of Onset  . Breast cancer Unknown   . Diabetes Paternal Grandfather     ROS Per hpi  OBJECTIVE:  Blood pressure 126/78, pulse 71, temperature 97.8 F (36.6 C), temperature source Oral, resp. rate 18, height 5' 5.55" (1.665 m), weight 189 lb 3.2 oz (85.8 kg), SpO2 95 %. Body mass index is 30.96 kg/m.   Physical Exam    Constitutional: She is oriented to person, place, and time. She appears well-developed and well-nourished.  HENT:  Head: Normocephalic and atraumatic.  Right Ear: Hearing, tympanic membrane, external ear and ear canal normal.  Left Ear: Hearing, tympanic membrane, external ear and ear canal normal.  Nose: Mucosal edema and rhinorrhea present. Right sinus exhibits no maxillary sinus tenderness and no frontal sinus tenderness. Left sinus exhibits maxillary sinus tenderness and frontal sinus tenderness.  Mouth/Throat: Oropharynx is clear and  moist.  Eyes: Pupils are equal, round, and reactive to light. Conjunctivae and EOM are normal.  Neck: Neck supple.  Cardiovascular: Normal rate, regular rhythm and normal heart sounds. Exam reveals no gallop and no friction rub.  No murmur heard. Pulmonary/Chest: Effort normal and breath sounds normal. She has no wheezes. She has no rales.  Musculoskeletal: She exhibits no edema.  Lymphadenopathy:    She has no cervical adenopathy.  Neurological: She is alert and oriented to person, place, and time.  Skin: Skin is warm and dry.  Psychiatric: She has a normal mood and affect.  Nursing note and vitals reviewed.    ASSESSMENT and PLAN  1. Acute non-recurrent frontal sinusitis 2. Seasonal allergies Discussed supportive measures, new meds r/se/b and RTC precautions. Patient educational handout given.  Other orders - amoxicillin-clavulanate (AUGMENTIN) 875-125 MG tablet; Take 1 tablet by mouth 2 (two) times daily. - montelukast (SINGULAIR) 10 MG tablet; Take 1 tablet (10 mg total) by mouth at bedtime.  Return for McVey left shoulder pain.    Rutherford Guys, MD Primary Care at [facility] [place], [place] [address] Ph.  [phone] Fax [phone]

## 2018-03-08 NOTE — Patient Instructions (Addendum)
If you have lab work done today you will be contacted with your lab results within the next 2 weeks.  If you have not heard from [place] then please contact us. The fastest way to get your results is to register for My Chart.   IF you received an x-ray today, you will receive an invoice from [facility]. Please contact [facility] at [phone] with questions or concerns regarding your invoice.   IF you received labwork today, you will receive an invoice from [place]. Please contact [facility] at [phone] with questions or concerns regarding your invoice.   Our billing staff will not be able to assist you with questions regarding bills from these companies.  You will be contacted with the lab results as soon as they are available. The fastest way to get your results is to activate your My Chart account. Instructions are located on the last page of this paperwork. If you have not heard from [place] regarding the results in 2 weeks, please contact this office.    Sinusitis  Sinusitis, Adult Sinusitis is soreness and inflammation of your sinuses. Sinuses are hollow spaces in the bones around your face. They are located:  Around your eyes.  In the middle of your forehead.  Behind your nose.  In your cheekbones.  Your sinuses and nasal passages are lined with a stringy fluid (mucus). Mucus normally drains out of your sinuses. When your nasal tissues get inflamed or swollen, the mucus can get trapped or blocked so air cannot flow through your sinuses. This lets bacteria, viruses, and funguses grow, and that leads to infection. Follow these instructions at home: Medicines  Take, use, or apply over-the-counter and prescription medicines only as told by your doctor. These may include nasal sprays.  If you were prescribed an antibiotic medicine, take it as told by your doctor. Do not stop taking the antibiotic even if you start to feel better. Hydrate and Humidify  Drink  enough water to keep your pee (urine) clear or pale yellow.  Use a cool mist humidifier to keep the humidity level in your home above 50%.  Breathe in steam for 10-15 minutes, 3-4 times a day or as told by your doctor. You can do this in the bathroom while a hot shower is running.  Try not to spend time in cool or dry air. Rest  Rest as much as possible.  Sleep with your head raised (elevated).  Make sure to get enough sleep each night. General instructions  Put a warm, moist washcloth on your face 3-4 times a day or as told by your doctor. This will help with discomfort.  Wash your hands often with soap and water. If there is no soap and water, use hand sanitizer.  Do not smoke. Avoid being around people who are smoking (secondhand smoke).  Keep all follow-up visits as told by your doctor. This is important. Contact a doctor if:  You have a fever.  Your symptoms get worse.  Your symptoms do not get better within 10 days. Get help right away if:  You have a very bad headache.  You cannot stop throwing up (vomiting).  You have pain or swelling around your face or eyes.  You have trouble seeing.  You feel confused.  Your neck is stiff.  You have trouble breathing. This information is not intended to replace advice given to you by your health care provider. Make sure you discuss any questions you have with your  health care provider. Document Released: 11/07/2007 Document Revised: 01/15/2016 Document Reviewed: 03/16/2015 Elsevier Interactive Patient Education  [employer].

## 2018-03-11 ENCOUNTER — Ambulatory Visit (INDEPENDENT_AMBULATORY_CARE_PROVIDER_SITE_OTHER): Payer: BLUE CROSS/BLUE SHIELD | Admitting: Endocrinology

## 2018-03-11 ENCOUNTER — Encounter: Payer: Self-pay | Admitting: Endocrinology

## 2018-03-11 VITALS — BP 136/82 | HR 61 | Ht 65.5 in | Wt 191.4 lb

## 2018-03-11 DIAGNOSIS — E1142 Type 2 diabetes mellitus with diabetic polyneuropathy: Secondary | ICD-10-CM | POA: Diagnosis not present

## 2018-03-11 DIAGNOSIS — Z794 Long term (current) use of insulin: Secondary | ICD-10-CM | POA: Diagnosis not present

## 2018-03-11 LAB — POCT GLYCOSYLATED HEMOGLOBIN (HGB A1C): Hemoglobin A1C: 6.5 % — AB (ref 4.0–5.6)

## 2018-03-11 MED ORDER — INSULIN REGULAR HUMAN 100 UNIT/ML IJ SOLN: INTRAMUSCULAR | 11 refills | Status: DC

## 2018-03-11 NOTE — Progress Notes (Signed)
Subjective:    Patient ID: Kristie Tapia, female    DOB: June 10, 1970, 47 y.o.   MRN: [id number]  HPI Pt returns for f/u of diabetes mellitus: DM type: Insulin-requiring type 2.  Dx'ed: 1941 Complications: polyneuropathy and CVA.   Therapy: insulin since 2001, and V-GO-40 since 2019.   GDM: never DKA: never Severe hypoglycemia: never Pancreatitis: never Pancreatic imaging: normal on 2017 CT Other: she is not a candidate for multiple daily injections, due to missing insulin doses; she has seen [facility] for nausea.   Interval history: She takes 0-2 clicks per meal (total 5 clicks per day).  I reviewed continuous glucose monitor data.  Glucose varies from 65-200, but most are in the low-100's.  It is again highest at lunch.  pt states she feels well in general.  She has mild hypoglycemia approx once per week.  This happens with exercise approx once per day.   Past Medical History:  Diagnosis Date  . Anemia   . Anxiety   . Complication of anesthesia ` 1998   "lungs collapsed during LEEP procedure"  . Gallstones   . GERD (gastroesophageal reflux disease)   . H/O hiatal hernia   . History of stomach ulcers   . Migraines   . Sleep apnea   . Stroke ([place])   . Type II diabetes mellitus ([place])     Past Surgical History:  Procedure Laterality Date  . ABDOMINAL EXPLORATION SURGERY     "I've had several"  . ABDOMINAL HYSTERECTOMY  08/1998  . APPENDECTOMY  01/03/2012  . DILATION AND CURETTAGE OF UTERUS    . LAPAROSCOPIC APPENDECTOMY  01/03/2012   Procedure: APPENDECTOMY LAPAROSCOPIC;  Surgeon: Edward Jolly, MD;  Location: [facility];  Service: General;  Laterality: N/A;  . LEEP  1998   "lungs collapsed"  . TUBAL LIGATION  05/1998    Social History   Socioeconomic History  . Marital status: Married    Spouse name: Not on file  . Number of children: 3  . Years of education: Not on file  . Highest education level: Not on file  Occupational History  . Not on file  Social Needs   . Financial resource strain: Not on file  . Food insecurity:    Worry: Not on file    Inability: Not on file  . Transportation needs:    Medical: Not on file    Non-medical: Not on file  Tobacco Use  . Smoking status: Current Every Day Smoker    Packs/day: 1.00    Years: 29.00    Pack years: 29.00    Types: Cigarettes    Last attempt to quit: 03/05/2017    Years since quitting: 1.0  . Smokeless tobacco: Never Used  . Tobacco comment: has smoked since age 6  Substance and Sexual Activity  . Alcohol use: No    Comment: hasn't  had s drink in over 5 yrs  . Drug use: No  . Sexual activity: Yes  Lifestyle  . Physical activity:    Days per week: Not on file    Minutes per session: Not on file  . Stress: Not on file  Relationships  . Social connections:    Talks on phone: Not on file    Gets together: Not on file    Attends religious service: Not on file    Active member of club or organization: Not on file    Attends meetings of clubs or organizations: Not on file  Relationship status: Not on file  . Intimate partner violence:    Fear of current or ex partner: Not on file    Emotionally abused: Not on file    Physically abused: Not on file    Forced sexual activity: Not on file  Other Topics Concern  . Not on file  Social History Narrative  . Not on file    Current Outpatient Medications on File Prior to Visit  Medication Sig Dispense Refill  . albuterol (PROVENTIL HFA;VENTOLIN HFA) 108 (90 Base) MCG/ACT inhaler Inhale 2 puffs into the lungs every 6 (six) hours as needed for wheezing or shortness of breath. 1 Inhaler 4  . clopidogrel (PLAVIX) 75 MG tablet Take 1 tablet (75 mg total) by mouth daily. 30 tablet 2  . Continuous Blood Gluc Sensor (FREESTYLE LIBRE 14 DAY SENSOR) MISC 1 each by Does not apply route every 14 (fourteen) days. 2 each 2  . escitalopram (LEXAPRO) 20 MG tablet Take 1 tablet (20 mg total) by mouth daily. 90 tablet 3  . fluticasone (FLONASE) 50  MCG/ACT nasal spray Place into both nostrils as needed for allergies or rhinitis.    Marland Kitchen gabapentin (NEURONTIN) 100 MG capsule Take 1-3 capsules (100-300 mg total) by mouth See admin instructions. Take 1 capsule every morning then take 2 capsules every evening 180 capsule 3  . Insulin Disposable Pump (V-GO 40) KIT 1 Device by Does not apply route daily. 30 kit 11  . LORazepam (ATIVAN) 1 MG tablet TAKE 1 TABLET BY MOUTH TWICE DAILY AS NEEDED FOR ANXIETY ( FOR ACUTE PANIC ONLY. NO MORE THAN 60 TABS IN ONE YEAR FROM 07/06/17) 20 tablet 2  . methocarbamol (ROBAXIN) 500 MG tablet Take 0.5-1 tablets (250-500 mg total) by mouth 3 (three) times daily. 60 tablet 1  . metoCLOPramide (REGLAN) 10 MG tablet Take 1 tablet (10 mg total) by mouth every 6 (six) hours as needed for nausea. 25 tablet 2  . montelukast (SINGULAIR) 10 MG tablet Take 1 tablet (10 mg total) by mouth at bedtime. 30 tablet 3  . nicotine polacrilex (NICORETTE) 4 MG gum Take 1 each (4 mg total) by mouth as needed for smoking cessation. 100 tablet 1  . nitroGLYCERIN (NITROSTAT) 0.4 MG SL tablet Place 0.4 mg under the tongue every 5 (five) minutes as needed for chest pain.    . pantoprazole (PROTONIX) 40 MG tablet Take 40 mg by mouth 2 (two) times daily.   1  . promethazine (PHENERGAN) 25 MG tablet Take 25 mg by mouth every 6 (six) hours as needed for nausea.   0  . rosuvastatin (CRESTOR) 40 MG tablet Take 1 tablet (40 mg total) by mouth daily. 90 tablet 3  . topiramate ER (QUDEXY XR) CS24 sprinkle capsule Take 1 capsule (100 mg total) by mouth at bedtime. 30 each 3  . traZODone (DESYREL) 100 MG tablet Take 1-1.5 tablets (100-150 mg total) by mouth at bedtime. (Patient taking differently: Take 150 mg by mouth at bedtime. ) 30 tablet 11   No current facility-administered medications on file prior to visit.     Allergies  Allergen Reactions  . Sulfa Antibiotics Other (See Comments)    Burns the inside of my mouth; "blisters; leaves tongue and  inside of mouth solid red"  . Tape Other (See Comments)    Blisters USE PAPER TAPE ONLY Blisters USE PAPER TAPE ONLY  . Decongestant [Pseudoephedrine Hcl Er] Itching    ALL DECONGESTANTS  . Other Itching    ALL  DECONGESTANTS  . Prednisone Other (See Comments)    Gives me a bad attitude  . Percocet [Oxycodone-Acetaminophen] Other (See Comments)    PT reports having nightmares when taking Percocet  . Zoloft [Sertraline Hcl]   . Latex Itching    Family History  Problem Relation Age of Onset  . Breast cancer Unknown   . Diabetes Paternal Grandfather     BP 136/82   Pulse 61   Ht 5' 5.5" (1.664 m)   Wt 191 lb 6.4 oz (86.8 kg)   SpO2 97%   BMI 31.37 kg/m    Review of Systems She denies hypoglycemia.      Objective:   Physical Exam VITAL SIGNS:  See vs page GENERAL: no distress.  Pulses: foot pulses are intact bilaterally.   MSK: no deformity of the feet or ankles.  CV: no edema of the legs or ankles.   Skin:  no ulcer on the feet or ankles.  normal color and temp on the feet and ankles Neuro: sensation is intact to touch on the feet and ankles.    Lab Results  Component Value Date   HGBA1C 6.5 (A) 03/11/2018   Lab Results  Component Value Date   CREATININE 0.79 12/29/2017   BUN 7 12/29/2017   NA 137 12/29/2017   K 3.7 12/29/2017   CL 107 12/29/2017   CO2 22 12/29/2017       Assessment & Plan:  Insulin-requiring type 2 DM, with CVA: The pattern of his cbg's indicates she needs some adjustment in his therapy  Patient Instructions  Please continue the same V-Go, with same clicks, except take 2 clicks with breakfast, no matter what your blood sugar is.  Please come back for a follow-up appointment in 3 months.   check your blood sugar twice a day.  vary the time of day when you check, between before the 3 meals, and at bedtime.  also check if you have symptoms of your blood sugar being too high or too low.  please keep a record of the readings and bring it to  your next appointment here (or you can bring the meter itself).  You can write it on any piece of paper.  please call us sooner if your blood sugar goes below 70, or if you have a lot of readings over 200.

## 2018-03-11 NOTE — Patient Instructions (Addendum)
Please continue the same V-Go, with same clicks, except take 2 clicks with breakfast, no matter what your blood sugar is.  Please come back for a follow-up appointment in 3 months.   check your blood sugar twice a day.  vary the time of day when you check, between before the 3 meals, and at bedtime.  also check if you have symptoms of your blood sugar being too high or too low.  please keep a record of the readings and bring it to your next appointment here (or you can bring the meter itself).  You can write it on any piece of paper.  please call us sooner if your blood sugar goes below 70, or if you have a lot of readings over 200.

## 2018-03-12 DIAGNOSIS — Z0271 Encounter for disability determination: Secondary | ICD-10-CM

## 2018-03-18 LAB — HM DIABETES EYE EXAM

## 2018-03-24 NOTE — Progress Notes (Signed)
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 Without macula edema and diabetes type 2

## 2018-03-28 ENCOUNTER — Other Ambulatory Visit: Payer: Self-pay | Admitting: Internal Medicine

## 2018-04-15 ENCOUNTER — Encounter: Payer: Self-pay | Admitting: Physician Assistant

## 2018-04-15 ENCOUNTER — Other Ambulatory Visit: Payer: Self-pay

## 2018-04-15 ENCOUNTER — Ambulatory Visit (INDEPENDENT_AMBULATORY_CARE_PROVIDER_SITE_OTHER): Payer: BLUE CROSS/BLUE SHIELD | Admitting: Physician Assistant

## 2018-04-15 VITALS — BP 138/81 | HR 69 | Temp 98.2°F | Resp 18 | Ht 65.5 in | Wt 193.0 lb

## 2018-04-15 DIAGNOSIS — Z736 Limitation of activities due to disability: Secondary | ICD-10-CM

## 2018-04-15 DIAGNOSIS — F17200 Nicotine dependence, unspecified, uncomplicated: Secondary | ICD-10-CM

## 2018-04-15 DIAGNOSIS — G8929 Other chronic pain: Secondary | ICD-10-CM

## 2018-04-15 DIAGNOSIS — I6930 Unspecified sequelae of cerebral infarction: Secondary | ICD-10-CM | POA: Diagnosis not present

## 2018-04-15 DIAGNOSIS — M7918 Myalgia, other site: Secondary | ICD-10-CM | POA: Diagnosis not present

## 2018-04-15 DIAGNOSIS — I639 Cerebral infarction, unspecified: Secondary | ICD-10-CM

## 2018-04-15 MED ORDER — METHOCARBAMOL 500 MG PO TABS: 250.0000 mg | ORAL_TABLET | Freq: Three times a day (TID) | ORAL | 1 refills | Status: DC

## 2018-04-15 NOTE — Progress Notes (Signed)
Kristie Tapia  MRN: [id number] DOB: June 01, 1971  PCP: Dorise Hiss, PA-C  Subjective:  Pt is a 47 year old female who presents to clinic for paperwork.  She needs a letter for food stamps proving that she cannot work. Philis Fendt, PA-C filled out disability form 11/2017.  Endorses flare-up of her sciatica. Would like refill of muscle relaxer.   Pt  has a past medical history of Anemia, Anxiety, Complication of anesthesia (` 1998), Gallstones, GERD (gastroesophageal reflux disease), H/O hiatal hernia, History of stomach ulcers, Migraines, Sleep apnea, Stroke ([place]), and Type II diabetes mellitus ([place]).  Review of Systems  Musculoskeletal: Positive for arthralgias, back pain and myalgias.  Neurological: Negative for weakness and numbness.    Patient Active Problem List   Diagnosis Date Noted  . CVA (cerebral vascular accident) ([place]) 12/27/2017  . Tobacco abuse 12/27/2017  . Obesity 12/27/2017  . Ischemic stroke ([place]) 12/27/2017  . Diabetic peripheral neuropathy ([place]) 12/27/2017  . Smoker unmotivated to quit 07/07/2017  . Chronic GERD 07/07/2017  . Chronic anxiety 07/07/2017  . Dyslipidemia 02/24/2017  . Diabetes ([place]) 02/24/2017  . Headache 02/24/2017    Current Outpatient Medications on File Prior to Visit  Medication Sig Dispense Refill  . albuterol (PROVENTIL HFA;VENTOLIN HFA) 108 (90 Base) MCG/ACT inhaler Inhale 2 puffs into the lungs every 6 (six) hours as needed for wheezing or shortness of breath. 1 Inhaler 4  . clopidogrel (PLAVIX) 75 MG tablet Take 1 tablet (75 mg total) by mouth daily. 30 tablet 2  . Continuous Blood Gluc Sensor (FREESTYLE LIBRE 14 DAY SENSOR) MISC USE AS DIRECTED FOR  14  DAYS 2 each 2  . escitalopram (LEXAPRO) 20 MG tablet Take 1 tablet (20 mg total) by mouth daily. 90 tablet 3  . fluticasone (FLONASE) 50 MCG/ACT nasal spray Place into both nostrils as needed for allergies or rhinitis.    Marland Kitchen gabapentin (NEURONTIN) 100 MG capsule Take  1-3 capsules (100-300 mg total) by mouth See admin instructions. Take 1 capsule every morning then take 2 capsules every evening 180 capsule 3  . Insulin Disposable Pump (V-GO 40) KIT 1 Device by Does not apply route daily. 30 kit 11  . insulin regular (NOVOLIN R) 100 units/mL injection For use in pump, total of 60 units/day 20 mL 11  . LORazepam (ATIVAN) 1 MG tablet TAKE 1 TABLET BY MOUTH TWICE DAILY AS NEEDED FOR ANXIETY ( FOR ACUTE PANIC ONLY. NO MORE THAN 60 TABS IN ONE YEAR FROM 07/06/17) 20 tablet 2  . methocarbamol (ROBAXIN) 500 MG tablet Take 0.5-1 tablets (250-500 mg total) by mouth 3 (three) times daily. 60 tablet 1  . metoCLOPramide (REGLAN) 10 MG tablet Take 1 tablet (10 mg total) by mouth every 6 (six) hours as needed for nausea. 25 tablet 2  . montelukast (SINGULAIR) 10 MG tablet Take 1 tablet (10 mg total) by mouth at bedtime. 30 tablet 3  . nicotine polacrilex (NICORETTE) 4 MG gum Take 1 each (4 mg total) by mouth as needed for smoking cessation. 100 tablet 1  . nitroGLYCERIN (NITROSTAT) 0.4 MG SL tablet Place 0.4 mg under the tongue every 5 (five) minutes as needed for chest pain.    . pantoprazole (PROTONIX) 40 MG tablet Take 40 mg by mouth 2 (two) times daily.   1  . promethazine (PHENERGAN) 25 MG tablet Take 25 mg by mouth every 6 (six) hours as needed for nausea.   0  . rosuvastatin (CRESTOR) 40 MG tablet Take 1  tablet (40 mg total) by mouth daily. 90 tablet 3  . traZODone (DESYREL) 100 MG tablet Take 1-1.5 tablets (100-150 mg total) by mouth at bedtime. (Patient taking differently: Take 150 mg by mouth at bedtime. ) 30 tablet 11  . topiramate ER (QUDEXY XR) CS24 sprinkle capsule Take 1 capsule (100 mg total) by mouth at bedtime. (Patient not taking: Reported on 04/15/2018) 30 each 3   No current facility-administered medications on file prior to visit.     Allergies  Allergen Reactions  . Sulfa Antibiotics Other (See Comments)    Burns the inside of my mouth; "blisters;  leaves tongue and inside of mouth solid red"  . Tape Other (See Comments)    Blisters USE PAPER TAPE ONLY Blisters USE PAPER TAPE ONLY  . Decongestant [Pseudoephedrine Hcl Er] Itching    ALL DECONGESTANTS  . Other Itching    ALL DECONGESTANTS  . Prednisone Other (See Comments)    Gives me a bad attitude  . Percocet [Oxycodone-Acetaminophen] Other (See Comments)    PT reports having nightmares when taking Percocet  . Zoloft [Sertraline Hcl]   . Latex Itching     Objective:  BP 138/81   Pulse 69   Temp 98.2 F (36.8 C) (Oral)   Resp 18   Ht 5' 5.5" (1.664 m)   Wt 193 lb (87.5 kg)   SpO2 96%   BMI 31.63 kg/m   Physical Exam  Constitutional: She appears well-developed and well-nourished.  Musculoskeletal:       Lumbar back: She exhibits tenderness and bony tenderness. She exhibits normal range of motion.  Psychiatric: She has a normal mood and affect. Thought content normal.  Vitals reviewed.   Assessment and Plan :  1. Limitation due to disability - Pt presents for letter confirming disability for food stamps. Media files reviewed and confirmed Philis Fendt completed disability forms on 11/21/2017. Letter written and printed out for pt.   2. Cerebrovascular accident (CVA), unspecified mechanism ([place])  3. Ischemic stroke ([place])  4. Smoker unmotivated to quit   5. Chronic musculoskeletal pain - methocarbamol (ROBAXIN) 500 MG tablet; Take 0.5-1 tablets (250-500 mg total) by mouth 3 (three) times daily.  Dispense: 60 tablet; Refill: 1 - Consider PT referral in the future. She agrees.   Mercer Pod, PA-C  [facility] 04/15/2018 1:45 PM  Please note: Portions of this report may have been transcribed using dragon voice recognition software. Every effort was made to ensure accuracy; however, inadvertent computerized transcription errors may be present.

## 2018-04-15 NOTE — Patient Instructions (Addendum)
Try Robaxin for your back and leg pain Consider PT referral in the future.   Thank you for coming in today. I hope you feel we met your needs.  Feel free to call PCP if you have any questions or further requests.  Please consider signing up for MyChart if you do not already have it, as this is a great way to communicate with me.  Best,  Whitney McVey, PA-C   IF you received an x-ray today, you will receive an invoice from [facility]. Please contact [facility] at [phone] with questions or concerns regarding your invoice.   IF you received labwork today, you will receive an invoice from [facility]. Please contact [facility] at [phone] with questions or concerns regarding your invoice.   Our billing staff will not be able to assist you with questions regarding bills from these companies.  You will be contacted with the lab results as soon as they are available. The fastest way to get your results is to activate your My Chart account. Instructions are located on the last page of this paperwork. If you have not heard from [place] regarding the results in 2 weeks, please contact this office.

## 2018-04-16 ENCOUNTER — Other Ambulatory Visit: Payer: Self-pay | Admitting: Physician Assistant

## 2018-04-16 DIAGNOSIS — R29898 Other symptoms and signs involving the musculoskeletal system: Secondary | ICD-10-CM

## 2018-04-18 ENCOUNTER — Other Ambulatory Visit: Payer: Self-pay | Admitting: Endocrinology

## 2018-04-23 ENCOUNTER — Telehealth: Payer: Self-pay | Admitting: Endocrinology

## 2018-04-23 MED ORDER — INSULIN REGULAR HUMAN 100 UNIT/ML IJ SOLN: INTRAMUSCULAR | 11 refills | Status: DC

## 2018-04-23 NOTE — Telephone Encounter (Signed)
Ok, I have sent a prescription to your pharmacy 

## 2018-04-23 NOTE — Telephone Encounter (Signed)
Refill request for plavix 75 mg #90 with 1 refill approved. Dgaddy, CMA

## 2018-04-23 NOTE — Telephone Encounter (Signed)
Please advise if you agree with the pharmacy's recommendation to increase total amount insulin use

## 2018-04-23 NOTE — Telephone Encounter (Signed)
Patient stated that she in running out of her insulin way before it is time to refill.  She is unable to get a refill until Sunday and she doesn't have enough to last until the The pharmacy stated if he sends in a new prescription that she uses 76 units a day.  Please advise   insulin regular (NOVOLIN R) 100 units/mL injection   [address] [place] [address]

## 2018-04-23 NOTE — Telephone Encounter (Signed)
This encounter has been closed as duplicate. This has been addressed in a previously created encounter.

## 2018-04-23 NOTE — Telephone Encounter (Signed)
Called pt to inform her of the Rx sent to her pharmacy by Dr. Everardo AllEllison. LVM

## 2018-04-23 NOTE — Telephone Encounter (Signed)
Pt returned call. Informed Rx has been sent by Dr. Everardo AllEllison to her pharmacy. Verbalized acceptance and understanding

## 2018-04-23 NOTE — Telephone Encounter (Signed)
Patient is returning Ammie's call. °

## 2018-04-24 ENCOUNTER — Ambulatory Visit: Payer: BLUE CROSS/BLUE SHIELD | Admitting: Neurology

## 2018-05-29 ENCOUNTER — Telehealth: Payer: Self-pay | Admitting: Endocrinology

## 2018-05-29 ENCOUNTER — Other Ambulatory Visit: Payer: Self-pay

## 2018-05-29 MED ORDER — INSULIN REGULAR HUMAN 100 UNIT/ML IJ SOLN: INTRAMUSCULAR | 11 refills | Status: DC

## 2018-05-29 NOTE — Telephone Encounter (Signed)
Patient called last month on the 20 about her insulin being increased due to her using more then what Dr Everardo AllEllison wrote the prescription for. She stated that he did not fix this he just sent in the same prescription. So patient is out of her insulin now. Please advise   Previous message from last month below.     "Patient stated that she in running out of her insulin way before it is time to refill.  She is unable to get a refill until Sunday and she doesn't have enough to last until the The pharmacy stated if he sends in a new prescription that she uses 76 units a day.  Please advise   insulin regular (NOVOLIN R) 100 units/mL injection   [address] [place] [address]"

## 2018-05-29 NOTE — Telephone Encounter (Signed)
New rx from 04/23/18 never went to pharmacy according to pharmacist (no print option was clicked) so I resent it

## 2018-05-29 NOTE — Telephone Encounter (Signed)
Please advise 

## 2018-05-29 NOTE — Telephone Encounter (Signed)
I need to know how many units per day, and the # of vials she needs.

## 2018-06-06 ENCOUNTER — Ambulatory Visit (HOSPITAL_COMMUNITY)
Admission: EM | Admit: 2018-06-06 | Discharge: 2018-06-06 | Disposition: A | Discharge status: Home / Self Care | Payer: BLUE CROSS/BLUE SHIELD | Attending: Family Medicine | Admitting: Family Medicine

## 2018-06-06 ENCOUNTER — Other Ambulatory Visit: Payer: Self-pay

## 2018-06-06 ENCOUNTER — Encounter (HOSPITAL_COMMUNITY): Payer: Self-pay | Admitting: Emergency Medicine

## 2018-06-06 DIAGNOSIS — B9789 Other viral agents as the cause of diseases classified elsewhere: Secondary | ICD-10-CM | POA: Diagnosis not present

## 2018-06-06 DIAGNOSIS — Z720 Tobacco use: Secondary | ICD-10-CM

## 2018-06-06 DIAGNOSIS — J069 Acute upper respiratory infection, unspecified: Secondary | ICD-10-CM

## 2018-06-06 DIAGNOSIS — R062 Wheezing: Secondary | ICD-10-CM

## 2018-06-06 MED ORDER — HYDROCODONE-HOMATROPINE 5-1.5 MG/5ML PO SYRP: 5.0000 mL | ORAL_SOLUTION | Freq: Four times a day (QID) | ORAL | 0 refills | Status: DC | PRN

## 2018-06-06 NOTE — Discharge Instructions (Addendum)
Be aware, pain medications may cause drowsiness. Please do not drive, operate heavy machinery or make important decisions while on this medication, it can cloud your judgement.  

## 2018-06-06 NOTE — ED Provider Notes (Signed)
[facility]   [id number] 06/06/18 Arrival Time: 0955  ASSESSMENT & PLAN:  1. Viral URI with cough   2. Tobacco use   3. Wheezing    No signs of pneumonia. No indication for chest imaging at this time. Will return if she begins develop fever or SOB. Discussed.  Meds ordered this encounter  Medications  . HYDROcodone-homatropine (HYCODAN) 5-1.5 MG/5ML syrup    Sig: Take 5 mLs by mouth every 6 (six) hours as needed for cough.    Dispense:  90 mL    Refill:  0   Cough medication sedation precautions. Discussed typical duration of symptoms. OTC symptom care as needed. Ensure adequate fluid intake and rest. May f/u with PCP or here as needed.  Reviewed expectations re: course of current medical issues. Questions answered. Outlined signs and symptoms indicating need for more acute intervention. Patient verbalized understanding. After Visit Summary given.   SUBJECTIVE: History from: patient.  Kristie Tapia is a 48 y.o. female who presents with complaint of nasal congestion, post-nasal drainage, and a persistent dry cough; without sore throat. Onset abrupt, a little over a week ago. Cold symptoms are slowly resolving. Her cough is not and is bothering her the most; affecting sleep. Overall with fatigue and without body aches. SOB: none. Wheezing: questions at times; has an inhaler at home but has only used a couple of times over the past week; "helps a little maybe". No chest pain reported. Ambulatory without difficulty. Fever: no. Overall normal PO intake without n/v. Known sick contacts: no. No specific or significant aggravating or alleviating factors reported. OTC treatment: Alka-Seltzer cold medicine without help.  Received flu shot this year: no.  Social History   Tobacco Use  Smoking Status Current Every Day Smoker  . Packs/day: 1.00  . Years: 29.00  . Pack years: 29.00  . Types: Cigarettes  . Last attempt to quit: 03/05/2017  . Years since quitting: 1.2    Smokeless Tobacco Never Used  Tobacco Comment   has smoked since age 61   ROS: As per HPI. All other systems negative.   OBJECTIVE:  Vitals:   06/06/18 1103  BP: 126/69  Pulse: 70  Temp: 97.9 F (36.6 C)  TempSrc: Oral  SpO2: 97%    General appearance: alert; appears fatigued HEENT: nasal congestion; clear runny nose; throat irritation secondary to post-nasal drainage Neck: supple without LAD CV: RRR Lungs: unlabored respirations, symmetrical air entry with mild bilateral expiratory wheezing; cough: moderate and non-productive; able to speak full sentences without difficulty Abd: soft Ext: no LE edema Skin: warm and dry Psychological: alert and cooperative; normal mood and affect   Allergies  Allergen Reactions  . Sulfa Antibiotics Other (See Comments)    Burns the inside of my mouth; "blisters; leaves tongue and inside of mouth solid red"  . Tape Other (See Comments)    Blisters USE PAPER TAPE ONLY Blisters USE PAPER TAPE ONLY  . Decongestant [Pseudoephedrine Hcl Er] Itching    ALL DECONGESTANTS  . Other Itching    ALL DECONGESTANTS  . Prednisone Other (See Comments)    Gives me a bad attitude  . Percocet [Oxycodone-Acetaminophen] Other (See Comments)    PT reports having nightmares when taking Percocet  . Zoloft [Sertraline Hcl]   . Latex Itching    Past Medical History:  Diagnosis Date  . Anemia   . Anxiety   . Complication of anesthesia ` 1998   "lungs collapsed during LEEP procedure"  . Gallstones   .  GERD (gastroesophageal reflux disease)   . H/O hiatal hernia   . History of stomach ulcers   . Migraines   . Sleep apnea   . Stroke ([facility])   . Type II diabetes mellitus ([facility])    Family History  Problem Relation Age of Onset  . Breast cancer Unknown   . Diabetes Paternal Grandfather    Social History   Socioeconomic History  . Marital status: Married    Spouse name: Not on file  . Number of children: 3  . Years of education: Not on file  .  Highest education level: Not on file  Occupational History  . Not on file  Social Needs  . Financial resource strain: Not on file  . Food insecurity:    Worry: Not on file    Inability: Not on file  . Transportation needs:    Medical: Not on file    Non-medical: Not on file  Tobacco Use  . Smoking status: Current Every Day Smoker    Packs/day: 1.00    Years: 29.00    Pack years: 29.00    Types: Cigarettes    Last attempt to quit: 03/05/2017    Years since quitting: 1.2  . Smokeless tobacco: Never Used  . Tobacco comment: has smoked since age 23  Substance and Sexual Activity  . Alcohol use: No    Comment: hasn't  had s drink in over 5 yrs  . Drug use: No  . Sexual activity: Yes  Lifestyle  . Physical activity:    Days per week: Not on file    Minutes per session: Not on file  . Stress: Not on file  Relationships  . Social connections:    Talks on phone: Not on file    Gets together: Not on file    Attends religious service: Not on file    Active member of club or organization: Not on file    Attends meetings of clubs or organizations: Not on file    Relationship status: Not on file  . Intimate partner violence:    Fear of current or ex partner: Not on file    Emotionally abused: Not on file    Physically abused: Not on file    Forced sexual activity: Not on file  Other Topics Concern  . Not on file  Social History Narrative  . Not on file           Mardella Layman, MD 06/06/18 1130

## 2018-06-06 NOTE — ED Triage Notes (Signed)
Pt has been suffering from nasal congestion and drainage and a bad cough since Christmas.  Pt has been taking Alkaseltzer Cold with no relief.

## 2018-06-11 ENCOUNTER — Ambulatory Visit: Payer: BLUE CROSS/BLUE SHIELD | Admitting: Endocrinology

## 2018-06-17 ENCOUNTER — Encounter: Payer: Self-pay | Admitting: Endocrinology

## 2018-06-17 ENCOUNTER — Ambulatory Visit (INDEPENDENT_AMBULATORY_CARE_PROVIDER_SITE_OTHER): Payer: BLUE CROSS/BLUE SHIELD | Admitting: Endocrinology

## 2018-06-17 VITALS — BP 124/86 | HR 82 | Ht 65.0 in | Wt 194.0 lb

## 2018-06-17 DIAGNOSIS — E1142 Type 2 diabetes mellitus with diabetic polyneuropathy: Secondary | ICD-10-CM | POA: Diagnosis not present

## 2018-06-17 DIAGNOSIS — Z794 Long term (current) use of insulin: Secondary | ICD-10-CM | POA: Diagnosis not present

## 2018-06-17 LAB — POCT GLYCOSYLATED HEMOGLOBIN (HGB A1C): HEMOGLOBIN A1C: 8.5 % — AB (ref 4.0–5.6)

## 2018-06-17 MED ORDER — SAXAGLIPTIN HCL 2.5 MG PO TABS: 2.5000 mg | ORAL_TABLET | Freq: Every day | ORAL | 11 refills | Status: DC

## 2018-06-17 MED ORDER — NOVOLOG 100 UNIT/ML ~~LOC~~ SOLN: SUBCUTANEOUS | 11 refills | Status: DC

## 2018-06-17 NOTE — Progress Notes (Signed)
Subjective:    Patient ID: Kristie Tapia, female    DOB: 09-17-1970, 48 y.o.   MRN: [id number]  HPI Pt returns for f/u of diabetes mellitus: DM type: Insulin-requiring type 2.  Dx'ed: 9528 Complications: polyneuropathy and CVA.   Therapy: insulin since 2001, and V-GO-40 since 2019.   GDM: never DKA: never Severe hypoglycemia: never Pancreatitis: never Pancreatic imaging: normal on 2017 CT Other: she has seen [facility] for nausea, which limits rx options; she stopped farxiga, due to yeast infection.  Interval history: Pt stopped using the V-GO, as she needed more than the total amount of insulin.  Freestyle libre continuous glucose monitor is downloaded today, and the printout is scanned into the record.  Glucose varies from 120-400.  It is in general lowest fasting.   Past Medical History:  Diagnosis Date  . Anemia   . Anxiety   . Complication of anesthesia ` 1998   "lungs collapsed during LEEP procedure"  . Gallstones   . GERD (gastroesophageal reflux disease)   . H/O hiatal hernia   . History of stomach ulcers   . Migraines   . Sleep apnea   . Stroke ([place])   . Type II diabetes mellitus ([place])     Past Surgical History:  Procedure Laterality Date  . ABDOMINAL EXPLORATION SURGERY     "I've had several"  . ABDOMINAL HYSTERECTOMY  08/1998  . APPENDECTOMY  01/03/2012  . DILATION AND CURETTAGE OF UTERUS    . LAPAROSCOPIC APPENDECTOMY  01/03/2012   Procedure: APPENDECTOMY LAPAROSCOPIC;  Surgeon: Edward Jolly, MD;  Location: [facility];  Service: General;  Laterality: N/A;  . LEEP  1998   "lungs collapsed"  . TUBAL LIGATION  05/1998    Social History   Socioeconomic History  . Marital status: Married    Spouse name: Not on file  . Number of children: 3  . Years of education: Not on file  . Highest education level: Not on file  Occupational History  . Not on file  Social Needs  . Financial resource strain: Not on file  . Food insecurity:    Worry: Not on file     Inability: Not on file  . Transportation needs:    Medical: Not on file    Non-medical: Not on file  Tobacco Use  . Smoking status: Current Every Day Smoker    Packs/day: 1.00    Years: 29.00    Pack years: 29.00    Types: Cigarettes    Last attempt to quit: 03/05/2017    Years since quitting: 1.2  . Smokeless tobacco: Never Used  . Tobacco comment: has smoked since age 55  Substance and Sexual Activity  . Alcohol use: No    Comment: hasn't  had s drink in over 5 yrs  . Drug use: No  . Sexual activity: Yes  Lifestyle  . Physical activity:    Days per week: Not on file    Minutes per session: Not on file  . Stress: Not on file  Relationships  . Social connections:    Talks on phone: Not on file    Gets together: Not on file    Attends religious service: Not on file    Active member of club or organization: Not on file    Attends meetings of clubs or organizations: Not on file    Relationship status: Not on file  . Intimate partner violence:    Fear of current or ex partner: Not on  file    Emotionally abused: Not on file    Physically abused: Not on file    Forced sexual activity: Not on file  Other Topics Concern  . Not on file  Social History Narrative  . Not on file    Current Outpatient Medications on File Prior to Visit  Medication Sig Dispense Refill  . albuterol (PROVENTIL HFA;VENTOLIN HFA) 108 (90 Base) MCG/ACT inhaler Inhale 2 puffs into the lungs every 6 (six) hours as needed for wheezing or shortness of breath. 1 Inhaler 4  . clopidogrel (PLAVIX) 75 MG tablet TAKE 1 TABLET BY MOUTH ONCE DAILY 90 tablet 1  . escitalopram (LEXAPRO) 20 MG tablet Take 1 tablet (20 mg total) by mouth daily. 90 tablet 3  . fluticasone (FLONASE) 50 MCG/ACT nasal spray Place into both nostrils as needed for allergies or rhinitis.    Marland Kitchen gabapentin (NEURONTIN) 100 MG capsule Take 1-3 capsules (100-300 mg total) by mouth See admin instructions. Take 1 capsule every morning then take 2  capsules every evening 180 capsule 3  . Insulin Disposable Pump (V-GO 40) KIT 1 Device by Does not apply route daily. 30 kit 11  . insulin regular (NOVOLIN R) 100 units/mL injection For use in pump, total of 76 units/day 30 mL 11  . LORazepam (ATIVAN) 1 MG tablet TAKE 1 TABLET BY MOUTH TWICE DAILY AS NEEDED FOR ANXIETY ( FOR ACUTE PANIC ONLY. NO MORE THAN 60 TABS IN ONE YEAR FROM 07/06/17) 20 tablet 2  . methocarbamol (ROBAXIN) 500 MG tablet Take 0.5-1 tablets (250-500 mg total) by mouth 3 (three) times daily. 60 tablet 1  . metoCLOPramide (REGLAN) 10 MG tablet Take 1 tablet (10 mg total) by mouth every 6 (six) hours as needed for nausea. 25 tablet 2  . montelukast (SINGULAIR) 10 MG tablet Take 1 tablet (10 mg total) by mouth at bedtime. 30 tablet 3  . nicotine polacrilex (NICORETTE) 4 MG gum Take 1 each (4 mg total) by mouth as needed for smoking cessation. 100 tablet 1  . nitroGLYCERIN (NITROSTAT) 0.4 MG SL tablet Place 0.4 mg under the tongue every 5 (five) minutes as needed for chest pain.    . pantoprazole (PROTONIX) 40 MG tablet Take 40 mg by mouth 2 (two) times daily.   1  . promethazine (PHENERGAN) 25 MG tablet Take 25 mg by mouth every 6 (six) hours as needed for nausea.   0  . rosuvastatin (CRESTOR) 40 MG tablet Take 1 tablet (40 mg total) by mouth daily. 90 tablet 3  . traZODone (DESYREL) 100 MG tablet Take 1-1.5 tablets (100-150 mg total) by mouth at bedtime. (Patient taking differently: Take 150 mg by mouth at bedtime. ) 30 tablet 11  . topiramate ER (QUDEXY XR) CS24 sprinkle capsule Take 1 capsule (100 mg total) by mouth at bedtime. (Patient not taking: Reported on 04/15/2018) 30 each 3   No current facility-administered medications on file prior to visit.     Allergies  Allergen Reactions  . Sulfa Antibiotics Other (See Comments)    Burns the inside of my mouth; "blisters; leaves tongue and inside of mouth solid red"  . Tape Other (See Comments)    Blisters USE PAPER TAPE  ONLY Blisters USE PAPER TAPE ONLY  . Decongestant [Pseudoephedrine Hcl Er] Itching    ALL DECONGESTANTS  . Other Itching    ALL DECONGESTANTS  . Prednisone Other (See Comments)    Gives me a bad attitude  . Percocet [Oxycodone-Acetaminophen] Other (See Comments)  PT reports having nightmares when taking Percocet  . Zoloft [Sertraline Hcl]   . Latex Itching    Family History  Problem Relation Age of Onset  . Breast cancer Unknown   . Diabetes Paternal Grandfather     BP 124/86 (BP Location: Right Arm, Patient Position: Sitting, Cuff Size: Normal)   Pulse 82   Ht 5' 5"  (1.651 m)   Wt 194 lb (88 kg)   SpO2 96%   BMI 32.28 kg/m   Review of Systems She denies hypoglycemia.      Objective:   Physical Exam VITAL SIGNS:  See vs page GENERAL: no distress Pulses: dorsalis pedis intact bilat.   MSK: no deformity of the feet CV: no leg edema Skin:  no ulcer on the feet.  normal color and temp on the feet.  Neuro: sensation is intact to touch on the feet.   Lab Results  Component Value Date   HGBA1C 8.5 (A) 06/17/2018   Lab Results  Component Value Date   CREATININE 0.79 12/29/2017   BUN 7 12/29/2017   NA 137 12/29/2017   K 3.7 12/29/2017   CL 107 12/29/2017   CO2 22 12/29/2017       Assessment & Plan:  Insulin-requiring type 2 DM, with PN: worse.  She needs non-insulin rx on order to reduce insulin need to that which V-GO can deliver.  She agrees to re-try V-GO.  CVA: she needs to avoid hypoglycemia.  Nausea: this limits rx options.    Patient Instructions  I have sent a prescription to your pharmacy, to start "Tradjenta." Please resume the V-Go, with 3-6 clicks, 3 times a day (just before each meal).  You can also take 1 click for a blood sugar over 200.  Please come back for a follow-up appointment in 2 months.   check your blood sugar twice a day.  vary the time of day when you check, between before the 3 meals, and at bedtime.  also check if you have  symptoms of your blood sugar being too high or too low.  please keep a record of the readings and bring it to your next appointment here (or you can bring the meter itself).  You can write it on any piece of paper.  please call us sooner if your blood sugar goes below 70, or if you have a lot of readings over 200.

## 2018-06-17 NOTE — Patient Instructions (Addendum)
I have sent a prescription to your pharmacy, to start "Tradjenta." Please resume the V-Go, with 3-6 clicks, 3 times a day (just before each meal).  You can also take 1 click for a blood sugar over 200.  Please come back for a follow-up appointment in 2 months.   check your blood sugar twice a day.  vary the time of day when you check, between before the 3 meals, and at bedtime.  also check if you have symptoms of your blood sugar being too high or too low.  please keep a record of the readings and bring it to your next appointment here (or you can bring the meter itself).  You can write it on any piece of paper.  please call us sooner if your blood sugar goes below 70, or if you have a lot of readings over 200.

## 2018-06-20 ENCOUNTER — Other Ambulatory Visit: Payer: Self-pay | Admitting: Internal Medicine

## 2018-07-05 ENCOUNTER — Encounter: Payer: Self-pay | Admitting: Emergency Medicine

## 2018-07-05 ENCOUNTER — Ambulatory Visit (INDEPENDENT_AMBULATORY_CARE_PROVIDER_SITE_OTHER): Payer: BLUE CROSS/BLUE SHIELD | Admitting: Emergency Medicine

## 2018-07-05 ENCOUNTER — Other Ambulatory Visit: Payer: Self-pay

## 2018-07-05 VITALS — BP 123/78 | HR 89 | Temp 97.8°F | Resp 17 | Ht 65.0 in | Wt 192.0 lb

## 2018-07-05 DIAGNOSIS — J22 Unspecified acute lower respiratory infection: Secondary | ICD-10-CM | POA: Diagnosis not present

## 2018-07-05 DIAGNOSIS — M7918 Myalgia, other site: Secondary | ICD-10-CM

## 2018-07-05 DIAGNOSIS — R05 Cough: Secondary | ICD-10-CM | POA: Diagnosis not present

## 2018-07-05 DIAGNOSIS — G8929 Other chronic pain: Secondary | ICD-10-CM

## 2018-07-05 DIAGNOSIS — R059 Cough, unspecified: Secondary | ICD-10-CM

## 2018-07-05 MED ORDER — AZITHROMYCIN 250 MG PO TABS: ORAL_TABLET | ORAL | 0 refills | Status: DC

## 2018-07-05 MED ORDER — IPRATROPIUM BROMIDE HFA 17 MCG/ACT IN AERS: 2.0000 | INHALATION_SPRAY | Freq: Two times a day (BID) | RESPIRATORY_TRACT | 12 refills | Status: DC

## 2018-07-05 MED ORDER — BENZONATATE 200 MG PO CAPS: 200.0000 mg | ORAL_CAPSULE | Freq: Two times a day (BID) | ORAL | 0 refills | Status: DC | PRN

## 2018-07-05 MED ORDER — METHOCARBAMOL 500 MG PO TABS: 250.0000 mg | ORAL_TABLET | Freq: Two times a day (BID) | ORAL | 1 refills | Status: DC | PRN

## 2018-07-05 NOTE — Progress Notes (Signed)
Kristie Tapia 48 y.o.   Chief Complaint  Patient presents with  . URI    since Christmas and has been feeling bad since, rn,cough, drainage is clear but when she picks up mucus its green.  Pain in back  like its in a circle mid back.  Nasal congestion and coughing.  Taking allergy meds.  Tried alka seltzer cold and flu with no relief was the first otc she tried.  No fevers per pt but bodyaches, unable to sleep due to cough and is having some sneezing/wheezing.  Using inhaler but only works for a short period of time    HISTORY OF PRESENT ILLNESS: This is a 48 y.o. female complaining of 5-week history of flulike symptoms complaining of persistent productive cough with nasal and chest congestion.  Occasional sneezing and wheezing.  Not able to sleep due to cough.  Diabetic, smoker, history of an ischemic stroke, on long-term anticoagulation with Plavix, history of asthma, using albuterol as needed.  First visit ever with me.  HPI   Prior to Admission medications   Medication Sig Start Date End Date Taking? Authorizing Provider  albuterol (PROVENTIL HFA;VENTOLIN HFA) 108 (90 Base) MCG/ACT inhaler Inhale 2 puffs into the lungs every 6 (six) hours as needed for wheezing or shortness of breath. 02/26/18  Yes McVey, Gelene Mink, PA-C  clopidogrel (PLAVIX) 75 MG tablet TAKE 1 TABLET BY MOUTH ONCE DAILY 04/23/18  Yes Stallings, Zoe A, MD  Continuous Blood Gluc Sensor (FREESTYLE LIBRE 14 DAY SENSOR) MISC USE AS DIRECTED FOR 14 DAYS 06/20/18  Yes Philemon Kingdom, MD  escitalopram (LEXAPRO) 20 MG tablet Take 1 tablet (20 mg total) by mouth daily. 07/06/17  Yes Tereasa Coop, PA-C  fluticasone (FLONASE) 50 MCG/ACT nasal spray Place into both nostrils as needed for allergies or rhinitis.   Yes [provider]  gabapentin (NEURONTIN) 100 MG capsule Take 1-3 capsules (100-300 mg total) by mouth See admin instructions. Take 1 capsule every morning then take 2 capsules every evening 01/13/18   Yes Tereasa Coop, PA-C  Insulin Disposable Pump (V-GO 40) KIT 1 Device by Does not apply route daily. 11/14/17  Yes Renato Shin, MD  insulin regular (NOVOLIN R) 100 units/mL injection For use in pump, total of 76 units/day 05/29/18  Yes Renato Shin, MD  LORazepam (ATIVAN) 1 MG tablet TAKE 1 TABLET BY MOUTH TWICE DAILY AS NEEDED FOR ANXIETY ( FOR ACUTE PANIC ONLY. NO MORE THAN 60 TABS IN ONE YEAR FROM 07/06/17) 01/14/18  Yes Tereasa Coop, PA-C  methocarbamol (ROBAXIN) 500 MG tablet Take 0.5-1 tablets (250-500 mg total) by mouth 3 (three) times daily. 04/15/18  Yes McVey, Gelene Mink, PA-C  montelukast (SINGULAIR) 10 MG tablet Take 1 tablet (10 mg total) by mouth at bedtime. 03/08/18  Yes Rutherford Guys, MD  nicotine polacrilex (NICORETTE) 4 MG gum Take 1 each (4 mg total) by mouth as needed for smoking cessation. 01/01/18  Yes Tereasa Coop, PA-C  nitroGLYCERIN (NITROSTAT) 0.4 MG SL tablet Place 0.4 mg under the tongue every 5 (five) minutes as needed for chest pain.   Yes [provider]  NOVOLOG 100 UNIT/ML injection For use in pump, total of 80 units per day. 06/17/18  Yes Renato Shin, MD  OnabotulinumtoxinA (BOTOX IM) Inject into the muscle. For migraines   Yes [provider]  pantoprazole (PROTONIX) 40 MG tablet Take 40 mg by mouth 2 (two) times daily.  08/12/17  Yes [provider]  promethazine (PHENERGAN) 25  MG tablet Take 25 mg by mouth every 6 (six) hours as needed for nausea.  08/05/17  Yes [provider]  rosuvastatin (CRESTOR) 40 MG tablet Take 1 tablet (40 mg total) by mouth daily. 07/07/17  Yes Tereasa Coop, PA-C  saxagliptin HCl (ONGLYZA) 2.5 MG TABS tablet Take 1 tablet (2.5 mg total) by mouth daily. 06/17/18  Yes Renato Shin, MD  traZODone (DESYREL) 100 MG tablet Take 1-1.5 tablets (100-150 mg total) by mouth at bedtime. Patient taking differently: Take 150 mg by mouth at bedtime.  12/11/17  Yes Tereasa Coop, PA-C    metoCLOPramide (REGLAN) 10 MG tablet Take 1 tablet (10 mg total) by mouth every 6 (six) hours as needed for nausea. Patient not taking: Reported on 07/05/2018 06/12/17   [name], DO  topiramate ER (QUDEXY XR) CS24 sprinkle capsule Take 1 capsule (100 mg total) by mouth at bedtime. Patient not taking: Reported on 07/05/2018 12/19/17   [name], DO    Allergies  Allergen Reactions  . Sulfa Antibiotics Other (See Comments)    Burns the inside of my mouth; "blisters; leaves tongue and inside of mouth solid red"  . Tape Other (See Comments)    Blisters USE PAPER TAPE ONLY Blisters USE PAPER TAPE ONLY  . Decongestant [Pseudoephedrine Hcl Er] Itching    ALL DECONGESTANTS  . Other Itching    ALL DECONGESTANTS  . Prednisone Other (See Comments)    Gives me a bad attitude  . Percocet [Oxycodone-Acetaminophen] Other (See Comments)    PT reports having nightmares when taking Percocet  . Zoloft [Sertraline Hcl]   . Latex Itching    Patient Active Problem List   Diagnosis Date Noted  . CVA (cerebral vascular accident) ([place]) 12/27/2017  . Tobacco abuse 12/27/2017  . Obesity 12/27/2017  . Ischemic stroke ([place]) 12/27/2017  . Diabetic peripheral neuropathy ([place]) 12/27/2017  . Smoker unmotivated to quit 07/07/2017  . Chronic GERD 07/07/2017  . Chronic anxiety 07/07/2017  . Dyslipidemia 02/24/2017  . Diabetes ([place]) 02/24/2017  . Headache 02/24/2017    Past Medical History:  Diagnosis Date  . Anemia   . Anxiety   . Complication of anesthesia ` 1998   "lungs collapsed during LEEP procedure"  . Gallstones   . GERD (gastroesophageal reflux disease)   . H/O hiatal hernia   . History of stomach ulcers   . Migraines   . Sleep apnea   . Stroke ([place])   . Type II diabetes mellitus ([place])     Past Surgical History:  Procedure Laterality Date  . ABDOMINAL EXPLORATION SURGERY     "I've had several"  . ABDOMINAL HYSTERECTOMY  08/1998  . APPENDECTOMY  01/03/2012  . DILATION AND  CURETTAGE OF UTERUS    . LAPAROSCOPIC APPENDECTOMY  01/03/2012   Procedure: APPENDECTOMY LAPAROSCOPIC;  Surgeon: Edward Jolly, MD;  Location: [facility];  Service: General;  Laterality: N/A;  . LEEP  1998   "lungs collapsed"  . TUBAL LIGATION  05/1998    Social History   Socioeconomic History  . Marital status: Married    Spouse name: Not on file  . Number of children: 3  . Years of education: Not on file  . Highest education level: Not on file  Occupational History  . Not on file  Social Needs  . Financial resource strain: Not on file  . Food insecurity:    Worry: Not on file    Inability: Not on file  . Transportation  needs:    Medical: Not on file    Non-medical: Not on file  Tobacco Use  . Smoking status: Current Every Day Smoker    Packs/day: 1.00    Years: 29.00    Pack years: 29.00    Types: Cigarettes    Last attempt to quit: 03/05/2017    Years since quitting: 1.3  . Smokeless tobacco: Never Used  . Tobacco comment: has smoked since age 80  Substance and Sexual Activity  . Alcohol use: No    Comment: hasn't  had s drink in over 5 yrs  . Drug use: No  . Sexual activity: Yes  Lifestyle  . Physical activity:    Days per week: Not on file    Minutes per session: Not on file  . Stress: Not on file  Relationships  . Social connections:    Talks on phone: Not on file    Gets together: Not on file    Attends religious service: Not on file    Active member of club or organization: Not on file    Attends meetings of clubs or organizations: Not on file    Relationship status: Not on file  . Intimate partner violence:    Fear of current or ex partner: Not on file    Emotionally abused: Not on file    Physically abused: Not on file    Forced sexual activity: Not on file  Other Topics Concern  . Not on file  Social History Narrative  . Not on file    Family History  Problem Relation Age of Onset  . Breast cancer Unknown   . Diabetes Paternal Grandfather        Review of Systems  Constitutional: Negative.  Negative for chills and fever.  HENT: Positive for congestion and sore throat.   Eyes: Negative.  Negative for blurred vision and double vision.  Respiratory: Positive for cough, sputum production and wheezing. Negative for hemoptysis and shortness of breath.   Cardiovascular: Negative.  Negative for chest pain and palpitations.  Gastrointestinal: Negative.  Negative for nausea and vomiting.  Genitourinary: Negative.  Negative for dysuria and hematuria.  Musculoskeletal: Positive for myalgias.  Skin: Negative.  Negative for rash.  Neurological: Positive for headaches. Negative for dizziness.  Endo/Heme/Allergies: Negative.     Vitals:   07/05/18 1220  BP: 123/78  Pulse: 89  Resp: 17  Temp: 97.8 F (36.6 C)  SpO2: 94%    Physical Exam Vitals signs reviewed.  Constitutional:      Appearance: Normal appearance. She is not toxic-appearing.  HENT:     Head: Normocephalic and atraumatic.     Nose: Nose normal.     Mouth/Throat:     Mouth: Mucous membranes are moist.     Pharynx: Oropharynx is clear.  Eyes:     Extraocular Movements: Extraocular movements intact.     Conjunctiva/sclera: Conjunctivae normal.     Pupils: Pupils are equal, round, and reactive to light.  Neck:     Musculoskeletal: Normal range of motion and neck supple.  Cardiovascular:     Rate and Rhythm: Normal rate and regular rhythm.     Heart sounds: Normal heart sounds.  Pulmonary:     Effort: Pulmonary effort is normal.     Breath sounds: Normal breath sounds.  Musculoskeletal: Normal range of motion.  Skin:    General: Skin is warm and dry.     Capillary Refill: Capillary refill takes less than 2 seconds.  Neurological:     General: No focal deficit present.     Mental Status: She is alert and oriented to person, place, and time.  Psychiatric:        Mood and Affect: Mood normal.        Behavior: Behavior normal.     A total of 25 minutes  was spent in the room with the patient, greater than 50% of which was in counseling/coordination of care regarding differential diagnosis, treatment and management, medications and side effects, prognosis, and need for follow-up if no better or worse.   ASSESSMENT & PLAN: Lavell was seen today for uri.  Diagnoses and all orders for this visit:  Lower respiratory infection -     azithromycin (ZITHROMAX) 250 MG tablet; Sig as indicated -     ipratropium (ATROVENT HFA) 17 MCG/ACT inhaler; Inhale 2 puffs into the lungs 2 (two) times daily for 7 days.  Cough -     benzonatate (TESSALON) 200 MG capsule; Take 1 capsule (200 mg total) by mouth 2 (two) times daily as needed for cough. -     ipratropium (ATROVENT HFA) 17 MCG/ACT inhaler; Inhale 2 puffs into the lungs 2 (two) times daily for 7 days.  Chronic musculoskeletal pain -     methocarbamol (ROBAXIN) 500 MG tablet; Take 0.5-1 tablets (250-500 mg total) by mouth 2 (two) times daily as needed for muscle spasms.    Patient Instructions       If you have lab work done today you will be contacted with your lab results within the next 2 weeks.  If you have not heard from [place] then please contact us. The fastest way to get your results is to register for My Chart.   IF you received an x-ray today, you will receive an invoice from [facility]. Please contact [facility] at [phone] with questions or concerns regarding your invoice.   IF you received labwork today, you will receive an invoice from [facility]. Please contact [facility] at [phone] with questions or concerns regarding your invoice.   Our billing staff will not be able to assist you with questions regarding bills from these companies.  You will be contacted with the lab results as soon as they are available. The fastest way to get your results is to activate your My Chart account. Instructions are located on the last page of this paperwork. If you have not  heard from [place] regarding the results in 2 weeks, please contact this office.     Cough, Adult  A cough helps to clear your throat and lungs. A cough may last only 2-3 weeks (acute), or it may last longer than 8 weeks (chronic). Many different things can cause a cough. A cough may be a sign of an illness or another medical condition. Follow these instructions at home:  Pay attention to any changes in your cough.  Take medicines only as told by your doctor. ? If you were prescribed an antibiotic medicine, take it as told by your doctor. Do not stop taking it even if you start to feel better. ? Talk with your doctor before you try using a cough medicine.  Drink enough fluid to keep your pee (urine) clear or pale yellow.  If the air is dry, use a cold steam vaporizer or humidifier in your home.  Stay away from things that make you cough at work or at home.  If your cough is worse at night, try using extra pillows to raise  your head up higher while you sleep.  Do not smoke, and try not to be around smoke. If you need help quitting, ask your doctor.  Do not have caffeine.  Do not drink alcohol.  Rest as needed. Contact a doctor if:  You have new problems (symptoms).  You cough up yellow fluid (pus).  Your cough does not get better after 2-3 weeks, or your cough gets worse.  Medicine does not help your cough and you are not sleeping well.  You have pain that gets worse or pain that is not helped with medicine.  You have a fever.  You are losing weight and you do not know why.  You have night sweats. Get help right away if:  You cough up blood.  You have trouble breathing.  Your heartbeat is very fast. This information is not intended to replace advice given to you by your health care provider. Make sure you discuss any questions you have with your health care provider. Document Released: 02/01/2011 Document Revised: 10/27/2015 Document Reviewed: 07/28/2014 Elsevier  Interactive Patient Education  2019 Elsevier Inc.      Agustina Caroli, MD [facility]

## 2018-07-05 NOTE — Patient Instructions (Addendum)
     If you have lab work done today you will be contacted with your lab results within the next 2 weeks.  If you have not heard from us then please contact us. The fastest way to get your results is to register for My Chart.   IF you received an x-ray today, you will receive an invoice from [facility]. Please contact [facility] at [phone] with questions or concerns regarding your invoice.   IF you received labwork today, you will receive an invoice from [facility]. Please contact [facility] at [phone] with questions or concerns regarding your invoice.   Our billing staff will not be able to assist you with questions regarding bills from these companies.  You will be contacted with the lab results as soon as they are available. The fastest way to get your results is to activate your My Chart account. Instructions are located on the last page of this paperwork. If you have not heard from us regarding the results in 2 weeks, please contact this office.     Cough, Adult  A cough helps to clear your throat and lungs. A cough may last only 2-3 weeks (acute), or it may last longer than 8 weeks (chronic). Many different things can cause a cough. A cough may be a sign of an illness or another medical condition. Follow these instructions at home:  Pay attention to any changes in your cough.  Take medicines only as told by your doctor. ? If you were prescribed an antibiotic medicine, take it as told by your doctor. Do not stop taking it even if you start to feel better. ? Talk with your doctor before you try using a cough medicine.  Drink enough fluid to keep your pee (urine) clear or pale yellow.  If the air is dry, use a cold steam vaporizer or humidifier in your home.  Stay away from things that make you cough at work or at home.  If your cough is worse at night, try using extra pillows to raise your head up higher while you sleep.  Do not smoke, and try not to  be around smoke. If you need help quitting, ask your doctor.  Do not have caffeine.  Do not drink alcohol.  Rest as needed. Contact a doctor if:  You have new problems (symptoms).  You cough up yellow fluid (pus).  Your cough does not get better after 2-3 weeks, or your cough gets worse.  Medicine does not help your cough and you are not sleeping well.  You have pain that gets worse or pain that is not helped with medicine.  You have a fever.  You are losing weight and you do not know why.  You have night sweats. Get help right away if:  You cough up blood.  You have trouble breathing.  Your heartbeat is very fast. This information is not intended to replace advice given to you by your health care provider. Make sure you discuss any questions you have with your health care provider. Document Released: 02/01/2011 Document Revised: 10/27/2015 Document Reviewed: 07/28/2014 Elsevier Interactive Patient Education  2019 Elsevier Inc.  

## 2018-07-06 ENCOUNTER — Encounter: Payer: Self-pay | Admitting: Emergency Medicine

## 2018-07-06 DIAGNOSIS — R059 Cough, unspecified: Secondary | ICD-10-CM | POA: Insufficient documentation

## 2018-07-06 DIAGNOSIS — M7918 Myalgia, other site: Secondary | ICD-10-CM | POA: Insufficient documentation

## 2018-07-06 DIAGNOSIS — J22 Unspecified acute lower respiratory infection: Secondary | ICD-10-CM | POA: Insufficient documentation

## 2018-07-06 DIAGNOSIS — R05 Cough: Secondary | ICD-10-CM | POA: Insufficient documentation

## 2018-07-06 DIAGNOSIS — G8929 Other chronic pain: Secondary | ICD-10-CM | POA: Insufficient documentation

## 2018-07-10 ENCOUNTER — Telehealth: Payer: Self-pay | Admitting: Emergency Medicine

## 2018-07-10 ENCOUNTER — Other Ambulatory Visit: Payer: Self-pay | Admitting: Family Medicine

## 2018-07-10 ENCOUNTER — Other Ambulatory Visit: Payer: Self-pay | Admitting: Physician Assistant

## 2018-07-10 DIAGNOSIS — Z794 Long term (current) use of insulin: Secondary | ICD-10-CM

## 2018-07-10 DIAGNOSIS — F17200 Nicotine dependence, unspecified, uncomplicated: Secondary | ICD-10-CM

## 2018-07-10 DIAGNOSIS — E1142 Type 2 diabetes mellitus with diabetic polyneuropathy: Secondary | ICD-10-CM

## 2018-07-10 DIAGNOSIS — F419 Anxiety disorder, unspecified: Secondary | ICD-10-CM

## 2018-07-10 DIAGNOSIS — Z9189 Other specified personal risk factors, not elsewhere classified: Secondary | ICD-10-CM

## 2018-07-10 NOTE — Telephone Encounter (Signed)
Copied from CRM [id number]. Topic: Quick Communication - Rx Refill/Question >> Jul 10, 2018  1:45 PM Maia Petties wrote: Medication: ipratropium (ATROVENT HFA) 17 MCG/ACT inhaler - pt states the pharmacy told her PA was required and form was faxed back to Dr. Alvy Bimler. Please advise pt on  prior auth status.  Has the patient contacted their pharmacy? Yes  Preferred Pharmacy (with phone number or street name): [facility] - [place], [place] - [address] [phone] (Phone) [phone] (Fax)

## 2018-07-11 NOTE — Telephone Encounter (Signed)
Please advise  Patient is requesting a refill of the following medications: Requested Prescriptions   Pending Prescriptions Disp Refills  . rosuvastatin (CRESTOR) 40 MG tablet [Pharmacy Med Name: Rosuvastatin Calcium 40 MG Oral Tablet] 90 tablet 0    Sig: TAKE 1 TABLET BY MOUTH ONCE DAILY  . traZODone (DESYREL) 100 MG tablet [Pharmacy Med Name: traZODone HCl 100 MG Oral Tablet] 30 tablet 0    Sig: TAKE 1 TABLET BY MOUTH ONCE DAILY AT BEDTIME

## 2018-07-11 NOTE — Telephone Encounter (Signed)
Requested Prescriptions  Pending Prescriptions Disp Refills  . montelukast (SINGULAIR) 10 MG tablet [Pharmacy Med Name: Montelukast Sodium 10 MG Oral Tablet] 30 tablet 0    Sig: TAKE 1 TABLET BY MOUTH AT BEDTIME     Pulmonology:  Leukotriene Inhibitors Passed - 07/10/2018  8:04 PM      Passed - Valid encounter within last 12 months    Recent Outpatient Visits          6 days ago Lower respiratory infection   Primary Care at [facility], [place], MD   2 months ago Limitation due to disability   Primary Care at [facility], Madelaine Bhat, PA-C   4 months ago Acute non-recurrent frontal sinusitis   [facility] at [facility], Meda Coffee, MD   4 months ago Chronic left-sided thoracic back pain   Primary Care at [facility], Madelaine Bhat, PA-C   5 months ago Uncontrolled type 2 diabetes mellitus with hyperglycemia [facility])   Primary Care at [facility], Marolyn Hammock, PA-C

## 2018-07-18 ENCOUNTER — Other Ambulatory Visit: Payer: Self-pay | Admitting: Physician Assistant

## 2018-07-18 DIAGNOSIS — F419 Anxiety disorder, unspecified: Secondary | ICD-10-CM

## 2018-07-18 NOTE — Telephone Encounter (Signed)
Patient called checking on the status of prior auth. Requesting an alternate inhaler.

## 2018-07-19 ENCOUNTER — Other Ambulatory Visit: Payer: Self-pay | Admitting: Internal Medicine

## 2018-07-22 ENCOUNTER — Telehealth: Payer: Self-pay | Admitting: Emergency Medicine

## 2018-07-22 NOTE — Telephone Encounter (Signed)
I have called pt and informed her she needed to re-establish care with another provider.   Pt then stated that she was seen on 07/05/2018 and she is still not feeling too much better after taking the z-pac.   I have informed her to rest as best she can and have lots of fluids, teas and soup.  Please on any additional recommendations.   Thanks

## 2018-07-22 NOTE — Telephone Encounter (Signed)
Patient was denied the medication Atrovent HFA by her insurance company. Denial letter states that she needs to try and fail Spiriva first before they will approve the Atrovent medication.

## 2018-07-22 NOTE — Telephone Encounter (Signed)
I have called the pt and relayed provider message and she stated if she is not better by Friday then she will make a Saturday appointment. I stated understanding.

## 2018-07-22 NOTE — Telephone Encounter (Signed)
Agree with the advice.  If she is not better she should be seen again.  Thanks.

## 2018-07-25 ENCOUNTER — Emergency Department (HOSPITAL_COMMUNITY): Payer: BLUE CROSS/BLUE SHIELD

## 2018-07-25 ENCOUNTER — Encounter (HOSPITAL_COMMUNITY): Payer: Self-pay

## 2018-07-25 ENCOUNTER — Emergency Department (HOSPITAL_COMMUNITY)
Admission: EM | Admit: 2018-07-25 | Discharge: 2018-07-26 | Disposition: A | Discharge status: Home / Self Care | Payer: BLUE CROSS/BLUE SHIELD | Attending: Emergency Medicine | Admitting: Emergency Medicine

## 2018-07-25 ENCOUNTER — Ambulatory Visit: Payer: Self-pay | Admitting: Physician Assistant

## 2018-07-25 DIAGNOSIS — Z79899 Other long term (current) drug therapy: Secondary | ICD-10-CM | POA: Insufficient documentation

## 2018-07-25 DIAGNOSIS — Z794 Long term (current) use of insulin: Secondary | ICD-10-CM | POA: Insufficient documentation

## 2018-07-25 DIAGNOSIS — F17210 Nicotine dependence, cigarettes, uncomplicated: Secondary | ICD-10-CM | POA: Insufficient documentation

## 2018-07-25 DIAGNOSIS — R519 Headache, unspecified: Secondary | ICD-10-CM

## 2018-07-25 DIAGNOSIS — G8929 Other chronic pain: Secondary | ICD-10-CM

## 2018-07-25 DIAGNOSIS — R51 Headache: Secondary | ICD-10-CM | POA: Diagnosis present

## 2018-07-25 DIAGNOSIS — E119 Type 2 diabetes mellitus without complications: Secondary | ICD-10-CM | POA: Diagnosis not present

## 2018-07-25 LAB — CBC WITH DIFFERENTIAL/PLATELET
Abs Immature Granulocytes: 0.13 10*3/uL — ABNORMAL HIGH (ref 0.00–0.07)
Basophils Absolute: 0.1 10*3/uL (ref 0.0–0.1)
Basophils Relative: 1 %
Eosinophils Absolute: 0.4 10*3/uL (ref 0.0–0.5)
Eosinophils Relative: 3 %
HCT: 52.2 % — ABNORMAL HIGH (ref 36.0–46.0)
Hemoglobin: 17.3 g/dL — ABNORMAL HIGH (ref 12.0–15.0)
Immature Granulocytes: 1 %
LYMPHS ABS: 2.2 10*3/uL (ref 0.7–4.0)
Lymphocytes Relative: 20 %
MCH: 27.4 pg (ref 26.0–34.0)
MCHC: 33.1 g/dL (ref 30.0–36.0)
MCV: 82.6 fL (ref 80.0–100.0)
Monocytes Absolute: 0.6 10*3/uL (ref 0.1–1.0)
Monocytes Relative: 6 %
NRBC: 0 % (ref 0.0–0.2)
Neutro Abs: 7.5 10*3/uL (ref 1.7–7.7)
Neutrophils Relative %: 69 %
Platelets: 338 10*3/uL (ref 150–400)
RBC: 6.32 MIL/uL — ABNORMAL HIGH (ref 3.87–5.11)
RDW: 16.3 % — ABNORMAL HIGH (ref 11.5–15.5)
WBC: 10.9 10*3/uL — AB (ref 4.0–10.5)

## 2018-07-25 LAB — COMPREHENSIVE METABOLIC PANEL
ALBUMIN: 4.2 g/dL (ref 3.5–5.0)
ALT: 21 U/L (ref 0–44)
ANION GAP: 13 (ref 5–15)
AST: 21 U/L (ref 15–41)
Alkaline Phosphatase: 75 U/L (ref 38–126)
BUN: 5 mg/dL — ABNORMAL LOW (ref 6–20)
CO2: 23 mmol/L (ref 22–32)
Calcium: 8.5 mg/dL — ABNORMAL LOW (ref 8.9–10.3)
Chloride: 99 mmol/L (ref 98–111)
Creatinine, Ser: 0.76 mg/dL (ref 0.44–1.00)
GFR calc Af Amer: >60 mL/min (ref >60)
GFR calc non Af Amer: >60 mL/min (ref >60)
Glucose, Bld: 127 mg/dL — ABNORMAL HIGH (ref 70–99)
Potassium: 3.4 mmol/L — ABNORMAL LOW (ref 3.5–5.1)
Sodium: 135 mmol/L (ref 135–145)
TOTAL PROTEIN: 7.1 g/dL (ref 6.5–8.1)
Total Bilirubin: 0.8 mg/dL (ref 0.3–1.2)

## 2018-07-25 LAB — URINALYSIS, ROUTINE W REFLEX MICROSCOPIC
Bilirubin Urine: NEGATIVE
Glucose, UA: NEGATIVE mg/dL
Hgb urine dipstick: NEGATIVE
Ketones, ur: NEGATIVE mg/dL
Leukocytes,Ua: NEGATIVE
Nitrite: NEGATIVE
PH: 6 (ref 5.0–8.0)
Protein, ur: NEGATIVE mg/dL
SPECIFIC GRAVITY, URINE: 1.004 — AB (ref 1.005–1.030)

## 2018-07-25 MED ORDER — SODIUM CHLORIDE 0.9 % IV BOLUS
1000.0000 mL | Freq: Once | INTRAVENOUS | Status: AC
  Administered 2018-07-25: 1000 mL via INTRAVENOUS

## 2018-07-25 MED ORDER — KETOROLAC TROMETHAMINE 15 MG/ML IJ SOLN
15.0000 mg | Freq: Once | INTRAMUSCULAR | Status: AC
  Administered 2018-07-25: 15 mg via INTRAVENOUS
  Filled 2018-07-25: qty 1

## 2018-07-25 MED ORDER — PROCHLORPERAZINE EDISYLATE 10 MG/2ML IJ SOLN
10.0000 mg | Freq: Once | INTRAMUSCULAR | Status: AC
  Administered 2018-07-25: 10 mg via INTRAVENOUS
  Filled 2018-07-25: qty 2

## 2018-07-25 MED ORDER — VALPROATE SODIUM 500 MG/5ML IV SOLN
500.0000 mg | Freq: Once | INTRAVENOUS | Status: AC
  Administered 2018-07-25: 500 mg via INTRAVENOUS
  Filled 2018-07-25: qty 5

## 2018-07-25 MED ORDER — VALPROATE SODIUM 500 MG/5ML IV SOLN: 500.0000 mg | Freq: Once | INTRAVENOUS | Status: DC

## 2018-07-25 MED ORDER — DIPHENHYDRAMINE HCL 50 MG/ML IJ SOLN
25.0000 mg | Freq: Once | INTRAMUSCULAR | Status: AC
  Administered 2018-07-25: 25 mg via INTRAVENOUS
  Filled 2018-07-25: qty 1

## 2018-07-25 MED ORDER — PROMETHAZINE HCL 25 MG/ML IJ SOLN
12.5000 mg | Freq: Once | INTRAMUSCULAR | Status: AC
  Administered 2018-07-25: 12.5 mg via INTRAVENOUS
  Filled 2018-07-25: qty 1

## 2018-07-25 NOTE — Discharge Instructions (Addendum)
Today you were evaluated at the emergency department for a headache. ° °1. Medications: continue usual home medications °2. Treatment: rest, drink plenty of fluids, if headache persists take 800mmg ibuprofen with caffeine °3. Follow Up: Please followup with your primary doctor in 3 days and neurology within 1 week for discussion of your diagnoses and further evaluation after today's visit; if you do not have a primary care doctor use the resource guide provided to find one; Please return to the ER for double vision, speech difficulty, gait disturbance, persistent vomiting or other concerns. ° °Headache: ° °You are having a headache. No specific cause was found today for your headache. It may have been a migraine or other cause of headache. Stress, anxiety, fatigue, and depression are common triggers for headaches. Your headache today does not appear to be life-threatening or require hospitalization, but often the exact cause of headaches is not determined in the emergency department. Therefore, followup with your doctor is very important to find out what may have caused your headache, and whether or not you need any further diagnostic testing or treatment. Sometimes headaches can appear benign but then more serious symptoms can develop which should prompt an immediate reevaluation by your doctor or the emergency department. ° °Hydration: Have a goal of about a half liter of water every couple hours to stay well hydrated.  ° °Sleep: Please be sure to get plenty of sleep with a goal of 8 hours per night. Having a regular bed time and bedtime routine can help with this. ° °Screens: Reduce the amount of time you are in front of screens.  Take about a 5-10-minute break every hour or every couple hours to give your eyes rest.  Do not use screens in dark rooms.  Glasses with a blue light filter may also help reduce eye fatigue. ° °Stress: Take steps to reduce stress as much as possible.  ° °Seek immediate medical attention  if: ° °You develop possible problems with medications prescribed. °The medications don't resolve your headache, if it recurs, or if you have multiple episodes of vomiting or can't take fluids by mouth °You have a change from the usual headache. °If you developed a sudden severe headache or confusion, become poorly responsive or faint, developed a fever above 100.4 or problems breathing, have a change in speech, vision, swallowing or understanding, or developed new weakness, numbness, tingling, incoordination or have a seizure. ° ° ° °

## 2018-07-25 NOTE — ED Notes (Signed)
ED Provider at bedside to discuss disposition 

## 2018-07-25 NOTE — ED Triage Notes (Signed)
Pt from home; c/o L sided heaviness of the head, neck, and arm that began last night; endorses sob and dizziness, denies CP; pt also c/o itchiness to hands, arms, face, and head that began about an hour ago; NAD at this time

## 2018-07-25 NOTE — ED Provider Notes (Signed)
[facility] EMERGENCY DEPARTMENT Provider Note   CSN: [id number] Arrival date & time: 07/25/18  1644    History   Chief Complaint No chief complaint on file.   HPI Kristie Tapia is a 48 y.o. female with a history of prior CVA, type 2 diabetes, migraines presenting to emergency department today with chief complaint of stroke-like symptoms x 1 day.  Patient reports last night she started to feel numbness and tingling in the left side of her face.  Throughout the night it progressed down her arm and states that the numbness is now present until her elbow.  Patient also presents with a migraine.  She describes this migraine is similar to those in the past.  Denies sudden onset, migraine has progressively worsened over the last 2 days.  She does not take any medications for headache prior to arrival.  Admits to associated photophobia, nausea, and dizziness.  Patient states the pain located in her forehead and neck.  She describes the pain as throbbing and rate it 7/10 severity.   Denies fever, chills, shortness of breath, chest pain, recent falls.       Past Medical History:  Diagnosis Date  . Anemia   . Anxiety   . Complication of anesthesia ` 1998   "lungs collapsed during LEEP procedure"  . Gallstones   . GERD (gastroesophageal reflux disease)   . H/O hiatal hernia   . History of stomach ulcers   . Migraines   . Sleep apnea   . Stroke ([place])   . Type II diabetes mellitus [facility])     Patient Active Problem List   Diagnosis Date Noted  . Lower respiratory infection 07/06/2018  . Cough 07/06/2018  . Chronic musculoskeletal pain 07/06/2018  . CVA (cerebral vascular accident) ([place]) 12/27/2017  . Tobacco abuse 12/27/2017  . Obesity 12/27/2017  . Ischemic stroke ([place]) 12/27/2017  . Diabetic peripheral neuropathy ([place]) 12/27/2017  . Smoker unmotivated to quit 07/07/2017  . Chronic GERD 07/07/2017  . Chronic anxiety 07/07/2017  . Dyslipidemia 02/24/2017  .  Diabetes ([place]) 02/24/2017  . Headache 02/24/2017    Past Surgical History:  Procedure Laterality Date  . ABDOMINAL EXPLORATION SURGERY     "I've had several"  . ABDOMINAL HYSTERECTOMY  08/1998  . APPENDECTOMY  01/03/2012  . DILATION AND CURETTAGE OF UTERUS    . LAPAROSCOPIC APPENDECTOMY  01/03/2012   Procedure: APPENDECTOMY LAPAROSCOPIC;  Surgeon: Edward Jolly, MD;  Location: [facility];  Service: General;  Laterality: N/A;  . LEEP  1998   "lungs collapsed"  . TUBAL LIGATION  05/1998     OB History   No obstetric history on file.      Home Medications    Prior to Admission medications   Medication Sig Start Date End Date Taking? Authorizing Provider  acetaminophen (TYLENOL) 500 MG tablet Take 1,000 mg by mouth every 6 (six) hours as needed (for headaches).   Yes [provider]  albuterol (PROVENTIL HFA;VENTOLIN HFA) 108 (90 Base) MCG/ACT inhaler Inhale 2 puffs into the lungs every 6 (six) hours as needed for wheezing or shortness of breath. 02/26/18  Yes McVey, Gelene Mink, PA-C  clopidogrel (PLAVIX) 75 MG tablet TAKE 1 TABLET BY MOUTH ONCE DAILY Patient taking differently: Take 75 mg by mouth at bedtime.  04/23/18  Yes Stallings, Zoe A, MD  Continuous Blood Gluc Sensor (FREESTYLE LIBRE 14 DAY SENSOR) MISC USE AS DIRECTED FOR 14 DAYS Patient taking differently: 1 patch every 14 (fourteen) days.  07/21/18  Yes Philemon Kingdom, MD  escitalopram (LEXAPRO) 20 MG tablet TAKE 1 TABLET BY MOUTH ONCE DAILY Patient taking differently: Take 20 mg by mouth at bedtime.  07/22/18  Yes Wendie Agreste, MD  fluticasone [facility]) 50 MCG/ACT nasal spray Place 1-2 sprays into both nostrils daily as needed for allergies or rhinitis.    Yes [provider]  furosemide (LASIX) 20 MG tablet Take 20 mg by mouth daily as needed for fluid or edema (feet or legs).   Yes [provider]  gabapentin (NEURONTIN) 100 MG capsule Take 1-3 capsules (100-300 mg total) by mouth See  admin instructions. Take 1 capsule every morning then take 2 capsules every evening Patient taking differently: Take 200 mg by mouth See admin instructions. Take 200 mg by mouth two times a day- morning and evening 01/13/18  Yes Tereasa Coop, PA-C  glimepiride (AMARYL) 2 MG tablet Take 2 mg by mouth daily with breakfast.   Yes [provider]  Insulin Disposable Pump (V-GO 40) KIT 1 Device by Does not apply route daily. Patient taking differently: 1 Device See admin instructions. Uses with Novolog (capacity is 76 units/day) 11/14/17  Yes Renato Shin, MD  LORazepam (ATIVAN) 1 MG tablet TAKE 1 TABLET BY MOUTH TWICE DAILY AS NEEDED FOR ANXIETY ( FOR ACUTE PANIC ONLY. NO MORE THAN 60 TABS IN ONE YEAR FROM 07/06/17) Patient taking differently: Take 1 mg by mouth See admin instructions. Take 1 mg by mouth three to four times a day as needed for anxiety 01/14/18  Yes Tereasa Coop, PA-C  methocarbamol (ROBAXIN) 500 MG tablet Take 0.5-1 tablets (250-500 mg total) by mouth 2 (two) times daily as needed for muscle spasms. Patient taking differently: Take 500 mg by mouth See admin instructions. Take 500 mg by mouth at bedtime and an additional 500 mg once a day as needed for muscle spasms 07/05/18  Yes Sagardia, Ines Bloomer, MD  montelukast (SINGULAIR) 10 MG tablet TAKE 1 TABLET BY MOUTH AT BEDTIME Patient taking differently: Take 10 mg by mouth at bedtime.  07/11/18  Yes Rutherford Guys, MD  naproxen sodium (ALEVE) 220 MG tablet Take 220-440 mg by mouth 2 (two) times daily as needed (for headaches).   Yes [provider]  nitroGLYCERIN (NITROSTAT) 0.4 MG SL tablet Place 0.4 mg under the tongue every 5 (five) minutes as needed for chest pain.   Yes [provider]  NOVOLOG 100 UNIT/ML injection For use in pump, total of 80 units per day. Patient taking differently: See admin instructions. For use in V-Go pump, total of 76 units per day. 06/17/18  Yes Renato Shin, MD    OnabotulinumtoxinA (BOTOX IM) Inject into the forehead, temples, scalp, and head for migraines- every 3 months   Yes [provider]  pantoprazole (PROTONIX) 40 MG tablet Take 40 mg by mouth 2 (two) times daily.  08/12/17  Yes [provider]  promethazine (PHENERGAN) 25 MG tablet Take 25 mg by mouth every 6 (six) hours as needed for nausea.  08/05/17  Yes [provider]  rosuvastatin (CRESTOR) 40 MG tablet TAKE 1 TABLET BY MOUTH ONCE DAILY Patient taking differently: Take 40 mg by mouth at bedtime.  07/11/18  Yes Sagardia, Ines Bloomer, MD  saxagliptin HCl (ONGLYZA) 2.5 MG TABS tablet Take 1 tablet (2.5 mg total) by mouth daily. Patient taking differently: Take 2.5 mg by mouth at bedtime.  06/17/18  Yes Renato Shin, MD  traZODone (DESYREL) 100 MG tablet TAKE  1 TABLET BY MOUTH ONCE DAILY AT BEDTIME Patient taking differently: Take 100 mg by mouth at bedtime.  07/11/18  Yes Horald Pollen, MD  azithromycin [facility]) 250 MG tablet Sig as indicated Patient not taking: Reported on 07/25/2018 07/05/18   Horald Pollen, MD  benzonatate (TESSALON) 200 MG capsule Take 1 capsule (200 mg total) by mouth 2 (two) times daily as needed for cough. Patient not taking: Reported on 07/25/2018 07/05/18   Horald Pollen, MD  insulin regular (NOVOLIN R) 100 units/mL injection For use in pump, total of 76 units/day Patient not taking: Reported on 07/25/2018 05/29/18   Renato Shin, MD  ipratropium (ATROVENT HFA) 17 MCG/ACT inhaler Inhale 2 puffs into the lungs 2 (two) times daily for 7 days. Patient not taking: Reported on 07/25/2018 07/05/18 07/25/18  Horald Pollen, MD  metoCLOPramide (REGLAN) 10 MG tablet Take 1 tablet (10 mg total) by mouth every 6 (six) hours as needed for nausea. Patient not taking: Reported on 07/05/2018 06/12/17   [name], DO  nicotine polacrilex (NICORETTE) 4 MG gum Take 1 each (4 mg total) by mouth as needed for smoking cessation. Patient not  taking: Reported on 07/25/2018 01/01/18   Tereasa Coop, PA-C  topiramate ER (QUDEXY XR) CS24 sprinkle capsule Take 1 capsule (100 mg total) by mouth at bedtime. Patient not taking: Reported on 07/05/2018 12/19/17   [name], DO    Family History Family History  Problem Relation Age of Onset  . Breast cancer Other   . Diabetes Paternal Grandfather     Social History Social History   Tobacco Use  . Smoking status: Current Every Day Smoker    Packs/day: 1.00    Years: 29.00    Pack years: 29.00    Types: Cigarettes    Last attempt to quit: 03/05/2017    Years since quitting: 1.3  . Smokeless tobacco: Never Used  . Tobacco comment: has smoked since age 56  Substance Use Topics  . Alcohol use: No    Comment: hasn't  had s drink in over 5 yrs  . Drug use: No     Allergies   Sulfa antibiotics; Tape; Decongestant [pseudoephedrine hcl er]; Other; Prednisone; Chlorpheniramine; Eggs or egg-derived products; Percocet [oxycodone-acetaminophen]; Red dye; Latex; and Zoloft [sertraline hcl]   Review of Systems Review of Systems  HENT: Positive for sinus pain.   Eyes: Positive for photophobia.  Respiratory: Positive for shortness of breath.   Cardiovascular: Negative for chest pain.  Gastrointestinal: Positive for nausea. Negative for abdominal pain, diarrhea and vomiting.  Skin: Negative for wound.  Neurological: Positive for dizziness, light-headedness, numbness and headaches. Negative for facial asymmetry and speech difficulty.     Physical Exam Updated Vital Signs BP 129/64   Pulse 68   Temp 98 F (36.7 C) (Oral)   Resp 18   Ht 5' 5"  (1.651 m)   Wt 88.5 kg   SpO2 96%   BMI 32.45 kg/m   Physical Exam Vitals signs and nursing note reviewed.  Constitutional:      Appearance: She is not ill-appearing or toxic-appearing.  HENT:     Head: Normocephalic and atraumatic.     Comments: Non tender to palpation over bilateral temples    Right Ear: Tympanic membrane  normal.     Left Ear: Tympanic membrane normal.     Nose: Nose normal.     Mouth/Throat:     Mouth: Mucous membranes are moist.  Pharynx: Oropharynx is clear.  Eyes:     General: No scleral icterus.    Extraocular Movements: Extraocular movements intact.     Conjunctiva/sclera: Conjunctivae normal.     Pupils: Pupils are equal, round, and reactive to light.  Neck:     Musculoskeletal: Normal range of motion. No muscular tenderness.  Cardiovascular:     Rate and Rhythm: Normal rate and regular rhythm.     Pulses: Normal pulses.     Heart sounds: Normal heart sounds.  Pulmonary:     Effort: Pulmonary effort is normal.     Breath sounds: Normal breath sounds.  Abdominal:     General: There is no distension.     Palpations: Abdomen is soft.     Tenderness: There is no abdominal tenderness. There is no guarding or rebound.  Musculoskeletal: Normal range of motion.     Right lower leg: No edema.     Left lower leg: No edema.  Skin:    General: Skin is warm and dry.     Capillary Refill: Capillary refill takes less than 2 seconds.  Neurological:     Mental Status: She is alert. Mental status is at baseline.     Comments: Mental Status:  Alert, oriented, thought content appropriate, able to give a coherent history. Speech fluent without evidence of aphasia. Able to follow 2 step commands without difficulty.  Cranial Nerves:  II:  Peripheral visual fields grossly normal, pupils equal, round, reactive to light III,IV, VI: ptosis not present, extra-ocular motions intact bilaterally  V,VII: smile symmetric, facial light touch sensation equal VIII: hearing grossly normal to voice  X: uvula elevates symmetrically  XI: bilateral shoulder shrug symmetric and strong XII: midline tongue extension without fassiculations Motor:  Normal tone. 5/5 in upper and lower extremities bilaterally including strong and equal grip strength and dorsiflexion/plantar flexion Sensory: Pinprick and light  touch normal in all extremities.  Deep Tendon Reflexes: 2+ and symmetric in the biceps and patella Cerebellar: normal finger-to-nose with bilateral upper extremities Gait: normal gait and balance CV: distal pulses palpable throughout     Psychiatric:        Behavior: Behavior normal.      ED Treatments / Results  Labs (all labs ordered are listed, but only abnormal results are displayed) Labs Reviewed  COMPREHENSIVE METABOLIC PANEL - Abnormal; Notable for the following components:      Result Value   Potassium 3.4 (*)    Glucose, Bld 127 (*)    BUN 5 (*)    Calcium 8.5 (*)    All other components within normal limits  CBC WITH DIFFERENTIAL/PLATELET - Abnormal; Notable for the following components:   WBC 10.9 (*)    RBC 6.32 (*)    Hemoglobin 17.3 (*)    HCT 52.2 (*)    RDW 16.3 (*)    Abs Immature Granulocytes 0.13 (*)    All other components within normal limits  URINALYSIS, ROUTINE W REFLEX MICROSCOPIC - Abnormal; Notable for the following components:   Color, Urine STRAW (*)    Specific Gravity, Urine 1.004 (*)    All other components within normal limits    EKG EKG Interpretation  Date/Time:  Friday July 25 2018 17:47:56 EST Ventricular Rate:  75 PR Interval:    QRS Duration: 78 QT Interval:  427 QTC Calculation: 477 R Axis:   29 Text Interpretation:  Sinus rhythm Probable anteroseptal infarct, old No significant change since last tracing Confirmed by [name] [phone] on  07/25/2018 6:26:38 PM   Radiology Ct Head Wo Contrast  Result Date: 07/25/2018 CLINICAL DATA:  Left-sided heaviness.  Dizziness. EXAM: CT HEAD WITHOUT CONTRAST TECHNIQUE: Contiguous axial images were obtained from the base of the skull through the vertex without intravenous contrast. COMPARISON:  12/28/2017 FINDINGS: Brain: There is no evidence for acute hemorrhage, hydrocephalus, mass lesion, or abnormal extra-axial fluid collection. No definite CT evidence for acute infarction.  Chronic small infarcts in the posterior right frontal and right parietal regions are stable. Vascular: No hyperdense vessel or unexpected calcification. Skull: No evidence for fracture. No worrisome lytic or sclerotic lesion. Sinuses/Orbits: The visualized paranasal sinuses and mastoid air cells are clear. Visualized portions of the globes and intraorbital fat are unremarkable. Other: None. IMPRESSION: Stable.  No acute intracranial abnormality. Electronically Signed   By: Misty Stanley M.D.   On: 07/25/2018 18:58    Procedures Procedures (including critical care time)  Medications Ordered in ED Medications  ketorolac (TORADOL) 15 MG/ML injection 15 mg (has no administration in time range)  valproate (DEPACON) injection 500 mg (has no administration in time range)  prochlorperazine (COMPAZINE) injection 10 mg (has no administration in time range)  diphenhydrAMINE (BENADRYL) injection 25 mg (25 mg Intravenous Given 07/25/18 1851)  promethazine (PHENERGAN) injection 12.5 mg (12.5 mg Intravenous Given 07/25/18 1854)  sodium chloride 0.9 % bolus 1,000 mL (0 mLs Intravenous Stopped 07/25/18 1954)     Initial Impression / Assessment and Plan / ED Course  I have reviewed the triage vital signs and the nursing notes.  Pertinent labs & imaging results that were available during my care of the patient were reviewed by me and considered in my medical decision making (see chart for details).    Patient is afebrile, well-appearing.  She is presenting with left-sided facial numbness and headache. Pt HA treated and improved while in ED with IV benadryl and phenergan.  The left-sided facial numbness has also improved. Presentation is like pts typical HA and non concerning for [facility], ICH, Meningitis, or temporal arteritis. Pt is afebrile with no focal neuro deficits except left sided facial numbness. She denies nuchal rigidity, or change in vision.  Will CT head given his new complaint of facial numbness, and  evaluate labs with CBC, CMP, UA.  I viewed her EKG and it is without ischemic changes, is unchanged from prior.    Labs today show CBC with a mildly elevated leukocytosis of 10.9 without signs or symptoms of infection.  UA and CMP unremarkable. She has an appointment already scheduled with her neurologist in 1 week. I recommend she keep that appointment and also follow up with pcp. Pt verbalizes understanding and is agreeable with plan to dc.    The patient was discussed with and seen by Dr. Ellender Hose who agrees with the treatment plan.        Final Clinical Impressions(s) / ED Diagnoses   Final diagnoses:  Chronic nonintractable headache, unspecified headache type    ED Discharge Orders    None       Flint Melter 07/25/18 2145    [name], MD 07/26/18 [phone]

## 2018-07-25 NOTE — ED Notes (Signed)
Attempted x 2 for IV restart with unsuccessful d/t infiltration

## 2018-07-25 NOTE — Telephone Encounter (Signed)
Pt called in c/o being dizzy with shortness of breath over the last week.   She has multiple medical problems she has: Fibromyalgia, stroke, vascular dementia and diabetes.  She said her sugar was fine so it was not from that.  See triage notes for details.  I have referred her to the ED due to the facial tingling and heavy feeling in the whole left side of her head.   With her history of a stroke back in July and her symptoms I referred her to the ED.   She wants to call her friend to take her to [facility].   I instructed her to call 911 right away if her friend was not able to take her.   Time was important if she was experiencing another stroke.   She agreed to call 911 if her friend could not take her.    Reason for Disposition . [1] Numbness (i.e., loss of sensation) of the face, arm or leg on one side of the body AND [2] sudden onset AND [3] present now  Answer Assessment - Initial Assessment Questions 1. DESCRIPTION: "Describe your dizziness."     I'm diabetic and I smoke.   I have fibromyalgia and had a stroke. Over the last week I've started feeling dizzy when I lean over or bend down and get up.   I get short of breath too.   I have a bad headache.   I get migraines.   It feels more like a sinus headache. 2. LIGHTHEADED: "Do you feel lightheaded?" (e.g., somewhat faint, woozy, weak upon standing)     See above.     Sometimes when I blow my nose on the left side I get some blood.   The left side of my head hurts worse.    My stroke was on the left side that affected my right arm.   The stroke was in July 2019. 3. VERTIGO: "Do you feel like either you or the room is spinning or tilting?" (i.e. vertigo)     I feel dizzy with movements mentioned above. 4. SEVERITY: "How bad is it?"  "Do you feel like you are going to faint?" "Can you stand and walk?"   - MILD - walking normally   - MODERATE - interferes with normal activities (e.g., work, school)    - SEVERE - unable to stand,  requires support to walk, feels like passing out now.      I get nauseas with the dizziness.   I do hold onto things to help me with my balance over the last week.  5. ONSET:  "When did the dizziness begin?"     About a week ago. 6. AGGRAVATING FACTORS: "Does anything make it worse?" (e.g., standing, change in head position)     Movement, leaning over getting up suddenly I have to grab onto something for my balance. 7. HEART RATE: "Can you tell me your heart rate?" "How many beats in 15 seconds?"  (Note: not all patients can do this)       Not asked 8. CAUSE: "What do you think is causing the dizziness?"     I don't know.   I feel like the left side of my head is heavier and neck too.   9. RECURRENT SYMPTOM: "Have you had dizziness before?" If so, ask: "When was the last time?" "What happened that time?"     "I don't know".     10. OTHER SYMPTOMS: "Do you have any  other symptoms?" (e.g., fever, chest pain, vomiting, diarrhea, bleeding)       Nausea when dizzy.  Whole left side of my head feels heavy and my left side of my face is tingling.   I feel a pressure in my neck and top of shoulder.   11. PREGNANCY: "Is there any chance you are pregnant?" "When was your last menstrual period?"       Hysterectomy  Protocols used: DIZZINESS [facility]

## 2018-07-26 ENCOUNTER — Ambulatory Visit: Payer: BLUE CROSS/BLUE SHIELD | Admitting: Family Medicine

## 2018-07-28 ENCOUNTER — Other Ambulatory Visit: Payer: Self-pay | Admitting: Endocrinology

## 2018-08-07 ENCOUNTER — Other Ambulatory Visit: Payer: Self-pay | Admitting: Endocrinology

## 2018-08-07 ENCOUNTER — Other Ambulatory Visit: Payer: Self-pay | Admitting: Family Medicine

## 2018-08-07 ENCOUNTER — Other Ambulatory Visit: Payer: Self-pay | Admitting: Emergency Medicine

## 2018-08-07 DIAGNOSIS — F419 Anxiety disorder, unspecified: Secondary | ICD-10-CM

## 2018-08-08 NOTE — Telephone Encounter (Signed)
Requested Prescriptions  Pending Prescriptions Disp Refills  . traZODone (DESYREL) 100 MG tablet [Pharmacy Med Name: traZODone HCl 100 MG Oral Tablet] 30 tablet 0    Sig: Take 1 tablet (100 mg total) by mouth at bedtime.     Psychiatry: Antidepressants - Serotonin Modulator Passed - 08/07/2018  7:44 PM      Passed - Valid encounter within last 6 months    Recent Outpatient Visits          1 month ago Lower respiratory infection   Primary Care at [facility], [place], MD   3 months ago Limitation due to disability   Primary Care at [facility], Madelaine Bhat, PA-C   5 months ago Acute non-recurrent frontal sinusitis   [facility], Meda Coffee, MD   5 months ago Chronic left-sided thoracic back pain   Primary Care at [facility], Madelaine Bhat, PA-C   6 months ago Uncontrolled type 2 diabetes mellitus with hyperglycemia [facility])   Primary Care at [facility], Marolyn Hammock, PA-C

## 2018-08-08 NOTE — Telephone Encounter (Signed)
Pt with f/u appt 08/28/18 Requested Prescriptions  Pending Prescriptions Disp Refills  . escitalopram (LEXAPRO) 20 MG tablet [Pharmacy Med Name: Escitalopram Oxalate 20 MG Oral Tablet] 18 tablet 0    Sig: TAKE 1 TABLET BY MOUTH ONCE DAILY. PT NEEDS AN OFFICE VISIT     Psychiatry:  Antidepressants - SSRI Passed - 08/08/2018  1:03 PM      Passed - Valid encounter within last 6 months    Recent Outpatient Visits          1 month ago Lower respiratory infection   Primary Care at [facility], [place], MD   3 months ago Limitation due to disability   Primary Care at [facility], Madelaine Bhat, PA-C   5 months ago Acute non-recurrent frontal sinusitis   [facility] at [facility], Meda Coffee, MD   5 months ago Chronic left-sided thoracic back pain   Primary Care at [facility], Madelaine Bhat, PA-C   6 months ago Uncontrolled type 2 diabetes mellitus with hyperglycemia [facility])   [facility] at [facility], Marolyn Hammock, PA-C      Future Appointments            In 2 weeks Myles Lipps, MD Primary Care at [place]

## 2018-08-08 NOTE — Telephone Encounter (Signed)
Pt stated she needed to schedule OV for future refills. OV scheduled for 08/28/18. Pt will run out of medication in 18 days. Would like to know if rx can be sent in to hold her over until OV. Pt takes once a day. Please advise.   [facility] - [place], [place] - [address] [place] [phone] (Phone) [phone] (Fax)

## 2018-08-08 NOTE — Telephone Encounter (Signed)
Requested Prescriptions  Pending Prescriptions Disp Refills  . montelukast (SINGULAIR) 10 MG tablet [Pharmacy Med Name: Montelukast Sodium 10 MG Oral Tablet] 30 tablet 0    Sig: TAKE 1 TABLET BY MOUTH AT BEDTIME     Pulmonology:  Leukotriene Inhibitors Passed - 08/07/2018  7:44 PM      Passed - Valid encounter within last 12 months    Recent Outpatient Visits          1 month ago Lower respiratory infection   Primary Care at [facility], [place], MD   3 months ago Limitation due to disability   Primary Care at [facility], [place], PA-C   5 months ago Acute non-recurrent frontal sinusitis   [facility], Meda Coffee, MD   5 months ago Chronic left-sided thoracic back pain   Primary Care at [facility], Madelaine Bhat, PA-C   6 months ago Uncontrolled type 2 diabetes mellitus with hyperglycemia [facility])   Primary Care at [facility], Marolyn Hammock, PA-C

## 2018-08-08 NOTE — Telephone Encounter (Signed)
Requested medication (s) are due for refill today: yes  Requested medication (s) are on the active medication list: yes  Last refill:  07/22/18 #30 No RF   Future visit scheduled: no  Notes to clinic:  Note on prescription request "Needs office visit" Called pt and left message to call back and make appt. Last refill was a 30 day refill. Routing back to provider to review.    Requested Prescriptions  Pending Prescriptions Disp Refills   escitalopram (LEXAPRO) 20 MG tablet [Pharmacy Med Name: Escitalopram Oxalate 20 MG Oral Tablet] 30 tablet 0    Sig: TAKE 1 TABLET BY MOUTH ONCE DAILY. PT NEEDS AN OFFICE VISIT     Psychiatry:  Antidepressants - SSRI Passed - 08/07/2018  7:44 PM      Passed - Valid encounter within last 6 months    Recent Outpatient Visits          1 month ago Lower respiratory infection   Primary Care at [facility], [place], [place]   3 months ago Limitation due to disability   Primary Care at [facility], Madelaine Bhat, PA-C   5 months ago Acute non-recurrent frontal sinusitis   [facility] at [facility], Meda Coffee, MD   5 months ago Chronic left-sided thoracic back pain   Primary Care at [facility], Madelaine Bhat, PA-C   6 months ago Uncontrolled type 2 diabetes mellitus with hyperglycemia [facility])   Primary Care at [facility], Marolyn Hammock, PA-C

## 2018-08-18 ENCOUNTER — Other Ambulatory Visit: Payer: Self-pay | Admitting: Internal Medicine

## 2018-08-19 ENCOUNTER — Encounter: Payer: Self-pay | Admitting: Endocrinology

## 2018-08-19 ENCOUNTER — Ambulatory Visit (INDEPENDENT_AMBULATORY_CARE_PROVIDER_SITE_OTHER): Payer: BLUE CROSS/BLUE SHIELD | Admitting: Endocrinology

## 2018-08-19 ENCOUNTER — Other Ambulatory Visit: Payer: Self-pay

## 2018-08-19 VITALS — BP 144/82 | HR 75 | Temp 98.1°F | Ht 65.0 in | Wt 200.2 lb

## 2018-08-19 DIAGNOSIS — Z794 Long term (current) use of insulin: Secondary | ICD-10-CM

## 2018-08-19 DIAGNOSIS — E1142 Type 2 diabetes mellitus with diabetic polyneuropathy: Secondary | ICD-10-CM | POA: Diagnosis not present

## 2018-08-19 LAB — POCT GLYCOSYLATED HEMOGLOBIN (HGB A1C): Hemoglobin A1C: 8.7 % — AB (ref 4.0–5.6)

## 2018-08-19 MED ORDER — INSULIN LISPRO 200 UNIT/ML ~~LOC~~ SOPN: 150.0000 [IU] | PEN_INJECTOR | Freq: Every day | SUBCUTANEOUS | 11 refills | Status: DC

## 2018-08-19 NOTE — Patient Instructions (Addendum)
I have sent a prescription to your pharmacy, to start humalog U-200.  Please start with no clicks, and: Stop taking the glimepiride.   Please come back for a follow-up appointment in 1 week.   check your blood sugar twice a day.  vary the time of day when you check, between before the 3 meals, and at bedtime.  also check if you have symptoms of your blood sugar being too high or too low.  please keep a record of the readings and bring it to your next appointment here (or you can bring the meter itself).  You can write it on any piece of paper.  please call us sooner if your blood sugar goes below 70, or if you have a lot of readings over 200.

## 2018-08-19 NOTE — Progress Notes (Signed)
Subjective:    Patient ID: Kristie Tapia, female    DOB: 19-Nov-1970, 48 y.o.   MRN: [id number]  HPI Pt returns for f/u of diabetes mellitus:  DM type: Insulin-requiring type 2.  Dx'ed: 1998.  Complications: polyneuropathy and CVA.   Therapy: insulin since 2001, and V-GO-40 since 2019 (also 2 oral meds).   GDM: never.  DKA: never.  Severe hypoglycemia: never Pancreatitis: never Pancreatic imaging: normal on 2017 CT Other: she has seen [facility] for nausea, which limits rx options; she stopped farxiga, due to yeast infection.   Interval history: Pt says she takes the max number of clicks per day.  continuous glucose monitor data are reviewed.  Glucose varies from 100-270.  It is in general highest after eating.  Past Medical History:  Diagnosis Date  . Anemia   . Anxiety   . Complication of anesthesia ` 1998   "lungs collapsed during LEEP procedure"  . Gallstones   . GERD (gastroesophageal reflux disease)   . H/O hiatal hernia   . History of stomach ulcers   . Migraines   . Sleep apnea   . Stroke ([place])   . Type II diabetes mellitus ([place])     Past Surgical History:  Procedure Laterality Date  . ABDOMINAL EXPLORATION SURGERY     "I've had several"  . ABDOMINAL HYSTERECTOMY  08/1998  . APPENDECTOMY  01/03/2012  . DILATION AND CURETTAGE OF UTERUS    . LAPAROSCOPIC APPENDECTOMY  01/03/2012   Procedure: APPENDECTOMY LAPAROSCOPIC;  Surgeon: Edward Jolly, MD;  Location: [facility];  Service: General;  Laterality: N/A;  . LEEP  1998   "lungs collapsed"  . TUBAL LIGATION  05/1998    Social History   Socioeconomic History  . Marital status: Married    Spouse name: Not on file  . Number of children: 3  . Years of education: Not on file  . Highest education level: Not on file  Occupational History  . Not on file  Social Needs  . Financial resource strain: Not on file  . Food insecurity:    Worry: Not on file    Inability: Not on file  . Transportation needs:   Medical: Not on file    Non-medical: Not on file  Tobacco Use  . Smoking status: Current Every Day Smoker    Packs/day: 1.00    Years: 29.00    Pack years: 29.00    Types: Cigarettes    Last attempt to quit: 03/05/2017    Years since quitting: 1.4  . Smokeless tobacco: Never Used  . Tobacco comment: has smoked since age 31  Substance and Sexual Activity  . Alcohol use: No    Comment: hasn't  had s drink in over 5 yrs  . Drug use: No  . Sexual activity: Yes  Lifestyle  . Physical activity:    Days per week: Not on file    Minutes per session: Not on file  . Stress: Not on file  Relationships  . Social connections:    Talks on phone: Not on file    Gets together: Not on file    Attends religious service: Not on file    Active member of club or organization: Not on file    Attends meetings of clubs or organizations: Not on file    Relationship status: Not on file  . Intimate partner violence:    Fear of current or ex partner: Not on file    Emotionally abused: Not  on file    Physically abused: Not on file    Forced sexual activity: Not on file  Other Topics Concern  . Not on file  Social History Narrative  . Not on file    Current Outpatient Medications on File Prior to Visit  Medication Sig Dispense Refill  . acetaminophen (TYLENOL) 500 MG tablet Take 1,000 mg by mouth every 6 (six) hours as needed (for headaches).    Marland Kitchen albuterol (PROVENTIL HFA;VENTOLIN HFA) 108 (90 Base) MCG/ACT inhaler Inhale 2 puffs into the lungs every 6 (six) hours as needed for wheezing or shortness of breath. 1 Inhaler 4  . clopidogrel (PLAVIX) 75 MG tablet TAKE 1 TABLET BY MOUTH ONCE DAILY (Patient taking differently: Take 75 mg by mouth at bedtime. ) 90 tablet 1  . Continuous Blood Gluc Sensor (FREESTYLE LIBRE 14 DAY SENSOR) MISC USE AS DIRECTED FOR 14 DAYS 2 each 0  . escitalopram (LEXAPRO) 20 MG tablet TAKE 1 TABLET BY MOUTH ONCE DAILY. PT NEEDS AN OFFICE VISIT 18 tablet 0  . fluticasone  (FLONASE) 50 MCG/ACT nasal spray Place 1-2 sprays into both nostrils daily as needed for allergies or rhinitis.     . furosemide (LASIX) 20 MG tablet Take 20 mg by mouth daily as needed for fluid or edema (feet or legs).    . gabapentin (NEURONTIN) 100 MG capsule Take 1-3 capsules (100-300 mg total) by mouth See admin instructions. Take 1 capsule every morning then take 2 capsules every evening (Patient taking differently: Take 200 mg by mouth See admin instructions. Take 200 mg by mouth two times a day- morning and evening) 180 capsule 3  . Insulin Disposable Pump (V-GO 40) KIT 1 Device by Does not apply route daily. (Patient taking differently: 1 Device See admin instructions. Uses with Novolog (capacity is 76 units/day)) 30 kit 11  . LORazepam (ATIVAN) 1 MG tablet TAKE 1 TABLET BY MOUTH TWICE DAILY AS NEEDED FOR ANXIETY ( FOR ACUTE PANIC ONLY. NO MORE THAN 60 TABS IN ONE YEAR FROM 07/06/17) (Patient taking differently: Take 1 mg by mouth See admin instructions. Take 1 mg by mouth three to four times a day as needed for anxiety) 20 tablet 2  . methocarbamol (ROBAXIN) 500 MG tablet Take 0.5-1 tablets (250-500 mg total) by mouth 2 (two) times daily as needed for muscle spasms. (Patient taking differently: Take 500 mg by mouth See admin instructions. Take 500 mg by mouth at bedtime and an additional 500 mg once a day as needed for muscle spasms) 60 tablet 1  . metoCLOPramide (REGLAN) 10 MG tablet Take 1 tablet (10 mg total) by mouth every 6 (six) hours as needed for nausea. 25 tablet 2  . montelukast (SINGULAIR) 10 MG tablet TAKE 1 TABLET BY MOUTH AT BEDTIME 30 tablet 0  . naproxen sodium (ALEVE) 220 MG tablet Take 220-440 mg by mouth 2 (two) times daily as needed (for headaches).    . nicotine polacrilex (NICORETTE) 4 MG gum Take 1 each (4 mg total) by mouth as needed for smoking cessation. 100 tablet 1  . nitroGLYCERIN (NITROSTAT) 0.4 MG SL tablet Place 0.4 mg under the tongue every 5 (five) minutes as  needed for chest pain.    . OnabotulinumtoxinA (BOTOX IM) Inject into the forehead, temples, scalp, and head for migraines- every 3 months    . pantoprazole (PROTONIX) 40 MG tablet Take 40 mg by mouth 2 (two) times daily.   1  . promethazine (PHENERGAN) 25 MG tablet Take 25  mg by mouth every 6 (six) hours as needed for nausea.   0  . rosuvastatin (CRESTOR) 40 MG tablet TAKE 1 TABLET BY MOUTH ONCE DAILY (Patient taking differently: Take 40 mg by mouth at bedtime. ) 90 tablet 0  . saxagliptin HCl (ONGLYZA) 2.5 MG TABS tablet Take 1 tablet (2.5 mg total) by mouth daily. (Patient taking differently: Take 2.5 mg by mouth at bedtime. ) 30 tablet 11  . topiramate ER (QUDEXY XR) CS24 sprinkle capsule Take 1 capsule (100 mg total) by mouth at bedtime. 30 each 3  . traZODone (DESYREL) 100 MG tablet Take 1 tablet (100 mg total) by mouth at bedtime. 30 tablet 0  . ipratropium (ATROVENT HFA) 17 MCG/ACT inhaler Inhale 2 puffs into the lungs 2 (two) times daily for 7 days. (Patient not taking: Reported on 07/25/2018) 1 Inhaler 12   No current facility-administered medications on file prior to visit.     Allergies  Allergen Reactions  . Sulfa Antibiotics Other (See Comments)    Burns the inside of my mouth; "blisters; leaves tongue and inside of mouth solid red"  . Tape Other (See Comments)    Blisters- USE PAPER TAPE ONLY  . Decongestant [Pseudoephedrine Hcl Er] Itching    ALL DECONGESTANTS  . Other Itching    ALL DECONGESTANTS  . Prednisone Other (See Comments)    "Gives me a bad attitude"  . Chlorpheniramine Itching  . Eggs Or Egg-Derived Products Nausea And Vomiting  . Percocet [Oxycodone-Acetaminophen] Other (See Comments)    PT reports having nightmares when taking Percocet  . Red Dye Other (See Comments)    CAUSES MIGRAINES  . Latex Itching  . Zoloft [Sertraline Hcl] Rash    Family History  Problem Relation Age of Onset  . Breast cancer Other   . Diabetes Paternal Grandfather     BP  (!) 144/82 (BP Location: Right Arm, Patient Position: Sitting, Cuff Size: Large)   Pulse 75   Temp 98.1 F (36.7 C) (Oral)   Ht 5' 5"  (1.651 m)   Wt 200 lb 3.2 oz (90.8 kg)   SpO2 96%   BMI 33.32 kg/m    Review of Systems She denies hypoglycemia.  No change in chronic nausea.      Objective:   Physical Exam VITAL SIGNS:  See vs page GENERAL: no distress Pulses: dorsalis pedis intact bilat.   MSK: no deformity of the feet CV: no leg edema.   Skin:  no ulcer on the feet.  normal color and temp on the feet.   Neuro: sensation is intact to touch on the feet.   Ext: There is bilateral onychomycosis of the toenails.  Both great toenails are ingrown, but no erythema/swell/drainage.    Lab Results  Component Value Date   HGBA1C 8.7 (A) 08/19/2018   Lab Results  Component Value Date   CREATININE 0.76 07/25/2018   BUN 5 (L) 07/25/2018   NA 135 07/25/2018   K 3.4 (L) 07/25/2018   CL 99 07/25/2018   CO2 23 07/25/2018       Assessment & Plan:  Insulin-requiring type 2 DM, with CVA: worse.  She has maximized dosage of her V-GO.  Nausea: This limits rx options   Patient Instructions  I have sent a prescription to your pharmacy, to start humalog U-200.  Please start with no clicks, and: Stop taking the glimepiride.   Please come back for a follow-up appointment in 1 week.   check your blood sugar twice a  day.  vary the time of day when you check, between before the 3 meals, and at bedtime.  also check if you have symptoms of your blood sugar being too high or too low.  please keep a record of the readings and bring it to your next appointment here (or you can bring the meter itself).  You can write it on any piece of paper.  please call us sooner if your blood sugar goes below 70, or if you have a lot of readings over 200.

## 2018-08-21 ENCOUNTER — Other Ambulatory Visit: Payer: Self-pay

## 2018-08-21 ENCOUNTER — Telehealth: Payer: Self-pay

## 2018-08-21 ENCOUNTER — Telehealth: Payer: Self-pay | Admitting: Endocrinology

## 2018-08-21 DIAGNOSIS — Z794 Long term (current) use of insulin: Principal | ICD-10-CM

## 2018-08-21 DIAGNOSIS — E1142 Type 2 diabetes mellitus with diabetic polyneuropathy: Secondary | ICD-10-CM

## 2018-08-21 MED ORDER — INSULIN LISPRO 200 UNIT/ML ~~LOC~~ SOPN: 150.0000 [IU] | PEN_INJECTOR | Freq: Every day | SUBCUTANEOUS | 11 refills | Status: DC

## 2018-08-21 NOTE — Telephone Encounter (Signed)
Rx has been resent. Originally sent as NO PRINT.  Insulin Lispro (HUMALOG KWIKPEN) 200 UNIT/ML SOPN 20 pen 11 08/21/2018    Sig - Route: Inject 150 Units into the skin daily. For use in pump, total of 150 units per day - Subcutaneous   Sent to pharmacy as: Insulin Lispro (HUMALOG KWIKPEN) 200 UNIT/ML Solution Pen-injector   E-Prescribing Status: Receipt confirmed by pharmacy (08/21/2018 12:55 PM EDT)

## 2018-08-21 NOTE — Telephone Encounter (Signed)
Insulin Lispro (HUMALOG KWIKPEN) 200 UNIT/ML SOPN   Patient stated that this prescription was never sent into the pharmacy and would like it resent    [id number] - [place], [place] - [address]

## 2018-08-21 NOTE — Telephone Encounter (Signed)
Patient stated that she believe this may require a PA .

## 2018-08-21 NOTE — Telephone Encounter (Signed)
PA initiated today through Cover My Meds for Humalog Kwikpen. Will await insurance response re: approval/denial.  Your information has been submitted to [facility] [place]. [employer] [place] will review the request and fax you a determination directly, typically within 3 business days of your submission once all necessary information is received. If [employer] [place] has not responded in 3 business days or if you have any questions about your submission, contact [employer] [place] at [phone].  Shaasia Mau  [employer] Rx #: [id number] Need help? Call us at [phone]  Status: Sent to Plan today  Drug: HumaLOG KwikPen 200UNIT/ML pen-injectors  Form: Consulting civil engineer Form (CB)  Original Claim Info70 PRODUCT OR SERVICE NOT COVERED. PLEASECONTACT PRESCRIBER

## 2018-08-22 ENCOUNTER — Telehealth: Payer: Self-pay | Admitting: Endocrinology

## 2018-08-22 DIAGNOSIS — Z794 Long term (current) use of insulin: Principal | ICD-10-CM

## 2018-08-22 DIAGNOSIS — E1142 Type 2 diabetes mellitus with diabetic polyneuropathy: Secondary | ICD-10-CM

## 2018-08-22 NOTE — Telephone Encounter (Signed)
Patient would like a call back regarding her insulin.  Her insurance is denying insurance and she needs to provide additional information.  Her call back number is [phone]

## 2018-08-22 NOTE — Telephone Encounter (Signed)
Denial letter received. Dr. Everardo All is reviewing letter and requesting an appeal. Once letter has been returned to me, I will call the # provided to proceed with appeal process. Will update pt once we have completed this process.

## 2018-08-26 ENCOUNTER — Other Ambulatory Visit: Payer: Self-pay | Admitting: Endocrinology

## 2018-08-26 ENCOUNTER — Other Ambulatory Visit: Payer: Self-pay | Admitting: Emergency Medicine

## 2018-08-26 DIAGNOSIS — F419 Anxiety disorder, unspecified: Secondary | ICD-10-CM

## 2018-08-26 NOTE — Telephone Encounter (Signed)
Can pt have a refill on Trazodone

## 2018-08-28 ENCOUNTER — Ambulatory Visit: Payer: BLUE CROSS/BLUE SHIELD | Admitting: Endocrinology

## 2018-08-28 ENCOUNTER — Other Ambulatory Visit: Payer: Self-pay

## 2018-08-28 ENCOUNTER — Telehealth (INDEPENDENT_AMBULATORY_CARE_PROVIDER_SITE_OTHER): Payer: BLUE CROSS/BLUE SHIELD | Admitting: Family Medicine

## 2018-08-28 DIAGNOSIS — M545 Low back pain: Secondary | ICD-10-CM

## 2018-08-28 DIAGNOSIS — E785 Hyperlipidemia, unspecified: Secondary | ICD-10-CM

## 2018-08-28 DIAGNOSIS — K219 Gastro-esophageal reflux disease without esophagitis: Secondary | ICD-10-CM

## 2018-08-28 DIAGNOSIS — E1142 Type 2 diabetes mellitus with diabetic polyneuropathy: Secondary | ICD-10-CM

## 2018-08-28 DIAGNOSIS — F419 Anxiety disorder, unspecified: Secondary | ICD-10-CM

## 2018-08-28 DIAGNOSIS — M797 Fibromyalgia: Secondary | ICD-10-CM

## 2018-08-28 DIAGNOSIS — G8929 Other chronic pain: Secondary | ICD-10-CM

## 2018-08-28 DIAGNOSIS — F17200 Nicotine dependence, unspecified, uncomplicated: Secondary | ICD-10-CM

## 2018-08-28 DIAGNOSIS — I69319 Unspecified symptoms and signs involving cognitive functions following cerebral infarction: Secondary | ICD-10-CM

## 2018-08-28 MED ORDER — FUROSEMIDE 20 MG PO TABS: 20.0000 mg | ORAL_TABLET | Freq: Every day | ORAL | 1 refills | Status: DC | PRN

## 2018-08-28 MED ORDER — ROSUVASTATIN CALCIUM 40 MG PO TABS: 40.0000 mg | ORAL_TABLET | Freq: Every day | ORAL | 3 refills | Status: DC

## 2018-08-28 MED ORDER — ESCITALOPRAM OXALATE 20 MG PO TABS: ORAL_TABLET | ORAL | 1 refills | Status: DC

## 2018-08-28 MED ORDER — LORAZEPAM 1 MG PO TABS: ORAL_TABLET | ORAL | 2 refills | Status: DC

## 2018-08-28 MED ORDER — TRAZODONE HCL 100 MG PO TABS: 100.0000 mg | ORAL_TABLET | Freq: Every day | ORAL | 1 refills | Status: DC

## 2018-08-28 MED ORDER — METHOCARBAMOL 500 MG PO TABS: 500.0000 mg | ORAL_TABLET | Freq: Three times a day (TID) | ORAL | 0 refills | Status: DC

## 2018-08-28 MED ORDER — CLOPIDOGREL BISULFATE 75 MG PO TABS: 75.0000 mg | ORAL_TABLET | Freq: Every day | ORAL | 1 refills | Status: DC

## 2018-08-28 MED ORDER — GABAPENTIN 100 MG PO CAPS: 200.0000 mg | ORAL_CAPSULE | Freq: Two times a day (BID) | ORAL | 1 refills | Status: DC

## 2018-08-28 NOTE — Progress Notes (Signed)
Virtual Visit via telephone Note  I connected with patient on 08/28/18 at 143pm by telephone and verified that I am speaking with the correct person using two identifiers. Kristie Tapia is currently located at home and patient is currently with her during visit. The provider, Rutherford Guys, MD is located in their office at time of visit.  I discussed the limitations, risks, security and privacy concerns of performing an evaluation and management service by telephone and the availability of in person appointments. I also discussed with the patient that there may be a patient responsible charge related to this service. The patient expressed understanding and agreed to proceed.  No chief complaint on file.   Telephone visit today for routine follouwp: HTN, smoker, h/o CVA, HLP, GERD, diabetic neuropathy, anxiety and fibromylagia  DM2- sees endo, Dr Loanne Drilling, last appt march 2019 Migraines - see neuro, Dr Doy Hutching, doing botox injections  HPI Previous PCP - McVey, PA-C Last OV nov 2019  HTN, smoker, h/o CVA -  On plavix, takes furosemide mostly for edema, takes furosemide daily. Not interested in quiting smoking, smokes about 1ppd. Starting to have some memory problems due to CVA. Has urinary retention, mild more from prolapse than CVA per patient, sees urologist. No falls. Denies any new focal weakness, facial asymmetry or speech changes  HLP - takes crestor, tolerates well  GERD- takes protonix twice a day. Gastroparesis has been ruled out, also has a 'nervous stomach" gerd well controlled on PPI. Takes phenergan as needed for nausea or migraines. No vomiting, no black tarry stools  DM neuropathy - takes gabapentin, has been on it for years, originally prescribed for hot flashes and fibromylagia, does not help much, taking 285m BID, used to be on higher doses, helps with numbness and tingling of neuropathy  Fibromyalgia/OA in left foot s/p surgery x 3/chronic low back pain - applying  for disability, tends to feel most on chest wall, has never tried cymbalta, methocarbamol helps  Anxiety - pmp reviwed. Takes lorazepam prn, takes trazodone for sleep, lexapro helps with quieting her brain  Lab Results  Component Value Date   CHOL 85 12/28/2017   HDL 30 (L) 12/28/2017   LDLCALC 18 12/28/2017   TRIG 185 (H) 12/28/2017   CHOLHDL 2.8 12/28/2017   Lab Results  Component Value Date   CREATININE 0.76 07/25/2018   BUN 5 (L) 07/25/2018   NA 135 07/25/2018   K 3.4 (L) 07/25/2018   CL 99 07/25/2018   CO2 23 07/25/2018     Fall Risk  08/28/2018 07/05/2018 04/15/2018 03/08/2018 02/26/2018  Falls in the past year? 0 0 0 No Yes  Comment - - - - -  Number falls in past yr: 0 - - - 2 or more  Injury with Fall? 0 - - - No  Follow up - - - - -     [facility] 08/28/2018 07/05/2018 04/15/2018  Decreased Interest 0 0 0  Down, Depressed, Hopeless 0 0 0  PHQ - 2 Score 0 0 0    Allergies  Allergen Reactions  . Sulfa Antibiotics Other (See Comments)    Burns the inside of my mouth; "blisters; leaves tongue and inside of mouth solid red"  . Tape Other (See Comments)    Blisters- USE PAPER TAPE ONLY  . Decongestant [Pseudoephedrine Hcl Er] Itching    ALL DECONGESTANTS  . Other Itching    ALL DECONGESTANTS  . Prednisone Other (See Comments)    "Gives me  a bad attitude"  . Chlorpheniramine Itching  . Eggs Or Egg-Derived Products Nausea And Vomiting  . Percocet [Oxycodone-Acetaminophen] Other (See Comments)    PT reports having nightmares when taking Percocet  . Red Dye Other (See Comments)    CAUSES MIGRAINES  . Latex Itching  . Zoloft [Sertraline Hcl] Rash    Prior to Admission medications   Medication Sig Start Date End Date Taking? Authorizing Provider  acetaminophen (TYLENOL) 500 MG tablet Take 1,000 mg by mouth every 6 (six) hours as needed (for headaches).   Yes [provider]  albuterol (PROVENTIL HFA;VENTOLIN HFA) 108 (90 Base) MCG/ACT  inhaler Inhale 2 puffs into the lungs every 6 (six) hours as needed for wheezing or shortness of breath. 02/26/18  Yes McVey, Gelene Mink, PA-C  clopidogrel (PLAVIX) 75 MG tablet TAKE 1 TABLET BY MOUTH ONCE DAILY Patient taking differently: Take 75 mg by mouth at bedtime.  04/23/18  Yes Stallings, Zoe A, MD  Continuous Blood Gluc Sensor (FREESTYLE LIBRE 14 DAY SENSOR) MISC USE AS DIRECTED FOR 14 DAYS 08/19/18  Yes Renato Shin, MD  escitalopram (LEXAPRO) 20 MG tablet TAKE 1 TABLET BY MOUTH ONCE DAILY. PT NEEDS AN OFFICE VISIT 08/08/18  Yes McVey, Gelene Mink, PA-C  fluticasone (FLONASE) 50 MCG/ACT nasal spray Place 1-2 sprays into both nostrils daily as needed for allergies or rhinitis.    Yes [provider]  furosemide (LASIX) 20 MG tablet Take 20 mg by mouth daily as needed for fluid or edema (feet or legs).   Yes [provider]  gabapentin (NEURONTIN) 100 MG capsule Take 1-3 capsules (100-300 mg total) by mouth See admin instructions. Take 1 capsule every morning then take 2 capsules every evening Patient taking differently: Take 200 mg by mouth See admin instructions. Take 200 mg by mouth two times a day- morning and evening 01/13/18  Yes Tereasa Coop, PA-C  Insulin Disposable Pump (V-GO 40) KIT 1 Device by Does not apply route daily. Patient taking differently: 1 Device See admin instructions. Uses with Novolog (capacity is 76 units/day) 11/14/17  Yes Renato Shin, MD  Insulin Lispro (HUMALOG KWIKPEN) 200 UNIT/ML SOPN Inject 150 Units into the skin daily. For use in pump, total of 150 units per day 08/21/18  Yes Renato Shin, MD  LORazepam (ATIVAN) 1 MG tablet TAKE 1 TABLET BY MOUTH TWICE DAILY AS NEEDED FOR ANXIETY ( FOR ACUTE PANIC ONLY. NO MORE THAN 60 TABS IN ONE YEAR FROM 07/06/17) Patient taking differently: Take 1 mg by mouth See admin instructions. Take 1 mg by mouth three to four times a day as needed for anxiety 01/14/18  Yes Tereasa Coop, PA-C   methocarbamol (ROBAXIN) 500 MG tablet Take 0.5-1 tablets (250-500 mg total) by mouth 2 (two) times daily as needed for muscle spasms. Patient taking differently: Take 500 mg by mouth See admin instructions. Take 500 mg by mouth at bedtime and an additional 500 mg once a day as needed for muscle spasms 07/05/18  Yes Sagardia, Ines Bloomer, MD  metoCLOPramide (REGLAN) 10 MG tablet Take 1 tablet (10 mg total) by mouth every 6 (six) hours as needed for nausea. 06/12/17  Yes Jaffe, Adam R, DO  montelukast (SINGULAIR) 10 MG tablet TAKE 1 TABLET BY MOUTH AT BEDTIME 08/08/18  Yes Rutherford Guys, MD  nitroGLYCERIN (NITROSTAT) 0.4 MG SL tablet Place 0.4 mg under the tongue every 5 (five) minutes as needed for chest pain.   Yes [provider]  OnabotulinumtoxinA (BOTOX IM)  Inject into the forehead, temples, scalp, and head for migraines- every 3 months   Yes [provider]  pantoprazole (PROTONIX) 40 MG tablet Take 40 mg by mouth 2 (two) times daily.  08/12/17  Yes [provider]  promethazine (PHENERGAN) 25 MG tablet Take 25 mg by mouth every 6 (six) hours as needed for nausea.  08/05/17  Yes [provider]  rosuvastatin (CRESTOR) 40 MG tablet TAKE 1 TABLET BY MOUTH ONCE DAILY Patient taking differently: Take 40 mg by mouth at bedtime.  07/11/18  Yes Sagardia, Ines Bloomer, MD  saxagliptin HCl (ONGLYZA) 2.5 MG TABS tablet Take 1 tablet (2.5 mg total) by mouth daily. Patient taking differently: Take 2.5 mg by mouth at bedtime.  06/17/18  Yes Renato Shin, MD  traZODone (DESYREL) 100 MG tablet TAKE 1 TABLET BY MOUTH ONCE DAILY AT BEDTIME 08/27/18  Yes Delia Chimes A, MD  topiramate ER (QUDEXY XR) CS24 sprinkle capsule Take 1 capsule (100 mg total) by mouth at bedtime. Patient not taking: Reported on 08/28/2018 12/19/17   [name], DO    Past Medical History:  Diagnosis Date  . Anemia   . Anxiety   . Complication of anesthesia ` 1998   "lungs collapsed during LEEP  procedure"  . Gallstones   . GERD (gastroesophageal reflux disease)   . H/O hiatal hernia   . History of stomach ulcers   . Migraines   . Sleep apnea   . Stroke ([place])   . Type II diabetes mellitus ([place])     Past Surgical History:  Procedure Laterality Date  . ABDOMINAL EXPLORATION SURGERY     "I've had several"  . ABDOMINAL HYSTERECTOMY  08/1998  . APPENDECTOMY  01/03/2012  . DILATION AND CURETTAGE OF UTERUS    . LAPAROSCOPIC APPENDECTOMY  01/03/2012   Procedure: APPENDECTOMY LAPAROSCOPIC;  Surgeon: Edward Jolly, MD;  Location: [facility];  Service: General;  Laterality: N/A;  . LEEP  1998   "lungs collapsed"  . TUBAL LIGATION  05/1998    Social History   Tobacco Use  . Smoking status: Current Every Day Smoker    Packs/day: 1.00    Years: 29.00    Pack years: 29.00    Types: Cigarettes    Last attempt to quit: 03/05/2017    Years since quitting: 1.4  . Smokeless tobacco: Never Used  . Tobacco comment: has smoked since age 89  Substance Use Topics  . Alcohol use: No    Comment: hasn't  had s drink in over 5 yrs    Family History  Problem Relation Age of Onset  . Breast cancer Other   . Diabetes Paternal Grandfather     ROS  Per hpi  Objective  Vitals as reported by the patient: none  There were no vitals filed for this visit.  ASSESSMENT and PLAN  1. Chronic anxiety - traZODone (DESYREL) 100 MG tablet; Take 1 tablet (100 mg total) by mouth at bedtime. - LORazepam (ATIVAN) 1 MG tablet; TAKE 1 TABLET BY MOUTH TWICE DAILY AS NEEDED FOR ANXIETY - escitalopram (LEXAPRO) 20 MG tablet; TAKE 1 TABLET BY MOUTH ONCE DAILY. PT NEEDS AN OFFICE VISIT  2. 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  3. Smoker unmotivated to quit - rosuvastatin (CRESTOR) 40 MG tablet; Take 1 tablet (40 mg total) by mouth daily.  4. Fibromyalgia - methocarbamol (ROBAXIN) 500 MG  tablet; Take 1 tablet (500 mg total) by mouth 3 (three) times daily. - gabapentin (NEURONTIN) 100 MG capsule; Take 2 capsules (200 mg total) by mouth 2 (two) times daily. Take 1 capsule every morning then take 2 capsules every evening  5. Chronic bilateral low back pain without sciatica - clopidogrel (PLAVIX) 75 MG tablet; Take 1 tablet (75 mg total) by mouth daily.  6. Dyslipidemia - Comprehensive metabolic panel; Future - Lipid panel; Future  7. Diabetic polyneuropathy associated with type 2 diabetes mellitus ([place])  8. Gastroesophageal reflux disease without esophagitis  Other orders - furosemide (LASIX) 20 MG tablet; Take 1 tablet (20 mg total) by mouth daily as needed for fluid or edema (feet or legs).   Chronic medical conditions are stable. meds refilled. Pmp reviewed. Consider cymbalta given neuropathy/fibromylagia/anxiety. Labs prior to next OV.  FOLLOW-UP: 3 months   The above assessment and management plan was discussed with the patient. The patient verbalized understanding of and has agreed to the management plan. Patient is aware to call the clinic if symptoms persist or worsen. Patient is aware when to return to the clinic for a follow-up visit. Patient educated on when it is appropriate to go to the emergency department.    I provided 28 minutes of non-face-to-face time during this encounter.  Rutherford Guys, MD Primary Care at [place], [place] [address] Ph.  [phone] Fax [phone]

## 2018-08-28 NOTE — Progress Notes (Signed)
Med refills

## 2018-09-05 ENCOUNTER — Other Ambulatory Visit: Payer: Self-pay | Admitting: Physician Assistant

## 2018-09-10 NOTE — Telephone Encounter (Signed)
If it was received, it was addressed.

## 2018-09-10 NOTE — Telephone Encounter (Signed)
Spoke with other staff members to determine if this was handled in my absence. This was not and the window to complete an appeal has closed. I will need to complete another PA to start the process over again.

## 2018-09-10 NOTE — Telephone Encounter (Signed)
2 weeks ago I placed a letter on your desk re: her denial and appeals process. I have not seen any documentation re: an appeal that was completed or what your decision was re: the denial. I checked all the in boxes to determine if you may have erroneously placed this letter in the incorrect box. I am not able to locate anything. Please advise.

## 2018-09-10 NOTE — Telephone Encounter (Signed)
Patient has called in regards to her insulin being denied by insurance. She is wanting an update on the appeal. States her blood sugar is currently running at 256.  Please Advise, Thanks

## 2018-09-11 NOTE — Telephone Encounter (Signed)
Attempted to call pt to provide her with status update of her appeal and resubmission of her PA. Also wanted to make her aware of new orders below. Unable to reach. No answer and no VM to LVM.

## 2018-09-11 NOTE — Telephone Encounter (Signed)
For now, please add back glimepiride, at same dosage.

## 2018-09-11 NOTE — Telephone Encounter (Signed)
Window to complete appeal has closed. Second PA request  initiated today through Cover My Meds for Humalog Kwikpen 200U. Will await insurance response re: approval/denial.  Kristie Tapia (Key: [id number])   Your information has been submitted to [facility]. [employer] [place] will review the request and fax you a determination directly, typically within 3 business days of your submission once all necessary information is received. If [employer] has not responded in 3 business days or if you have any questions about your submission, contact [employer] [place] at [phone].  Kristie Tapia Key: [id number] Need help? Call us at [phone]  Status  Sent to [employer]  DrugHumaLOG KwikPen 200UNIT/ML pen-injectors  FormBlue Cross  [employer] Form (CB)

## 2018-09-12 ENCOUNTER — Other Ambulatory Visit: Payer: Self-pay | Admitting: Family Medicine

## 2018-09-15 ENCOUNTER — Telehealth: Payer: Self-pay | Admitting: Endocrinology

## 2018-09-15 ENCOUNTER — Telehealth: Payer: Self-pay

## 2018-09-15 ENCOUNTER — Other Ambulatory Visit: Payer: Self-pay

## 2018-09-15 DIAGNOSIS — Z794 Long term (current) use of insulin: Principal | ICD-10-CM

## 2018-09-15 DIAGNOSIS — E1142 Type 2 diabetes mellitus with diabetic polyneuropathy: Secondary | ICD-10-CM

## 2018-09-15 MED ORDER — FREESTYLE LIBRE 14 DAY SENSOR MISC: 1.0000 | 11 refills | Status: AC

## 2018-09-15 MED ORDER — GLIMEPIRIDE 2 MG PO TABS: 2.0000 mg | ORAL_TABLET | Freq: Every day | ORAL | 3 refills | Status: DC

## 2018-09-15 NOTE — Telephone Encounter (Signed)
Letter faxed to [facility] Dept [phone] per their request. Confirmation received. Appeals process can take up to 7 days to receive a response.

## 2018-09-15 NOTE — Telephone Encounter (Signed)
Cover My Meds closed SECOND REQUEST advising letter to proceed with APPEALS must be faxed to [facility].

## 2018-09-15 NOTE — Telephone Encounter (Signed)
glimepiride (AMARYL) 2 MG tablet 30 tablet 3 09/15/2018    Sig - Route: Take 1 tablet (2 mg total) by mouth daily before breakfast. - Oral   Sent to pharmacy as: glimepiride (AMARYL) 2 MG tablet   E-Prescribing Status: Sent to pharmacy (09/15/2018 11:59 AM EDT)    Continuous Blood Gluc Sensor (FREESTYLE LIBRE 14 DAY SENSOR) MISC 2 each 11 09/15/2018    Sig - Route: 1 each by Other route every 14 (fourteen) days. Use to monitor glucose levels. Change sensor every 14 days - Other   Sent to pharmacy as: Continuous Blood Gluc Sensor (FREESTYLE LIBRE 14 DAY SENSOR) Misc   E-Prescribing Status: Receipt confirmed by pharmacy (09/15/2018 11:59 AM EDT)    [facility] - [place], [place] - [address]  Called pt and informed her of above. Also informed her that clinical information has been sent to [facility] Dept per their request. Awaiting response re: appeal.

## 2018-09-15 NOTE — Telephone Encounter (Signed)
Closed as duplicate 

## 2018-09-15 NOTE — Telephone Encounter (Signed)
Per [facility], "The caller states she is returning a call."

## 2018-09-16 ENCOUNTER — Telehealth: Payer: Self-pay | Admitting: Endocrinology

## 2018-09-16 NOTE — Telephone Encounter (Signed)
Returned call. Spoke with Chanel who transferred me to [place]. Asked if patient was still taking Novolog via V-Go. Advised office notes and letter were sent stating pt would need to change medication from Novolog to Humalog U200 as pt has reached max limits of her V-Go while using Novolog. Verbalized acceptance and understanding. States a response re: appeal will be received tomorrow.

## 2018-09-16 NOTE — Telephone Encounter (Signed)
Ref # [id number]  Questions about PA for Humalog  [phone]

## 2018-09-17 NOTE — Telephone Encounter (Signed)
Received notification from[employer]BS Appeals that denial for Humalog U200 has been over turned and has been APPROVED 08/21/18 through 08/21/19. Ref #[id number]. Letter has been labeled and placed in scan file for HIM and for our future reference.

## 2018-09-17 NOTE — Telephone Encounter (Signed)
Received notification fromBCBS Appeals that denial for Humalog U200 has been over turned and has been APPROVED 08/21/18 through 08/21/19. Ref #[id number]. Letter has been labeled and placed in scan file for HIM and for our future reference. 

## 2018-09-18 ENCOUNTER — Other Ambulatory Visit: Payer: Self-pay | Admitting: Physician Assistant

## 2018-09-18 DIAGNOSIS — R059 Cough, unspecified: Secondary | ICD-10-CM

## 2018-09-18 DIAGNOSIS — R05 Cough: Secondary | ICD-10-CM

## 2018-09-26 ENCOUNTER — Telehealth: Payer: Self-pay | Admitting: Endocrinology

## 2018-09-26 NOTE — Telephone Encounter (Signed)
[facility] message passed on to Kristie Tapia. about patients blood sugar being over 500.

## 2018-09-26 NOTE — Telephone Encounter (Signed)
Please verif no n/v/sob. Please take 1 click every hour for any blood sugar in the 300's.  2 clicks if over 400. Needs f/u next week.

## 2018-09-26 NOTE — Telephone Encounter (Signed)
Called and spoke with pt. She advised that she just picked up the Humalog prescription this morning. When she tested her sugars they were over 500. Pt stated that she used 3 clicks of the new medication and also took 20 units of the old insulin 45 minutes ago. Stated that she did not each lunch because her sugars were so high.   Earlier this week the patient stated she had to use 30 units of her old medication and wait about 4 hours for her sugars to drop below 100.   She advised that she has a monitor that can be downloaded by the office and the only symptoms she is currently having are some dizziness and nausea.   Pt was advised that if sugars did not come down, elevated any or symptoms change or worsen that she needs to be evaluated. Pt stated an understanding. Message routed to Dr. Everardo All for recommendations.

## 2018-09-26 NOTE — Telephone Encounter (Signed)
Called pt and left a detailed message to advise of Dr. George Hugh recommendations and to have patient call the office back to schedule a follow up next week with Dr. Everardo All.

## 2018-10-06 ENCOUNTER — Other Ambulatory Visit: Payer: Self-pay | Admitting: Family Medicine

## 2018-10-07 ENCOUNTER — Other Ambulatory Visit: Payer: Self-pay | Admitting: Family Medicine

## 2018-11-06 ENCOUNTER — Other Ambulatory Visit: Payer: Self-pay | Admitting: Family Medicine

## 2018-11-07 ENCOUNTER — Telehealth (INDEPENDENT_AMBULATORY_CARE_PROVIDER_SITE_OTHER): Payer: BLUE CROSS/BLUE SHIELD | Admitting: Family Medicine

## 2018-11-07 DIAGNOSIS — M797 Fibromyalgia: Secondary | ICD-10-CM

## 2018-11-07 DIAGNOSIS — F411 Generalized anxiety disorder: Secondary | ICD-10-CM | POA: Diagnosis not present

## 2018-11-07 DIAGNOSIS — Z630 Problems in relationship with spouse or partner: Secondary | ICD-10-CM

## 2018-11-07 MED ORDER — FLUTICASONE PROPIONATE 50 MCG/ACT NA SUSP: 1.0000 | Freq: Every day | NASAL | 5 refills | Status: AC | PRN

## 2018-11-07 MED ORDER — METHOCARBAMOL 500 MG PO TABS: 500.0000 mg | ORAL_TABLET | Freq: Three times a day (TID) | ORAL | 0 refills | Status: DC

## 2018-11-07 MED ORDER — LORAZEPAM 1 MG PO TABS: ORAL_TABLET | ORAL | 2 refills | Status: AC

## 2018-11-07 MED ORDER — TRAZODONE HCL 100 MG PO TABS: 150.0000 mg | ORAL_TABLET | Freq: Every day | ORAL | 1 refills | Status: AC

## 2018-11-07 MED ORDER — DULOXETINE HCL 30 MG PO CPEP: 30.0000 mg | ORAL_CAPSULE | Freq: Two times a day (BID) | ORAL | 3 refills | Status: DC

## 2018-11-07 NOTE — Progress Notes (Signed)
Virtual Visit Note  I connected with patient on 11/07/18 at 200pm by phone and verified that I am speaking with the correct person using two identifiers. Kristie Tapia is currently located at home and patient is currently with them during visit. The provider, Rutherford Guys, MD is located in their office at time of visit.  I discussed the limitations, risks, security and privacy concerns of performing an evaluation and management service by telephone and the availability of in person appointments. I also discussed with the patient that there may be a patient responsible charge related to this service. The patient expressed understanding and agreed to proceed.   CC: fibromylagia  HPI ? HTN, smoker, h/o CVA, HLP, GERD, diabetic neuropathy, anxiety and fibromyalgia  Last OV March 2020 Since then seen endo, changed to Humalog U200 Also saw GI - concerns CP is cardiac, referred to cards Started pepcid at bedtime in addition to PPI BID Considering EGD  Ran out gabapentin recently In the past has been on 1279m BID and did not help fibromyalgia Has tried lyrica - when increased dose "made me drunk" Has tried amytriptyline - given for foot pain Has never tried cymbalta  Has been on lexapro for about 227myears for anxiety Anxiety is worse of recent, has marital problems, chronic Reports she is safe Has increased trazodone to 15085mor insomnia Has started using medication app Requesting referral for counseling Takes lorazepam prn pmp reviewed  Will be establishing with new PCP at [facility] due to insurance, has appt for July 8th, [facility]  Allergies  Allergen Reactions  . Sulfa Antibiotics Other (See Comments)    Burns the inside of my mouth; "blisters; leaves tongue and inside of mouth solid red"  . Tape Other (See Comments)    Blisters- USE PAPER TAPE ONLY  . Decongestant [Pseudoephedrine Hcl Er] Itching    ALL DECONGESTANTS  . Other Itching    ALL DECONGESTANTS  .  Prednisone Other (See Comments)    "Gives me a bad attitude"  . Chlorpheniramine Itching  . Eggs Or Egg-Derived Products Nausea And Vomiting  . Percocet [Oxycodone-Acetaminophen] Other (See Comments)    PT reports having nightmares when taking Percocet  . Red Dye Other (See Comments)    CAUSES MIGRAINES  . Latex Itching  . Zoloft [Sertraline Hcl] Rash    Prior to Admission medications   Medication Sig Start Date End Date Taking? Authorizing Provider  Insulin Lispro (HUMALOG KWIKPEN) 200 UNIT/ML SOPN INJECT 150 UNITS INTO THE SKIN DAILY. FOR USE IN PUMP. TOTAL OF 150 UNITS PER DAY 10/17/18  Yes [provider]  acetaminophen (TYLENOL) 500 MG tablet Take 1,000 mg by mouth every 6 (six) hours as needed (for headaches).    [provider]  clopidogrel (PLAVIX) 75 MG tablet Take 1 tablet (75 mg total) by mouth daily. 08/28/18   SanRutherford GuysD  Continuous Blood Gluc Sensor (FREESTYLE LIBRE 14 DAY SENSOR) MISC 1 each by Other route every 14 (fourteen) days. Use to monitor glucose levels. Change sensor every 14 days 09/15/18   EllRenato ShinD  escitalopram (LEXAPRO) 20 MG tablet TAKE 1 TABLET BY MOUTH ONCE DAILY. PT NEEDS AN OFFICE VISIT 08/28/18   SanRutherford GuysD  fluticasone ([facility] MCG/ACT nasal spray Place 1-2 sprays into both nostrils daily as needed for allergies or rhinitis.     [provider]  furosemide (LASIX) 20 MG tablet Take 1 tablet (20 mg total) by mouth daily as needed  for fluid or edema (feet or legs). 08/28/18   Rutherford Guys, MD  gabapentin (NEURONTIN) 100 MG capsule Take 2 capsules (200 mg total) by mouth 2 (two) times daily. Take 1 capsule every morning then take 2 capsules every evening 08/28/18   Rutherford Guys, MD  glimepiride (AMARYL) 2 MG tablet Take 1 tablet (2 mg total) by mouth daily before breakfast. 09/15/18   Renato Shin, MD  Insulin Disposable Pump (V-GO 40) KIT 1 Device by Does not apply route daily. Patient taking  differently: 1 Device See admin instructions. Uses with Novolog (capacity is 76 units/day) 11/14/17   Renato Shin, MD  LORazepam (ATIVAN) 1 MG tablet TAKE 1 TABLET BY MOUTH TWICE DAILY AS NEEDED FOR ANXIETY 08/28/18   Rutherford Guys, MD  methocarbamol (ROBAXIN) 500 MG tablet Take 1 tablet (500 mg total) by mouth 3 (three) times daily. 08/28/18   Rutherford Guys, MD  metoprolol succinate (TOPROL-XL) 25 MG 24 hr tablet  10/05/18   [provider]  montelukast (SINGULAIR) 10 MG tablet TAKE 1 TABLET BY MOUTH AT BEDTIME 11/06/18   Rutherford Guys, MD  nitroGLYCERIN (NITROSTAT) 0.4 MG SL tablet Place 0.4 mg under the tongue every 5 (five) minutes as needed for chest pain.    [provider]  OnabotulinumtoxinA (BOTOX IM) Inject into the forehead, temples, scalp, and head for migraines- every 3 months    [provider]  pantoprazole (PROTONIX) 40 MG tablet Take 40 mg by mouth 2 (two) times daily.  08/12/17   [provider]  promethazine (PHENERGAN) 25 MG tablet Take 25 mg by mouth every 6 (six) hours as needed for nausea.  08/05/17   [provider]  rosuvastatin (CRESTOR) 40 MG tablet Take 1 tablet (40 mg total) by mouth daily. 08/28/18   Rutherford Guys, MD  saxagliptin HCl (ONGLYZA) 2.5 MG TABS tablet Take 1 tablet (2.5 mg total) by mouth daily. Patient taking differently: Take 2.5 mg by mouth at bedtime.  06/17/18   Renato Shin, MD  topiramate ER (QUDEXY XR) CS24 sprinkle capsule Take 1 capsule (100 mg total) by mouth at bedtime. Patient not taking: Reported on 08/28/2018 12/19/17   [name], DO  traZODone (DESYREL) 100 MG tablet Take 1 tablet (100 mg total) by mouth at bedtime. 08/28/18   Rutherford Guys, MD  VENTOLIN HFA 108 [phone] Base) MCG/ACT inhaler INHALE 2 PUFFS BY MOUTH EVERY 6 HOURS AS NEEDED FOR WHEEZING FOR SHORTNESS OF BREATH 09/18/18   Rutherford Guys, MD    Past Medical History:  Diagnosis Date  . Anemia   . Anxiety   . Complication of  anesthesia ` 1998   "lungs collapsed during LEEP procedure"  . Gallstones   . GERD (gastroesophageal reflux disease)   . H/O hiatal hernia   . History of stomach ulcers   . Migraines   . Sleep apnea   . Stroke ([place])   . Type II diabetes mellitus ([place])     Past Surgical History:  Procedure Laterality Date  . ABDOMINAL EXPLORATION SURGERY     "I've had several"  . ABDOMINAL HYSTERECTOMY  08/1998  . APPENDECTOMY  01/03/2012  . DILATION AND CURETTAGE OF UTERUS    . LAPAROSCOPIC APPENDECTOMY  01/03/2012   Procedure: APPENDECTOMY LAPAROSCOPIC;  Surgeon: Edward Jolly, MD;  Location: [facility];  Service: General;  Laterality: N/A;  . LEEP  1998   "lungs collapsed"  . TUBAL LIGATION  05/1998  Social History   Tobacco Use  . Smoking status: Current Every Day Smoker    Packs/day: 1.00    Years: 29.00    Pack years: 29.00    Types: Cigarettes    Last attempt to quit: 03/05/2017    Years since quitting: 1.6  . Smokeless tobacco: Never Used  . Tobacco comment: has smoked since age 18  Substance Use Topics  . Alcohol use: No    Comment: hasn't  had s drink in over 5 yrs    Family History  Problem Relation Age of Onset  . Breast cancer Other   . Diabetes Paternal Grandfather     ROS Per hpi  Objective  Vitals as reported by the patient: none   ASSESSMENT and PLAN  1. GAD (generalized anxiety disorder) 2. Fibromyalgia 3. Marital conflict Uncontrolled. Discussed transitioning from lexapro to cymbalta. Discussed cross taper. Referring for counseling. meds refilled. Patient to establish with new pcp next month per insurance coverage.  - LORazepam (ATIVAN) 1 MG tablet; TAKE 1 TABLET BY MOUTH TWICE DAILY AS NEEDED FOR ANXIETY - traZODone (DESYREL) 100 MG tablet; Take 1.5 tablets (150 mg total) by mouth at bedtime. - methocarbamol (ROBAXIN) 500 MG tablet; Take 1 tablet (500 mg total) by mouth 3 (three) times daily.   - Ambulatory referral to Psychology  Other orders -  DULoxetine (CYMBALTA) 30 MG capsule; Take 1 capsule (30 mg total) by mouth 2 (two) times daily.   The above assessment and management plan was discussed with the patient. The patient verbalized understanding of and has agreed to the management plan. Patient is aware to call the clinic if symptoms persist or worsen. Patient is aware when to return to the clinic for a follow-up visit. Patient educated on when it is appropriate to go to the emergency department.    I provided 24 minutes of non-face-to-face time during this encounter.  Rutherford Guys, MD Primary [facility] at [facility], [place] [address] Ph.  [phone] Fax [phone]

## 2018-11-07 NOTE — Progress Notes (Signed)
Pt is needing refills on pending medicaation. She says that she was taken off novalin and given humalog as of yesterday. The gabapentin did not help with the pain that she is having, asking for something else. Completed the gad 7 score 15

## 2019-01-19 ENCOUNTER — Other Ambulatory Visit: Payer: Self-pay | Admitting: Endocrinology

## 2019-01-19 DIAGNOSIS — E1142 Type 2 diabetes mellitus with diabetic polyneuropathy: Secondary | ICD-10-CM

## 2019-01-19 NOTE — Telephone Encounter (Signed)
Please refill x 1 F/u is due  

## 2019-01-19 NOTE — Telephone Encounter (Signed)
LOV 08/2018. No future appt noted. Please advise how you wish to proceed

## 2019-01-20 ENCOUNTER — Encounter (HOSPITAL_COMMUNITY): Payer: Self-pay | Admitting: Emergency Medicine

## 2019-01-20 ENCOUNTER — Emergency Department (HOSPITAL_COMMUNITY)
Admission: EM | Admit: 2019-01-20 | Discharge: 2019-01-21 | Disposition: A | Discharge status: Home / Self Care | Payer: BLUE CROSS/BLUE SHIELD | Attending: Emergency Medicine | Admitting: Emergency Medicine

## 2019-01-20 ENCOUNTER — Other Ambulatory Visit: Payer: Self-pay

## 2019-01-20 DIAGNOSIS — F17210 Nicotine dependence, cigarettes, uncomplicated: Secondary | ICD-10-CM | POA: Insufficient documentation

## 2019-01-20 DIAGNOSIS — Z79899 Other long term (current) drug therapy: Secondary | ICD-10-CM | POA: Diagnosis not present

## 2019-01-20 DIAGNOSIS — Z8673 Personal history of transient ischemic attack (TIA), and cerebral infarction without residual deficits: Secondary | ICD-10-CM | POA: Insufficient documentation

## 2019-01-20 DIAGNOSIS — Z91040 Latex allergy status: Secondary | ICD-10-CM | POA: Diagnosis not present

## 2019-01-20 DIAGNOSIS — Z794 Long term (current) use of insulin: Secondary | ICD-10-CM | POA: Insufficient documentation

## 2019-01-20 DIAGNOSIS — E119 Type 2 diabetes mellitus without complications: Secondary | ICD-10-CM | POA: Diagnosis not present

## 2019-01-20 DIAGNOSIS — E876 Hypokalemia: Secondary | ICD-10-CM | POA: Insufficient documentation

## 2019-01-20 DIAGNOSIS — Z7902 Long term (current) use of antithrombotics/antiplatelets: Secondary | ICD-10-CM | POA: Insufficient documentation

## 2019-01-20 LAB — COMPREHENSIVE METABOLIC PANEL
ALT: 20 U/L (ref 0–44)
AST: 23 U/L (ref 15–41)
Albumin: 3.9 g/dL (ref 3.5–5.0)
Alkaline Phosphatase: 77 U/L (ref 38–126)
Anion gap: 13 (ref 5–15)
BUN: 5 mg/dL — ABNORMAL LOW (ref 6–20)
CO2: 27 mmol/L (ref 22–32)
Calcium: 8.9 mg/dL (ref 8.9–10.3)
Chloride: 99 mmol/L (ref 98–111)
Creatinine, Ser: 0.67 mg/dL (ref 0.44–1.00)
GFR calc Af Amer: >60 mL/min (ref >60)
GFR calc non Af Amer: >60 mL/min (ref >60)
Glucose, Bld: 185 mg/dL — ABNORMAL HIGH (ref 70–99)
Potassium: 2.8 mmol/L — ABNORMAL LOW (ref 3.5–5.1)
Sodium: 139 mmol/L (ref 135–145)
Total Bilirubin: 1.1 mg/dL (ref 0.3–1.2)
Total Protein: 6.6 g/dL (ref 6.5–8.1)

## 2019-01-20 LAB — CBC WITH DIFFERENTIAL/PLATELET
Abs Immature Granulocytes: 0.16 10*3/uL — ABNORMAL HIGH (ref 0.00–0.07)
Basophils Absolute: 0.1 10*3/uL (ref 0.0–0.1)
Basophils Relative: 1 %
Eosinophils Absolute: 0.3 10*3/uL (ref 0.0–0.5)
Eosinophils Relative: 2 %
HCT: 46 % (ref 36.0–46.0)
Hemoglobin: 15.7 g/dL — ABNORMAL HIGH (ref 12.0–15.0)
Immature Granulocytes: 1 %
Lymphocytes Relative: 15 %
Lymphs Abs: 1.8 10*3/uL (ref 0.7–4.0)
MCH: 30.4 pg (ref 26.0–34.0)
MCHC: 34.1 g/dL (ref 30.0–36.0)
MCV: 89 fL (ref 80.0–100.0)
Monocytes Absolute: 0.7 10*3/uL (ref 0.1–1.0)
Monocytes Relative: 6 %
Neutro Abs: 8.8 10*3/uL — ABNORMAL HIGH (ref 1.7–7.7)
Neutrophils Relative %: 75 %
Platelets: 380 10*3/uL (ref 150–400)
RBC: 5.17 MIL/uL — ABNORMAL HIGH (ref 3.87–5.11)
RDW: 15.1 % (ref 11.5–15.5)
WBC: 11.8 10*3/uL — ABNORMAL HIGH (ref 4.0–10.5)
nRBC: 0 % (ref 0.0–0.2)

## 2019-01-20 NOTE — ED Triage Notes (Signed)
Pt sent here by PCP for low potassium 2.4.  Pt denies any CP at this time. Here to get potassium replaced.

## 2019-01-21 MED ORDER — MAGNESIUM SULFATE 2 GM/50ML IV SOLN
2.0000 g | Freq: Once | INTRAVENOUS | Status: AC
  Administered 2019-01-21: 2 g via INTRAVENOUS
  Filled 2019-01-21: qty 50

## 2019-01-21 MED ORDER — POTASSIUM CHLORIDE 10 MEQ/100ML IV SOLN
10.0000 meq | Freq: Once | INTRAVENOUS | Status: AC
  Administered 2019-01-21: 10 meq via INTRAVENOUS
  Filled 2019-01-21: qty 100

## 2019-01-21 MED ORDER — POTASSIUM CHLORIDE CRYS ER 20 MEQ PO TBCR
80.0000 meq | EXTENDED_RELEASE_TABLET | Freq: Once | ORAL | Status: AC
  Administered 2019-01-21: 80 meq via ORAL
  Filled 2019-01-21: qty 4

## 2019-01-21 NOTE — ED Provider Notes (Signed)
[facility] EMERGENCY DEPARTMENT Provider Note   CSN: [id number] Arrival date & time: 01/20/19  2145     History   Chief Complaint Chief Complaint  Patient presents with  . Abnormal Lab    HPI Kristie Tapia is a 48 y.o. female.     The history is provided by the patient.  Abnormal Lab Result type: chemistry   Chemistry:    Potassium:  Low Patient with history of migraines, diabetes, stroke presents with abnormal labs.  She reports she was called and told that she had low potassium and she should go the ER for evaluation.  She reports she has diarrhea frequently but is unaware of any medicines in any causing this.  When asked how patient feels, she reports "like shit" but reports she is in the ER merely to have her potassium replaced.  All of her other issues are chronic in nature and are getting worked up as an outpatient Her course is stable, nothing worsens her symptoms.  Past Medical History:  Diagnosis Date  . Anemia   . Anxiety   . Complication of anesthesia ` 1998   "lungs collapsed during LEEP procedure"  . Gallstones   . GERD (gastroesophageal reflux disease)   . H/O hiatal hernia   . History of stomach ulcers   . Migraines   . Sleep apnea   . Stroke ([place])   . Type II diabetes mellitus [facility])     Patient Active Problem List   Diagnosis Date Noted  . Lower respiratory infection 07/06/2018  . Cough 07/06/2018  . Chronic musculoskeletal pain 07/06/2018  . CVA (cerebral vascular accident) ([place]) 12/27/2017  . Tobacco abuse 12/27/2017  . Obesity 12/27/2017  . Ischemic stroke ([place]) 12/27/2017  . Diabetic peripheral neuropathy ([place]) 12/27/2017  . Smoker unmotivated to quit 07/07/2017  . Chronic GERD 07/07/2017  . Chronic anxiety 07/07/2017  . Dyslipidemia 02/24/2017  . Diabetes ([place]) 02/24/2017  . Headache 02/24/2017    Past Surgical History:  Procedure Laterality Date  . ABDOMINAL EXPLORATION SURGERY     "I've had several"  .  ABDOMINAL HYSTERECTOMY  08/1998  . APPENDECTOMY  01/03/2012  . DILATION AND CURETTAGE OF UTERUS    . LAPAROSCOPIC APPENDECTOMY  01/03/2012   Procedure: APPENDECTOMY LAPAROSCOPIC;  Surgeon: Edward Jolly, MD;  Location: [facility];  Service: General;  Laterality: N/A;  . LEEP  1998   "lungs collapsed"  . TUBAL LIGATION  05/1998     OB History   No obstetric history on file.      Home Medications    Prior to Admission medications   Medication Sig Start Date End Date Taking? Authorizing Provider  acetaminophen (TYLENOL) 500 MG tablet Take 1,000 mg by mouth every 6 (six) hours as needed (for headaches).    [provider]  clopidogrel (PLAVIX) 75 MG tablet Take 1 tablet (75 mg total) by mouth daily. 08/28/18   Rutherford Guys, MD  Continuous Blood Gluc Sensor (FREESTYLE LIBRE 14 DAY SENSOR) MISC 1 each by Other route every 14 (fourteen) days. Use to monitor glucose levels. Change sensor every 14 days 09/15/18   Renato Shin, MD  DULoxetine (CYMBALTA) 30 MG capsule Take 1 capsule (30 mg total) by mouth 2 (two) times daily. 11/07/18   Rutherford Guys, MD  fluticasone Asencion Islam) 50 MCG/ACT nasal spray Place 1-2 sprays into both nostrils daily as needed for allergies or rhinitis. 11/07/18   Rutherford Guys, MD  furosemide (LASIX) 20 MG tablet  Take 1 tablet (20 mg total) by mouth daily as needed for fluid or edema (feet or legs). 08/28/18   Rutherford Guys, MD  gabapentin (NEURONTIN) 100 MG capsule Take 2 capsules (200 mg total) by mouth 2 (two) times daily. Take 1 capsule every morning then take 2 capsules every evening 08/28/18   Rutherford Guys, MD  glimepiride (AMARYL) 2 MG tablet TAKE 1 TABLET BY MOUTH ONCE DAILY BEFORE BREAKFAST 01/20/19   Renato Shin, MD  Insulin Disposable Pump (V-GO 40) KIT 1 Device by Does not apply route daily. Patient taking differently: 1 Device See admin instructions. Uses with Novolog (capacity is 76 units/day) 11/14/17   Renato Shin, MD  Insulin Lispro  (HUMALOG KWIKPEN) 200 UNIT/ML SOPN INJECT 150 UNITS INTO THE SKIN DAILY. FOR USE IN PUMP. TOTAL OF 150 UNITS PER DAY 10/17/18   [provider]  LORazepam (ATIVAN) 1 MG tablet TAKE 1 TABLET BY MOUTH TWICE DAILY AS NEEDED FOR ANXIETY 11/07/18   Rutherford Guys, MD  methocarbamol (ROBAXIN) 500 MG tablet Take 1 tablet (500 mg total) by mouth 3 (three) times daily. 11/07/18   Rutherford Guys, MD  metoprolol succinate (TOPROL-XL) 25 MG 24 hr tablet  10/05/18   [provider]  montelukast (SINGULAIR) 10 MG tablet TAKE 1 TABLET BY MOUTH AT BEDTIME 11/06/18   Rutherford Guys, MD  nitroGLYCERIN (NITROSTAT) 0.4 MG SL tablet Place 0.4 mg under the tongue every 5 (five) minutes as needed for chest pain.    [provider]  OnabotulinumtoxinA (BOTOX IM) Inject into the forehead, temples, scalp, and head for migraines- every 3 months    [provider]  pantoprazole (PROTONIX) 40 MG tablet Take 40 mg by mouth 2 (two) times daily.  08/12/17   [provider]  promethazine (PHENERGAN) 25 MG tablet Take 25 mg by mouth every 6 (six) hours as needed for nausea.  08/05/17   [provider]  rosuvastatin (CRESTOR) 40 MG tablet Take 1 tablet (40 mg total) by mouth daily. 08/28/18   Rutherford Guys, MD  saxagliptin HCl (ONGLYZA) 2.5 MG TABS tablet Take 1 tablet (2.5 mg total) by mouth daily. Patient taking differently: Take 2.5 mg by mouth at bedtime.  06/17/18   Renato Shin, MD  topiramate ER (QUDEXY XR) CS24 sprinkle capsule Take 1 capsule (100 mg total) by mouth at bedtime. Patient not taking: Reported on 08/28/2018 12/19/17   [name], DO  traZODone (DESYREL) 100 MG tablet Take 1.5 tablets (150 mg total) by mouth at bedtime. 11/07/18   Rutherford Guys, MD  VENTOLIN HFA 108 [phone] Base) MCG/ACT inhaler INHALE 2 PUFFS BY MOUTH EVERY 6 HOURS AS NEEDED FOR WHEEZING FOR SHORTNESS OF BREATH 09/18/18   Rutherford Guys, MD    Family History Family History  Problem Relation Age  of Onset  . Breast cancer Other   . Diabetes Paternal Grandfather     Social History Social History   Tobacco Use  . Smoking status: Current Every Day Smoker    Packs/day: 1.00    Years: 29.00    Pack years: 29.00    Types: Cigarettes    Last attempt to quit: 03/05/2017    Years since quitting: 1.8  . Smokeless tobacco: Never Used  . Tobacco comment: has smoked since age 29  Substance Use Topics  . Alcohol use: No    Comment: hasn't  had s drink in over 5 yrs  . Drug use: No  Allergies   Sulfa antibiotics, Tape, Decongestant [pseudoephedrine hcl er], Other, Prednisone, Chlorpheniramine, Eggs or egg-derived products, Percocet [oxycodone-acetaminophen], Red dye, Latex, and Zoloft [sertraline hcl]   Review of Systems Review of Systems  Constitutional: Positive for fatigue.  All other systems reviewed and are negative.    Physical Exam Updated Vital Signs BP 127/69 (BP Location: Right Arm)   Pulse (!) 58   Temp 97.9 F (36.6 C) (Oral)   Resp 18   Ht 1.651 m (5' 5" )   Wt 83.9 kg   SpO2 98%   BMI 30.79 kg/m   Physical Exam CONSTITUTIONAL: Chronically ill-appearing, no acute distress HEAD: Normocephalic/atraumatic EYES: EOMI ENMT: Mucous membranes moist NECK: supple no meningeal signs SPINE/BACK:entire spine nontender CV: S1/S2 noted, no murmurs/rubs/gallops noted LUNGS: Lungs are clear to auscultation bilaterally, no apparent distress ABDOMEN: soft, nontender NEURO: Pt is awake/alert/appropriate, moves all extremitiesx4.  No facial droop.   EXTREMITIES: pulses normal/equal, full ROM SKIN: warm, color normal PSYCH: no abnormalities of mood noted, alert and oriented to situation   ED Treatments / Results  Labs (all labs ordered are listed, but only abnormal results are displayed) Labs Reviewed  CBC WITH DIFFERENTIAL/PLATELET - Abnormal; Notable for the following components:      Result Value   WBC 11.8 (*)    RBC 5.17 (*)    Hemoglobin 15.7 (*)     Neutro Abs 8.8 (*)    Abs Immature Granulocytes 0.16 (*)    All other components within normal limits  COMPREHENSIVE METABOLIC PANEL - Abnormal; Notable for the following components:   Potassium 2.8 (*)    Glucose, Bld 185 (*)    BUN 5 (*)    All other components within normal limits    EKG EKG Interpretation  Date/Time:  Wednesday January 21 2019 02:42:45 EDT Ventricular Rate:  58 PR Interval:    QRS Duration: 84 QT Interval:  460 QTC Calculation: 452 R Axis:   61 Text Interpretation:  Sinus rhythm Probable anteroseptal infarct, old Interpretation limited secondary to artifact ? u wave in V5/V6 Confirmed by Ripley Fraise [phone]) on 01/21/2019 2:45:42 AM   Radiology No results found.  Procedures Procedures   Medications Ordered in ED Medications  potassium chloride SA (K-DUR) CR tablet 80 mEq (80 mEq Oral Given 01/21/19 0240)  potassium chloride 10 mEq in 100 mL IVPB (10 mEq Intravenous New Bag/Given 01/21/19 0330)  magnesium sulfate IVPB 2 g 50 mL (2 g Intravenous New Bag/Given 01/21/19 0330)     Initial Impression / Assessment and Plan / ED Course  I have reviewed the triage vital signs and the nursing notes.  Pertinent labs  results that were available during my care of the patient were reviewed by me and considered in my medical decision making (see chart for details).        3:26 AM Patient here because she was told by her PCP that she needs to have her potassium replaced.  No other acute issues at this time.  We will replace her potassium, give magnesium as well.  Anticipate discharge 4:31 AM Patient improved, reports she has PCP follow-up this week for potassium check  Final Clinical Impressions(s) / ED Diagnoses   Final diagnoses:  Hypokalemia    ED Discharge Orders    None       Ripley Fraise, MD 01/21/19 [phone]

## 2019-01-21 NOTE — ED Notes (Signed)
Discharge instructions discussed with pt. Pt verbalized understanding. Pt stable and ambulatory. No signature pad available. 

## 2019-04-06 ENCOUNTER — Other Ambulatory Visit: Payer: Self-pay

## 2019-04-06 MED ORDER — ONGLYZA 2.5 MG PO TABS: 2.5000 mg | ORAL_TABLET | Freq: Every day | ORAL | 0 refills | Status: DC

## 2019-05-27 ENCOUNTER — Other Ambulatory Visit: Payer: Self-pay | Admitting: Family Medicine

## 2019-05-27 DIAGNOSIS — G8929 Other chronic pain: Secondary | ICD-10-CM

## 2019-05-27 NOTE — Telephone Encounter (Signed)
Forwarding medication refill request to the clinical pool for review. 

## 2019-06-18 IMAGING — CT CT ANGIO NECK
1 of 8 series · 6 of 33 positions shown · IV contrast (OMNI 350)
Comparison: MRI head December 27, 2017

CLINICAL DATA: RIGHT extremity weakness, follow-up stroke. History
of diabetes, migraines.

EXAM:
CT ANGIOGRAPHY HEAD AND NECK
TECHNIQUE: Multidetector CT imaging of the head and neck was performed using
the standard protocol during bolus administration of intravenous
contrast. Multiplanar CT image reconstructions and MIPs were
obtained to evaluate the vascular anatomy. Carotid stenosis
measurements (when applicable) are obtained utilizing NASCET
criteria, using the distal internal carotid diameter as the
denominator.
CONTRAST:  50mL [id number] IOPAMIDOL ([id number]) INJECTION 76%

[Series 7: cta neck axial · axial · 0.39mm/px · z∈[-241,+12]mm · 6 of 355 slices shown]
[im 51/355  soft-tissue]
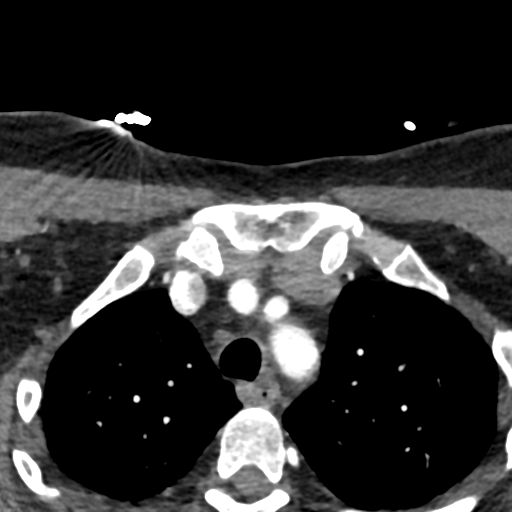
[im 102/355  bone]
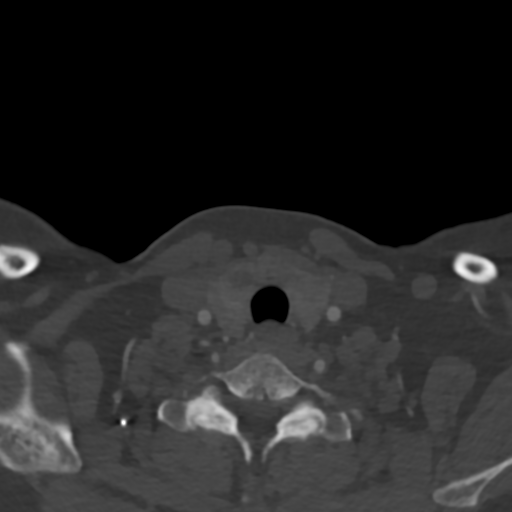
[im 152/355  soft-tissue]
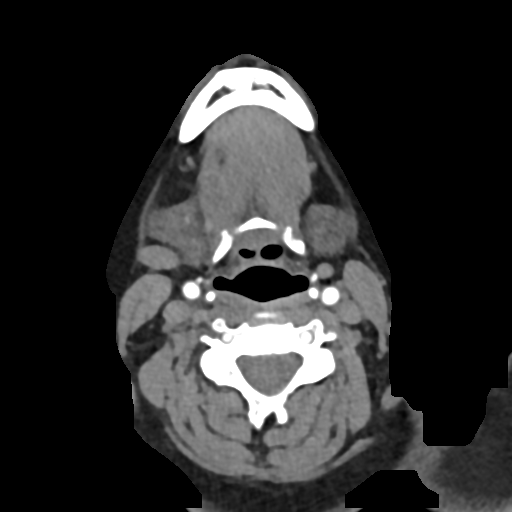
[im 203/355  bone]
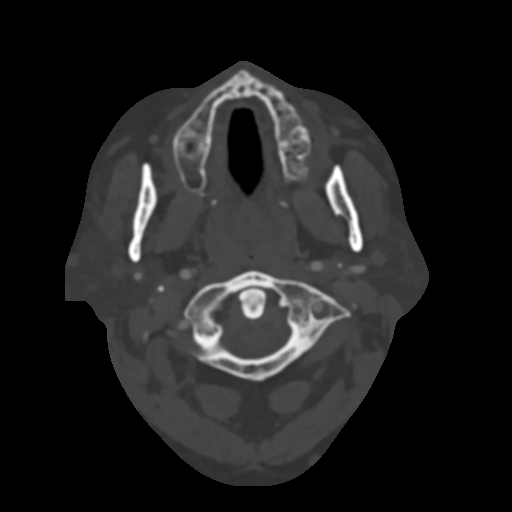
[im 253/355  soft-tissue]
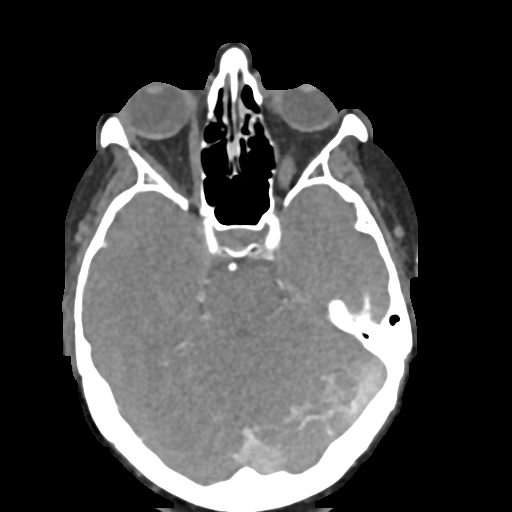
[im 304/355  bone]
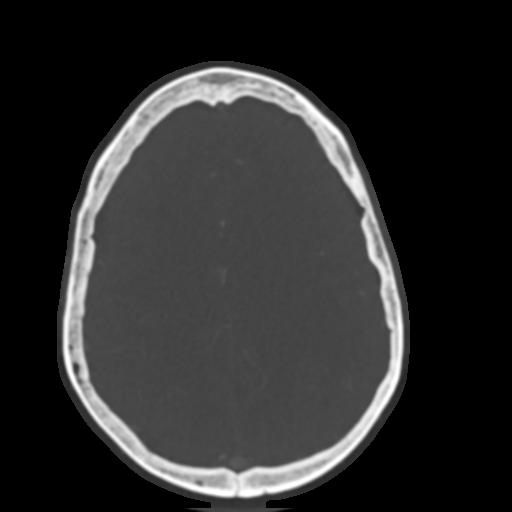

[6 of 33 positions shown; findings below may reference images not displayed]

FINDINGS: CTA NECK FINDINGS:

AORTIC ARCH: Normal appearance of the thoracic arch, normal branch
pattern. Trace calcific atherosclerosis aortic arch. The origins of
the innominate, left Common carotid artery and subclavian artery are
patent. Mild intimal thickening proximal LEFT subclavian artery.

RIGHT CAROTID SYSTEM: Common carotid artery is patent. Trace
calcific atherosclerosis of the carotid bifurcation without
hemodynamically significant stenosis by NASCET criteria. Normal
appearance of the internal carotid artery.

LEFT CAROTID SYSTEM: Common carotid artery is patent. Normal
appearance of the carotid bifurcation without hemodynamically
significant stenosis by NASCET criteria. Normal appearance of the
internal carotid artery.

VERTEBRAL ARTERIES:Left vertebral artery is dominant. Normal
appearance of the vertebral arteries, widely patent.

SKELETON: No acute osseous process though bone windows have not been
submitted.

OTHER NECK: Soft tissues of the neck are nonacute though, not
tailored for evaluation. Heterogeneous thyroid without dominant
nodule. Uncovertebral hypertrophy and facet arthropathy resulting in
moderate to severe RIGHT C3-4 neural foraminal narrowing.

UPPER CHEST: Included lung apices are clear. No superior mediastinal
lymphadenopathy.

CTA HEAD FINDINGS:

ANTERIOR CIRCULATION: Patent cervical internal carotid arteries,
petrous, cavernous and supra clinoid internal carotid arteries.
Patent anterior communicating artery. Patent anterior and middle
cerebral arteries, moderate tandem stenosis mid to distal bilateral
MCA's.

No large vessel occlusion, significant stenosis, contrast
extravasation or aneurysm.

POSTERIOR CIRCULATION: Patent vertebral arteries, vertebrobasilar
junction and basilar artery, as well as main branch vessels. Patent
posterior cerebral arteries, moderate stenosis LEFT P2 segment.

No large vessel occlusion, significant stenosis, contrast
extravasation or aneurysm.

VENOUS SINUSES: Major dural venous sinuses are patent though not
tailored for evaluation on this angiographic examination.

ANATOMIC VARIANTS: None.

DELAYED PHASE: No abnormal intracranial enhancement.

MIP images reviewed.
IMPRESSION: CTA NECK:

1. No hemodynamically significant stenosis ICA's. Patent vertebral
arteries.
2. Moderate to severe RIGHT C3-4 neural foraminal narrowing.

CTA HEAD:

1. No emergent large vessel occlusion or flow-limiting stenosis.
2. Moderate stenoses bilateral MCA and LEFT PCA compatible with
atherosclerosis.

Aortic Atherosclerosis (X2JO4-TUA.A).

## 2019-11-06 ENCOUNTER — Encounter (HOSPITAL_COMMUNITY): Payer: Self-pay

## 2019-11-06 ENCOUNTER — Other Ambulatory Visit: Payer: Self-pay

## 2019-11-06 ENCOUNTER — Ambulatory Visit (HOSPITAL_COMMUNITY)
Admission: EM | Admit: 2019-11-06 | Discharge: 2019-11-06 | Disposition: A | Discharge status: Home / Self Care | Payer: BLUE CROSS/BLUE SHIELD

## 2019-11-06 DIAGNOSIS — S31109A Unspecified open wound of abdominal wall, unspecified quadrant without penetration into peritoneal cavity, initial encounter: Secondary | ICD-10-CM

## 2019-11-06 DIAGNOSIS — L0291 Cutaneous abscess, unspecified: Secondary | ICD-10-CM

## 2019-11-06 MED ORDER — DOXYCYCLINE HYCLATE 100 MG PO CAPS: 100.0000 mg | ORAL_CAPSULE | Freq: Two times a day (BID) | ORAL | 0 refills | Status: DC

## 2019-11-06 MED ORDER — FLUCONAZOLE 150 MG PO TABS: 150.0000 mg | ORAL_TABLET | Freq: Every day | ORAL | 0 refills | Status: DC

## 2019-11-06 NOTE — Discharge Instructions (Signed)
Take the doxycycline 2 time a day for 10 days -Ensure you drink 8 to 16 ounces of water when you take the doxycycline and you may eat with it.  Do not lay down within 30 minutes of taking this medicine -Avoid prolonged sun exposure while taking doxycycline.  If you have to be outside cover up or wear sunscreen  I have prescribed Diflucan to take if you were to have a yeast infection while taking the doxycycline.    Keep the wound covered throughout the day.  You may clean with soap and water and continue to do some soaks.  Want you to follow-up with your OB/GYN to monitor the wound as it heals.  If you have fever, chills, significant change in the wound or collection please return.

## 2019-11-06 NOTE — ED Provider Notes (Signed)
[name]    CSN: [id number] Arrival date & time: 11/06/19  1902      History   Chief Complaint Chief Complaint  Patient presents with  . Abscess    HPI Kristie Tapia is a 49 y.o. female.   Patient history of diabetes presents for suspected abscess in her left groin. She reports this been present for the most part of the week. She reports it started as a small bump and then grew to a large boil. She reports sometime after this it opened and has since been an open wound. She reports this is likely due to her underwear line causing irritation here. She reports is quite painful and is continue to drain. She denies fever and chills. She reports having similar issues in the past and been drained by her OB/GYN.     Past Medical History:  Diagnosis Date  . Anemia   . Anxiety   . Complication of anesthesia ` 1998   "lungs collapsed during LEEP procedure"  . Gallstones   . GERD (gastroesophageal reflux disease)   . H/O hiatal hernia   . History of stomach ulcers   . Migraines   . Sleep apnea   . Stroke (Kristie Tapia)   . Type II diabetes mellitus [facility])     Patient Active Problem List   Diagnosis Date Noted  . Lower respiratory infection 07/06/2018  . Cough 07/06/2018  . Chronic musculoskeletal pain 07/06/2018  . CVA (cerebral vascular accident) (Kristie Tapia) 12/27/2017  . Tobacco abuse 12/27/2017  . Obesity 12/27/2017  . Ischemic stroke (Kristie Tapia) 12/27/2017  . Diabetic peripheral neuropathy (Kristie Tapia) 12/27/2017  . Smoker unmotivated to quit 07/07/2017  . Chronic GERD 07/07/2017  . Chronic anxiety 07/07/2017  . Dyslipidemia 02/24/2017  . Diabetes (Kristie Tapia) 02/24/2017  . Headache 02/24/2017    Past Surgical History:  Procedure Laterality Date  . ABDOMINAL EXPLORATION SURGERY     "I've had several"  . ABDOMINAL HYSTERECTOMY  08/1998  . APPENDECTOMY  01/03/2012  . DILATION AND CURETTAGE OF UTERUS    . LAPAROSCOPIC APPENDECTOMY  01/03/2012   Procedure: APPENDECTOMY LAPAROSCOPIC;   Surgeon: Edward Jolly, MD;  Location: [place];  Service: General;  Laterality: N/A;  . LEEP  1998   "lungs collapsed"  . TUBAL LIGATION  05/1998    OB History   No obstetric history on file.      Home Medications    Prior to Admission medications   Medication Sig Start Date End Date Taking? Authorizing Provider  pregabalin (LYRICA) 50 MG capsule Take 50 mg by mouth daily.   Yes [provider]  acetaminophen (TYLENOL) 500 MG tablet Take 1,000 mg by mouth every 6 (six) hours as needed (for headaches).    [provider]  clopidogrel (PLAVIX) 75 MG tablet Take 1 tablet by mouth once daily 05/28/19   Rutherford Guys, MD  Continuous Blood Gluc Sensor (FREESTYLE LIBRE 14 DAY SENSOR) MISC 1 each by Other route every 14 (fourteen) days. Use to monitor glucose levels. Change sensor every 14 days 09/15/18   Renato Shin, MD  doxycycline (VIBRAMYCIN) 100 MG capsule Take 1 capsule (100 mg total) by mouth 2 (two) times daily. 11/06/19   [name], [name], PA-C  DULoxetine (CYMBALTA) 30 MG capsule Take 1 capsule (30 mg total) by mouth 2 (two) times daily. 11/07/18   Rutherford Guys, MD  fluconazole (DIFLUCAN) 150 MG tablet Take 1 tablet (150 mg total) by mouth daily. 11/06/19   [name], [name],  PA-C  fluticasone (FLONASE) 50 MCG/ACT nasal spray Place 1-2 sprays into both nostrils daily as needed for allergies or rhinitis. 11/07/18   Rutherford Guys, MD  furosemide (LASIX) 20 MG tablet Take 1 tablet (20 mg total) by mouth daily as needed for fluid or edema (feet or legs). 08/28/18   Rutherford Guys, MD  gabapentin (NEURONTIN) 100 MG capsule Take 2 capsules (200 mg total) by mouth 2 (two) times daily. Take 1 capsule every morning then take 2 capsules every evening 08/28/18   Rutherford Guys, MD  glimepiride (AMARYL) 2 MG tablet TAKE 1 TABLET BY MOUTH ONCE DAILY BEFORE BREAKFAST 01/20/19   Renato Shin, MD  Insulin Disposable Pump (V-GO 40) KIT 1 Device by Does not apply route daily. Patient  taking differently: 1 Device See admin instructions. Uses with Novolog (capacity is 76 units/day) 11/14/17   Renato Shin, MD  Insulin Lispro (HUMALOG KWIKPEN) 200 UNIT/ML SOPN INJECT 150 UNITS INTO THE SKIN DAILY. FOR USE IN PUMP. TOTAL OF 150 UNITS PER DAY 10/17/18   [provider]  LORazepam (ATIVAN) 1 MG tablet TAKE 1 TABLET BY MOUTH TWICE DAILY AS NEEDED FOR ANXIETY 11/07/18   Rutherford Guys, MD  methocarbamol (ROBAXIN) 500 MG tablet Take 1 tablet (500 mg total) by mouth 3 (three) times daily. 11/07/18   Rutherford Guys, MD  metoprolol succinate (TOPROL-XL) 25 MG 24 hr tablet  10/05/18   [provider]  montelukast (SINGULAIR) 10 MG tablet TAKE 1 TABLET BY MOUTH AT BEDTIME 11/06/18   Rutherford Guys, MD  nitroGLYCERIN (NITROSTAT) 0.4 MG SL tablet Place 0.4 mg under the tongue every 5 (five) minutes as needed for chest pain.    [provider]  OnabotulinumtoxinA (BOTOX IM) Inject into the forehead, temples, scalp, and head for migraines- every 3 months    [provider]  pantoprazole (PROTONIX) 40 MG tablet Take 40 mg by mouth 2 (two) times daily.  08/12/17   [provider]  promethazine (PHENERGAN) 25 MG tablet Take 25 mg by mouth every 6 (six) hours as needed for nausea.  08/05/17   [provider]  rosuvastatin (CRESTOR) 40 MG tablet Take 1 tablet (40 mg total) by mouth daily. 08/28/18   Rutherford Guys, MD  saxagliptin HCl (ONGLYZA) 2.5 MG TABS tablet Take 1 tablet (2.5 mg total) by mouth at bedtime. 04/06/19   Renato Shin, MD  topiramate ER (QUDEXY XR) CS24 sprinkle capsule Take 1 capsule (100 mg total) by mouth at bedtime. Patient not taking: Reported on 08/28/2018 12/19/17   [name], DO  traZODone (DESYREL) 100 MG tablet Take 1.5 tablets (150 mg total) by mouth at bedtime. 11/07/18   Rutherford Guys, MD  VENTOLIN HFA 108 [phone] Base) MCG/ACT inhaler INHALE 2 PUFFS BY MOUTH EVERY 6 HOURS AS NEEDED FOR WHEEZING FOR SHORTNESS OF BREATH  09/18/18   Rutherford Guys, MD    Family History Family History  Problem Relation Age of Onset  . Breast cancer Other   . Diabetes Paternal Grandfather     Social History Social History   Tobacco Use  . Smoking status: Current Every Day Smoker    Packs/day: 1.00    Years: 29.00    Pack years: 29.00    Types: Cigarettes    Last attempt to quit: 03/05/2017    Years since quitting: 2.6  . Smokeless tobacco: Never Used  . Tobacco comment: has smoked since age 4  Substance Use Topics  .  Alcohol use: No    Comment: hasn't  had s drink in over 5 yrs  . Drug use: No     Allergies   Sulfa antibiotics, Tape, Decongestant [pseudoephedrine hcl er], Chlorpheniramine, Red dye, Eggs or egg-derived products, Latex, Percocet [oxycodone-acetaminophen], Prednisone, and Zoloft [sertraline hcl]   Review of Systems Review of Systems   Physical Exam Triage Vital Signs ED Triage Vitals  Enc Vitals Group     BP --      Pulse Rate 11/06/19 1915 74     Resp 11/06/19 1915 17     Temp 11/06/19 1915 98.1 F (36.7 C)     Temp Source 11/06/19 1915 Oral     SpO2 11/06/19 1915 98 %     Weight --      Height --      Head Circumference --      Peak Flow --      Pain Score 11/06/19 1913 7     Pain Loc --      Pain Edu? --      Excl. in [place]? --    No data found.  Updated Vital Signs BP 123/72 (BP Location: Right Arm)   Pulse 74   Temp 98.1 F (36.7 C) (Oral)   Resp 17   SpO2 98%   Visual Acuity Right Eye Distance:   Left Eye Distance:   Bilateral Distance:    Right Eye Near:   Left Eye Near:    Bilateral Near:     Physical Exam Vitals and nursing note reviewed.  Constitutional:      Appearance: Normal appearance.  Genitourinary:      Comments: There is approximately a 3 cm in length open wound in the left groin. There is some purulence visible. There is no significant surrounding erythema or induration. This is pictured below Neurological:     Mental Status: She is  alert.          UC Treatments / Results  Labs (all labs ordered are listed, but only abnormal results are displayed) Labs Reviewed - No data to display  EKG   Radiology No results found.  Procedures Procedures (including critical care time)  Medications Ordered in UC Medications - No data to display  Initial Impression / Assessment and Plan / UC Course  I have reviewed the triage vital signs and the nursing notes.  Pertinent labs & imaging results that were available during my care of the patient were reviewed by me and considered in my medical decision making (see chart for details).     #Open wound growing #Abscess Patient is a 49 year old with history of diabetes presenting with what appears to be an opened abscess in her left groin. No convincing finding for deeper infection at this time. Will place on doxycycline and have her have close follow-up with her OB/GYN for wound evaluation. Wound care was discussed. Signs of worsening infection were also discussed. Emergency department precautions were discussed. Side effects of doxycycline were discussed. Patient verbalized understanding. -Diflucan prescribed for patient concern of yeast infections with antibiotics. Final Clinical Impressions(s) / UC Diagnoses   Final diagnoses:  Open wound of groin, initial encounter  Abscess     Discharge Instructions     Take the doxycycline 2 time a day for 10 days -Ensure you drink 8 to 16 ounces of water when you take the doxycycline and you may eat with it.  Do not lay down within 30 minutes of taking this medicine -Avoid prolonged  sun exposure while taking doxycycline.  If you have to be outside cover up or wear sunscreen  I have prescribed Diflucan to take if you were to have a yeast infection while taking the doxycycline.    Keep the wound covered throughout the day.  You may clean with soap and water and continue to do some soaks.  Want you to follow-up with your  OB/GYN to monitor the wound as it heals.  If you have fever, chills, significant change in the wound or collection please return.    ED Prescriptions    Medication Sig Dispense Auth. Provider   doxycycline (VIBRAMYCIN) 100 MG capsule Take 1 capsule (100 mg total) by mouth 2 (two) times daily. 20 capsule Tacoya Altizer, [name], PA-C   fluconazole (DIFLUCAN) 150 MG tablet Take 1 tablet (150 mg total) by mouth daily. 2 tablet Alan Riles, [name], PA-C     PDMP not reviewed this encounter.   Purnell Shoemaker, PA-C 11/06/19 2029

## 2019-11-06 NOTE — ED Triage Notes (Signed)
Pt presents with abscess in left crease area of groin X 1 week.

## 2019-12-27 ENCOUNTER — Ambulatory Visit (INDEPENDENT_AMBULATORY_CARE_PROVIDER_SITE_OTHER): Payer: BLUE CROSS/BLUE SHIELD

## 2019-12-27 ENCOUNTER — Other Ambulatory Visit: Payer: Self-pay

## 2019-12-27 ENCOUNTER — Ambulatory Visit (HOSPITAL_COMMUNITY)
Admission: EM | Admit: 2019-12-27 | Discharge: 2019-12-27 | Disposition: A | Discharge status: Home / Self Care | Payer: BLUE CROSS/BLUE SHIELD | Attending: Emergency Medicine | Admitting: Emergency Medicine

## 2019-12-27 DIAGNOSIS — S90112A Contusion of left great toe without damage to nail, initial encounter: Secondary | ICD-10-CM

## 2019-12-27 DIAGNOSIS — S99922A Unspecified injury of left foot, initial encounter: Secondary | ICD-10-CM | POA: Diagnosis not present

## 2019-12-27 NOTE — Discharge Instructions (Signed)
No fracture on x-ray Tylenol [phone] mg every 4-6 hours Ice and elevate If developing blackened toe and a lot of pain associated may follow-up for drainage

## 2019-12-27 NOTE — ED Triage Notes (Signed)
Pt presents to UC for injury to left great toe. Pt states a "wax melter fell on her toe from above her head while pulling a blanket out of a closet". Pt ambulated to treatment space with limp, unassisted. Upon assessment left great toe found to be swollen and bruised. Pt denies OTC treatment or relieving factors.

## 2019-12-28 NOTE — ED Provider Notes (Signed)
[place]    CSN: [id number] Arrival date & time: 12/27/19  1513      History   Chief Complaint Chief Complaint  Patient presents with  . Toe Injury    HPI Kristie Tapia is a 49 y.o. female presenting today for evaluation of great toe injury.  Patient reports she accidentally dropped a wax Melter onto her foot.  Incident happened yesterday.  Since has developed increased pain swelling and bruising to her left great toe.  Reports prior crush injury in this foot requiring surgery.  Did notice some discoloration of her nailbed.  HPI  Past Medical History:  Diagnosis Date  . Anemia   . Anxiety   . Complication of anesthesia ` 1998   "lungs collapsed during LEEP procedure"  . Gallstones   . GERD (gastroesophageal reflux disease)   . H/O hiatal hernia   . History of stomach ulcers   . Migraines   . Sleep apnea   . Stroke ([place])   . Type II diabetes mellitus [facility])     Patient Active Problem List   Diagnosis Date Noted  . Lower respiratory infection 07/06/2018  . Cough 07/06/2018  . Chronic musculoskeletal pain 07/06/2018  . CVA (cerebral vascular accident) ([place]) 12/27/2017  . Tobacco abuse 12/27/2017  . Obesity 12/27/2017  . Ischemic stroke ([place]) 12/27/2017  . Diabetic peripheral neuropathy ([place]) 12/27/2017  . Smoker unmotivated to quit 07/07/2017  . Chronic GERD 07/07/2017  . Chronic anxiety 07/07/2017  . Dyslipidemia 02/24/2017  . Diabetes ([place]) 02/24/2017  . Headache 02/24/2017    Past Surgical History:  Procedure Laterality Date  . ABDOMINAL EXPLORATION SURGERY     "I've had several"  . ABDOMINAL HYSTERECTOMY  08/1998  . APPENDECTOMY  01/03/2012  . DILATION AND CURETTAGE OF UTERUS    . LAPAROSCOPIC APPENDECTOMY  01/03/2012   Procedure: APPENDECTOMY LAPAROSCOPIC;  Surgeon: Edward Jolly, MD;  Location: [place];  Service: General;  Laterality: N/A;  . LEEP  1998   "lungs collapsed"  . TUBAL LIGATION  05/1998    OB History   No obstetric  history on file.      Home Medications    Prior to Admission medications   Medication Sig Start Date End Date Taking? Authorizing Provider  acetaminophen (TYLENOL) 500 MG tablet Take 1,000 mg by mouth every 6 (six) hours as needed (for headaches).    [provider]  clopidogrel (PLAVIX) 75 MG tablet Take 1 tablet by mouth once daily 05/28/19   Rutherford Guys, MD  Continuous Blood Gluc Sensor (FREESTYLE LIBRE 14 DAY SENSOR) MISC 1 each by Other route every 14 (fourteen) days. Use to monitor glucose levels. Change sensor every 14 days 09/15/18   Renato Shin, MD  doxycycline (VIBRAMYCIN) 100 MG capsule Take 1 capsule (100 mg total) by mouth 2 (two) times daily. 11/06/19   Darr, [name], PA-C  DULoxetine (CYMBALTA) 30 MG capsule Take 1 capsule (30 mg total) by mouth 2 (two) times daily. 11/07/18   Rutherford Guys, MD  fluconazole (DIFLUCAN) 150 MG tablet Take 1 tablet (150 mg total) by mouth daily. 11/06/19   Darr, [name], PA-C  fluticasone (FLONASE) 50 MCG/ACT nasal spray Place 1-2 sprays into both nostrils daily as needed for allergies or rhinitis. 11/07/18   Rutherford Guys, MD  furosemide (LASIX) 20 MG tablet Take 1 tablet (20 mg total) by mouth daily as needed for fluid or edema (feet or legs). 08/28/18   Rutherford Guys, MD  gabapentin (NEURONTIN) 100 MG capsule Take 2 capsules (200 mg total) by mouth 2 (two) times daily. Take 1 capsule every morning then take 2 capsules every evening 08/28/18   Rutherford Guys, MD  glimepiride (AMARYL) 2 MG tablet TAKE 1 TABLET BY MOUTH ONCE DAILY BEFORE BREAKFAST 01/20/19   Renato Shin, MD  Insulin Disposable Pump (V-GO 40) KIT 1 Device by Does not apply route daily. Patient taking differently: 1 Device See admin instructions. Uses with Novolog (capacity is 76 units/day) 11/14/17   Renato Shin, MD  Insulin Lispro (HUMALOG KWIKPEN) 200 UNIT/ML SOPN INJECT 150 UNITS INTO THE SKIN DAILY. FOR USE IN PUMP. TOTAL OF 150 UNITS PER DAY 10/17/18   [provider]  LORazepam (ATIVAN) 1 MG tablet TAKE 1 TABLET BY MOUTH TWICE DAILY AS NEEDED FOR ANXIETY 11/07/18   Rutherford Guys, MD  methocarbamol (ROBAXIN) 500 MG tablet Take 1 tablet (500 mg total) by mouth 3 (three) times daily. 11/07/18   Rutherford Guys, MD  metoprolol succinate (TOPROL-XL) 25 MG 24 hr tablet  10/05/18   [provider]  montelukast (SINGULAIR) 10 MG tablet TAKE 1 TABLET BY MOUTH AT BEDTIME 11/06/18   Rutherford Guys, MD  nitroGLYCERIN (NITROSTAT) 0.4 MG SL tablet Place 0.4 mg under the tongue every 5 (five) minutes as needed for chest pain.    [provider]  OnabotulinumtoxinA (BOTOX IM) Inject into the forehead, temples, scalp, and head for migraines- every 3 months    [provider]  pantoprazole (PROTONIX) 40 MG tablet Take 40 mg by mouth 2 (two) times daily.  08/12/17   [provider]  pregabalin (LYRICA) 50 MG capsule Take 50 mg by mouth daily.    [provider]  promethazine (PHENERGAN) 25 MG tablet Take 25 mg by mouth every 6 (six) hours as needed for nausea.  08/05/17   [provider]  rosuvastatin (CRESTOR) 40 MG tablet Take 1 tablet (40 mg total) by mouth daily. 08/28/18   Rutherford Guys, MD  saxagliptin HCl (ONGLYZA) 2.5 MG TABS tablet Take 1 tablet (2.5 mg total) by mouth at bedtime. 04/06/19   Renato Shin, MD  topiramate ER (QUDEXY XR) CS24 sprinkle capsule Take 1 capsule (100 mg total) by mouth at bedtime. Patient not taking: Reported on 08/28/2018 12/19/17   [name], DO  traZODone (DESYREL) 100 MG tablet Take 1.5 tablets (150 mg total) by mouth at bedtime. 11/07/18   Rutherford Guys, MD  VENTOLIN HFA 108 [phone] Base) MCG/ACT inhaler INHALE 2 PUFFS BY MOUTH EVERY 6 HOURS AS NEEDED FOR WHEEZING FOR SHORTNESS OF BREATH 09/18/18   Rutherford Guys, MD    Family History Family History  Problem Relation Age of Onset  . Breast cancer Other   . Diabetes Paternal Grandfather     Social History Social  History   Tobacco Use  . Smoking status: Current Every Day Smoker    Packs/day: 1.00    Years: 29.00    Pack years: 29.00    Types: Cigarettes    Last attempt to quit: 03/05/2017    Years since quitting: 2.8  . Smokeless tobacco: Never Used  . Tobacco comment: has smoked since age 4  Vaping Use  . Vaping Use: Never used  Substance Use Topics  . Alcohol use: No    Comment: hasn't  had s drink in over 5 yrs  . Drug use: No     Allergies   Sulfa antibiotics, Tape, Decongestant [  pseudoephedrine hcl er], Chlorpheniramine, Red dye, Eggs or egg-derived products, Latex, Percocet [oxycodone-acetaminophen], Prednisone, and Zoloft [sertraline hcl]   Review of Systems Review of Systems  Constitutional: Negative for fatigue and fever.  HENT: Negative for mouth sores.   Eyes: Negative for visual disturbance.  Respiratory: Negative for shortness of breath.   Cardiovascular: Negative for chest pain.  Gastrointestinal: Negative for abdominal pain, nausea and vomiting.  Genitourinary: Negative for genital sores.  Musculoskeletal: Positive for arthralgias. Negative for joint swelling.  Skin: Positive for color change. Negative for rash and wound.  Neurological: Negative for dizziness, weakness, light-headedness and headaches.     Physical Exam Triage Vital Signs ED Triage Vitals [12/27/19 1617]  Enc Vitals Group     BP (!) 133/72     Pulse Rate 101     Resp 16     Temp 98.1 F (36.7 C)     Temp Source Oral     SpO2 100 %     Weight      Height      Head Circumference      Peak Flow      Pain Score 4     Pain Loc      Pain Edu?      Excl. in [place]?    No data found.  Updated Vital Signs BP (!) 133/72 (BP Location: Right Arm)   Pulse 101   Temp 98.1 F (36.7 C) (Oral)   Resp 16   SpO2 100%   Visual Acuity Right Eye Distance:   Left Eye Distance:   Bilateral Distance:    Right Eye Near:   Left Eye Near:    Bilateral Near:     Physical Exam Vitals and nursing  note reviewed.  Constitutional:      Appearance: She is well-developed.     Comments: No acute distress  HENT:     Head: Normocephalic and atraumatic.     Nose: Nose normal.  Eyes:     Conjunctiva/sclera: Conjunctivae normal.  Cardiovascular:     Rate and Rhythm: Normal rate.  Pulmonary:     Effort: Pulmonary effort is normal. No respiratory distress.  Abdominal:     General: There is no distension.  Musculoskeletal:        General: Normal range of motion.     Cervical back: Neck supple.     Comments: Left foot: Bruising and swelling noted to the great toe, tender to palpation around area of bruising, nail polish in place, unable to visualize presence of subungual hematoma, minimally tender to palpation over nail  Dorsalis pedis 2+  Skin:    General: Skin is warm and dry.  Neurological:     Mental Status: She is alert and oriented to person, place, and time.      UC Treatments / Results  Labs (all labs ordered are listed, but only abnormal results are displayed) Labs Reviewed - No data to display  EKG   Radiology DG Foot Complete Left  Result Date: 12/27/2019 CLINICAL DATA:  49 year old female dropped object on foot. EXAM: LEFT FOOT - COMPLETE 3+ VIEW COMPARISON:  None. FINDINGS: There is no acute fracture or dislocation. The bones are well mineralized. Mild arthritic changes of the first MTP joint. The soft tissues are unremarkable. IMPRESSION: Negative. Electronically Signed   By: Anner Crete M.D.   On: 12/27/2019 16:50    Procedures Procedures (including critical care time)  Medications Ordered in UC Medications - No data to display  Initial  Impression / Assessment and Plan / UC Course  I have reviewed the triage vital signs and the nursing notes.  Pertinent labs & imaging results that were available during my care of the patient were reviewed by me and considered in my medical decision making (see chart for details).     X-ray negative for fracture,  likely contusion.  Discussed with patient possible subungual hematoma, advised if she develops a lot of tenderness around her nail and with removal of nail polish notices significant black discoloration may return to attempt removal.  No pus attempted to remove in clinic, but was unable with nail polish remover pads.  Opting to defer empiric drainage given cannot visualize hematoma.  Discussed strict return precautions. Patient verbalized understanding and is agreeable with plan.  Final Clinical Impressions(s) / UC Diagnoses   Final diagnoses:  Contusion of left great toe without damage to nail, initial encounter     Discharge Instructions     No fracture on x-ray Tylenol [phone] mg every 4-6 hours Ice and elevate If developing blackened toe and a lot of pain associated may follow-up for drainage   ED Prescriptions    None     PDMP not reviewed this encounter.   Janith Lima, PA-C 12/28/19 1402

## 2020-05-23 ENCOUNTER — Emergency Department (HOSPITAL_COMMUNITY): Payer: Medicare Other

## 2020-05-23 ENCOUNTER — Emergency Department (HOSPITAL_COMMUNITY): Payer: Medicare Other | Admitting: Certified Registered Nurse Anesthetist

## 2020-05-23 ENCOUNTER — Other Ambulatory Visit: Payer: Self-pay

## 2020-05-23 ENCOUNTER — Encounter (HOSPITAL_COMMUNITY): Admission: EM | Disposition: A | Discharge status: Home Health | Payer: Self-pay | Source: Home / Self Care

## 2020-05-23 ENCOUNTER — Encounter (HOSPITAL_COMMUNITY): Payer: Self-pay | Admitting: Certified Registered Nurse Anesthetist

## 2020-05-23 ENCOUNTER — Inpatient Hospital Stay (HOSPITAL_COMMUNITY)
Admission: EM | Admit: 2020-05-23 | Discharge: 2020-06-10 | DRG: 463 | Disposition: A | Discharge status: Home Health | Payer: Medicare Other | Attending: Physician Assistant | Admitting: Physician Assistant

## 2020-05-23 DIAGNOSIS — E1165 Type 2 diabetes mellitus with hyperglycemia: Secondary | ICD-10-CM | POA: Diagnosis not present

## 2020-05-23 DIAGNOSIS — Z794 Long term (current) use of insulin: Secondary | ICD-10-CM | POA: Diagnosis not present

## 2020-05-23 DIAGNOSIS — L0231 Cutaneous abscess of buttock: Secondary | ICD-10-CM | POA: Diagnosis not present

## 2020-05-23 DIAGNOSIS — G8929 Other chronic pain: Secondary | ICD-10-CM | POA: Diagnosis present

## 2020-05-23 DIAGNOSIS — L0232 Furuncle of buttock: Secondary | ICD-10-CM | POA: Diagnosis present

## 2020-05-23 DIAGNOSIS — K6819 Other retroperitoneal abscess: Secondary | ICD-10-CM | POA: Diagnosis not present

## 2020-05-23 DIAGNOSIS — G4733 Obstructive sleep apnea (adult) (pediatric): Secondary | ICD-10-CM | POA: Diagnosis present

## 2020-05-23 DIAGNOSIS — E440 Moderate protein-calorie malnutrition: Secondary | ICD-10-CM | POA: Diagnosis present

## 2020-05-23 DIAGNOSIS — E1142 Type 2 diabetes mellitus with diabetic polyneuropathy: Secondary | ICD-10-CM | POA: Diagnosis present

## 2020-05-23 DIAGNOSIS — D62 Acute posthemorrhagic anemia: Secondary | ICD-10-CM | POA: Diagnosis not present

## 2020-05-23 DIAGNOSIS — Z888 Allergy status to other drugs, medicaments and biological substances status: Secondary | ICD-10-CM

## 2020-05-23 DIAGNOSIS — R109 Unspecified abdominal pain: Secondary | ICD-10-CM | POA: Diagnosis present

## 2020-05-23 DIAGNOSIS — B952 Enterococcus as the cause of diseases classified elsewhere: Secondary | ICD-10-CM | POA: Diagnosis not present

## 2020-05-23 DIAGNOSIS — Z8711 Personal history of peptic ulcer disease: Secondary | ICD-10-CM | POA: Diagnosis not present

## 2020-05-23 DIAGNOSIS — E46 Unspecified protein-calorie malnutrition: Secondary | ICD-10-CM | POA: Diagnosis not present

## 2020-05-23 DIAGNOSIS — Z833 Family history of diabetes mellitus: Secondary | ICD-10-CM

## 2020-05-23 DIAGNOSIS — K611 Rectal abscess: Secondary | ICD-10-CM | POA: Diagnosis not present

## 2020-05-23 DIAGNOSIS — M726 Necrotizing fasciitis: Principal | ICD-10-CM | POA: Diagnosis present

## 2020-05-23 DIAGNOSIS — Z7902 Long term (current) use of antithrombotics/antiplatelets: Secondary | ICD-10-CM

## 2020-05-23 DIAGNOSIS — M7918 Myalgia, other site: Secondary | ICD-10-CM | POA: Diagnosis not present

## 2020-05-23 DIAGNOSIS — F17210 Nicotine dependence, cigarettes, uncomplicated: Secondary | ICD-10-CM | POA: Diagnosis present

## 2020-05-23 DIAGNOSIS — F32A Depression, unspecified: Secondary | ICD-10-CM | POA: Diagnosis present

## 2020-05-23 DIAGNOSIS — F17200 Nicotine dependence, unspecified, uncomplicated: Secondary | ICD-10-CM | POA: Diagnosis present

## 2020-05-23 DIAGNOSIS — Z885 Allergy status to narcotic agent status: Secondary | ICD-10-CM

## 2020-05-23 DIAGNOSIS — Z1621 Resistance to vancomycin: Secondary | ICD-10-CM | POA: Diagnosis present

## 2020-05-23 DIAGNOSIS — F419 Anxiety disorder, unspecified: Secondary | ICD-10-CM | POA: Diagnosis present

## 2020-05-23 DIAGNOSIS — I10 Essential (primary) hypertension: Secondary | ICD-10-CM | POA: Diagnosis present

## 2020-05-23 DIAGNOSIS — Z20822 Contact with and (suspected) exposure to covid-19: Secondary | ICD-10-CM | POA: Diagnosis not present

## 2020-05-23 DIAGNOSIS — I639 Cerebral infarction, unspecified: Secondary | ICD-10-CM | POA: Diagnosis not present

## 2020-05-23 DIAGNOSIS — K219 Gastro-esophageal reflux disease without esophagitis: Secondary | ICD-10-CM | POA: Diagnosis present

## 2020-05-23 DIAGNOSIS — Z9641 Presence of insulin pump (external) (internal): Secondary | ICD-10-CM | POA: Diagnosis present

## 2020-05-23 DIAGNOSIS — E119 Type 2 diabetes mellitus without complications: Secondary | ICD-10-CM

## 2020-05-23 DIAGNOSIS — I82619 Acute embolism and thrombosis of superficial veins of unspecified upper extremity: Secondary | ICD-10-CM | POA: Diagnosis not present

## 2020-05-23 DIAGNOSIS — J9601 Acute respiratory failure with hypoxia: Secondary | ICD-10-CM | POA: Diagnosis not present

## 2020-05-23 DIAGNOSIS — Z8673 Personal history of transient ischemic attack (TIA), and cerebral infarction without residual deficits: Secondary | ICD-10-CM

## 2020-05-23 DIAGNOSIS — G43909 Migraine, unspecified, not intractable, without status migrainosus: Secondary | ICD-10-CM | POA: Diagnosis present

## 2020-05-23 DIAGNOSIS — J90 Pleural effusion, not elsewhere classified: Secondary | ICD-10-CM | POA: Diagnosis not present

## 2020-05-23 DIAGNOSIS — Z79899 Other long term (current) drug therapy: Secondary | ICD-10-CM

## 2020-05-23 DIAGNOSIS — Z882 Allergy status to sulfonamides status: Secondary | ICD-10-CM

## 2020-05-23 DIAGNOSIS — E785 Hyperlipidemia, unspecified: Secondary | ICD-10-CM | POA: Diagnosis present

## 2020-05-23 DIAGNOSIS — M7989 Other specified soft tissue disorders: Secondary | ICD-10-CM | POA: Diagnosis present

## 2020-05-23 DIAGNOSIS — F0150 Vascular dementia without behavioral disturbance: Secondary | ICD-10-CM | POA: Diagnosis present

## 2020-05-23 DIAGNOSIS — R52 Pain, unspecified: Secondary | ICD-10-CM | POA: Diagnosis not present

## 2020-05-23 DIAGNOSIS — R0602 Shortness of breath: Secondary | ICD-10-CM

## 2020-05-23 DIAGNOSIS — M797 Fibromyalgia: Secondary | ICD-10-CM | POA: Diagnosis present

## 2020-05-23 DIAGNOSIS — N390 Urinary tract infection, site not specified: Secondary | ICD-10-CM | POA: Diagnosis not present

## 2020-05-23 DIAGNOSIS — Z91040 Latex allergy status: Secondary | ICD-10-CM

## 2020-05-23 DIAGNOSIS — Z9889 Other specified postprocedural states: Secondary | ICD-10-CM | POA: Insufficient documentation

## 2020-05-23 DIAGNOSIS — E876 Hypokalemia: Secondary | ICD-10-CM | POA: Diagnosis present

## 2020-05-23 DIAGNOSIS — Z6833 Body mass index (BMI) 33.0-33.9, adult: Secondary | ICD-10-CM

## 2020-05-23 DIAGNOSIS — E669 Obesity, unspecified: Secondary | ICD-10-CM | POA: Diagnosis present

## 2020-05-23 HISTORY — PX: INCISION AND DRAINAGE PERIRECTAL ABSCESS: SHX1804

## 2020-05-23 LAB — LACTIC ACID, PLASMA: Lactic Acid, Venous: 2 mmol/L (ref 0.5–1.9)

## 2020-05-23 LAB — COMPREHENSIVE METABOLIC PANEL
ALT: 30 U/L (ref 0–44)
AST: 14 U/L — ABNORMAL LOW (ref 15–41)
Albumin: 2.7 g/dL — ABNORMAL LOW (ref 3.5–5.0)
Alkaline Phosphatase: 133 U/L — ABNORMAL HIGH (ref 38–126)
Anion gap: 15 (ref 5–15)
BUN: 18 mg/dL (ref 6–20)
CO2: 22 mmol/L (ref 22–32)
Calcium: 7.9 mg/dL — ABNORMAL LOW (ref 8.9–10.3)
Chloride: 92 mmol/L — ABNORMAL LOW (ref 98–111)
Creatinine, Ser: 1.64 mg/dL — ABNORMAL HIGH (ref 0.44–1.00)
GFR, Estimated: 38 mL/min — ABNORMAL LOW (ref >60)
Glucose, Bld: 490 mg/dL — ABNORMAL HIGH (ref 70–99)
Potassium: 3.2 mmol/L — ABNORMAL LOW (ref 3.5–5.1)
Sodium: 129 mmol/L — ABNORMAL LOW (ref 135–145)
Total Bilirubin: 1.2 mg/dL (ref 0.3–1.2)
Total Protein: 5.9 g/dL — ABNORMAL LOW (ref 6.5–8.1)

## 2020-05-23 LAB — URINALYSIS, ROUTINE W REFLEX MICROSCOPIC
Bilirubin Urine: NEGATIVE
Glucose, UA: >=500 mg/dL — AB
Hgb urine dipstick: NEGATIVE
Ketones, ur: NEGATIVE mg/dL
Leukocytes,Ua: NEGATIVE
Nitrite: NEGATIVE
Protein, ur: NEGATIVE mg/dL
Specific Gravity, Urine: 1.01 (ref 1.005–1.030)
pH: 5 (ref 5.0–8.0)

## 2020-05-23 LAB — CBC
HCT: 43.3 % (ref 36.0–46.0)
Hemoglobin: 15.1 g/dL — ABNORMAL HIGH (ref 12.0–15.0)
MCH: 31.1 pg (ref 26.0–34.0)
MCHC: 34.9 g/dL (ref 30.0–36.0)
MCV: 89.1 fL (ref 80.0–100.0)
Platelets: 289 10*3/uL (ref 150–400)
RBC: 4.86 MIL/uL (ref 3.87–5.11)
RDW: 15.4 % (ref 11.5–15.5)
WBC: 21.4 10*3/uL — ABNORMAL HIGH (ref 4.0–10.5)
nRBC: 0 % (ref 0.0–0.2)

## 2020-05-23 LAB — RESP PANEL BY RT-PCR (FLU A&B, COVID) ARPGX2
Influenza A by PCR: NEGATIVE
Influenza B by PCR: NEGATIVE
SARS Coronavirus 2 by RT PCR: NEGATIVE

## 2020-05-23 LAB — I-STAT BETA HCG BLOOD, ED (MC, WL, AP ONLY): I-stat hCG, quantitative: <5 m[IU]/mL (ref <5)

## 2020-05-23 LAB — LIPASE, BLOOD: Lipase: 17 U/L (ref 11–51)

## 2020-05-23 SURGERY — INCISION AND DRAINAGE, ABSCESS, PERIRECTAL
Anesthesia: General | Site: Buttocks

## 2020-05-23 MED ORDER — CLINDAMYCIN PHOSPHATE 900 MG/50ML IV SOLN
INTRAVENOUS | Status: AC
  Filled 2020-05-23: qty 50

## 2020-05-23 MED ORDER — 0.9 % SODIUM CHLORIDE (POUR BTL) OPTIME
TOPICAL | Status: DC | PRN
  Administered 2020-05-23: 1000 mL

## 2020-05-23 MED ORDER — ACETAMINOPHEN 325 MG PO TABS
650.0000 mg | ORAL_TABLET | Freq: Once | ORAL | Status: AC
  Administered 2020-05-23: 650 mg via ORAL
  Filled 2020-05-23: qty 2

## 2020-05-23 MED ORDER — ONDANSETRON HCL 4 MG/2ML IJ SOLN
4.0000 mg | Freq: Once | INTRAMUSCULAR | Status: AC
  Administered 2020-05-23: 4 mg via INTRAVENOUS
  Filled 2020-05-23: qty 2

## 2020-05-23 MED ORDER — LACTATED RINGERS IV SOLN: INTRAVENOUS | Status: AC

## 2020-05-23 MED ORDER — LIDOCAINE HCL (CARDIAC) PF 100 MG/5ML IV SOSY
PREFILLED_SYRINGE | INTRAVENOUS | Status: DC | PRN
  Administered 2020-05-23: 100 mg via INTRAVENOUS

## 2020-05-23 MED ORDER — ONDANSETRON HCL 4 MG/2ML IJ SOLN
INTRAMUSCULAR | Status: DC | PRN
  Administered 2020-05-23: 4 mg via INTRAVENOUS

## 2020-05-23 MED ORDER — CLINDAMYCIN PHOSPHATE 600 MG/50ML IV SOLN
600.0000 mg | Freq: Three times a day (TID) | INTRAVENOUS | Status: DC
  Administered 2020-05-23 – 2020-05-26 (×8): 600 mg via INTRAVENOUS
  Filled 2020-05-23 (×9): qty 50

## 2020-05-23 MED ORDER — PROMETHAZINE HCL 25 MG/ML IJ SOLN: 25.0000 mg | Freq: Once | INTRAMUSCULAR | Status: DC

## 2020-05-23 MED ORDER — PROPOFOL 1000 MG/100ML IV EMUL
INTRAVENOUS | Status: AC
  Filled 2020-05-23: qty 100

## 2020-05-23 MED ORDER — LACTATED RINGERS IV BOLUS (SEPSIS)
1000.0000 mL | Freq: Once | INTRAVENOUS | Status: AC
Start: 2020-05-23 — End: 2020-05-23
  Administered 2020-05-23: 1000 mL via INTRAVENOUS

## 2020-05-23 MED ORDER — MIDAZOLAM HCL 2 MG/2ML IJ SOLN
INTRAMUSCULAR | Status: AC
  Filled 2020-05-23: qty 2

## 2020-05-23 MED ORDER — STERILE WATER FOR IRRIGATION IR SOLN
Status: DC | PRN
  Administered 2020-05-23: 500 mL

## 2020-05-23 MED ORDER — FENTANYL CITRATE (PF) 100 MCG/2ML IJ SOLN: 50.0000 ug | Freq: Once | INTRAMUSCULAR | Status: DC

## 2020-05-23 MED ORDER — DEXMEDETOMIDINE (PRECEDEX) IN NS 20 MCG/5ML (4 MCG/ML) IV SYRINGE
PREFILLED_SYRINGE | INTRAVENOUS | Status: DC | PRN
  Administered 2020-05-23: 12 ug via INTRAVENOUS

## 2020-05-23 MED ORDER — ONDANSETRON HCL 4 MG/2ML IJ SOLN
INTRAMUSCULAR | Status: AC
  Filled 2020-05-23: qty 2

## 2020-05-23 MED ORDER — LACTATED RINGERS IV BOLUS (SEPSIS)
1000.0000 mL | Freq: Once | INTRAVENOUS | Status: AC
  Administered 2020-05-23: 1000 mL via INTRAVENOUS

## 2020-05-23 MED ORDER — VANCOMYCIN HCL 750 MG/150ML IV SOLN: 750.0000 mg | INTRAVENOUS | Status: DC

## 2020-05-23 MED ORDER — METRONIDAZOLE IN NACL 5-0.79 MG/ML-% IV SOLN
500.0000 mg | Freq: Once | INTRAVENOUS | Status: AC
  Administered 2020-05-23: 500 mg via INTRAVENOUS
  Filled 2020-05-23: qty 100

## 2020-05-23 MED ORDER — PROPOFOL 10 MG/ML IV BOLUS
INTRAVENOUS | Status: AC
  Filled 2020-05-23: qty 20

## 2020-05-23 MED ORDER — MIDAZOLAM HCL 5 MG/5ML IJ SOLN
INTRAMUSCULAR | Status: DC | PRN
  Administered 2020-05-23: 2 mg via INTRAVENOUS

## 2020-05-23 MED ORDER — VANCOMYCIN HCL 1500 MG/300ML IV SOLN
1500.0000 mg | Freq: Once | INTRAVENOUS | Status: AC
  Administered 2020-05-23: 1500 mg via INTRAVENOUS
  Filled 2020-05-23: qty 300

## 2020-05-23 MED ORDER — FENTANYL CITRATE (PF) 100 MCG/2ML IJ SOLN
50.0000 ug | INTRAMUSCULAR | Status: DC | PRN
  Administered 2020-05-23 – 2020-05-25 (×2): 50 ug via INTRAVENOUS
  Filled 2020-05-23 (×2): qty 2

## 2020-05-23 MED ORDER — VANCOMYCIN HCL IN DEXTROSE 1-5 GM/200ML-% IV SOLN: 1000.0000 mg | Freq: Once | INTRAVENOUS | Status: DC

## 2020-05-23 MED ORDER — PROPOFOL 10 MG/ML IV BOLUS
INTRAVENOUS | Status: DC | PRN
  Administered 2020-05-23: 110 mg via INTRAVENOUS

## 2020-05-23 MED ORDER — SODIUM CHLORIDE 0.9 % IV SOLN
2.0000 g | Freq: Two times a day (BID) | INTRAVENOUS | Status: DC
  Administered 2020-05-24: 2 g via INTRAVENOUS
  Filled 2020-05-23: qty 2

## 2020-05-23 MED ORDER — FENTANYL CITRATE (PF) 100 MCG/2ML IJ SOLN
INTRAMUSCULAR | Status: DC | PRN
  Administered 2020-05-23: 100 ug via INTRAVENOUS

## 2020-05-23 MED ORDER — FENTANYL CITRATE (PF) 250 MCG/5ML IJ SOLN
INTRAMUSCULAR | Status: AC
  Filled 2020-05-23: qty 5

## 2020-05-23 MED ORDER — PROMETHAZINE HCL 25 MG/ML IJ SOLN
25.0000 mg | Freq: Once | INTRAMUSCULAR | Status: AC
  Administered 2020-05-23: 25 mg via INTRAVENOUS
  Filled 2020-05-23: qty 1

## 2020-05-23 MED ORDER — PHENYLEPHRINE HCL (PRESSORS) 10 MG/ML IV SOLN
INTRAVENOUS | Status: DC | PRN
  Administered 2020-05-23: 120 ug via INTRAVENOUS
  Administered 2020-05-23: 80 ug via INTRAVENOUS

## 2020-05-23 MED ORDER — ROCURONIUM BROMIDE 100 MG/10ML IV SOLN
INTRAVENOUS | Status: DC | PRN
  Administered 2020-05-23: 80 mg via INTRAVENOUS

## 2020-05-23 MED ORDER — LIDOCAINE 2% (20 MG/ML) 5 ML SYRINGE
INTRAMUSCULAR | Status: AC
  Filled 2020-05-23: qty 5

## 2020-05-23 MED ORDER — IOHEXOL 300 MG/ML  SOLN
70.0000 mL | Freq: Once | INTRAMUSCULAR | Status: AC | PRN
  Administered 2020-05-23: 70 mL via INTRAVENOUS

## 2020-05-23 MED ORDER — SODIUM CHLORIDE 0.9 % IV SOLN
2.0000 g | Freq: Once | INTRAVENOUS | Status: AC
  Administered 2020-05-23: 2 g via INTRAVENOUS
  Filled 2020-05-23: qty 2

## 2020-05-23 SURGICAL SUPPLY — 41 items
BLADE 10 SAFETY STRL DISP (BLADE) ×1 IMPLANT
BLADE 15 SAFETY STRL DISP (BLADE) ×1 IMPLANT
BNDG GAUZE ELAST 4 BULKY (GAUZE/BANDAGES/DRESSINGS) ×1 IMPLANT
COVER SURGICAL LIGHT HANDLE (MISCELLANEOUS) ×2 IMPLANT
DRAPE HALF SHEET 40X57 (DRAPES) ×1 IMPLANT
DRAPE LAPAROTOMY 100X72X124 (DRAPES) ×1 IMPLANT
DRAPE UTILITY XL STRL (DRAPES) ×1 IMPLANT
DRSG PAD ABDOMINAL 8X10 ST (GAUZE/BANDAGES/DRESSINGS) ×1 IMPLANT
ELECT REM PT RETURN 9FT ADLT (ELECTROSURGICAL) ×2
ELECTRODE REM PT RTRN 9FT ADLT (ELECTROSURGICAL) ×1 IMPLANT
GAUZE SPONGE 4X4 12PLY STRL (GAUZE/BANDAGES/DRESSINGS) ×1 IMPLANT
GLOVE BIO SURGEON STRL SZ7.5 (GLOVE) ×1 IMPLANT
GLOVE BIOGEL PI IND STRL 8 (GLOVE) ×1 IMPLANT
GLOVE BIOGEL PI INDICATOR 8 (GLOVE) ×4
GLOVE SURG SS PI 6.5 STRL IVOR (GLOVE) ×3 IMPLANT
GOWN STRL REUS W/ TWL LRG LVL3 (GOWN DISPOSABLE) ×1 IMPLANT
GOWN STRL REUS W/ TWL XL LVL3 (GOWN DISPOSABLE) ×1 IMPLANT
GOWN STRL REUS W/TWL LRG LVL3 (GOWN DISPOSABLE) ×8
GOWN STRL REUS W/TWL XL LVL3 (GOWN DISPOSABLE) ×2
HANDPIECE INTERPULSE COAX TIP (DISPOSABLE) ×2
KIT BASIN OR (CUSTOM PROCEDURE TRAY) ×2 IMPLANT
KIT TURNOVER KIT B (KITS) ×2 IMPLANT
MANIFOLD NEPTUNE II (INSTRUMENTS) ×2 IMPLANT
NS IRRIG 1000ML POUR BTL (IV SOLUTION) ×2 IMPLANT
PACK LITHOTOMY IV (CUSTOM PROCEDURE TRAY) ×2 IMPLANT
PAD ABD 8X10 STRL (GAUZE/BANDAGES/DRESSINGS) ×3 IMPLANT
PAD ARMBOARD 7.5X6 YLW CONV (MISCELLANEOUS) ×4 IMPLANT
PENCIL SMOKE EVACUATOR (MISCELLANEOUS) ×2 IMPLANT
SET HNDPC FAN SPRY TIP SCT (DISPOSABLE) IMPLANT
SOL PREP POV-IOD 4OZ 10% (MISCELLANEOUS) ×3 IMPLANT
SPONGE LAP 18X18 RF (DISPOSABLE) ×4 IMPLANT
SWAB COLLECTION DEVICE MRSA (MISCELLANEOUS) ×1 IMPLANT
SWAB CULTURE ESWAB REG 1ML (MISCELLANEOUS) ×1 IMPLANT
SYR BULB IRRIG 60ML STRL (SYRINGE) ×1 IMPLANT
TAPE PAPER 3X10 WHT MICROPORE (GAUZE/BANDAGES/DRESSINGS) IMPLANT
TOWEL GREEN STERILE (TOWEL DISPOSABLE) ×2 IMPLANT
TOWEL GREEN STERILE FF (TOWEL DISPOSABLE) ×2 IMPLANT
TRAY FOL W/BAG SLVR 16FR STRL (SET/KITS/TRAYS/PACK) IMPLANT
TRAY FOLEY W/BAG SLVR 16FR LF (SET/KITS/TRAYS/PACK) ×2
TUBE CONNECTING 12X1/4 (SUCTIONS) ×3 IMPLANT
YANKAUER SUCT BULB TIP NO VENT (SUCTIONS) ×3 IMPLANT

## 2020-05-23 NOTE — ED Provider Notes (Signed)
[facility] EMERGENCY DEPARTMENT Provider Note   CSN: [id number] Arrival date & time: 05/23/20  1517     History Chief Complaint  Patient presents with   Abdominal Pain    Kristie Tapia is a 49 y.o. female.  HPI   49 year old female with a history of anemia, anxiety, gallstones, GERD, hiatal hernia, stomach ulcers, migraines, sleep apnea, stroke, CVA, who presents to the emergency department today for evaluation of abdominal pain, fevers and skin infection.  Patient states she has had periumbilical abdominal pain for several days.  It is constant and severe in nature.  She is felt very nauseated.  She has had some decreased stool output but was able to have a BM this morning.  States she has been having to strain to urinate but does not have any dysuria.  Additionally reports she has had fevers for the last several days.  She notes that she has some redness and swelling to the right buttock.  She also has a small area to the left buttock as well that was swollen but drained some fluid and is now improving.  She was seen in urgent care prior to arrival and sent here for further evaluation.  Past Medical History:  Diagnosis Date   Anemia    Anxiety    Complication of anesthesia ` 1998   "lungs collapsed during LEEP procedure"   Gallstones    GERD (gastroesophageal reflux disease)    H/O hiatal hernia    History of stomach ulcers    Migraines    Sleep apnea    Stroke ([place])    Type II diabetes mellitus ([place])     Patient Active Problem List   Diagnosis Date Noted   Lower respiratory infection 07/06/2018   Cough 07/06/2018   Chronic musculoskeletal pain 07/06/2018   CVA (cerebral vascular accident) ([place]) 12/27/2017   Tobacco abuse 12/27/2017   Obesity 12/27/2017   Ischemic stroke ([place]) 12/27/2017   Diabetic peripheral neuropathy ([place]) 12/27/2017   Smoker unmotivated to quit 07/07/2017   Chronic GERD 07/07/2017   Chronic anxiety  07/07/2017   Dyslipidemia 02/24/2017   Diabetes ([place]) 02/24/2017   Headache 02/24/2017    Past Surgical History:  Procedure Laterality Date   ABDOMINAL EXPLORATION SURGERY     "I've had several"   ABDOMINAL HYSTERECTOMY  08/1998   APPENDECTOMY  01/03/2012   DILATION AND CURETTAGE OF UTERUS     LAPAROSCOPIC APPENDECTOMY  01/03/2012   Procedure: APPENDECTOMY LAPAROSCOPIC;  Surgeon: Edward Jolly, MD;  Location: [facility];  Service: General;  Laterality: N/A;   LEEP  1998   "lungs collapsed"   TUBAL LIGATION  05/1998     OB History   No obstetric history on file.     Family History  Problem Relation Age of Onset   Breast cancer Other    Diabetes Paternal Grandfather     Social History   Tobacco Use   Smoking status: Current Every Day Smoker    Packs/day: 1.00    Years: 29.00    Pack years: 29.00    Types: Cigarettes    Last attempt to quit: 03/05/2017    Years since quitting: 3.2   Smokeless tobacco: Never Used   Tobacco comment: has smoked since age 66  Vaping Use   Vaping Use: Never used  Substance Use Topics   Alcohol use: No    Comment: hasn't  had s drink in over 5 yrs   Drug use: No  Home Medications Prior to Admission medications   Medication Sig Start Date End Date Taking? Authorizing Provider  acetaminophen (TYLENOL) 500 MG tablet Take 1,000 mg by mouth every 6 (six) hours as needed for fever (for headaches).   Yes [provider]  clopidogrel (PLAVIX) 75 MG tablet Take 1 tablet by mouth once daily Patient taking differently: Take 75 mg by mouth daily. 05/28/19  Yes Jacelyn Pi, Irma M, MD  cyanocobalamin (,VITAMIN B-12,) 1000 MCG/ML injection Inject 1,000 mcg into the muscle every Saturday. 12/23/19  Yes [provider]  diphenoxylate-atropine (LOMOTIL) 2.5-0.025 MG tablet Take 1 tablet by mouth daily. 01/25/20  Yes [provider]  donepezil (ARICEPT) 10 MG tablet Take 10 mg by mouth daily. 05/13/20  Yes  [provider]  escitalopram (LEXAPRO) 20 MG tablet Take 20 mg by mouth at bedtime. 05/12/20  Yes [provider]  famotidine (PEPCID) 40 MG tablet Take 40 mg by mouth at bedtime.   Yes [provider]  fluticasone (FLONASE) 50 MCG/ACT nasal spray Place 1-2 sprays into both nostrils daily as needed for allergies or rhinitis. 11/07/18  Yes Jacelyn Pi, Lilia Argue, MD  furosemide (LASIX) 20 MG tablet Take 1 tablet (20 mg total) by mouth daily as needed for fluid or edema (feet or legs). 08/28/18  Yes Jacelyn Pi, Lilia Argue, MD  ibuprofen (ADVIL) 200 MG tablet Take 800 mg by mouth every 6 (six) hours as needed for fever or headache.   Yes [provider]  insulin lispro (HUMALOG) 100 UNIT/ML KwikPen Inject 10-15 Units into the skin 2 (two) times daily with a meal.   Yes [provider]  LORazepam (ATIVAN) 1 MG tablet TAKE 1 TABLET BY MOUTH TWICE DAILY AS NEEDED FOR ANXIETY Patient taking differently: Take 1 mg by mouth 2 (two) times daily as needed for anxiety. 11/07/18  Yes Jacelyn Pi, Lilia Argue, MD  Multiple Vitamin (MULTIVITAMIN WITH MINERALS) TABS tablet Take 1 tablet by mouth daily.   Yes [provider]  nitroGLYCERIN (NITROSTAT) 0.4 MG SL tablet Place 0.4 mg under the tongue every 5 (five) minutes as needed for chest pain.   Yes [provider]  pantoprazole (PROTONIX) 40 MG tablet Take 40 mg by mouth 2 (two) times daily.  08/12/17  Yes [provider]  pioglitazone (ACTOS) 45 MG tablet Take 45 mg by mouth daily. 02/23/20  Yes [provider]  pregabalin (LYRICA) 100 MG capsule Take 100-200 mg by mouth See admin instructions. Take 1 tablet (100 mg) by mouth every morning and 2 tablets (200 mg) at night 05/02/20  Yes [provider]  promethazine (PHENERGAN) 25 MG tablet Take 25 mg by mouth every 6 (six) hours as needed for nausea.  08/05/17  Yes [provider]  rizatriptan (MAXALT) 10 MG tablet Take 10 mg by  mouth once as needed for migraine. Max 2 tablets/week 04/20/20  Yes [provider]  rosuvastatin (CRESTOR) 20 MG tablet Take 20 mg by mouth at bedtime. 05/02/20  Yes [provider]  traZODone (DESYREL) 100 MG tablet Take 1.5 tablets (150 mg total) by mouth at bedtime. Patient taking differently: Take 200 mg by mouth at bedtime. 11/07/18  Yes Jacelyn Pi, Lilia Argue, MD  VENTOLIN HFA 108 (90 Base) MCG/ACT inhaler INHALE 2 PUFFS BY MOUTH EVERY 6 HOURS AS NEEDED FOR WHEEZING FOR SHORTNESS OF BREATH Patient taking differently: Inhale 2 puffs into the lungs every 6 (six) hours as needed for wheezing or shortness of breath. 09/18/18  Yes Jacelyn Pi, Lilia Argue, MD  Continuous Blood Gluc Sensor (FREESTYLE LIBRE 14 DAY SENSOR) MISC 1 each by Other route every 14 (fourteen) days. Use to monitor glucose levels. Change sensor every 14 days 09/15/18   Renato Shin, MD  Insulin Disposable Pump (V-GO 40) KIT 1 Device by Does not apply route daily. Patient not taking: Reported on 05/23/2020 11/14/17   Renato Shin, MD    Allergies    Sulfa antibiotics, Tape, Decongestant [pseudoephedrine hcl er], Duloxetine, Fremanezumab-vfrm, Tizanidine, Chlorpheniramine, Red dye, Eggs or egg-derived products, Latex, Percocet [oxycodone-acetaminophen], Prednisone, and Zoloft [sertraline hcl]  Review of Systems   Review of Systems  Constitutional: Positive for fever.  HENT: Negative for ear pain and sore throat.   Eyes: Negative for visual disturbance.  Respiratory: Positive for cough (chronic). Negative for shortness of breath.   Cardiovascular: Negative for chest pain.  Gastrointestinal: Positive for abdominal pain and nausea. Negative for constipation, diarrhea and vomiting.  Genitourinary: Negative for dysuria and hematuria.  Musculoskeletal: Negative for back pain.  Skin: Positive for color change and wound.  Neurological: Positive for light-headedness. Negative for headaches.  All other systems  reviewed and are negative.   Physical Exam Updated Vital Signs BP (!) 112/49 (BP Location: Right Arm)    Pulse 85    Temp 98.8 F (37.1 C) (Oral)    Resp 19    Ht _0  (1.651 m)    Wt 87.1 kg    SpO2 95%    BMI 31.95 kg/m   Physical Exam Vitals and nursing note reviewed.  Constitutional:      General: She is not in acute distress.    Appearance: She is well-developed and well-nourished.  HENT:     Head: Normocephalic and atraumatic.  Eyes:     Conjunctiva/sclera: Conjunctivae normal.  Cardiovascular:     Rate and Rhythm: Normal rate and regular rhythm.     Heart sounds: Normal heart sounds. No murmur heard.   Pulmonary:     Effort: Pulmonary effort is normal. No respiratory distress.     Breath sounds: Normal breath sounds. No wheezing, rhonchi or rales.  Abdominal:     General: Bowel sounds are normal.     Palpations: Abdomen is soft.     Tenderness: There is generalized abdominal tenderness. There is guarding. There is no rebound.  Musculoskeletal:        General: No edema.     Cervical back: Neck supple.  Skin:    General: Skin is warm and dry.  Neurological:     Mental Status: She is alert.  Psychiatric:        Mood and Affect: Mood and affect normal.     ED Results / Procedures / Treatments   Labs (all labs ordered are listed, but only abnormal results are displayed) Labs Reviewed  COMPREHENSIVE METABOLIC PANEL - Abnormal; Notable for the following components:      Result Value   Sodium 129 (*)    Potassium 3.2 (*)    Chloride 92 (*)    Glucose, Bld 490 (*)    Creatinine, Ser 1.64 (*)    Calcium 7.9 (*)    Total Protein 5.9 (*)    Albumin 2.7 (*)    AST 14 (*)    Alkaline Phosphatase 133 (*)    GFR, Estimated 38 (*)    All other components within normal limits  CBC - Abnormal; Notable for the following components:   WBC 21.4 (*)    Hemoglobin  15.1 (*)    All other components within normal limits  URINALYSIS, ROUTINE W REFLEX MICROSCOPIC -  Abnormal; Notable for the following components:   APPearance CLOUDY (*)    Glucose, UA >=500 (*)    Bacteria, UA FEW (*)    All other components within normal limits  LACTIC ACID, PLASMA - Abnormal; Notable for the following components:   Lactic Acid, Venous 2.0 (*)    All other components within normal limits  CULTURE, BLOOD (ROUTINE X 2)  RESP PANEL BY RT-PCR (FLU A&B, COVID) ARPGX2  CULTURE, BLOOD (ROUTINE X 2)  URINE CULTURE  LIPASE, BLOOD  LACTIC ACID, PLASMA  PROTIME-INR  APTT  I-STAT BETA HCG BLOOD, ED ([facility], [facility], [facility] ONLY)    EKG None  Radiology CT ABDOMEN PELVIS W CONTRAST  Result Date: 05/23/2020 CLINICAL DATA:  Abdominal pain, fever, nausea, right buttock cellulitis EXAM: CT ABDOMEN AND PELVIS WITH CONTRAST TECHNIQUE: Multidetector CT imaging of the abdomen and pelvis was performed using the standard protocol following bolus administration of intravenous contrast. CONTRAST:  71m OMNIPAQUE IOHEXOL 300 MG/ML  SOLN COMPARISON:  09/02/2017 FINDINGS: Lower chest: No acute pleural or parenchymal lung disease. Hepatobiliary: No focal liver abnormality is seen. Status post cholecystectomy. No biliary dilatation. Pancreas: Unremarkable. No pancreatic ductal dilatation or surrounding inflammatory changes. Spleen: Spleen is enlarged measuring 14.6 cm in craniocaudal length. Stable subcentimeter hypodensity inferior spleen likely small cyst or hemangioma. Adrenals/Urinary Tract: Adrenal glands are unremarkable. Kidneys are normal, without renal calculi, focal lesion, or hydronephrosis. Bladder is unremarkable. Stomach/Bowel: No bowel obstruction or ileus. Minimal diverticulosis of the descending colon without diverticulitis. The appendix is surgically absent. No bowel wall thickening or inflammatory change. Vascular/Lymphatic: Aortic atherosclerosis. Enlarged lymph nodes are seen within the right inguinal region and along the right pelvic sidewall, largest measuring 14 mm on image 85. These are  likely reactive. Reproductive: Status post hysterectomy. No adnexal masses. Other: There is no free intraperitoneal fluid or free gas. Subcutaneous edema and extensive subcutaneous gas is seen within the right buttock consistent with infection. The gas tracks superiorly in the retroperitoneal space along the right ileo psoas muscle and extends into the right perirenal space. I do not see any associated fluid collection or abscess. No abdominal wall hernia. Musculoskeletal: No acute or destructive bony lesions. Reconstructed images demonstrate no additional findings. IMPRESSION: 1. Findings compatible with cellulitis of the right buttock, with extensive subcutaneous gas consistent with gas-forming organism. The gas extends along the right pelvic sidewall in the retroperitoneum along the right iliopsoas muscle to the right perirenal space. No fluid collection or abscess. 2. Splenomegaly. 3. Reactive adenopathy within the right hemipelvis and right inguinal region. These results were called by telephone at the time of interpretation on 05/23/2020 at 8:40 pm to [facility], who verbally acknowledged these results. Electronically Signed   By: MRanda NgoM.D.   On: 05/23/2020 20:40   DG Chest Port 1 View  Result Date: 05/23/2020 CLINICAL DATA:  Nausea and vomiting.  Shortness of breath. EXAM: PORTABLE CHEST 1 VIEW COMPARISON:  December 01, 2017 FINDINGS: The heart size and mediastinal contours are within normal limits. Both lungs are clear. The visualized skeletal structures are unremarkable. IMPRESSION: No active disease. Electronically Signed   By: CConstance HolsterM.D.   On: 05/23/2020 18:12    Procedures Procedures (including critical care time)  CRITICAL CARE Performed by: CRodney Booze  Total critical care time: 40 minutes  Critical care time was exclusive of separately  billable procedures and treating other patients.  Critical care was necessary to treat or prevent imminent or  life-threatening deterioration.  Critical care was time spent personally by me on the following activities: development of treatment plan with patient and/or surrogate as well as nursing, discussions with consultants, evaluation of patient's response to treatment, examination of patient, obtaining history from patient or surrogate, ordering and performing treatments and interventions, ordering and review of laboratory studies, ordering and review of radiographic studies, pulse oximetry and re-evaluation of patient's condition.   Medications Ordered in ED Medications  lactated ringers infusion ( Intravenous New Bag/Given 05/23/20 1901)  fentaNYL (SUBLIMAZE) injection 50 mcg (50 mcg Intravenous Given 05/23/20 2030)  ceFEPIme (MAXIPIME) 2 g in sodium chloride 0.9 % 100 mL IVPB (has no administration in time range)  vancomycin (VANCOREADY) IVPB 750 mg/150 mL (has no administration in time range)  fentaNYL (SUBLIMAZE) injection 50 mcg (50 mcg Intravenous Not Given 05/23/20 2107)  clindamycin (CLEOCIN) IVPB 600 mg (has no administration in time range)  0.9 % irrigation (POUR BTL) (1,000 mLs Irrigation Given 05/23/20 2146)  ceFEPIme (MAXIPIME) 2 g in sodium chloride 0.9 % 100 mL IVPB (0 g Intravenous Stopped 05/23/20 1934)  metroNIDAZOLE (FLAGYL) IVPB 500 mg (0 mg Intravenous Stopped 05/23/20 2027)  lactated ringers bolus 1,000 mL (0 mLs Intravenous Stopped 05/23/20 2027)    And  lactated ringers bolus 1,000 mL (0 mLs Intravenous Stopped 05/23/20 2027)    And  lactated ringers bolus 1,000 mL (0 mLs Intravenous Stopped 05/23/20 2027)  vancomycin (VANCOREADY) IVPB 1500 mg/300 mL (1,500 mg Intravenous New Bag/Given 05/23/20 2028)  ondansetron (ZOFRAN) injection 4 mg (4 mg Intravenous Given 05/23/20 1937)  acetaminophen (TYLENOL) tablet 650 mg (650 mg Oral Given 05/23/20 2029)  promethazine (PHENERGAN) injection 25 mg (25 mg Intravenous Given 05/23/20 2030)  iohexol (OMNIPAQUE) 300 MG/ML solution 70 mL  (70 mLs Intravenous Contrast Given 05/23/20 2000)    ED Course  I have reviewed the triage vital signs and the nursing notes.  Pertinent labs & imaging results that were available during my care of the patient were reviewed by me and considered in my medical decision making (see chart for details).    MDM Rules/Calculators/A&P                          49 year old female presenting to the emergency department today for evaluation of abdominal pain and cellulitis.   Fevers at home to 103, arrives hypotensive with a leukocytosis. Code sepsis initiated, broad spectrum abx ordered and 30 cc/kg bolus ordered.   Reviewed/interpreted labs CBC with significant leukocytosis, elevated hgb, likely due to hemoconcentration CMP with hyponatremia at 129, mild hypokalemia at 3.2, elevated blood glucose at 490, elevated creatining at 1.65 which is new. Liver enzymes are wnl.  Lipase negative Beta hc neg Lactic acid is 2 Blood cultures obtained UA with glucosuria, 6-10 wbcs, few bacteria, and 21-50 squamous epithelial cells.  COVID negative  EKG - NSR, LAE  Reviewed/interpreted imaging CXR - unremarkable  8:40 PM Received call from radiology about CT abd/pelvis which is concerning for necrotizing infection.   8:46 PM CONSULT with Dr. Rosendo Gros who will see the patient and admit to surgical service  Final Clinical Impression(s) / ED Diagnoses Final diagnoses:  Necrotizing fasciitis [facility])    Rx / DC Orders ED Discharge Orders    None       Bishop Dublin 05/23/20 2242    Pattricia Boss, MD  05/31/20 1636

## 2020-05-23 NOTE — Progress Notes (Signed)
[place] to RN to get lactate and blood cultures drawn and antibiotics given

## 2020-05-23 NOTE — ED Triage Notes (Signed)
Was sent for further work up on abdominal pain, increased blood sugar. Endorsed issue with rectal abscess as well.

## 2020-05-23 NOTE — Op Note (Signed)
05/23/2020  11:49 PM  PATIENT:  Kristie Tapia  49 y.o. female  PRE-OPERATIVE DIAGNOSIS:  Abscess Buttocks  POST-OPERATIVE DIAGNOSIS:  Abscess Buttocks  PROCEDURE:  Procedure(s): IRRIGATION AND DEBRIDEMENT BUTTOCKS ABSCESS and retroperitoneum (N/A)  SURGEON:  Surgeon(s) and Role:    Axel Filler, MD - Primary  ANESTHESIA:   general  EBL:  50cc  BLOOD ADMINISTERED:none  DRAINS: Betadine soaked Kerlix  LOCAL MEDICATIONS USED:  NONE  SPECIMEN:  Source of Specimen: Right gluteal abscess  DISPOSITION OF SPECIMEN: Microbiology  COUNTS:  YES  TOURNIQUET:  * No tourniquets in log *  DICTATION: .Dragon Dictation  Indication procedure: Patient is a 49 year old female, who comes in secondary to a right gluteal abscess.  Patient went CT scan was found to have a necrotizing soft tissue infection.  Patient seen back emergently for I&D of abscess.  Findings: Patient had necrotic soft tissue, subcutaneous tissue in the left gluteus that tracked cephalad up through the pelvic floor.  This was along the rectum and could be visualized.  This tracked cephalad into the retroperitoneum.  What appeared to be necrotic tissue extended past my reach.  1.5 Betadine soaked Kerlix were placed into the wound.  Details of procedure: After the patient was consented she was taken back to the OR and placed in supine position with bilateral SCDs in place.  Patient and then placed under general endotracheal anesthesia.  Patient was then placed in the prone position.  She was prepped and draped in standard fashion.  A timeout was called and all facts verified.  At this time an incision made over the area of the abscess.  Cautery was used to maintain hemostasis.  At this time there is a significant amount of malodorous fluid, dirty dishwater type fluid that was draining from the subcutaneous space.  This tracked cephalad.  Some necrotic subcutaneous tissue was debrided.  I was able to track this  cephalad medial to the pubic rami.  This appeared to be along the rectum in the retroperitoneum.  This continue cephalad further then I reach.  At this time irrigation was used to irrigate out this entire area.  I visualized the subcutaneous tissue as far as I can see and debrided as much necrotic subcutaneous tissue that was visualized.  At this time Betadine soaked Kerlix was placed into the subcutaneous and retroperitoneal pelvic area.  There was approximately one half Kerlix that were tied placed into the wound.  These were dressed with ABD pad and tape.  Patient taught procedure well was taken to the recovery in stable condition.  PLAN OF CARE: Admit to inpatient   PATIENT DISPOSITION:  ICU - intubated and hemodynamically stable.   Delay start of Pharmacological VTE agent (>24hrs) due to surgical blood loss or risk of bleeding: no

## 2020-05-23 NOTE — Progress Notes (Signed)
Following for code sepsis 

## 2020-05-23 NOTE — Anesthesia Preprocedure Evaluation (Signed)
Anesthesia Evaluation  Patient identified by MRN, date of birth, ID band Patient awake    Reviewed: Allergy & Precautions, NPO status , Patient's Chart, lab work & pertinent test results  Airway Mallampati: I  TM Distance: >3 FB Neck ROM: Full    Dental   Pulmonary sleep apnea , Current Smoker,    Pulmonary exam normal        Cardiovascular Normal cardiovascular exam     Neuro/Psych Anxiety CVA    GI/Hepatic GERD  Medicated and Controlled,  Endo/Other  diabetes, Poorly Controlled, Type 2, Insulin Dependent  Renal/GU      Musculoskeletal   Abdominal   Peds  Hematology   Anesthesia Other Findings   Reproductive/Obstetrics                             Anesthesia Physical Anesthesia Plan  ASA: III and emergent  Anesthesia Plan: General   Post-op Pain Management:    Induction: Intravenous, Rapid sequence and Cricoid pressure planned  PONV Risk Score and Plan: 2 and Ondansetron and Midazolam  Airway Management Planned: Oral ETT  Additional Equipment:   Intra-op Plan:   Post-operative Plan: Extubation in OR  Informed Consent: I have reviewed the patients History and Physical, chart, labs and discussed the procedure including the risks, benefits and alternatives for the proposed anesthesia with the patient or authorized representative who has indicated his/her understanding and acceptance.       Plan Discussed with: Surgeon and CRNA  Anesthesia Plan Comments:         Anesthesia Quick Evaluation

## 2020-05-23 NOTE — ED Notes (Signed)
Bladder Scan Displayed 0 ml twice. IVF and Antibiotics to be started. PA made aware.

## 2020-05-23 NOTE — Progress Notes (Signed)
Pharmacy Antibiotic Note  Kristie Tapia is a 49 y.o. female admitted on 05/23/2020 with infection of unknown source.  Pharmacy has been consulted for Cefepime and vancomycin dosing.  WBC elevated at 21.4. SCr 1.64.   Plan: -Cefepime 2 gm IV Q 12 hours -Vancomycin 1500 mg IV load followed by Vancomycin 750 mg IV Q 24 hrs. Goal AUC 400-550. Expected AUC: 487 SCr used: 1.6  -Monitor CBC, renal fx, cultures and clinical progress -Vanc levels as indicated   Height: 5\' 5"  (165.1 cm) Weight: 87.1 kg (192 lb) IBW/kg (Calculated) : 57  Temp (24hrs), Avg:98.9 F (37.2 C), Min:98.9 F (37.2 C), Max:98.9 F (37.2 C)  Recent Labs  Lab 05/23/20 1549  WBC 21.4*  CREATININE 1.64*    Estimated Creatinine Clearance: 45.2 mL/min (A) (by C-G formula based on SCr of 1.64 mg/dL (H)).    Allergies  Allergen Reactions  . Sulfa Antibiotics Other (See Comments)    Burns the inside of my mouth; "blisters; leaves tongue and inside of mouth solid red"  . Tape Other (See Comments)    Blisters- USE PAPER TAPE ONLY  . Decongestant [Pseudoephedrine Hcl Er] Itching    ALL DECONGESTANTS  . Duloxetine Other (See Comments)    Felt "out of it"  . Fremanezumab-Vfrm Other (See Comments) and Nausea Only  . Tizanidine Other (See Comments)    headache  . Chlorpheniramine Itching  . Red Dye Other (See Comments)    CAUSES MIGRAINES  . Eggs Or Egg-Derived Products Nausea And Vomiting  . Latex Itching  . Percocet [Oxycodone-Acetaminophen] Other (See Comments)    PT reports having nightmares when taking Percocet  . Prednisone Other (See Comments)    "Gives me a bad attitude"  . Zoloft [Sertraline Hcl] Rash    Antimicrobials this admission: Cefepime 12/20 >>  Vanc 12/20 >>   Dose adjustments this admission:  Microbiology results:   Thank you for allowing pharmacy to be a part of this patient's care.  1/21, PharmD., BCPS, BCCCP Clinical Pharmacist Please refer to [facility] for  unit-specific pharmacist

## 2020-05-23 NOTE — H&P (Signed)
Kristie Tapia is an 49 y.o. female.   Chief Complaint: Buttock pain, abdominal pain HPI: Patient is a 49 year old female, with a history of CVA, diabetes, fibromyalgia, who comes in secondary to right buttock pain and abdominal pain.  Patient states this began this past Friday.  She states that the pain to the right gluteal area became swollen.  She tried to allow this area to drain however it never did.  She states that she had difficulty with walking, hip pain, and abdominal pain.  Patient states she had a fever at home of 103.  Patient states that she has had bowel movements without much pain.  Upon evaluation the ER she underwent CT scan.  CT scan was significant for necrotizing soft tissue infection to the right buttock, with air tracking above the pelvic wall and into the right retroperitoneal/perinephric space.  I did review the CT scan personally. Patient with leukocytosis.  Patient with a lactic acid of 2  Past Medical History:  Diagnosis Date  . Anemia   . Anxiety   . Complication of anesthesia ` 1998   "lungs collapsed during LEEP procedure"  . Gallstones   . GERD (gastroesophageal reflux disease)   . H/O hiatal hernia   . History of stomach ulcers   . Migraines   . Sleep apnea   . Stroke (HCC)   . Type II diabetes mellitus (HCC)     Past Surgical History:  Procedure Laterality Date  . ABDOMINAL EXPLORATION SURGERY     "I've had several"  . ABDOMINAL HYSTERECTOMY  08/1998  . APPENDECTOMY  01/03/2012  . DILATION AND CURETTAGE OF UTERUS    . LAPAROSCOPIC APPENDECTOMY  01/03/2012   Procedure: APPENDECTOMY LAPAROSCOPIC;  Surgeon: Mariella Saa, MD;  Location: [facility] OR;  Service: General;  Laterality: N/A;  . LEEP  1998   "lungs collapsed"  . TUBAL LIGATION  05/1998    Family History  Problem Relation Age of Onset  . Breast cancer Other   . Diabetes Paternal Grandfather    Social History:  reports that she has been smoking cigarettes. She has a 29.00 pack-year smoking  history. She has never used smokeless tobacco. She reports that she does not drink alcohol and does not use drugs.  Allergies:  Allergies  Allergen Reactions  . Sulfa Antibiotics Other (See Comments)    Burns the inside of my mouth; "blisters; leaves tongue and inside of mouth solid red"  . Tape Other (See Comments)    Blisters- USE PAPER TAPE ONLY  . Decongestant [Pseudoephedrine Hcl Er] Itching    ALL DECONGESTANTS  . Duloxetine Other (See Comments)    Felt "out of it"  . Fremanezumab-Vfrm Other (See Comments) and Nausea Only  . Tizanidine Other (See Comments)    headache  . Chlorpheniramine Itching  . Red Dye Other (See Comments)    CAUSES MIGRAINES  . Eggs Or Egg-Derived Products Nausea And Vomiting  . Latex Itching  . Percocet [Oxycodone-Acetaminophen] Other (See Comments)    PT reports having nightmares when taking Percocet  . Prednisone Other (See Comments)    "Gives me a bad attitude"  . Zoloft [Sertraline Hcl] Rash    (Not in a hospital admission)   Results for orders placed or performed during the hospital encounter of 05/23/20 (from the past 48 hour(s))  Lipase, blood     Status: None   Collection Time: 05/23/20  3:49 PM  Result Value Ref Range   Lipase 17 11 - 51 U/L  Comment: Performed at [facility], [address] [place], [place] [address]  Comprehensive metabolic panel     Status: Abnormal   Collection Time: 05/23/20  3:49 PM  Result Value Ref Range   Sodium 129 (L) 135 - 145 mmol/L   Potassium 3.2 (L) 3.5 - 5.1 mmol/L   Chloride 92 (L) 98 - 111 mmol/L   CO2 22 22 - 32 mmol/L   Glucose, Bld 490 (H) 70 - 99 mg/dL    Comment: Glucose reference range applies only to samples taken after fasting for at least 8 hours.   BUN 18 6 - 20 mg/dL   Creatinine, Ser 9.561.64 (H) 0.44 - 1.00 mg/dL   Calcium 7.9 (L) 8.9 - 10.3 mg/dL   Total Protein 5.9 (L) 6.5 - 8.1 g/dL   Albumin 2.7 (L) 3.5 - 5.0 g/dL   AST 14 (L) 15 - 41 U/L   ALT 30 0 - 44 U/L   Alkaline  Phosphatase 133 (H) 38 - 126 U/L   Total Bilirubin 1.2 0.3 - 1.2 mg/dL   GFR, Estimated 38 (L) >60 mL/min    Comment: (NOTE) Calculated using the CKD-EPI Creatinine Equation (2021)    Anion gap 15 5 - 15    Comment: Performed at [facility], [address] [place], [place] [address]  CBC     Status: Abnormal   Collection Time: 05/23/20  3:49 PM  Result Value Ref Range   WBC 21.4 (H) 4.0 - 10.5 K/uL   RBC 4.86 3.87 - 5.11 MIL/uL   Hemoglobin 15.1 (H) 12.0 - 15.0 g/dL   HCT 65.743.3 84.636.0 - 96.246.0 %   MCV 89.1 80.0 - 100.0 fL   MCH 31.1 26.0 - 34.0 pg   MCHC 34.9 30.0 - 36.0 g/dL   RDW 95.215.4 84.111.5 - 32.415.5 %   Platelets 289 150 - 400 K/uL   nRBC 0.0 0.0 - 0.2 %    Comment: Performed at [facility], [address] [place], [place] [address]  I-Stat beta hCG blood, ED     Status: None   Collection Time: 05/23/20  4:00 PM  Result Value Ref Range   I-stat hCG, quantitative <5.0 <5 mIU/mL   Comment 3            Comment:   GEST. AGE      CONC.  (mIU/mL)   <=1 WEEK        5 - 50     2 WEEKS       50 - 500     3 WEEKS       100 - 10,000     4 WEEKS     1,000 - 30,000        FEMALE AND NON-PREGNANT FEMALE:     LESS THAN 5 mIU/mL   Resp Panel by RT-PCR (Flu A&B, Covid) Nasopharyngeal Swab     Status: None   Collection Time: 05/23/20  6:54 PM   Specimen: Nasopharyngeal Swab; Nasopharyngeal(NP) swabs in vial transport medium  Result Value Ref Range   SARS Coronavirus 2 by RT PCR NEGATIVE NEGATIVE    Comment: (NOTE) SARS-CoV-2 target nucleic acids are NOT DETECTED.  The SARS-CoV-2 RNA is generally detectable in upper respiratory specimens during the acute phase of infection. The lowest concentration of SARS-CoV-2 viral copies this assay can detect is 138 copies/mL. A negative result does not preclude SARS-Cov-2 infection and should not be used as the sole basis for treatment  or other patient management decisions. A negative result may occur with  improper specimen  collection/handling, submission of specimen other than nasopharyngeal swab, presence of viral mutation(s) within the areas targeted by this assay, and inadequate number of viral copies(<138 copies/mL). A negative result must be combined with clinical observations, patient history, and epidemiological information. The expected result is Negative.  Fact Sheet for Patients:  BloggerCourse.com  Fact Sheet for Healthcare Providers:  SeriousBroker.it  This test is no t yet approved or cleared by the [place] FDA and  has been authorized for detection and/or diagnosis of SARS-CoV-2 by FDA under an Emergency Use Authorization (EUA). This EUA will remain  in effect (meaning this test can be used) for the duration of the COVID-19 declaration under Section 564(b)(1) of the Act, 21 U.S.C.section 360bbb-3(b)(1), unless the authorization is terminated  or revoked sooner.       Influenza A by PCR NEGATIVE NEGATIVE   Influenza B by PCR NEGATIVE NEGATIVE    Comment: (NOTE) The Xpert Xpress SARS-CoV-2/FLU/RSV plus assay is intended as an aid in the diagnosis of influenza from Nasopharyngeal swab specimens and should not be used as a sole basis for treatment. Nasal washings and aspirates are unacceptable for Xpert Xpress SARS-CoV-2/FLU/RSV testing.  Fact Sheet for Patients: BloggerCourse.com  Fact Sheet for Healthcare Providers: SeriousBroker.it  This test is not yet approved or cleared by the [place] FDA and has been authorized for detection and/or diagnosis of SARS-CoV-2 by FDA under an Emergency Use Authorization (EUA). This EUA will remain in effect (meaning this test can be used) for the duration of the COVID-19 declaration under Section 564(b)(1) of the Act, 21 U.S.C. section 360bbb-3(b)(1), unless the authorization is terminated or revoked.  Performed at [facility], [address], [place], [place] [address]   Lactic acid, plasma     Status: Abnormal   Collection Time: 05/23/20  7:00 PM  Result Value Ref Range   Lactic Acid, Venous 2.0 (HH) 0.5 - 1.9 mmol/L    Comment: CRITICAL RESULT CALLED TO, READ BACK BY AND VERIFIED WITH: L VENGAS,RN 2012 05/23/2020 WBOND Performed at [facility], [address] [place], [place] [address]   Urinalysis, Routine w reflex microscopic Urine, Clean Catch     Status: Abnormal   Collection Time: 05/23/20  8:35 PM  Result Value Ref Range   Color, Urine YELLOW YELLOW   APPearance CLOUDY (A) CLEAR   Specific Gravity, Urine 1.010 1.005 - 1.030   pH 5.0 5.0 - 8.0   Glucose, UA >=500 (A) NEGATIVE mg/dL   Hgb urine dipstick NEGATIVE NEGATIVE   Bilirubin Urine NEGATIVE NEGATIVE   Ketones, ur NEGATIVE NEGATIVE mg/dL   Protein, ur NEGATIVE NEGATIVE mg/dL   Nitrite NEGATIVE NEGATIVE   Leukocytes,Ua NEGATIVE NEGATIVE   RBC / HPF 0-5 0 - 5 RBC/hpf   WBC, UA 6-10 0 - 5 WBC/hpf   Bacteria, UA FEW (A) NONE SEEN   Squamous Epithelial / LPF 21-50 0 - 5   Mucus PRESENT    Amorphous Crystal PRESENT     Comment: Performed at [facility], [address] [place], [place] [address]   CT ABDOMEN PELVIS W CONTRAST  Result Date: 05/23/2020 CLINICAL DATA:  Abdominal pain, fever, nausea, right buttock cellulitis EXAM: CT ABDOMEN AND PELVIS WITH CONTRAST TECHNIQUE: Multidetector CT imaging of the abdomen and pelvis was performed using the standard protocol following bolus administration of intravenous contrast. CONTRAST:  32mL OMNIPAQUE IOHEXOL 300 MG/ML  SOLN COMPARISON:  09/02/2017 FINDINGS: Lower chest: No acute pleural or parenchymal lung disease. Hepatobiliary: No focal liver abnormality is seen. Status post cholecystectomy. No biliary dilatation. Pancreas: Unremarkable. No pancreatic ductal dilatation or surrounding inflammatory changes. Spleen: Spleen is enlarged measuring 14.6 cm in craniocaudal length. Stable  subcentimeter hypodensity inferior spleen likely small cyst or hemangioma. Adrenals/Urinary Tract: Adrenal glands are unremarkable. Kidneys are normal, without renal calculi, focal lesion, or hydronephrosis. Bladder is unremarkable. Stomach/Bowel: No bowel obstruction or ileus. Minimal diverticulosis of the descending colon without diverticulitis. The appendix is surgically absent. No bowel wall thickening or inflammatory change. Vascular/Lymphatic: Aortic atherosclerosis. Enlarged lymph nodes are seen within the right inguinal region and along the right pelvic sidewall, largest measuring 14 mm on image 85. These are likely reactive. Reproductive: Status post hysterectomy. No adnexal masses. Other: There is no free intraperitoneal fluid or free gas. Subcutaneous edema and extensive subcutaneous gas is seen within the right buttock consistent with infection. The gas tracks superiorly in the retroperitoneal space along the right ileo psoas muscle and extends into the right perirenal space. I do not see any associated fluid collection or abscess. No abdominal wall hernia. Musculoskeletal: No acute or destructive bony lesions. Reconstructed images demonstrate no additional findings. IMPRESSION: 1. Findings compatible with cellulitis of the right buttock, with extensive subcutaneous gas consistent with gas-forming organism. The gas extends along the right pelvic sidewall in the retroperitoneum along the right iliopsoas muscle to the right perirenal space. No fluid collection or abscess. 2. Splenomegaly. 3. Reactive adenopathy within the right hemipelvis and right inguinal region. These results were called by telephone at the time of interpretation on 05/23/2020 at 8:40 pm to provider [facility] , who verbally acknowledged these results. Electronically Signed   By: Sharlet Salina M.D.   On: 05/23/2020 20:40   DG Chest Port 1 View  Result Date: 05/23/2020 CLINICAL DATA:  Nausea and vomiting.  Shortness of breath.  EXAM: PORTABLE CHEST 1 VIEW COMPARISON:  December 01, 2017 FINDINGS: The heart size and mediastinal contours are within normal limits. Both lungs are clear. The visualized skeletal structures are unremarkable. IMPRESSION: No active disease. Electronically Signed   By: Katherine Mantle M.D.   On: 05/23/2020 18:12    Review of Systems  Constitutional: Positive for fatigue and fever. Negative for chills.  HENT: Negative for ear discharge, hearing loss and sore throat.   Eyes: Negative for discharge.  Respiratory: Negative for cough and shortness of breath.   Cardiovascular: Negative for chest pain and leg swelling.  Gastrointestinal: Positive for abdominal pain. Negative for constipation, diarrhea, nausea and vomiting.  Genitourinary: Positive for difficulty urinating.  Musculoskeletal: Positive for arthralgias. Negative for myalgias and neck pain.  Skin: Negative for rash.  Allergic/Immunologic: Negative for environmental allergies.  Neurological: Negative for dizziness and seizures.  Hematological: Does not bruise/bleed easily.  Psychiatric/Behavioral: Negative for suicidal ideas.  All other systems reviewed and are negative.   Blood pressure (!) 112/49, pulse 85, temperature 98.8 F (37.1 C), temperature source Oral, resp. rate 19, height  (1.651 m), weight 87.1 kg, SpO2 95 %. Physical Exam Constitutional:      General: Vital signs are normal.     Appearance: She is well-developed and well-nourished.     Comments: Conversant No acute distress  Eyes:     General: Lids are normal. No scleral icterus.    Comments: Pupils are equal round and reactive No lid lag Moist conjunctiva  Neck:     Thyroid: No thyromegaly.  Trachea: No tracheal tenderness.     Comments: No cervical lymphadenopathy Cardiovascular:     Rate and Rhythm: Normal rate and regular rhythm.     Pulses: Intact distal pulses.     Heart sounds: No murmur heard.   Pulmonary:     Effort: Pulmonary effort is  normal.     Breath sounds: Normal breath sounds. No wheezing or rales.  Abdominal:     Palpations: There is no hepatosplenomegaly.     Tenderness: There is no abdominal tenderness.     Hernia: No hernia is present.     Comments: Patient with right gluteal fold crepitus, cellulitis to the right gluteal fold  Skin:    General: Skin is warm.     Findings: No rash.     Nails: There is no clubbing or cyanosis.     Comments: Normal skin turgor  Neurological:     Mental Status: She is alert and oriented to person, place, and time.     Comments: Normal gait and station  Psychiatric:        Judgment: Judgment normal.     Comments: Appropriate affect      Assessment/Plan 49 year old female with necrotizing soft tissue infection History of CVA Anxiety Fibromyalgia Diabetes type 2 1.  We will proceed to the operating room emergently for debridement of right buttocks infection, I discussed with her that this may require further surgery, this may require laparotomy. 2.  Patient was given broad-spectrum antibiotics 3.  Discussed with her the risk and benefits of the procedure to include but not limited to: Infection, bleeding, damage to surrounding nerves, possible need for the surgery and or drainage-the patient voiced understanding wishes to proceed.  Axel Filler, MD 05/23/2020, 9:22 PM

## 2020-05-24 ENCOUNTER — Encounter (HOSPITAL_COMMUNITY): Payer: Self-pay | Admitting: General Surgery

## 2020-05-24 LAB — CBC
HCT: 40.4 % (ref 36.0–46.0)
Hemoglobin: 14 g/dL (ref 12.0–15.0)
MCH: 31 pg (ref 26.0–34.0)
MCHC: 34.7 g/dL (ref 30.0–36.0)
MCV: 89.4 fL (ref 80.0–100.0)
Platelets: 304 10*3/uL (ref 150–400)
RBC: 4.52 MIL/uL (ref 3.87–5.11)
RDW: 15.7 % — ABNORMAL HIGH (ref 11.5–15.5)
WBC: 20.9 10*3/uL — ABNORMAL HIGH (ref 4.0–10.5)
nRBC: 0 % (ref 0.0–0.2)

## 2020-05-24 LAB — POCT I-STAT, CHEM 8
BUN: 16 mg/dL (ref 6–20)
Calcium, Ion: 1.1 mmol/L — ABNORMAL LOW (ref 1.15–1.40)
Chloride: 98 mmol/L (ref 98–111)
Creatinine, Ser: 0.9 mg/dL (ref 0.44–1.00)
Glucose, Bld: 272 mg/dL — ABNORMAL HIGH (ref 70–99)
HCT: 40 % (ref 36.0–46.0)
Hemoglobin: 13.6 g/dL (ref 12.0–15.0)
Potassium: 3.2 mmol/L — ABNORMAL LOW (ref 3.5–5.1)
Sodium: 137 mmol/L (ref 135–145)
TCO2: 25 mmol/L (ref 22–32)

## 2020-05-24 LAB — BASIC METABOLIC PANEL
Anion gap: 12 (ref 5–15)
BUN: 12 mg/dL (ref 6–20)
CO2: 25 mmol/L (ref 22–32)
Calcium: 7.7 mg/dL — ABNORMAL LOW (ref 8.9–10.3)
Chloride: 100 mmol/L (ref 98–111)
Creatinine, Ser: 1.25 mg/dL — ABNORMAL HIGH (ref 0.44–1.00)
GFR, Estimated: 53 mL/min — ABNORMAL LOW (ref >60)
Glucose, Bld: 286 mg/dL — ABNORMAL HIGH (ref 70–99)
Potassium: 3.2 mmol/L — ABNORMAL LOW (ref 3.5–5.1)
Sodium: 137 mmol/L (ref 135–145)

## 2020-05-24 LAB — HIV ANTIBODY (ROUTINE TESTING W REFLEX): HIV Screen 4th Generation wRfx: NONREACTIVE

## 2020-05-24 LAB — SURGICAL PCR SCREEN
MRSA, PCR: NEGATIVE
Staphylococcus aureus: NEGATIVE

## 2020-05-24 LAB — GLUCOSE, CAPILLARY
Glucose-Capillary: 277 mg/dL — ABNORMAL HIGH (ref 70–99)
Glucose-Capillary: 246 mg/dL — ABNORMAL HIGH (ref 70–99)
Glucose-Capillary: 188 mg/dL — ABNORMAL HIGH (ref 70–99)
Glucose-Capillary: 199 mg/dL — ABNORMAL HIGH (ref 70–99)

## 2020-05-24 LAB — URINE CULTURE: Culture: NO GROWTH

## 2020-05-24 LAB — APTT: aPTT: 44 seconds — ABNORMAL HIGH (ref 24–36)

## 2020-05-24 LAB — PROTIME-INR
INR: 1.3 — ABNORMAL HIGH (ref 0.8–1.2)
Prothrombin Time: 15.6 seconds — ABNORMAL HIGH (ref 11.4–15.2)

## 2020-05-24 LAB — LACTIC ACID, PLASMA: Lactic Acid, Venous: 1.9 mmol/L (ref 0.5–1.9)

## 2020-05-24 MED ORDER — SODIUM CHLORIDE 0.9 % IR SOLN
Status: DC | PRN
  Administered 2020-05-23: 3000 mL

## 2020-05-24 MED ORDER — PHENYLEPHRINE HCL-NACL 10-0.9 MG/250ML-% IV SOLN
INTRAVENOUS | Status: DC | PRN
  Administered 2020-05-23: 80 ug/min via INTRAVENOUS

## 2020-05-24 MED ORDER — ACETAMINOPHEN 650 MG RE SUPP: 650.0000 mg | Freq: Four times a day (QID) | RECTAL | Status: DC | PRN

## 2020-05-24 MED ORDER — ORAL CARE MOUTH RINSE
15.0000 mL | OROMUCOSAL | Status: DC
  Administered 2020-05-24 – 2020-05-25 (×13): 15 mL via OROMUCOSAL

## 2020-05-24 MED ORDER — CHLORHEXIDINE GLUCONATE 0.12% ORAL RINSE (MEDLINE KIT)
15.0000 mL | Freq: Two times a day (BID) | OROMUCOSAL | Status: DC
  Administered 2020-05-24 – 2020-05-25 (×4): 15 mL via OROMUCOSAL

## 2020-05-24 MED ORDER — MIDAZOLAM 50MG/50ML (1MG/ML) PREMIX INFUSION
0.5000 mg/h | INTRAVENOUS | Status: DC
  Administered 2020-05-24: 1 mg/h via INTRAVENOUS
  Filled 2020-05-24 (×2): qty 50

## 2020-05-24 MED ORDER — SODIUM CHLORIDE 0.9 % IV SOLN
2.0000 g | Freq: Three times a day (TID) | INTRAVENOUS | Status: DC
  Administered 2020-05-24 – 2020-06-01 (×24): 2 g via INTRAVENOUS
  Filled 2020-05-24 (×24): qty 2

## 2020-05-24 MED ORDER — POTASSIUM CHLORIDE 10 MEQ/100ML IV SOLN
10.0000 meq | INTRAVENOUS | Status: AC
  Administered 2020-05-24 (×2): 10 meq via INTRAVENOUS
  Filled 2020-05-24 (×2): qty 100

## 2020-05-24 MED ORDER — INSULIN ASPART 100 UNIT/ML ~~LOC~~ SOLN
0.0000 [IU] | SUBCUTANEOUS | Status: DC
  Administered 2020-05-24: 3 [IU] via SUBCUTANEOUS
  Administered 2020-05-24: 5 [IU] via SUBCUTANEOUS
  Administered 2020-05-24: 3 [IU] via SUBCUTANEOUS
  Administered 2020-05-25: 2 [IU] via SUBCUTANEOUS
  Administered 2020-05-25 (×4): 3 [IU] via SUBCUTANEOUS
  Administered 2020-05-25: 5 [IU] via SUBCUTANEOUS
  Administered 2020-05-26: 3 [IU] via SUBCUTANEOUS
  Administered 2020-05-26: 8 [IU] via SUBCUTANEOUS
  Administered 2020-05-26: 3 [IU] via SUBCUTANEOUS
  Administered 2020-05-26: 8 [IU] via SUBCUTANEOUS
  Administered 2020-05-26: 3 [IU] via SUBCUTANEOUS
  Administered 2020-05-27: 8 [IU] via SUBCUTANEOUS
  Administered 2020-05-27: 2 [IU] via SUBCUTANEOUS
  Administered 2020-05-27 (×2): 5 [IU] via SUBCUTANEOUS
  Administered 2020-05-27: 11 [IU] via SUBCUTANEOUS
  Administered 2020-05-28: 5 [IU] via SUBCUTANEOUS
  Administered 2020-05-28 (×2): 3 [IU] via SUBCUTANEOUS
  Administered 2020-05-28 – 2020-05-29 (×4): 5 [IU] via SUBCUTANEOUS
  Administered 2020-05-29 (×2): 3 [IU] via SUBCUTANEOUS

## 2020-05-24 MED ORDER — PHENYLEPHRINE HCL-NACL 10-0.9 MG/250ML-% IV SOLN
0.0000 ug/min | INTRAVENOUS | Status: DC
  Administered 2020-05-24: 108 ug/min via INTRAVENOUS
  Administered 2020-05-24: 110 ug/min via INTRAVENOUS
  Administered 2020-05-24: 100 ug/min via INTRAVENOUS
  Administered 2020-05-24: 116 ug/min via INTRAVENOUS
  Administered 2020-05-24: 110 ug/min via INTRAVENOUS
  Administered 2020-05-24: 75 ug/min via INTRAVENOUS
  Administered 2020-05-24 (×4): 115 ug/min via INTRAVENOUS
  Administered 2020-05-24 (×2): 120 ug/min via INTRAVENOUS
  Administered 2020-05-24: 116 ug/min via INTRAVENOUS
  Administered 2020-05-25: 110 ug/min via INTRAVENOUS
  Administered 2020-05-25: 125 ug/min via INTRAVENOUS
  Administered 2020-05-25: 110 ug/min via INTRAVENOUS
  Administered 2020-05-25: 15 ug/min via INTRAVENOUS
  Administered 2020-05-25: 65 ug/min via INTRAVENOUS
  Administered 2020-05-25 (×2): 110 ug/min via INTRAVENOUS
  Administered 2020-05-26: 35 ug/min via INTRAVENOUS
  Filled 2020-05-24: qty 250
  Filled 2020-05-24: qty 500
  Filled 2020-05-24: qty 250
  Filled 2020-05-24: qty 500
  Filled 2020-05-24 (×7): qty 250
  Filled 2020-05-24: qty 500
  Filled 2020-05-24 (×6): qty 250

## 2020-05-24 MED ORDER — PHENYLEPHRINE HCL-NACL 10-0.9 MG/250ML-% IV SOLN
INTRAVENOUS | Status: AC
  Filled 2020-05-24: qty 250

## 2020-05-24 MED ORDER — SODIUM CHLORIDE 0.9 % IV SOLN: INTRAVENOUS | Status: DC

## 2020-05-24 MED ORDER — FENTANYL 2500MCG IN NS 250ML (10MCG/ML) PREMIX INFUSION
0.0000 ug/h | INTRAVENOUS | Status: DC
  Administered 2020-05-24: 50 ug/h via INTRAVENOUS
  Administered 2020-05-24 (×2): 325 ug/h via INTRAVENOUS
  Administered 2020-05-25: 350 ug/h via INTRAVENOUS
  Filled 2020-05-24 (×5): qty 250

## 2020-05-24 MED ORDER — CHLORHEXIDINE GLUCONATE CLOTH 2 % EX PADS
6.0000 | MEDICATED_PAD | Freq: Every day | CUTANEOUS | Status: DC
  Administered 2020-05-24 – 2020-06-10 (×14): 6 via TOPICAL

## 2020-05-24 MED ORDER — PROPOFOL 500 MG/50ML IV EMUL
INTRAVENOUS | Status: DC | PRN
  Administered 2020-05-23: 50 ug/kg/min via INTRAVENOUS

## 2020-05-24 MED ORDER — VANCOMYCIN HCL IN DEXTROSE 1-5 GM/200ML-% IV SOLN
1000.0000 mg | Freq: Two times a day (BID) | INTRAVENOUS | Status: DC
  Administered 2020-05-24 – 2020-05-25 (×3): 1000 mg via INTRAVENOUS
  Filled 2020-05-24 (×3): qty 200

## 2020-05-24 NOTE — Progress Notes (Signed)
Arrived to 4N19 from the OR with the following belongings- 1 Black purse with $203 cash, multiple credit cards, 1 sweatshirt, 1 shirt,  1 pajama pant, clog shoes, and a cellphone. Belongings secured in patients cabinet in room.

## 2020-05-24 NOTE — Progress Notes (Signed)
Pharmacy Antibiotic Note  Kristie Tapia is a 49 y.o. female admitted on 05/23/2020 with necrotizing soft tissue infection of buttock extending above pelvic wall into retroperitoneal space.  Pharmacy has been consulted for Cefepime and vancomycin dosing. Also on Clindamycin per MD.   S/p OR 12/20 for I&D of necrotic tissues.  WBC elevated, trending down at 20.9. Tm 101. SCr improving at 1.25 (baseline ~0.8).  Good UOP recorded. Will adjust antibiotics with improving renal function.    Plan: -Increase Cefepime to 2 gm IV Q 8 hours -Increase Vancomycin to 1000 mg IV Q 12 hrs.  -Monitor CBC, renal fx, cultures and clinical progress -Vanc levels as indicated   Height: 5\' 5"  (165.1 cm) Weight: 92.1 kg (203 lb 0.7 oz) IBW/kg (Calculated) : 57  Temp (24hrs), Avg:99.3 F (37.4 C), Min:98.8 F (37.1 C), Max:101 F (38.3 C)  Recent Labs  Lab 05/23/20 1549 05/23/20 1900 05/23/20 2354 05/24/20 0217  WBC 21.4*  --   --  20.9*  CREATININE 1.64*  --  0.90 1.25*  LATICACIDVEN  --  2.0*  --  1.9    Estimated Creatinine Clearance: 61 mL/min (A) (by C-G formula based on SCr of 1.25 mg/dL (H)).    Allergies  Allergen Reactions  . Sulfa Antibiotics Other (See Comments)    Burns the inside of my mouth; "blisters; leaves tongue and inside of mouth solid red"  . Tape Other (See Comments)    Blisters- USE PAPER TAPE ONLY  . Decongestant [Pseudoephedrine Hcl Er] Itching    ALL DECONGESTANTS  . Duloxetine Other (See Comments)    Felt "out of it"  . Fremanezumab-Vfrm Other (See Comments) and Nausea Only  . Tizanidine Other (See Comments)    headache  . Chlorpheniramine Itching  . Red Dye Other (See Comments)    CAUSES MIGRAINES  . Eggs Or Egg-Derived Products Nausea And Vomiting  . Latex Itching  . Percocet [Oxycodone-Acetaminophen] Other (See Comments)    PT reports having nightmares when taking Percocet  . Prednisone Other (See Comments)    "Gives me a bad attitude"  . Zoloft  [Sertraline Hcl] Rash    Antimicrobials this admission: Cefepime 12/20 >>  Vanc 12/20 >>  Clindamycin 12/20 >>  Dose adjustments this admission:  Microbiology results: 12/21 Buttock tissue >> 12/20 Urine >> 12/20 Blood >>  Thank you for allowing pharmacy to be a part of this patient's care.  1/21, PharmD, BCPS, BCCCP Clinical Pharmacist Please refer to [facility] for [facility] Pharm[facility] numbers 05/24/2020, 8:06 AM

## 2020-05-24 NOTE — Anesthesia Preprocedure Evaluation (Addendum)
Anesthesia Evaluation  Patient identified by MRN, date of birth, ID band Patient unresponsive    Reviewed: Allergy & Precautions, NPO status , Patient's Chart, lab work & pertinent test results  Airway Mallampati: Intubated       Dental  (+) Teeth Intact, Edentulous Upper, Edentulous Lower   Pulmonary Current Smoker,    breath sounds clear to auscultation       Cardiovascular Exercise Tolerance: Good Normal cardiovascular exam Rhythm:Regular Rate:Normal  12/2017 Echo Left ventricle: The cavity size was normal. Wall thickness was  increased in a pattern of mild LVH. Systolic function was normal.  The estimated ejection fraction was in the range of 55% to 60%.  Wall motion was normal; there were no regional wall motion  abnormalities. Doppler parameters are consistent with abnormal  left ventricular relaxation (grade 1 diastolic dysfunction). The  E/e&' ratio is <8, suggesting normal LV filling pressure.  - Left atrium: The atrium was normal in size.    Neuro/Psych Anxiety CVA    GI/Hepatic Neg liver ROS, hiatal hernia, GERD  ,  Endo/Other  diabetes  Renal/GU K+ 3.2 Cr 1.25     Musculoskeletal  (+) Fibromyalgia -  Abdominal (+) + obese,   Peds  Hematology  (+) anemia , Hgb 14.0    Anesthesia Other Findings All: see list  Reproductive/Obstetrics                          Anesthesia Physical Anesthesia Plan  ASA: IV  Anesthesia Plan: General   Post-op Pain Management:    Induction: Inhalational  PONV Risk Score and Plan: Treatment may vary due to age or medical condition  Airway Management Planned: Oral ETT  Additional Equipment: None  Intra-op Plan:   Post-operative Plan: Post-operative intubation/ventilation  Informed Consent: I have reviewed the patients History and Physical, chart, labs and discussed the procedure including the risks, benefits and alternatives for  the proposed anesthesia with the patient or authorized representative who has indicated his/her understanding and acceptance.     History available from chart only  Plan Discussed with: CRNA and Anesthesiologist  Anesthesia Plan Comments:        Anesthesia Quick Evaluation

## 2020-05-24 NOTE — Transfer of Care (Signed)
Immediate Anesthesia Transfer of Care Note  Patient: Kristie Tapia  Procedure(s) Performed: IRRIGATION AND DEBRIDEMENT BUTTOCKS ABSCESS (N/A Buttocks)  Patient Location: PACU  Anesthesia Type:General  Level of Consciousness: Patient remains intubated per anesthesia plan  Airway & Oxygen Therapy: Patient remains intubated per anesthesia plan and Patient placed on Ventilator (see vital sign flow sheet for setting)  Post-op Assessment: Report given to RN and Post -op Vital signs reviewed and stable  Post vital signs: Reviewed  Last Vitals:  Vitals Value Taken Time  BP 142/79 05/24/20 0030  Temp    Pulse 83 05/24/20 0032  Resp 15 05/24/20 0032  SpO2 100 % 05/24/20 0032  Vitals shown include unvalidated device data.  Last Pain:  Vitals:   05/23/20 2106  TempSrc: Oral  PainSc:          Complications: No complications documented.

## 2020-05-24 NOTE — Anesthesia Procedure Notes (Signed)
Procedure Name: Intubation Date/Time: 05/23/2020 11:06 PM Performed by: Khristen Cheyney T, CRNA Pre-anesthesia Checklist: Patient identified, Emergency Drugs available, Suction available and Patient being monitored Patient Re-evaluated:Patient Re-evaluated prior to induction Oxygen Delivery Method: Circle system utilized Preoxygenation: Pre-oxygenation with 100% oxygen Induction Type: IV induction Ventilation: Mask ventilation without difficulty Laryngoscope Size: Miller and 2 Grade View: Grade I Tube type: Oral Tube size: 7.5 mm Number of attempts: 1 Airway Equipment and Method: Stylet and Oral airway Placement Confirmation: ETT inserted through vocal cords under direct vision,  positive ETCO2 and breath sounds checked- equal and bilateral Secured at: 21 cm Tube secured with: Tape Dental Injury: Teeth and Oropharynx as per pre-operative assessment

## 2020-05-24 NOTE — Progress Notes (Signed)
Inpatient Diabetes Program Recommendations  AACE/ADA: New Consensus Statement on Inpatient Glycemic Control (2015)  Target Ranges:  Prepandial:   less than 140 mg/dL      Peak postprandial:   less than 180 mg/dL (1-2 hours)      Critically ill patients:  140 - 180 mg/dL   Lab Results  Component Value Date   GLUCAP 277 (H) 05/24/2020   HGBA1C 8.7 (A) 08/19/2018    Review of Glycemic Control Results for JHORDYN, HOOPINGARNER (MRN [id number]) as of 05/24/2020 13:02  Ref. Range 05/24/2020 01:11  Glucose-Capillary Latest Ref Range: 70 - 99 mg/dL 132 (H)  Results for CHASTIDY, RANKER (MRN [id number]) as of 05/24/2020 13:02  Ref. Range 05/23/2020 15:49 05/23/2020 23:54 05/24/2020 02:17  Glucose Latest Ref Range: 70 - 99 mg/dL 366 (H) 440 (H) 347 (H)   Diabetes history:  DM2 Outpatient Diabetes medications:  Actos 45 units daily Humalog 10-15 units BID Current orders for Inpatient glycemic control: None  Inpatient Diabetes Program Recommendations:    Glycemic control order set: Novolog 0-15 units Q4H Obtain current A1C  Will continue to follow while inpatient.  Thank you, Dulce Sellar, RN, BSN Diabetes Coordinator Inpatient Diabetes Program [phone] (team pager from 8a-5p)

## 2020-05-24 NOTE — Progress Notes (Signed)
Patient ID: Kristie Tapia, female   DOB: 04-16-1971, 49 y.o.   MRN: [id number] Follow up - Trauma Critical Care  Patient Details:    Kristie Tapia is an 49 y.o. female.  Lines/tubes : Airway 7.5 mm (Active)  Secured at (cm) 23 cm 05/24/20 0754  Measured From Lips 05/24/20 0754  Secured Location Center 05/24/20 0754  Secured By [employer] 05/24/20 0754  Tube Holder Repositioned Yes 05/24/20 0754  Prone position No 05/24/20 0302  Head position Left 05/24/20 0302  Cuff Pressure (cm H2O) 29 cm H2O 05/24/20 0754  Site Condition Dry 05/24/20 0754     Urethral Catheter C. Mitzie Na, RN Non-latex;Straight-tip 16 Fr. (Active)  Indication for Insertion or Continuance of Catheter Peri-operative use for selective surgical procedure - not to exceed 24 hours post-op 05/24/20 0030  Site Assessment Clean;Intact 05/24/20 0030  Catheter Maintenance Bag below level of bladder;Catheter secured;Drainage bag/tubing not touching floor;Insertion date on drainage bag;No dependent loops;Seal intact 05/24/20 0030  Collection Container Dedicated Suction Canister 05/24/20 0030  Securement Method Securing device (Describe) 05/24/20 0030  Urinary Catheter Interventions (if applicable) Unclamped 05/24/20 0030  Output (mL) 1200 mL 05/24/20 0100    Microbiology/Sepsis markers: Results for orders placed or performed during the hospital encounter of 05/23/20  Blood Culture (routine x 2)     Status: None (Preliminary result)   Collection Time: 05/23/20  6:28 PM   Specimen: BLOOD LEFT ARM  Result Value Ref Range Status   Specimen Description BLOOD LEFT ARM  Final   Special Requests   Final    BOTTLES DRAWN AEROBIC AND ANAEROBIC Blood Culture results may not be optimal due to an inadequate volume of blood received in culture bottles   Culture   Final    NO GROWTH < 12 HOURS Performed at [facility], [address] [place], [place] [address]    Report Status PENDING  Incomplete  Resp Panel  by RT-PCR (Flu A&B, Covid) Nasopharyngeal Swab     Status: None   Collection Time: 05/23/20  6:54 PM   Specimen: Nasopharyngeal Swab; Nasopharyngeal(NP) swabs in vial transport medium  Result Value Ref Range Status   SARS Coronavirus 2 by RT PCR NEGATIVE NEGATIVE Final    Comment: (NOTE) SARS-CoV-2 target nucleic acids are NOT DETECTED.  The SARS-CoV-2 RNA is generally detectable in upper respiratory specimens during the acute phase of infection. The lowest concentration of SARS-CoV-2 viral copies this assay can detect is 138 copies/mL. A negative result does not preclude SARS-Cov-2 infection and should not be used as the sole basis for treatment or other patient management decisions. A negative result may occur with  improper specimen collection/handling, submission of specimen other than nasopharyngeal swab, presence of viral mutation(s) within the areas targeted by this assay, and inadequate number of viral copies(<138 copies/mL). A negative result must be combined with clinical observations, patient history, and epidemiological information. The expected result is Negative.  Fact Sheet for Patients:  BloggerCourse.com  Fact Sheet for Healthcare Providers:  SeriousBroker.it  This test is no t yet approved or cleared by the [place] FDA and  has been authorized for detection and/or diagnosis of SARS-CoV-2 by FDA under an Emergency Use Authorization (EUA). This EUA will remain  in effect (meaning this test can be used) for the duration of the COVID-19 declaration under Section 564(b)(1) of the Act, 21 U.S.C.section 360bbb-3(b)(1), unless the authorization is terminated  or revoked sooner.       Influenza A by PCR NEGATIVE  NEGATIVE Final   Influenza B by PCR NEGATIVE NEGATIVE Final    Comment: (NOTE) The Xpert Xpress SARS-CoV-2/FLU/RSV plus assay is intended as an aid in the diagnosis of influenza from Nasopharyngeal swab  specimens and should not be used as a sole basis for treatment. Nasal washings and aspirates are unacceptable for Xpert Xpress SARS-CoV-2/FLU/RSV testing.  Fact Sheet for Patients: BloggerCourse.com  Fact Sheet for Healthcare Providers: SeriousBroker.it  This test is not yet approved or cleared by the [place] FDA and has been authorized for detection and/or diagnosis of SARS-CoV-2 by FDA under an Emergency Use Authorization (EUA). This EUA will remain in effect (meaning this test can be used) for the duration of the COVID-19 declaration under Section 564(b)(1) of the Act, 21 U.S.C. section 360bbb-3(b)(1), unless the authorization is terminated or revoked.  Performed at [facility], [address], [place], [place] [address]   Blood Culture (routine x 2)     Status: None (Preliminary result)   Collection Time: 05/23/20  6:58 PM   Specimen: BLOOD LEFT HAND  Result Value Ref Range Status   Specimen Description BLOOD LEFT HAND  Final   Special Requests   Final    BOTTLES DRAWN AEROBIC AND ANAEROBIC Blood Culture results may not be optimal due to an inadequate volume of blood received in culture bottles   Culture   Final    NO GROWTH < 12 HOURS Performed at [facility], [address] [place], [place] [address]    Report Status PENDING  Incomplete  Aerobic/Anaerobic Culture (surgical/deep wound)     Status: None (Preliminary result)   Collection Time: 05/24/20 12:10 AM   Specimen: PATH Other; Tissue  Result Value Ref Range Status   Specimen Description BUTTOCKS  Final   Special Requests NONE  Final   Gram Stain   Final    RARE WBC PRESENT, PREDOMINANTLY PMN FEW GRAM NEGATIVE RODS RARE GRAM POSITIVE COCCI RARE GRAM POSITIVE RODS Performed at [facility], [address], [place], [place] [address]    Culture PENDING  Incomplete   Report Status PENDING  Incomplete    Anti-infectives:   Anti-infectives (From admission, onward)   Start     Dose/Rate Route Frequency Ordered Stop   05/24/20 1800  vancomycin (VANCOREADY) IVPB 750 mg/150 mL  Status:  Discontinued        750 mg 150 mL/hr over 60 Minutes Intravenous Every 24 hours 05/23/20 1914 05/24/20 0811   05/24/20 1400  ceFEPIme (MAXIPIME) 2 g in sodium chloride 0.9 % 100 mL IVPB        2 g 200 mL/hr over 30 Minutes Intravenous Every 8 hours 05/24/20 0811     05/24/20 0900  vancomycin (VANCOCIN) IVPB 1000 mg/200 mL premix        1,000 mg 200 mL/hr over 60 Minutes Intravenous Every 12 hours 05/24/20 0811     05/24/20 0800  ceFEPIme (MAXIPIME) 2 g in sodium chloride 0.9 % 100 mL IVPB  Status:  Discontinued        2 g 200 mL/hr over 30 Minutes Intravenous Every 12 hours 05/23/20 1914 05/24/20 0811   05/23/20 2200  clindamycin (CLEOCIN) IVPB 600 mg        600 mg 100 mL/hr over 30 Minutes Intravenous Every 8 hours 05/23/20 2129     05/23/20 1830  vancomycin (VANCOREADY) IVPB 1500 mg/300 mL        1,500 mg 150 mL/hr over 120 Minutes Intravenous  Once 05/23/20 1813 05/23/20 2229   05/23/20 1800  ceFEPIme (MAXIPIME) 2 g in sodium chloride 0.9 % 100 mL IVPB        2 g 200 mL/hr over 30 Minutes Intravenous  Once 05/23/20 1750 05/23/20 1934   05/23/20 1800  metroNIDAZOLE (FLAGYL) IVPB 500 mg        500 mg 100 mL/hr over 60 Minutes Intravenous  Once 05/23/20 1750 05/23/20 2027   05/23/20 1800  vancomycin (VANCOCIN) IVPB 1000 mg/200 mL premix  Status:  Discontinued        1,000 mg 200 mL/hr over 60 Minutes Intravenous  Once 05/23/20 1750 05/23/20 1813      Best Practice/Protocols:  VTE Prophylaxis: Lovenox (prophylaxtic dose) Continous Sedation  Consults:     Studies:    Events:  Subjective:    Overnight Issues:   Objective:  Vital signs for last 24 hours: Temp:  [98.8 F (37.1 C)-101 F (38.3 C)] 98.9 F (37.2 C) (12/21 0759) Pulse Rate:  [64-98] 65 (12/21 0754) Resp:  [14-22] 14 (12/21 0754) BP:  (69-142)/(39-107) 93/48 (12/21 0615) SpO2:  [94 %-100 %] 99 % (12/21 0754) FiO2 (%):  [40 %-50 %] 40 % (12/21 0754) Weight:  [87.1 kg-92.1 kg] 92.1 kg (12/21 0200)  Hemodynamic parameters for last 24 hours:    Intake/Output from previous day: 12/20 0701 - 12/21 0700 In: 6639.9 [I.V.:2939.4; IV Piggyback:3700.4] Out: 1200 [Urine:1200]  Intake/Output this shift: No intake/output data recorded.  Vent settings for last 24 hours: Vent Mode: PRVC FiO2 (%):  [40 %-50 %] 40 % Set Rate:  [14 bmp] 14 bmp Vt Set:  [550 mL] 550 mL PEEP:  [5 cmH20] 5 cmH20 Plateau Pressure:  [15 cmH20-18 cmH20] 18 cmH20  Physical Exam:  General: on vent Neuro: F/C HEENT/Neck: ETT Resp: clear to auscultation bilaterally CVS: RRR GI: soft, no tenderness Extremities: PAS Buttock dressing with some betadine stained fluid  Results for orders placed or performed during the hospital encounter of 05/23/20 (from the past 24 hour(s))  Lipase, blood     Status: None   Collection Time: 05/23/20  3:49 PM  Result Value Ref Range   Lipase 17 11 - 51 U/L  Comprehensive metabolic panel     Status: Abnormal   Collection Time: 05/23/20  3:49 PM  Result Value Ref Range   Sodium 129 (L) 135 - 145 mmol/L   Potassium 3.2 (L) 3.5 - 5.1 mmol/L   Chloride 92 (L) 98 - 111 mmol/L   CO2 22 22 - 32 mmol/L   Glucose, Bld 490 (H) 70 - 99 mg/dL   BUN 18 6 - 20 mg/dL   Creatinine, Ser 1.611.64 (H) 0.44 - 1.00 mg/dL   Calcium 7.9 (L) 8.9 - 10.3 mg/dL   Total Protein 5.9 (L) 6.5 - 8.1 g/dL   Albumin 2.7 (L) 3.5 - 5.0 g/dL   AST 14 (L) 15 - 41 U/L   ALT 30 0 - 44 U/L   Alkaline Phosphatase 133 (H) 38 - 126 U/L   Total Bilirubin 1.2 0.3 - 1.2 mg/dL   GFR, Estimated 38 (L) >60 mL/min   Anion gap 15 5 - 15  CBC     Status: Abnormal   Collection Time: 05/23/20  3:49 PM  Result Value Ref Range   WBC 21.4 (H) 4.0 - 10.5 K/uL   RBC 4.86 3.87 - 5.11 MIL/uL   Hemoglobin 15.1 (H) 12.0 - 15.0 g/dL   HCT 09.643.3 04.536.0 - 40.946.0 %   MCV  89.1  80.0 - 100.0 fL   MCH 31.1 26.0 - 34.0 pg   MCHC 34.9 30.0 - 36.0 g/dL   RDW 64.4 03.4 - 74.2 %   Platelets 289 150 - 400 K/uL   nRBC 0.0 0.0 - 0.2 %  I-Stat beta hCG blood, ED     Status: None   Collection Time: 05/23/20  4:00 PM  Result Value Ref Range   I-stat hCG, quantitative <5.0 <5 mIU/mL   Comment 3          Blood Culture (routine x 2)     Status: None (Preliminary result)   Collection Time: 05/23/20  6:28 PM   Specimen: BLOOD LEFT ARM  Result Value Ref Range   Specimen Description BLOOD LEFT ARM    Special Requests      BOTTLES DRAWN AEROBIC AND ANAEROBIC Blood Culture results may not be optimal due to an inadequate volume of blood received in culture bottles   Culture      NO GROWTH < 12 HOURS Performed at [facility], [address] [place], [place] [address]    Report Status PENDING   Resp Panel by RT-PCR (Flu A&B, Covid) Nasopharyngeal Swab     Status: None   Collection Time: 05/23/20  6:54 PM   Specimen: Nasopharyngeal Swab; Nasopharyngeal(NP) swabs in vial transport medium  Result Value Ref Range   SARS Coronavirus 2 by RT PCR NEGATIVE NEGATIVE   Influenza A by PCR NEGATIVE NEGATIVE   Influenza B by PCR NEGATIVE NEGATIVE  Blood Culture (routine x 2)     Status: None (Preliminary result)   Collection Time: 05/23/20  6:58 PM   Specimen: BLOOD LEFT HAND  Result Value Ref Range   Specimen Description BLOOD LEFT HAND    Special Requests      BOTTLES DRAWN AEROBIC AND ANAEROBIC Blood Culture results may not be optimal due to an inadequate volume of blood received in culture bottles   Culture      NO GROWTH < 12 HOURS Performed at [facility], [address], [place], [place] [address]    Report Status PENDING   Lactic acid, plasma     Status: Abnormal   Collection Time: 05/23/20  7:00 PM  Result Value Ref Range   Lactic Acid, Venous 2.0 (HH) 0.5 - 1.9 mmol/L  Urinalysis, Routine w reflex microscopic Urine, Clean Catch     Status: Abnormal    Collection Time: 05/23/20  8:35 PM  Result Value Ref Range   Color, Urine YELLOW YELLOW   APPearance CLOUDY (A) CLEAR   Specific Gravity, Urine 1.010 1.005 - 1.030   pH 5.0 5.0 - 8.0   Glucose, UA >=500 (A) NEGATIVE mg/dL   Hgb urine dipstick NEGATIVE NEGATIVE   Bilirubin Urine NEGATIVE NEGATIVE   Ketones, ur NEGATIVE NEGATIVE mg/dL   Protein, ur NEGATIVE NEGATIVE mg/dL   Nitrite NEGATIVE NEGATIVE   Leukocytes,Ua NEGATIVE NEGATIVE   RBC / HPF 0-5 0 - 5 RBC/hpf   WBC, UA 6-10 0 - 5 WBC/hpf   Bacteria, UA FEW (A) NONE SEEN   Squamous Epithelial / LPF 21-50 0 - 5   Mucus PRESENT    Amorphous Crystal PRESENT   I-STAT, chem 8     Status: Abnormal   Collection Time: 05/23/20 11:54 PM  Result Value Ref Range   Sodium 137 135 - 145 mmol/L   Potassium 3.2 (L) 3.5 - 5.1 mmol/L   Chloride 98 98 - 111 mmol/L  BUN 16 6 - 20 mg/dL   Creatinine, Ser 4.09 0.44 - 1.00 mg/dL   Glucose, Bld 811 (H) 70 - 99 mg/dL   Calcium, Ion 9.14 (L) 1.15 - 1.40 mmol/L   TCO2 25 22 - 32 mmol/L   Hemoglobin 13.6 12.0 - 15.0 g/dL   HCT 78.2 95.6 - 21.3 %  Aerobic/Anaerobic Culture (surgical/deep wound)     Status: None (Preliminary result)   Collection Time: 05/24/20 12:10 AM   Specimen: PATH Other; Tissue  Result Value Ref Range   Specimen Description BUTTOCKS    Special Requests NONE    Gram Stain      RARE WBC PRESENT, PREDOMINANTLY PMN FEW GRAM NEGATIVE RODS RARE GRAM POSITIVE COCCI RARE GRAM POSITIVE RODS Performed at [facility], [address], [place], [place] [address]    Culture PENDING    Report Status PENDING   Glucose, capillary     Status: Abnormal   Collection Time: 05/24/20  1:11 AM  Result Value Ref Range   Glucose-Capillary 277 (H) 70 - 99 mg/dL  Lactic acid, plasma     Status: None   Collection Time: 05/24/20  2:17 AM  Result Value Ref Range   Lactic Acid, Venous 1.9 0.5 - 1.9 mmol/L  Protime-INR     Status: Abnormal   Collection Time: 05/24/20  2:17 AM  Result Value  Ref Range   Prothrombin Time 15.6 (H) 11.4 - 15.2 seconds   INR 1.3 (H) 0.8 - 1.2  APTT     Status: Abnormal   Collection Time: 05/24/20  2:17 AM  Result Value Ref Range   aPTT 44 (H) 24 - 36 seconds  Basic metabolic panel     Status: Abnormal   Collection Time: 05/24/20  2:17 AM  Result Value Ref Range   Sodium 137 135 - 145 mmol/L   Potassium 3.2 (L) 3.5 - 5.1 mmol/L   Chloride 100 98 - 111 mmol/L   CO2 25 22 - 32 mmol/L   Glucose, Bld 286 (H) 70 - 99 mg/dL   BUN 12 6 - 20 mg/dL   Creatinine, Ser 8.46 (H) 0.44 - 1.00 mg/dL   Calcium 7.7 (L) 8.9 - 10.3 mg/dL   GFR, Estimated 53 (L) >60 mL/min   Anion gap 12 5 - 15  CBC     Status: Abnormal   Collection Time: 05/24/20  2:17 AM  Result Value Ref Range   WBC 20.9 (H) 4.0 - 10.5 K/uL   RBC 4.52 3.87 - 5.11 MIL/uL   Hemoglobin 14.0 12.0 - 15.0 g/dL   HCT 96.2 95.2 - 84.1 %   MCV 89.4 80.0 - 100.0 fL   MCH 31.0 26.0 - 34.0 pg   MCHC 34.7 30.0 - 36.0 g/dL   RDW 32.4 (H) 40.1 - 02.7 %   Platelets 304 150 - 400 K/uL   nRBC 0.0 0.0 - 0.2 %    Assessment & Plan: Present on Admission: . Necrotizing soft tissue infection    LOS: 1 day   Additional comments:I reviewed the patient's new clinical lab test results. . Buttock and retroperitoneal abscess - S/P debridement by Dr. Derrell Lolling 12/20. Plan to go back to the OR tomorrow with Dr. Derrell Lolling for further debridement as needed.  Acute hypoxic ventilator dependent respiratory failure - support as going to OR. Plan wean to extubate after FEN - hypokalemia - replete ID - Vanc/maxipime,clinda. Further surgery as above. VTE - Lovenox Dispo - ICU Critical Care Total Time*: 33  Minutes  Violeta Gelinas, MD, MPH, FACS Trauma & General Surgery Use [email or web address] to contact on call provider  05/24/2020  *Care during the described time interval was provided by me. I have reviewed this patient's available data, including medical history, events of note, physical examination and test results as  part of my evaluation.

## 2020-05-24 NOTE — Anesthesia Postprocedure Evaluation (Signed)
Anesthesia Post Note  Patient: Kristie Tapia  Procedure(s) Performed: IRRIGATION AND DEBRIDEMENT BUTTOCKS ABSCESS (N/A Buttocks)     Patient location during evaluation: SICU Anesthesia Type: General Level of consciousness: sedated Pain management: pain level controlled Vital Signs Assessment: post-procedure vital signs reviewed and stable Respiratory status: patient remains intubated per anesthesia plan Cardiovascular status: stable Postop Assessment: no apparent nausea or vomiting Anesthetic complications: no   No complications documented.  Last Vitals:  Vitals:   05/23/20 2034 05/23/20 2106  BP: 115/60 (!) 112/49  Pulse: 94 85  Resp: 17 19  Temp:  37.1 C  SpO2: 95% 95%    Last Pain:  Vitals:   05/23/20 2106  TempSrc: Oral  PainSc:                  Legion Discher DAVID

## 2020-05-25 ENCOUNTER — Inpatient Hospital Stay (HOSPITAL_COMMUNITY): Payer: Medicare Other | Admitting: Certified Registered Nurse Anesthetist

## 2020-05-25 ENCOUNTER — Encounter (HOSPITAL_COMMUNITY): Admission: EM | Disposition: A | Discharge status: Home Health | Payer: Self-pay | Source: Home / Self Care

## 2020-05-25 HISTORY — PX: INCISION AND DRAINAGE ABSCESS: SHX5864

## 2020-05-25 LAB — BASIC METABOLIC PANEL
Anion gap: 9 (ref 5–15)
BUN: 14 mg/dL (ref 6–20)
CO2: 23 mmol/L (ref 22–32)
Calcium: 6.9 mg/dL — ABNORMAL LOW (ref 8.9–10.3)
Chloride: 107 mmol/L (ref 98–111)
Creatinine, Ser: 1.12 mg/dL — ABNORMAL HIGH (ref 0.44–1.00)
GFR, Estimated: >60 mL/min (ref >60)
Glucose, Bld: 186 mg/dL — ABNORMAL HIGH (ref 70–99)
Potassium: 3.4 mmol/L — ABNORMAL LOW (ref 3.5–5.1)
Sodium: 139 mmol/L (ref 135–145)

## 2020-05-25 LAB — GLUCOSE, CAPILLARY
Glucose-Capillary: 173 mg/dL — ABNORMAL HIGH (ref 70–99)
Glucose-Capillary: 157 mg/dL — ABNORMAL HIGH (ref 70–99)
Glucose-Capillary: 146 mg/dL — ABNORMAL HIGH (ref 70–99)
Glucose-Capillary: 158 mg/dL — ABNORMAL HIGH (ref 70–99)
Glucose-Capillary: 173 mg/dL — ABNORMAL HIGH (ref 70–99)
Glucose-Capillary: 244 mg/dL — ABNORMAL HIGH (ref 70–99)

## 2020-05-25 LAB — CBC
HCT: 40.1 % (ref 36.0–46.0)
Hemoglobin: 12.9 g/dL (ref 12.0–15.0)
MCH: 29.5 pg (ref 26.0–34.0)
MCHC: 32.2 g/dL (ref 30.0–36.0)
MCV: 91.6 fL (ref 80.0–100.0)
Platelets: 371 10*3/uL (ref 150–400)
RBC: 4.38 MIL/uL (ref 3.87–5.11)
RDW: 16.1 % — ABNORMAL HIGH (ref 11.5–15.5)
WBC: 18.4 10*3/uL — ABNORMAL HIGH (ref 4.0–10.5)
nRBC: 0.2 % (ref 0.0–0.2)

## 2020-05-25 LAB — HEMOGLOBIN A1C
Hgb A1c MFr Bld: 8.9 % — ABNORMAL HIGH (ref 4.8–5.6)
Mean Plasma Glucose: 208.73 mg/dL

## 2020-05-25 SURGERY — INCISION AND DRAINAGE, ABSCESS
Anesthesia: General | Site: Buttocks

## 2020-05-25 MED ORDER — FENTANYL CITRATE (PF) 100 MCG/2ML IJ SOLN: 50.0000 ug | INTRAMUSCULAR | Status: DC | PRN

## 2020-05-25 MED ORDER — DEXAMETHASONE SODIUM PHOSPHATE 10 MG/ML IJ SOLN
INTRAMUSCULAR | Status: AC
  Filled 2020-05-25: qty 1

## 2020-05-25 MED ORDER — DEXAMETHASONE SODIUM PHOSPHATE 10 MG/ML IJ SOLN
INTRAMUSCULAR | Status: DC | PRN
  Administered 2020-05-25: 5 mg via INTRAVENOUS

## 2020-05-25 MED ORDER — MIDAZOLAM HCL 2 MG/2ML IJ SOLN
INTRAMUSCULAR | Status: DC | PRN
  Administered 2020-05-25: 2 mg via INTRAVENOUS

## 2020-05-25 MED ORDER — POTASSIUM CHLORIDE 10 MEQ/100ML IV SOLN: 10.0000 meq | Freq: Once | INTRAVENOUS | Status: DC

## 2020-05-25 MED ORDER — FENTANYL CITRATE (PF) 250 MCG/5ML IJ SOLN
INTRAMUSCULAR | Status: AC
  Filled 2020-05-25: qty 5

## 2020-05-25 MED ORDER — LACTATED RINGERS IV SOLN: INTRAVENOUS | Status: DC | PRN

## 2020-05-25 MED ORDER — PHENYLEPHRINE 40 MCG/ML (10ML) SYRINGE FOR IV PUSH (FOR BLOOD PRESSURE SUPPORT)
PREFILLED_SYRINGE | INTRAVENOUS | Status: DC | PRN
  Administered 2020-05-25: 80 ug via INTRAVENOUS

## 2020-05-25 MED ORDER — SUGAMMADEX SODIUM 200 MG/2ML IV SOLN
INTRAVENOUS | Status: DC | PRN
  Administered 2020-05-25: 200 mg via INTRAVENOUS

## 2020-05-25 MED ORDER — VANCOMYCIN HCL IN DEXTROSE 1-5 GM/200ML-% IV SOLN
1000.0000 mg | Freq: Two times a day (BID) | INTRAVENOUS | Status: DC
  Administered 2020-05-25 – 2020-05-30 (×10): 1000 mg via INTRAVENOUS
  Filled 2020-05-25 (×10): qty 200

## 2020-05-25 MED ORDER — ONDANSETRON HCL 4 MG/2ML IJ SOLN
INTRAMUSCULAR | Status: DC | PRN
  Administered 2020-05-25: 4 mg via INTRAVENOUS

## 2020-05-25 MED ORDER — 0.9 % SODIUM CHLORIDE (POUR BTL) OPTIME
TOPICAL | Status: DC | PRN
  Administered 2020-05-25: 1000 mL

## 2020-05-25 MED ORDER — MIDAZOLAM HCL 2 MG/2ML IJ SOLN
INTRAMUSCULAR | Status: AC
  Filled 2020-05-25: qty 2

## 2020-05-25 MED ORDER — PROPOFOL 10 MG/ML IV BOLUS
INTRAVENOUS | Status: DC | PRN
Start: 2020-05-25 — End: 2020-05-25
  Administered 2020-05-25: 70 mg via INTRAVENOUS

## 2020-05-25 MED ORDER — ONDANSETRON HCL 4 MG/2ML IJ SOLN
INTRAMUSCULAR | Status: AC
  Filled 2020-05-25: qty 2

## 2020-05-25 MED ORDER — EPHEDRINE SULFATE-NACL 50-0.9 MG/10ML-% IV SOSY
PREFILLED_SYRINGE | INTRAVENOUS | Status: DC | PRN
  Administered 2020-05-25: 10 mg via INTRAVENOUS

## 2020-05-25 MED ORDER — ROCURONIUM BROMIDE 10 MG/ML (PF) SYRINGE
PREFILLED_SYRINGE | INTRAVENOUS | Status: AC
  Filled 2020-05-25: qty 10

## 2020-05-25 MED ORDER — PROPOFOL 10 MG/ML IV BOLUS
INTRAVENOUS | Status: AC
  Filled 2020-05-25: qty 20

## 2020-05-25 MED ORDER — HYDROMORPHONE HCL 1 MG/ML IJ SOLN
1.0000 mg | INTRAMUSCULAR | Status: DC | PRN
  Administered 2020-05-25 – 2020-05-27 (×4): 1 mg via INTRAVENOUS
  Filled 2020-05-25 (×4): qty 1

## 2020-05-25 MED ORDER — ROCURONIUM BROMIDE 10 MG/ML (PF) SYRINGE
PREFILLED_SYRINGE | INTRAVENOUS | Status: DC | PRN
  Administered 2020-05-25: 50 mg via INTRAVENOUS

## 2020-05-25 SURGICAL SUPPLY — 28 items
BNDG GAUZE ELAST 4 BULKY (GAUZE/BANDAGES/DRESSINGS) ×1 IMPLANT
CANISTER SUCT 3000ML PPV (MISCELLANEOUS) ×2 IMPLANT
COVER SURGICAL LIGHT HANDLE (MISCELLANEOUS) ×2 IMPLANT
COVER WAND RF STERILE (DRAPES) ×2 IMPLANT
DRAPE LAPAROSCOPIC ABDOMINAL (DRAPES) IMPLANT
DRSG PAD ABDOMINAL 8X10 ST (GAUZE/BANDAGES/DRESSINGS) ×1 IMPLANT
ELECT CAUTERY BLADE 6.4 (BLADE) ×2 IMPLANT
ELECT REM PT RETURN 9FT ADLT (ELECTROSURGICAL) ×2
ELECTRODE REM PT RTRN 9FT ADLT (ELECTROSURGICAL) ×1 IMPLANT
GAUZE SPONGE 4X4 12PLY STRL (GAUZE/BANDAGES/DRESSINGS) IMPLANT
GLOVE BIO SURGEON STRL SZ7.5 (GLOVE) ×2 IMPLANT
GLOVE BIOGEL PI IND STRL 8 (GLOVE) ×1 IMPLANT
GLOVE BIOGEL PI INDICATOR 8 (GLOVE) ×1
GOWN STRL REUS W/ TWL LRG LVL3 (GOWN DISPOSABLE) ×1 IMPLANT
GOWN STRL REUS W/TWL LRG LVL3 (GOWN DISPOSABLE) ×2
HANDPIECE INTERPULSE COAX TIP (DISPOSABLE)
KIT BASIN OR (CUSTOM PROCEDURE TRAY) ×2 IMPLANT
KIT TURNOVER KIT B (KITS) ×2 IMPLANT
NS IRRIG 1000ML POUR BTL (IV SOLUTION) ×2 IMPLANT
PACK GENERAL/GYN (CUSTOM PROCEDURE TRAY) ×2 IMPLANT
PAD ARMBOARD 7.5X6 YLW CONV (MISCELLANEOUS) ×2 IMPLANT
PENCIL SMOKE EVACUATOR (MISCELLANEOUS) ×2 IMPLANT
SET HNDPC FAN SPRY TIP SCT (DISPOSABLE) IMPLANT
SWAB COLLECTION DEVICE MRSA (MISCELLANEOUS) IMPLANT
SWAB CULTURE ESWAB REG 1ML (MISCELLANEOUS) IMPLANT
TAPE CLOTH SURG 6X10 WHT LF (GAUZE/BANDAGES/DRESSINGS) ×1 IMPLANT
TOWEL GREEN STERILE (TOWEL DISPOSABLE) ×2 IMPLANT
TOWEL GREEN STERILE FF (TOWEL DISPOSABLE) ×2 IMPLANT

## 2020-05-25 NOTE — Transfer of Care (Signed)
Immediate Anesthesia Transfer of Care Note  Patient: Kristie Tapia  Procedure(s) Performed: INCISION AND DRAINAGE BUTTOCK ABSCESS (N/A Buttocks)  Patient Location: ICU  Anesthesia Type:General  Level of Consciousness: sedated, unresponsive and Patient remains intubated per anesthesia plan  Airway & Oxygen Therapy: Patient remains intubated per anesthesia plan and Patient placed on Ventilator (see vital sign flow sheet for setting)  Post-op Assessment: Report given to RN and Post -op Vital signs reviewed and stable  Post vital signs: Reviewed and stable  Last Vitals:  Vitals Value Taken Time  BP 124/70   Temp    Pulse 56 05/25/20 1018  Resp 14 05/25/20 1018  SpO2 99 % 05/25/20 1018  Vitals shown include unvalidated device data.  Last Pain:  Vitals:   05/25/20 0800  TempSrc: Axillary  PainSc:          Complications: No complications documented.

## 2020-05-25 NOTE — Progress Notes (Signed)
Initial Nutrition Assessment  DOCUMENTATION CODES:   Not applicable  INTERVENTION:   Ensure Enlive po BID, each supplement provides 350 kcal and 20 grams of protein  MVI with minerals  Encourage PO intake for post-op healing    NUTRITION DIAGNOSIS:   Increased nutrient needs related to post-op healing as evidenced by estimated needs.  GOAL:   Patient will meet greater than or equal to 90% of their needs  MONITOR:   PO intake,Supplement acceptance  REASON FOR ASSESSMENT:   Rounds    ASSESSMENT:   Pt with PMH of CVA, DM, fibromyalgia and IBS (on lomotil up to QID 12/2019) who is admitted for R buttock pain and abd pain. Per CT pt with necrotizing soft tissue infection to the R buttock, air tracking above the pelvic wall and into the retroperitoneal/perinephric space.   Pt discussed during ICU rounds and with RN. Spoke with patient and daughter.  Per daughter no recent weight loss, intake hx not known.  Per chart review weight hx 83.9 - 92 kg No weight hx from 2021  Medications reviewed and include: SSI Neosynephrine @ 35 mcg Labs reviewed: K+ 3.4 HgbA1C: 8.9 CBG's: 173, 244, 192, 167    NUTRITION - FOCUSED PHYSICAL EXAM:  Flowsheet Row Most Recent Value  Orbital Region No depletion  Upper Arm Region No depletion  Thoracic and Lumbar Region No depletion  Buccal Region No depletion  Temple Region No depletion  Clavicle Bone Region No depletion  Clavicle and Acromion Bone Region No depletion  Scapular Bone Region No depletion  Dorsal Hand No depletion  Patellar Region No depletion  Anterior Thigh Region No depletion  Posterior Calf Region No depletion  Edema (RD Assessment) None  Hair Reviewed  Eyes Reviewed  Mouth Unable to assess  Skin Reviewed  Nails Reviewed       Diet Order:   Diet Order            Diet Carb Modified Fluid consistency: Thin; Room service appropriate? Yes with Assist  Diet effective now                 EDUCATION NEEDS:    Education needs have been addressed  Skin:     Last BM:  unknown  Height:   Ht Readings from Last 1 Encounters:  05/23/20 5\' 5"  (1.651 m)    Weight:   Wt Readings from Last 1 Encounters:  05/24/20 92.1 kg    Ideal Body Weight:     BMI:  Body mass index is 33.79 kg/m.  Estimated Nutritional Needs:   Kcal:  2200-2400  Protein:  115-130 grams  Fluid:  > 2L/day  05/26/20., RD, LDN, CNSC See AMiON for contact information

## 2020-05-25 NOTE — Progress Notes (Signed)
2 Days Post-Op   Subjective/Chief Complaint: Pt wwith no acute changes    Objective: Vital signs in last 24 hours: Temp:  [99 F (37.2 C)-103.1 F (39.5 C)] 99.3 F (37.4 C) (12/22 0400) Pulse Rate:  [51-80] 53 (12/22 0800) Resp:  [1-18] 14 (12/22 0800) BP: (80-133)/(44-75) 103/61 (12/22 0800) SpO2:  [95 %-100 %] 100 % (12/22 0800) FiO2 (%):  [40 %] 40 % (12/22 0750) Last BM Date:  (PTA)  Intake/Output from previous day: 12/21 0701 - 12/22 0700 In: 8170.7 [I.V.:7054.7; IV Piggyback:1116] Out: 1110 [Urine:1110] Intake/Output this shift: Total I/O In: 333.5 [I.V.:333.5] Out: -   Physical Exam:  General: on vent Neuro: F/C HEENT/Neck: ETT Resp: clear to auscultation bilaterally CVS: RRR GI: soft, no tenderness Extremities: PAS Buttock dressing with some betadine stained fluid  Lab Results:  Recent Labs    05/24/20 0217 05/25/20 0323  WBC 20.9* 18.4*  HGB 14.0 12.9  HCT 40.4 40.1  PLT 304 371   BMET Recent Labs    05/24/20 0217 05/25/20 0323  NA 137 139  K 3.2* 3.4*  CL 100 107  CO2 25 23  GLUCOSE 286* 186*  BUN 12 14  CREATININE 1.25* 1.12*  CALCIUM 7.7* 6.9*   PT/INR Recent Labs    05/24/20 0217  LABPROT 15.6*  INR 1.3*   ABG No results for input(s): PHART, HCO3 in the last 72 hours.  Invalid input(s): PCO2, PO2  Studies/Results: CT ABDOMEN PELVIS W CONTRAST  Result Date: 05/23/2020 CLINICAL DATA:  Abdominal pain, fever, nausea, right buttock cellulitis EXAM: CT ABDOMEN AND PELVIS WITH CONTRAST TECHNIQUE: Multidetector CT imaging of the abdomen and pelvis was performed using the standard protocol following bolus administration of intravenous contrast. CONTRAST:  98mL OMNIPAQUE IOHEXOL 300 MG/ML  SOLN COMPARISON:  09/02/2017 FINDINGS: Lower chest: No acute pleural or parenchymal lung disease. Hepatobiliary: No focal liver abnormality is seen. Status post cholecystectomy. No biliary dilatation. Pancreas: Unremarkable. No pancreatic ductal  dilatation or surrounding inflammatory changes. Spleen: Spleen is enlarged measuring 14.6 cm in craniocaudal length. Stable subcentimeter hypodensity inferior spleen likely small cyst or hemangioma. Adrenals/Urinary Tract: Adrenal glands are unremarkable. Kidneys are normal, without renal calculi, focal lesion, or hydronephrosis. Bladder is unremarkable. Stomach/Bowel: No bowel obstruction or ileus. Minimal diverticulosis of the descending colon without diverticulitis. The appendix is surgically absent. No bowel wall thickening or inflammatory change. Vascular/Lymphatic: Aortic atherosclerosis. Enlarged lymph nodes are seen within the right inguinal region and along the right pelvic sidewall, largest measuring 14 mm on image 85. These are likely reactive. Reproductive: Status post hysterectomy. No adnexal masses. Other: There is no free intraperitoneal fluid or free gas. Subcutaneous edema and extensive subcutaneous gas is seen within the right buttock consistent with infection. The gas tracks superiorly in the retroperitoneal space along the right ileo psoas muscle and extends into the right perirenal space. I do not see any associated fluid collection or abscess. No abdominal wall hernia. Musculoskeletal: No acute or destructive bony lesions. Reconstructed images demonstrate no additional findings. IMPRESSION: 1. Findings compatible with cellulitis of the right buttock, with extensive subcutaneous gas consistent with gas-forming organism. The gas extends along the right pelvic sidewall in the retroperitoneum along the right iliopsoas muscle to the right perirenal space. No fluid collection or abscess. 2. Splenomegaly. 3. Reactive adenopathy within the right hemipelvis and right inguinal region. These results were called by telephone at the time of interpretation on 05/23/2020 at 8:40 pm to provider [facility] , who verbally acknowledged these results. Electronically  Signed   By: Sharlet Salina M.D.   On:  05/23/2020 20:40   DG Chest Port 1 View  Result Date: 05/23/2020 CLINICAL DATA:  Nausea and vomiting.  Shortness of breath. EXAM: PORTABLE CHEST 1 VIEW COMPARISON:  December 01, 2017 FINDINGS: The heart size and mediastinal contours are within normal limits. Both lungs are clear. The visualized skeletal structures are unremarkable. IMPRESSION: No active disease. Electronically Signed   By: Katherine Mantle M.D.   On: 05/23/2020 18:12    Anti-infectives: Anti-infectives (From admission, onward)   Start     Dose/Rate Route Frequency Ordered Stop   05/24/20 1800  vancomycin (VANCOREADY) IVPB 750 mg/150 mL  Status:  Discontinued        750 mg 150 mL/hr over 60 Minutes Intravenous Every 24 hours 05/23/20 1914 05/24/20 0811   05/24/20 1400  ceFEPIme (MAXIPIME) 2 g in sodium chloride 0.9 % 100 mL IVPB        2 g 200 mL/hr over 30 Minutes Intravenous Every 8 hours 05/24/20 0811     05/24/20 0900  vancomycin (VANCOCIN) IVPB 1000 mg/200 mL premix        1,000 mg 200 mL/hr over 60 Minutes Intravenous Every 12 hours 05/24/20 0811     05/24/20 0800  ceFEPIme (MAXIPIME) 2 g in sodium chloride 0.9 % 100 mL IVPB  Status:  Discontinued        2 g 200 mL/hr over 30 Minutes Intravenous Every 12 hours 05/23/20 1914 05/24/20 0811   05/23/20 2200  clindamycin (CLEOCIN) IVPB 600 mg        600 mg 100 mL/hr over 30 Minutes Intravenous Every 8 hours 05/23/20 2129     05/23/20 1830  vancomycin (VANCOREADY) IVPB 1500 mg/300 mL        1,500 mg 150 mL/hr over 120 Minutes Intravenous  Once 05/23/20 1813 05/23/20 2229   05/23/20 1800  ceFEPIme (MAXIPIME) 2 g in sodium chloride 0.9 % 100 mL IVPB        2 g 200 mL/hr over 30 Minutes Intravenous  Once 05/23/20 1750 05/23/20 1934   05/23/20 1800  metroNIDAZOLE (FLAGYL) IVPB 500 mg        500 mg 100 mL/hr over 60 Minutes Intravenous  Once 05/23/20 1750 05/23/20 2027   05/23/20 1800  vancomycin (VANCOCIN) IVPB 1000 mg/200 mL premix  Status:  Discontinued        1,000  mg 200 mL/hr over 60 Minutes Intravenous  Once 05/23/20 1750 05/23/20 1813      Assessment/Plan: Buttock and retroperitoneal abscess - S/P debridement by Dr. Derrell Lolling 12/20. Plan to go back to the OR today Acute hypoxic ventilator dependent respiratory failure - support as going to OR. Plan wean to extubate after OR FEN - hypokalemia - replete ID - Vanc/maxipime,clinda. Further surgery as above. VTE - Lovenox Dispo - ICU   LOS: 2 days    Axel Filler 05/25/2020

## 2020-05-25 NOTE — Anesthesia Postprocedure Evaluation (Signed)
Anesthesia Post Note  Patient: Kristie Tapia  Procedure(s) Performed: INCISION AND DRAINAGE BUTTOCK ABSCESS (N/A Buttocks)     Patient location during evaluation: SICU Anesthesia Type: General Level of consciousness: sedated Pain management: pain level controlled Vital Signs Assessment: post-procedure vital signs reviewed and stable Respiratory status: patient remains intubated per anesthesia plan Cardiovascular status: stable Postop Assessment: no apparent nausea or vomiting Anesthetic complications: no   No complications documented.  Last Vitals:  Vitals:   05/25/20 1200 05/25/20 1203  BP:    Pulse: 63 67  Resp: 19 16  Temp: 37.1 C   SpO2: 100% 100%    Last Pain:  Vitals:   05/25/20 1200  TempSrc: Axillary  PainSc:                  Trevor Iha

## 2020-05-25 NOTE — Anesthesia Procedure Notes (Signed)
Date/Time: 05/25/2020 9:15 AM Performed by: Aundria Rud, CRNA Pre-anesthesia Checklist: Patient identified, Emergency Drugs available, Suction available and Patient being monitored Patient Re-evaluated:Patient Re-evaluated prior to induction Oxygen Delivery Method: Circle system utilized Preoxygenation: Pre-oxygenation with 100% oxygen Induction Type: Inhalational induction Tube size: 7.0 mm Tube secured with: Tape Dental Injury: Teeth and Oropharynx as per pre-operative assessment  Comments: 7.5 ETT prior to OR

## 2020-05-25 NOTE — Procedures (Signed)
Extubation Procedure Note  Patient Details:   Name: Mialani Reicks DOB: [birth year] MRN: [id number]   Airway Documentation:    Vent end date: 05/25/20 Vent end time: 1200   Evaluation  O2 sats: stable throughout Complications: No apparent complications Patient did tolerate procedure well. Bilateral Breath Sounds: Clear   Yes   Patient extubated per MD order. Positive cuff leak. No stridor noted. Vitals are stable on 3L [place]. RN at bedside.  Harmon Dun Micajah Dennin 05/25/2020, 12:04 PM

## 2020-05-25 NOTE — Op Note (Signed)
05/25/2020  9:57 AM  PATIENT:  Kristie Tapia  49 y.o. female  PRE-OPERATIVE DIAGNOSIS:  buttock and retorperitioneal necrotizing soft tissue infection  POST-OPERATIVE DIAGNOSIS: Same  PROCEDURE:  Procedure(s): INCISION AND DEBRIDEMENT OF RIGHT BUTTOCK NECROTIZING SOFT TISSUE INFECTION, excise approximately 5 x 5 cm of subcutaneous necrotic tissue.  There is no viable tissue excised.  (N/A)  SURGEON:  Surgeon(s) and Role:    Axel Filler, MD - Primary  ANESTHESIA:   general  EBL:  minimal   BLOOD ADMINISTERED:none  DRAINS: Betadine soaked Kerlix x1  LOCAL MEDICATIONS USED:  NONE  SPECIMEN:  No Specimen  DISPOSITION OF SPECIMEN:  N/A  COUNTS:  YES  TOURNIQUET:  * No tourniquets in log *  DICTATION: .Dragon Dictation  Patient is a 49 year old female, who comes in secondary to right buttock necrotic soft tissue infection.  Patient was taken that previously for debridement.  Patient returned to the OR for second look.  Findings: Patient had a small area of continued necrotic soft tissue.  Majority of the tissue cephalad appear to be viable.  All remaining tissue appeared to be healthy, bleeding, without any further necrosis.  Details of procedure: After the patient was consented she was taken back to the OR and placed in supine position with bilateral SCDs in place.  She underwent general endotracheal intubation.  Patient was then placed in the prone position on the OR table.  Patient was prepped and draped in standard fashion.  A timeout was called all facts verified.  At this time the area was reexamined superficially for some necrosis.  There was some necrotic infected subcutaneous tissue just medial to the wound as well as in the lateral aspect in the posterior aspect of the incision site. The most cephalad area of the wound appeared to be free of any necrosis.  The area of soft tissue/subcutaneous necrosis was excised sharply.  This left remaining healthy adipose  tissue that was bleeding.  This did go deep to the level of the gluteus muscle. At this point Betadine soaked Kerlix was placed to the wound.  This was dressed with ABD pad and tape.  Patient tolerated the procedure well was taken to the recovery in stable condition.  PLAN OF CARE: Admit to inpatient   PATIENT DISPOSITION:  ICU - intubated and hemodynamically stable.   Delay start of Pharmacological VTE agent (>24hrs) due to surgical blood loss or risk of bleeding: no

## 2020-05-26 ENCOUNTER — Encounter (HOSPITAL_COMMUNITY): Payer: Self-pay | Admitting: General Surgery

## 2020-05-26 LAB — GLUCOSE, CAPILLARY
Glucose-Capillary: 192 mg/dL — ABNORMAL HIGH (ref 70–99)
Glucose-Capillary: 167 mg/dL — ABNORMAL HIGH (ref 70–99)
Glucose-Capillary: 167 mg/dL — ABNORMAL HIGH (ref 70–99)
Glucose-Capillary: 279 mg/dL — ABNORMAL HIGH (ref 70–99)
Glucose-Capillary: 262 mg/dL — ABNORMAL HIGH (ref 70–99)

## 2020-05-26 LAB — BASIC METABOLIC PANEL
Anion gap: 9 (ref 5–15)
BUN: 19 mg/dL (ref 6–20)
CO2: 21 mmol/L — ABNORMAL LOW (ref 22–32)
Calcium: 7.3 mg/dL — ABNORMAL LOW (ref 8.9–10.3)
Chloride: 110 mmol/L (ref 98–111)
Creatinine, Ser: 0.99 mg/dL (ref 0.44–1.00)
GFR, Estimated: >60 mL/min (ref >60)
Glucose, Bld: 170 mg/dL — ABNORMAL HIGH (ref 70–99)
Potassium: 3.6 mmol/L (ref 3.5–5.1)
Sodium: 140 mmol/L (ref 135–145)

## 2020-05-26 LAB — VANCOMYCIN, TROUGH: Vancomycin Tr: 17 ug/mL (ref 15–20)

## 2020-05-26 MED ORDER — ESCITALOPRAM OXALATE 10 MG PO TABS
20.0000 mg | ORAL_TABLET | Freq: Every day | ORAL | Status: DC
  Administered 2020-05-26 – 2020-06-09 (×15): 20 mg via ORAL
  Filled 2020-05-26 (×15): qty 2

## 2020-05-26 MED ORDER — SUMATRIPTAN SUCCINATE 100 MG PO TABS
100.0000 mg | ORAL_TABLET | ORAL | Status: DC | PRN
  Filled 2020-05-26: qty 1

## 2020-05-26 MED ORDER — ENSURE ENLIVE PO LIQD
237.0000 mL | Freq: Two times a day (BID) | ORAL | Status: DC
  Administered 2020-05-26 – 2020-05-31 (×7): 237 mL via ORAL
  Filled 2020-05-26: qty 237

## 2020-05-26 MED ORDER — ACETAMINOPHEN 325 MG PO TABS
650.0000 mg | ORAL_TABLET | Freq: Four times a day (QID) | ORAL | Status: DC | PRN
  Administered 2020-05-26 – 2020-06-10 (×3): 650 mg via ORAL
  Filled 2020-05-26 (×3): qty 2

## 2020-05-26 MED ORDER — ALUM & MAG HYDROXIDE-SIMETH 200-200-20 MG/5ML PO SUSP: 30.0000 mL | Freq: Four times a day (QID) | ORAL | Status: DC | PRN

## 2020-05-26 MED ORDER — PREGABALIN 100 MG PO CAPS
200.0000 mg | ORAL_CAPSULE | Freq: Every day | ORAL | Status: DC
Start: 2020-05-26 — End: 2020-06-10
  Administered 2020-05-26 – 2020-06-09 (×15): 200 mg via ORAL
  Filled 2020-05-26 (×15): qty 2

## 2020-05-26 MED ORDER — ALBUTEROL SULFATE (2.5 MG/3ML) 0.083% IN NEBU: 2.5000 mg | INHALATION_SOLUTION | Freq: Four times a day (QID) | RESPIRATORY_TRACT | Status: DC | PRN

## 2020-05-26 MED ORDER — ENOXAPARIN SODIUM 40 MG/0.4ML ~~LOC~~ SOLN
40.0000 mg | SUBCUTANEOUS | Status: DC
  Administered 2020-05-26 – 2020-06-10 (×16): 40 mg via SUBCUTANEOUS
  Filled 2020-05-26 (×16): qty 0.4

## 2020-05-26 MED ORDER — METRONIDAZOLE 500 MG PO TABS
500.0000 mg | ORAL_TABLET | Freq: Three times a day (TID) | ORAL | Status: DC
  Administered 2020-05-26 – 2020-06-01 (×17): 500 mg via ORAL
  Filled 2020-05-26 (×20): qty 1

## 2020-05-26 MED ORDER — FUROSEMIDE 20 MG PO TABS
20.0000 mg | ORAL_TABLET | Freq: Every day | ORAL | Status: DC
Start: 2020-05-26 — End: 2020-06-02
  Administered 2020-05-26 – 2020-06-02 (×7): 20 mg via ORAL
  Filled 2020-05-26 (×8): qty 1

## 2020-05-26 MED ORDER — PHENTOLAMINE MESYLATE 5 MG IJ SOLR
5.0000 mg | Freq: Once | INTRAMUSCULAR | Status: AC
  Administered 2020-05-26: 5 mg via SUBCUTANEOUS
  Filled 2020-05-26: qty 5

## 2020-05-26 MED ORDER — DIPHENHYDRAMINE HCL 25 MG PO CAPS
25.0000 mg | ORAL_CAPSULE | Freq: Four times a day (QID) | ORAL | Status: DC | PRN
  Administered 2020-05-26: 25 mg via ORAL
  Filled 2020-05-26: qty 1

## 2020-05-26 MED ORDER — ADULT MULTIVITAMIN W/MINERALS CH
1.0000 | ORAL_TABLET | Freq: Every day | ORAL | Status: DC
  Administered 2020-05-26 – 2020-06-10 (×16): 1 via ORAL
  Filled 2020-05-26 (×16): qty 1

## 2020-05-26 MED ORDER — DONEPEZIL HCL 10 MG PO TABS
10.0000 mg | ORAL_TABLET | Freq: Every day | ORAL | Status: DC
  Administered 2020-05-26 – 2020-06-10 (×16): 10 mg via ORAL
  Filled 2020-05-26 (×16): qty 1

## 2020-05-26 MED ORDER — LORAZEPAM 1 MG PO TABS: 1.0000 mg | ORAL_TABLET | Freq: Two times a day (BID) | ORAL | Status: DC | PRN

## 2020-05-26 MED ORDER — HYDROCODONE-ACETAMINOPHEN 5-325 MG PO TABS
1.0000 | ORAL_TABLET | ORAL | Status: DC | PRN
  Administered 2020-05-26: 2 via ORAL
  Filled 2020-05-26: qty 2

## 2020-05-26 MED ORDER — PREGABALIN 100 MG PO CAPS
100.0000 mg | ORAL_CAPSULE | Freq: Every morning | ORAL | Status: DC
Start: 2020-05-26 — End: 2020-06-10
  Administered 2020-05-26 – 2020-06-10 (×15): 100 mg via ORAL
  Filled 2020-05-26 (×11): qty 1
  Filled 2020-05-26 (×2): qty 2
  Filled 2020-05-26 (×2): qty 1

## 2020-05-26 MED ORDER — FLUTICASONE PROPIONATE 50 MCG/ACT NA SUSP
1.0000 | Freq: Every day | NASAL | Status: DC | PRN
  Filled 2020-05-26: qty 16

## 2020-05-26 MED ORDER — NITROGLYCERIN 2 % TD OINT
1.0000 [in_us] | TOPICAL_OINTMENT | Freq: Three times a day (TID) | TRANSDERMAL | Status: AC
  Administered 2020-05-26 – 2020-05-27 (×5): 1 [in_us] via TOPICAL
  Filled 2020-05-26: qty 30

## 2020-05-26 MED ORDER — TRAZODONE HCL 50 MG PO TABS
150.0000 mg | ORAL_TABLET | Freq: Every day | ORAL | Status: DC
  Administered 2020-05-26 – 2020-06-09 (×15): 150 mg via ORAL
  Filled 2020-05-26 (×15): qty 3

## 2020-05-26 MED ORDER — FAMOTIDINE 20 MG PO TABS
40.0000 mg | ORAL_TABLET | Freq: Every day | ORAL | Status: DC
  Administered 2020-05-26 – 2020-06-09 (×15): 40 mg via ORAL
  Filled 2020-05-26 (×15): qty 2

## 2020-05-26 NOTE — Progress Notes (Signed)
Patient left peripheral IV's infiltrated while running Neo. All infusions stopped, fluid pulled back from IV's, IV's removed. Extravasation standing orders put in, awaiting medication from pharmacy. IV team consult, patient no longer has IV access. This RN will continue to monitor

## 2020-05-26 NOTE — Progress Notes (Signed)
Pharmacy Antibiotic Note  Kristie Tapia is a 49 y.o. female admitted on 05/23/2020 with necrotizing soft tissue infection of buttock extending above pelvic wall into retroperitoneal space. Pharmacy has been consulted for Cefepime and vancomycin dosing - day #4. Clindamycin transitioned to Flagyl today per MD.   S/p OR 12/20 for I&D of necrotic tissues.  WBC elevated, trending down to 18.4, afebrile SCr down to 0.99 (baseline ~0.8). Good UOP recorded Vancomycin trough this AM resulted therapeutic at 17, drawn 30 minutes late.  Plan: Continue cefepime 2 gm IV Q 8 hours Continue vancomycin 1g IV Q 12 hrs D/c Clinda >> Flagyl PO per MD Monitor CBC, renal fx, cultures and clinical progress Repeat vancomycin levels weekly and as indicated   Height: 5\' 5"  (165.1 cm) Weight: 92.1 kg (203 lb 0.7 oz) IBW/kg (Calculated) : 57  Temp (24hrs), Avg:98.5 F (36.9 C), Min:97.4 F (36.3 C), Max:99.1 F (37.3 C)  Recent Labs  Lab 05/23/20 1549 05/23/20 1900 05/23/20 2354 05/24/20 0217 05/25/20 0323 05/26/20 0957  WBC 21.4*  --   --  20.9* 18.4*  --   CREATININE 1.64*  --  0.90 1.25* 1.12* 0.99  LATICACIDVEN  --  2.0*  --  1.9  --   --   VANCOTROUGH  --   --   --   --   --  17    Estimated Creatinine Clearance: 77 mL/min (by C-G formula based on SCr of 0.99 mg/dL).    Allergies  Allergen Reactions   Sulfa Antibiotics Other (See Comments)    Burns the inside of my mouth; "blisters; leaves tongue and inside of mouth solid red"   Tape Other (See Comments)    Blisters- USE PAPER TAPE ONLY   Decongestant [Pseudoephedrine Hcl Er] Itching    ALL DECONGESTANTS   Duloxetine Other (See Comments)    Felt "out of it"   Fremanezumab-Vfrm Other (See Comments) and Nausea Only   Tizanidine Other (See Comments)    headache   Chlorpheniramine Itching   Red Dye Other (See Comments)    CAUSES MIGRAINES   Eggs Or Egg-Derived Products Nausea And Vomiting   Latex Itching   Percocet  [Oxycodone-Acetaminophen] Other (See Comments)    PT reports having nightmares when taking Percocet   Prednisone Other (See Comments)    "Gives me a bad attitude"   Zoloft [Sertraline Hcl] Rash    Antimicrobials this admission: Cefepime 12/20 >>  Vanc 12/20 >>  Clindamycin 12/20 >> 12/23 Flagyl 12/23 >>  Microbiology results: 12/21 Buttock tissue >> few GNR, rare GPC, GPR; holding for possible anaerobe 12/20 Urine >> neg 12/20 Blood >> ngtd   1/21, PharmD, BCPS Please check AMION for all [facility] contact numbers Clinical Pharmacist 05/26/2020 11:08 AM

## 2020-05-26 NOTE — Progress Notes (Signed)
1 Day Post-Op   Subjective/Chief Complaint: Pt went back to OR with Dr. Derrell Lolling yesterday.  She had more necrotic tissue in the buttock, but retroperitoneal region was more clean.  Extubated.  Hungry.  Also had questionable infiltration of pressors into the left forearm.    Objective: Vital signs in last 24 hours: Temp:  [97.4 F (36.3 C)-99.1 F (37.3 C)] 97.4 F (36.3 C) (12/23 0800) Pulse Rate:  [52-85] 66 (12/23 0635) Resp:  [8-27] 13 (12/23 0635) BP: (76-142)/(45-76) 105/50 (12/23 0630) SpO2:  [88 %-100 %] 95 % (12/23 0635) Last BM Date:  (pta)  Intake/Output from previous day: 12/22 0701 - 12/23 0700 In: 2109.2 [I.V.:1608.8; IV Piggyback:500.4] Out: 1150 [Urine:1150] Intake/Output this shift: No intake/output data recorded.  Physical Exam:  General: extubated, sleepy, but following commands and conversant/arousable.   Neuro: F/C HEENT/Neck: PERRL Resp: clear to auscultation bilaterally, no wheezing.   CVS: RRR, no murmurs. GI: soft, no tenderness Extremities: PAS Buttock dressing changed.  Packing removed.  Some dusky tissue at buttock edges.  Saline soaked kerlix repacked.    Lab Results:  Recent Labs    05/24/20 0217 05/25/20 0323  WBC 20.9* 18.4*  HGB 14.0 12.9  HCT 40.4 40.1  PLT 304 371   BMET Recent Labs    05/24/20 0217 05/25/20 0323  NA 137 139  K 3.2* 3.4*  CL 100 107  CO2 25 23  GLUCOSE 286* 186*  BUN 12 14  CREATININE 1.25* 1.12*  CALCIUM 7.7* 6.9*   PT/INR Recent Labs    05/24/20 0217  LABPROT 15.6*  INR 1.3*   ABG No results for input(s): PHART, HCO3 in the last 72 hours.  Invalid input(s): PCO2, PO2  Studies/Results: No results found.  Anti-infectives: Anti-infectives (From admission, onward)   Start     Dose/Rate Route Frequency Ordered Stop   05/25/20 2200  vancomycin (VANCOCIN) IVPB 1000 mg/200 mL premix        1,000 mg 200 mL/hr over 60 Minutes Intravenous Every 12 hours 05/25/20 0918     05/24/20 1800  vancomycin  (VANCOREADY) IVPB 750 mg/150 mL  Status:  Discontinued        750 mg 150 mL/hr over 60 Minutes Intravenous Every 24 hours 05/23/20 1914 05/24/20 0811   05/24/20 1400  ceFEPIme (MAXIPIME) 2 g in sodium chloride 0.9 % 100 mL IVPB        2 g 200 mL/hr over 30 Minutes Intravenous Every 8 hours 05/24/20 0811     05/24/20 0900  vancomycin (VANCOCIN) IVPB 1000 mg/200 mL premix  Status:  Discontinued        1,000 mg 200 mL/hr over 60 Minutes Intravenous Every 12 hours 05/24/20 0811 05/25/20 0918   05/24/20 0800  ceFEPIme (MAXIPIME) 2 g in sodium chloride 0.9 % 100 mL IVPB  Status:  Discontinued        2 g 200 mL/hr over 30 Minutes Intravenous Every 12 hours 05/23/20 1914 05/24/20 0811   05/23/20 2200  clindamycin (CLEOCIN) IVPB 600 mg        600 mg 100 mL/hr over 30 Minutes Intravenous Every 8 hours 05/23/20 2129     05/23/20 1830  vancomycin (VANCOREADY) IVPB 1500 mg/300 mL        1,500 mg 150 mL/hr over 120 Minutes Intravenous  Once 05/23/20 1813 05/23/20 2229   05/23/20 1800  ceFEPIme (MAXIPIME) 2 g in sodium chloride 0.9 % 100 mL IVPB        2 g 200 mL/hr  over 30 Minutes Intravenous  Once 05/23/20 1750 05/23/20 1934   05/23/20 1800  metroNIDAZOLE (FLAGYL) IVPB 500 mg        500 mg 100 mL/hr over 60 Minutes Intravenous  Once 05/23/20 1750 05/23/20 2027   05/23/20 1800  vancomycin (VANCOCIN) IVPB 1000 mg/200 mL premix  Status:  Discontinued        1,000 mg 200 mL/hr over 60 Minutes Intravenous  Once 05/23/20 1750 05/23/20 1813      Assessment/Plan: Buttock and retroperitoneal abscess - S/P debridement by Dr. Derrell Lolling 12/20, 12/22.  Acute hypoxic ventilator dependent respiratory failure - resolved FEN - hypokalemia - replete ID - cultures polymicrobial gram stain, NGTD, cover anaerobes, GPC and GNR.  Leave foley for 1 more day.   VTE - Lovenox ppx dosing.   Pain control with PRN dilaudid.  Add oxycodone PRN.  Dispo - ICU today, if stays off neo, transfer to floor tomorrow or later today  if bed needed.     LOS: 3 days   Maudry Diego, MD FACS Surgical Oncology, General Surgery, Trauma and Critical [facility], [place] [phone] for weekday/non holidays Check [email or web address] for coverage night/weekend/holidays  Do not use SecureChat as it is not reliable for timely patient care.

## 2020-05-27 LAB — GLUCOSE, CAPILLARY
Glucose-Capillary: 107 mg/dL — ABNORMAL HIGH (ref 70–99)
Glucose-Capillary: 145 mg/dL — ABNORMAL HIGH (ref 70–99)
Glucose-Capillary: 204 mg/dL — ABNORMAL HIGH (ref 70–99)
Glucose-Capillary: 236 mg/dL — ABNORMAL HIGH (ref 70–99)
Glucose-Capillary: 249 mg/dL — ABNORMAL HIGH (ref 70–99)
Glucose-Capillary: 268 mg/dL — ABNORMAL HIGH (ref 70–99)
Glucose-Capillary: 301 mg/dL — ABNORMAL HIGH (ref 70–99)

## 2020-05-27 MED ORDER — HYDROCODONE-ACETAMINOPHEN 5-325 MG PO TABS
1.0000 | ORAL_TABLET | ORAL | Status: DC | PRN
  Administered 2020-05-27 – 2020-06-09 (×25): 2 via ORAL
  Filled 2020-05-27 (×25): qty 2

## 2020-05-27 MED ORDER — KETOROLAC TROMETHAMINE 15 MG/ML IJ SOLN
30.0000 mg | Freq: Four times a day (QID) | INTRAMUSCULAR | Status: DC
  Administered 2020-05-27 – 2020-05-28 (×5): 30 mg via INTRAVENOUS
  Filled 2020-05-27 (×5): qty 2

## 2020-05-27 MED ORDER — HYDROMORPHONE HCL 1 MG/ML IJ SOLN
0.5000 mg | Freq: Four times a day (QID) | INTRAMUSCULAR | Status: DC | PRN
  Administered 2020-05-28 – 2020-06-09 (×17): 0.5 mg via INTRAVENOUS
  Filled 2020-05-27 (×17): qty 0.5

## 2020-05-27 MED ORDER — DAKINS (1/4 STRENGTH) 0.125 % EX SOLN
Freq: Two times a day (BID) | CUTANEOUS | Status: AC
  Administered 2020-05-29 – 2020-05-30 (×3): 1
  Filled 2020-05-27 (×4): qty 473

## 2020-05-27 NOTE — Progress Notes (Signed)
Patient admitted to room alert and orient x4. Skin dry and warm to touch. No pain voiced. Dressing to rt buttock intact no drainage noted. Skin dry and warm to touch, bruise noted to upper lt arm.

## 2020-05-27 NOTE — Progress Notes (Signed)
General Surgery Follow Up Note  Subjective:    Overnight Issues:   Objective:  Vital signs for last 24 hours: Temp:  [97.4 F (36.3 C)-98 F (36.7 C)] 98 F (36.7 C) (12/24 0400) Pulse Rate:  [54-77] 54 (12/24 0400) Resp:  [11-29] 15 (12/24 0400) BP: (104-132)/(47-76) 104/52 (12/24 0400) SpO2:  [90 %-99 %] 95 % (12/24 0400)  Hemodynamic parameters for last 24 hours:    Intake/Output from previous day: 12/23 0701 - 12/24 0700 In: 1943.7 [I.V.:1239.8; IV Piggyback:703.9] Out: 1050 [Urine:1050]  Intake/Output this shift: Total I/O In: 414.9 [I.V.:110.9; IV Piggyback:303.9] Out: 675 [Urine:675]  Vent settings for last 24 hours:    Physical Exam:  Gen: comfortable, no distress Neuro: non-focal exam HEENT: PERRL Neck: supple CV: RRR Pulm: unlabored breathing Abd: soft, NT GU: clear yellow urine, foley, perineal wound with  Discolored base, dressed wtd Extr: wwp, no edema   Results for orders placed or performed during the hospital encounter of 05/23/20 (from the past 24 hour(s))  Glucose, capillary     Status: Abnormal   Collection Time: 05/26/20  7:35 AM  Result Value Ref Range   Glucose-Capillary 167 (H) 70 - 99 mg/dL  Vancomycin, trough     Status: None   Collection Time: 05/26/20  9:57 AM  Result Value Ref Range   Vancomycin Tr 17 15 - 20 ug/mL  Basic metabolic panel     Status: Abnormal   Collection Time: 05/26/20  9:57 AM  Result Value Ref Range   Sodium 140 135 - 145 mmol/L   Potassium 3.6 3.5 - 5.1 mmol/L   Chloride 110 98 - 111 mmol/L   CO2 21 (L) 22 - 32 mmol/L   Glucose, Bld 170 (H) 70 - 99 mg/dL   BUN 19 6 - 20 mg/dL   Creatinine, Ser 1.88 0.44 - 1.00 mg/dL   Calcium 7.3 (L) 8.9 - 10.3 mg/dL   GFR, Estimated >41 >66 mL/min   Anion gap 9 5 - 15  Glucose, capillary     Status: Abnormal   Collection Time: 05/26/20 11:40 AM  Result Value Ref Range   Glucose-Capillary 167 (H) 70 - 99 mg/dL  Glucose, capillary     Status: Abnormal    Collection Time: 05/26/20  3:37 PM  Result Value Ref Range   Glucose-Capillary 279 (H) 70 - 99 mg/dL  Glucose, capillary     Status: Abnormal   Collection Time: 05/26/20  8:22 PM  Result Value Ref Range   Glucose-Capillary 262 (H) 70 - 99 mg/dL  Glucose, capillary     Status: Abnormal   Collection Time: 05/26/20 11:57 PM  Result Value Ref Range   Glucose-Capillary 268 (H) 70 - 99 mg/dL  Glucose, capillary     Status: Abnormal   Collection Time: 05/27/20  4:42 AM  Result Value Ref Range   Glucose-Capillary 145 (H) 70 - 99 mg/dL    Assessment & Plan:  Present on Admission: . Necrotizing soft tissue infection    LOS: 4 days   Additional comments:I reviewed the patient's new clinical lab test results.   and I reviewed the patients new imaging test results.    Buttock and retroperitoneal abscess- S/P debridement by Dr. Derrell Lolling 12/20, 12/22, cont BID dressing changes, add Dakins, WOC c/s Acute hypoxic ventilator dependent respiratory failure- resolved FEN- persistent hyperglycemia, but improving ID- cultures polymicrobial gram stain, so far only Gardnerella, cover anaerobes, GPC and GNR. Vanc/cefepime day 3 Foley - remove today VTE- Lovenox ppx dosing.  Pain control optimize PO meds  Dispo- med surg  Diamantina Monks, MD Trauma & General Surgery Please use [email or web address] to contact on call provider  05/27/2020  *Care during the described time interval was provided by me. I have reviewed this patient's available data, including medical history, events of note, physical examination and test results as part of my evaluation.

## 2020-05-27 NOTE — Plan of Care (Signed)
  Problem: Safety: Goal: Ability to remain free from injury will improve Outcome: Progressing   Problem: Skin Integrity: Goal: Risk for impaired skin integrity will decrease Outcome: Progressing   Problem: Pain Managment: Goal: General experience of comfort will improve Outcome: Progressing   

## 2020-05-27 NOTE — Plan of Care (Signed)
  Problem: Education: Goal: Knowledge of General Education information will improve Description: Including pain rating scale, medication(s)/side effects and non-pharmacologic comfort measures Outcome: Progressing   Problem: Health Behavior/Discharge Planning: Goal: Ability to manage health-related needs will improve Outcome: Progressing   Problem: Clinical Measurements: Goal: Will remain free from infection Outcome: Progressing   Problem: Activity: Goal: Risk for activity intolerance will decrease Outcome: Not Progressing   Problem: Nutrition: Goal: Adequate nutrition will be maintained Outcome: Progressing

## 2020-05-27 NOTE — Consult Note (Signed)
WOC Nurse Consult Note: Reason for Consult:Buttock and retroperitoneal abscess.  Last debridement 05/25/20.  25% devitalized tissue noted to wound bed.  Visible muscle noted to wound bed. Surgery has ordered BID dressing changes and implemented Dakins solution starting today.  No further WOC interventions noted.    Wound type:infectious  With recent debridement nonviable tissue.  Pressure Injury POA: NA Measurement: 8 cm x 5 cm x 4 cm with muscle noted and nonviable tissue noted at 6PM in wound bed.  Wound bed:see above Drainage (amount, consistency, odor) moderate tan effluent  No odor noted.   Periwound:intact  Erythema, tender to touch.  Dressing procedure/placement/frequency:Per surgical team:  Cleanse right buttocks wound with NS and pat gently dry.  Fill dead space with Dakins moist kerlix. Top with dry gauze and ABD pad and tape. Change twice daily.  Will not follow at this time.  Please re-consult if needed.  Kristie Hudson MSN, RN, FNP-BC CWON Wound, Ostomy, Continence Nurse Pager [phone]

## 2020-05-27 NOTE — Progress Notes (Deleted)
Discharge package printed and instructions given to patient. Patient verbalizes understanding. Walker sent with pt. 

## 2020-05-27 NOTE — Evaluation (Signed)
Physical Therapy Evaluation Patient Details Name: Kristie Tapia MRN: [id number] DOB: [birth year] Today's Date: 05/27/2020   History of Present Illness  Patient is a 49 year old female, with a history of CVA, diabetes, fibromyalgia, who comes in secondary to right buttock pain and abdominal pain.   Pt s/p I and D x2 of non viable tissues and drainage of abscess.  Clinical Impression  Pt admitted with/for pain from a R buttock abscess.  Post I and D pt is unfocused and unsteady with gait.Marland Kitchen  Pt currently limited functionally due to the problems listed below.  (see problems list.)  Pt will benefit from PT to maximize function and safety to be able to get home safely with available assist.     Follow Up Recommendations Home health PT;Other (comment) (likely will not need HH if gets another PT session)    Equipment Recommendations  Other (comment) (possibly a cane)    Recommendations for Other Services       Precautions / Restrictions Precautions Precautions: Fall Restrictions Weight Bearing Restrictions: No      Mobility  Bed Mobility Overal bed mobility: Modified Independent                  Transfers Overall transfer level: Needs assistance Equipment used: None;Rolling walker (2 wheeled) Transfers: Sit to/from Stand Sit to Stand: Min guard         General transfer comment: cues for hand placement/safety  Ambulation/Gait Ambulation/Gait assistance: Min assist Gait Distance (Feet): 70 Feet Assistive device: None;Rolling walker (2 wheeled);IV Pole Gait Pattern/deviations: Step-through pattern   Gait velocity interpretation: <1.31 ft/sec, indicative of household ambulator General Gait Details: unsteady/staggery overall.  Very distractable making overall safety worse.  Safe with min assist and/or AD.  Pt unable to focus enough for instruction with the cane.  Stairs            Wheelchair Mobility    Modified Rankin (Stroke Patients Only)        Balance Overall balance assessment: Needs assistance Sitting-balance support: Single extremity supported;No upper extremity supported;Feet supported Sitting balance-Leahy Scale: Fair Sitting balance - Comments: squirmy due to R buttock pain     Standing balance-Leahy Scale: Poor Standing balance comment: reliant on UE support                             Pertinent Vitals/Pain Pain Assessment: Faces Faces Pain Scale: Hurts even more Pain Location: R buttock Pain Descriptors / Indicators: Discomfort;Sore Pain Intervention(s): Monitored during session    Home Living Family/patient expects to be discharged to:: Private residence Living Arrangements: Spouse/significant other;Children Available Help at Discharge: Family;Available 24 hours/day Type of Home: Mobile home Home Access: Stairs to enter Entrance Stairs-Rails: Lawyer of Steps: several Home Layout: One level Home Equipment: None      Prior Function Level of Independence: Independent               Hand Dominance        Extremity/Trunk Assessment   Upper Extremity Assessment Upper Extremity Assessment: Overall WFL for tasks assessed    Lower Extremity Assessment Lower Extremity Assessment: Overall WFL for tasks assessed (mild R proximal weakness)       Communication   Communication: No difficulties  Cognition Arousal/Alertness: Awake/alert Behavior During Therapy: WFL for tasks assessed/performed Overall Cognitive Status: Impaired/Different from baseline Area of Impairment: Attention;Safety/judgement;Awareness;Problem solving  Current Attention Level: Sustained     Safety/Judgement: Decreased awareness of safety Awareness: Emergent Problem Solving: Slow processing General Comments: expect quick return to baseline      General Comments      Exercises     Assessment/Plan    PT Assessment Patient needs continued PT services   PT Problem List Decreased strength;Decreased balance;Decreased activity tolerance;Decreased mobility;Decreased cognition;Decreased knowledge of use of DME;Pain       PT Treatment Interventions DME instruction;Gait training;Functional mobility training;Stair training;Therapeutic activities;Balance training;Patient/family education    PT Goals (Current goals can be found in the Care Plan section)  Acute Rehab PT Goals Patient Stated Goal: get this pain and swelling down PT Goal Formulation: With patient Time For Goal Achievement: 06/03/20 Potential to Achieve Goals: Good    Frequency Min 3X/week   Barriers to discharge        Co-evaluation               AM-PAC PT "6 Clicks" Mobility  Outcome Measure Help needed turning from your back to your side while in a flat bed without using bedrails?: A Little Help needed moving from lying on your back to sitting on the side of a flat bed without using bedrails?: A Little Help needed moving to and from a bed to a chair (including a wheelchair)?: A Little Help needed standing up from a chair using your arms (e.g., wheelchair or bedside chair)?: A Little Help needed to walk in hospital room?: A Little Help needed climbing 3-5 steps with a railing? : A Lot 6 Click Score: 17    End of Session   Activity Tolerance: Patient tolerated treatment well;Patient limited by fatigue Patient left: in bed;with call bell/phone within reach;with bed alarm set;with family/visitor present Nurse Communication: Mobility status PT Visit Diagnosis: Other abnormalities of gait and mobility (R26.89);Pain;Difficulty in walking, not elsewhere classified (R26.2) Pain - Right/Left: Right Pain - part of body:  (buttock)    Time: 0932-6712 PT Time Calculation (min) (ACUTE ONLY): 52 min   Charges:   PT Evaluation $PT Eval Moderate Complexity: 1 Mod PT Treatments $Gait Training: 8-22 mins $Therapeutic Activity: 8-22 mins        05/27/2020  Jacinto Halim.,  PT Acute Rehabilitation Services [phone]  (pager) [phone]  (office)  Eliseo Gum Dusten [place]inwood 05/27/2020, 11:39 AM

## 2020-05-28 LAB — CBC
HCT: 37.4 % (ref 36.0–46.0)
Hemoglobin: 12.5 g/dL (ref 12.0–15.0)
MCH: 30.1 pg (ref 26.0–34.0)
MCHC: 33.4 g/dL (ref 30.0–36.0)
MCV: 90.1 fL (ref 80.0–100.0)
Platelets: 375 10*3/uL (ref 150–400)
RBC: 4.15 MIL/uL (ref 3.87–5.11)
RDW: 16.5 % — ABNORMAL HIGH (ref 11.5–15.5)
WBC: 21.1 10*3/uL — ABNORMAL HIGH (ref 4.0–10.5)
nRBC: 0 % (ref 0.0–0.2)

## 2020-05-28 LAB — BASIC METABOLIC PANEL
Anion gap: 7 (ref 5–15)
BUN: 16 mg/dL (ref 6–20)
CO2: 24 mmol/L (ref 22–32)
Calcium: 7.7 mg/dL — ABNORMAL LOW (ref 8.9–10.3)
Chloride: 105 mmol/L (ref 98–111)
Creatinine, Ser: 1 mg/dL (ref 0.44–1.00)
GFR, Estimated: >60 mL/min (ref >60)
Glucose, Bld: 211 mg/dL — ABNORMAL HIGH (ref 70–99)
Potassium: 3.3 mmol/L — ABNORMAL LOW (ref 3.5–5.1)
Sodium: 136 mmol/L (ref 135–145)

## 2020-05-28 LAB — GLUCOSE, CAPILLARY
Glucose-Capillary: 183 mg/dL — ABNORMAL HIGH (ref 70–99)
Glucose-Capillary: 205 mg/dL — ABNORMAL HIGH (ref 70–99)
Glucose-Capillary: 191 mg/dL — ABNORMAL HIGH (ref 70–99)
Glucose-Capillary: 234 mg/dL — ABNORMAL HIGH (ref 70–99)
Glucose-Capillary: 217 mg/dL — ABNORMAL HIGH (ref 70–99)

## 2020-05-28 LAB — CULTURE, BLOOD (ROUTINE X 2)
Culture: NO GROWTH
Culture: NO GROWTH

## 2020-05-28 MED ORDER — ONDANSETRON HCL 4 MG/2ML IJ SOLN
4.0000 mg | Freq: Three times a day (TID) | INTRAMUSCULAR | Status: DC | PRN
  Administered 2020-05-28 – 2020-05-29 (×4): 4 mg via INTRAVENOUS
  Filled 2020-05-28 (×4): qty 2

## 2020-05-28 NOTE — Progress Notes (Signed)
Foley catheter removed this AM at 0602 per MD note from Dr. Bedelia Person on 12/24. Patient is due to void.

## 2020-05-28 NOTE — Progress Notes (Signed)
3 Days Post-Op  Subjective: CC: Patient tolerating dressing changes. Tolerating diet without abdominal pain or emesis. Still having some nausea.   Objective: Vital signs in last 24 hours: Temp:  [97.9 F (36.6 C)-100.6 F (38.1 C)] 98 F (36.7 C) (12/25 0748) Pulse Rate:  [52-79] 61 (12/25 0748) Resp:  [16-18] 18 (12/25 0748) BP: (102-124)/(48-56) 124/56 (12/25 0748) SpO2:  [92 %-98 %] 93 % (12/25 0748) Last BM Date: 05/26/20  Intake/Output from previous day: 12/24 0701 - 12/25 0700 In: -  Out: 1500 [Urine:1500] Intake/Output this shift: No intake/output data recorded.  PE: Gen: Awake and alert, NAD Lungs: Normal rate and effort Abd: Soft, ND, NT Wound: Right buttock wound measuring  8cm x 6cm x 6cm with 6cm tracking cephalad. No drainage from the wound. See picture below. Periwound is clean and without signs of infection.      Lab Results:  No results for input(s): WBC, HGB, HCT, PLT in the last 72 hours. BMET Recent Labs    05/26/20 0957  NA 140  K 3.6  CL 110  CO2 21*  GLUCOSE 170*  BUN 19  CREATININE 0.99  CALCIUM 7.3*   PT/INR No results for input(s): LABPROT, INR in the last 72 hours. CMP     Component Value Date/Time   NA 140 05/26/2020 0957   NA 137 07/06/2017 1442   K 3.6 05/26/2020 0957   CL 110 05/26/2020 0957   CO2 21 (L) 05/26/2020 0957   GLUCOSE 170 (H) 05/26/2020 0957   BUN 19 05/26/2020 0957   BUN 12 07/06/2017 1442   CREATININE 0.99 05/26/2020 0957   CREATININE 0.84 02/02/2016 1517   CALCIUM 7.3 (L) 05/26/2020 0957   PROT 5.9 (L) 05/23/2020 1549   ALBUMIN 2.7 (L) 05/23/2020 1549   ALBUMIN 4.4 07/06/2017 1442   AST 14 (L) 05/23/2020 1549   ALT 30 05/23/2020 1549   ALKPHOS 133 (H) 05/23/2020 1549   BILITOT 1.2 05/23/2020 1549   GFRNONAA >60 05/26/2020 0957   GFRNONAA 85 02/02/2016 1517   GFRAA >60 01/20/2019 2216   GFRAA >89 02/02/2016 1517   Lipase     Component Value Date/Time   LIPASE 17 05/23/2020 1549        Studies/Results: No results found.  Anti-infectives: Anti-infectives (From admission, onward)   Start     Dose/Rate Route Frequency Ordered Stop   05/26/20 1400  metroNIDAZOLE (FLAGYL) tablet 500 mg        500 mg Oral Every 8 hours 05/26/20 0853     05/25/20 2200  vancomycin (VANCOCIN) IVPB 1000 mg/200 mL premix        1,000 mg 200 mL/hr over 60 Minutes Intravenous Every 12 hours 05/25/20 0918     05/24/20 1800  vancomycin (VANCOREADY) IVPB 750 mg/150 mL  Status:  Discontinued        750 mg 150 mL/hr over 60 Minutes Intravenous Every 24 hours 05/23/20 1914 05/24/20 0811   05/24/20 1400  ceFEPIme (MAXIPIME) 2 g in sodium chloride 0.9 % 100 mL IVPB        2 g 200 mL/hr over 30 Minutes Intravenous Every 8 hours 05/24/20 0811     05/24/20 0900  vancomycin (VANCOCIN) IVPB 1000 mg/200 mL premix  Status:  Discontinued        1,000 mg 200 mL/hr over 60 Minutes Intravenous Every 12 hours 05/24/20 0811 05/25/20 0918   05/24/20 0800  ceFEPIme (MAXIPIME) 2 g in sodium chloride 0.9 % 100 mL IVPB  Status:  Discontinued        2 g 200 mL/hr over 30 Minutes Intravenous Every 12 hours 05/23/20 1914 05/24/20 0811   05/23/20 2200  clindamycin (CLEOCIN) IVPB 600 mg  Status:  Discontinued        600 mg 100 mL/hr over 30 Minutes Intravenous Every 8 hours 05/23/20 2129 05/26/20 0853   05/23/20 1830  vancomycin (VANCOREADY) IVPB 1500 mg/300 mL        1,500 mg 150 mL/hr over 120 Minutes Intravenous  Once 05/23/20 1813 05/23/20 2229   05/23/20 1800  ceFEPIme (MAXIPIME) 2 g in sodium chloride 0.9 % 100 mL IVPB        2 g 200 mL/hr over 30 Minutes Intravenous  Once 05/23/20 1750 05/23/20 1934   05/23/20 1800  metroNIDAZOLE (FLAGYL) IVPB 500 mg        500 mg 100 mL/hr over 60 Minutes Intravenous  Once 05/23/20 1750 05/23/20 2027   05/23/20 1800  vancomycin (VANCOCIN) IVPB 1000 mg/200 mL premix  Status:  Discontinued        1,000 mg 200 mL/hr over 60 Minutes Intravenous  Once 05/23/20 1750  05/23/20 1813       Assessment/Plan NSTI of the buttock and retroperitoneal abscess- S/P debridement by Dr. Derrell Lolling 12/20 & 12/22. Cont BID dressing changes with Dakins. WOC following. Add Hydrotherapy.  Acute hypoxic ventilator dependent respiratory failure-resolved. On RA.  Hx DM2 - SSI. Persistent hyperglycemia. Diabetic coordinator consult.  Hx CVA - on Plavix at home. Currently on hold. Will restart if hgb stable on repeat labs  Hx of gastric ulcer - Pepcid. Avoid nsaids HTN - home meds  FEN- CM diet. D/c IVF.  ID-cultures with Gardnerella Vaginalis. Report pending. Continue Cefepime/Flagyl/Vanc  Foley - Out. Voiding VTE- SCDs, Lovenox Dispo- Med surg, start hydrotherapy. Continue Dakins wtd bid. Repeat labs.   LOS: 5 days    Jacinto Halim , [facility] 05/28/2020, 10:29 AM Please see Amion for pager number during day hours 7:00am-4:30pm

## 2020-05-28 NOTE — Plan of Care (Signed)
  Problem: Education: Goal: Knowledge of General Education information will improve Description: Including pain rating scale, medication(s)/side effects and non-pharmacologic comfort measures Outcome: Progressing   Problem: Clinical Measurements: Goal: Will remain free from infection Outcome: Progressing   Problem: Activity: Goal: Risk for activity intolerance will decrease Outcome: Progressing   Problem: Nutrition: Goal: Adequate nutrition will be maintained Outcome: Progressing   Problem: Coping: Goal: Level of anxiety will decrease Outcome: Progressing   Problem: Pain Managment: Goal: General experience of comfort will improve Outcome: Progressing   Problem: Skin Integrity: Goal: Risk for impaired skin integrity will decrease Outcome: Progressing   

## 2020-05-28 NOTE — Progress Notes (Signed)
PT Cancellation Note  Patient Details Name: Kristie Tapia MRN: [id number] DOB: [birth year]   Cancelled Treatment:    Reason Eval/Treat Not Completed: Medical issues which prohibited therapy; patient just toileted and with open wound needing redressing on her buttocks.  Also reports nausea currently.  Will check later if time permits.    Elray Mcgregor 05/28/2020, 8:27 AM  Sheran Lawless, PT Acute Rehabilitation Services [phone] Office:[phone] 05/28/2020

## 2020-05-28 NOTE — Plan of Care (Signed)
Patient is s/p I&D on 12/22 and 12/20. Dsg changed tonight on my shift as ordered. Pain controlled with prn medications, gave Norco 5/325 x1 so far this shift. IV antibx infusing as ordered. Foley catheter still in place and draining WNL. On O2 at 3L [place], does not wear chronically at home to my knowledge. Will continue to monitor and continue current POC.

## 2020-05-29 ENCOUNTER — Encounter (HOSPITAL_COMMUNITY): Payer: Self-pay | Admitting: General Surgery

## 2020-05-29 DIAGNOSIS — M7989 Other specified soft tissue disorders: Secondary | ICD-10-CM | POA: Diagnosis not present

## 2020-05-29 LAB — BASIC METABOLIC PANEL
Anion gap: 7 (ref 5–15)
BUN: 15 mg/dL (ref 6–20)
CO2: 27 mmol/L (ref 22–32)
Calcium: 7.9 mg/dL — ABNORMAL LOW (ref 8.9–10.3)
Chloride: 105 mmol/L (ref 98–111)
Creatinine, Ser: 1.08 mg/dL — ABNORMAL HIGH (ref 0.44–1.00)
GFR, Estimated: >60 mL/min (ref >60)
Glucose, Bld: 195 mg/dL — ABNORMAL HIGH (ref 70–99)
Potassium: 4 mmol/L (ref 3.5–5.1)
Sodium: 139 mmol/L (ref 135–145)

## 2020-05-29 LAB — CBC
HCT: 37.4 % (ref 36.0–46.0)
Hemoglobin: 12.3 g/dL (ref 12.0–15.0)
MCH: 29.5 pg (ref 26.0–34.0)
MCHC: 32.9 g/dL (ref 30.0–36.0)
MCV: 89.7 fL (ref 80.0–100.0)
Platelets: 454 10*3/uL — ABNORMAL HIGH (ref 150–400)
RBC: 4.17 MIL/uL (ref 3.87–5.11)
RDW: 16.3 % — ABNORMAL HIGH (ref 11.5–15.5)
WBC: 23.3 10*3/uL — ABNORMAL HIGH (ref 4.0–10.5)
nRBC: 0.1 % (ref 0.0–0.2)

## 2020-05-29 LAB — GLUCOSE, CAPILLARY
Glucose-Capillary: 244 mg/dL — ABNORMAL HIGH (ref 70–99)
Glucose-Capillary: 176 mg/dL — ABNORMAL HIGH (ref 70–99)
Glucose-Capillary: 185 mg/dL — ABNORMAL HIGH (ref 70–99)
Glucose-Capillary: 198 mg/dL — ABNORMAL HIGH (ref 70–99)
Glucose-Capillary: 145 mg/dL — ABNORMAL HIGH (ref 70–99)
Glucose-Capillary: 167 mg/dL — ABNORMAL HIGH (ref 70–99)

## 2020-05-29 MED ORDER — CLOPIDOGREL BISULFATE 75 MG PO TABS
75.0000 mg | ORAL_TABLET | Freq: Every day | ORAL | Status: DC
  Administered 2020-05-29 – 2020-05-30 (×2): 75 mg via ORAL
  Filled 2020-05-29 (×2): qty 1

## 2020-05-29 MED ORDER — ONDANSETRON HCL 4 MG/2ML IJ SOLN
4.0000 mg | Freq: Four times a day (QID) | INTRAMUSCULAR | Status: DC | PRN
  Administered 2020-06-01: 4 mg via INTRAVENOUS
  Filled 2020-05-29: qty 2

## 2020-05-29 MED ORDER — PROMETHAZINE HCL 25 MG/ML IJ SOLN
25.0000 mg | Freq: Four times a day (QID) | INTRAMUSCULAR | Status: DC | PRN
  Administered 2020-05-29 – 2020-06-09 (×17): 25 mg via INTRAVENOUS
  Filled 2020-05-29 (×18): qty 1

## 2020-05-29 MED ORDER — SODIUM CHLORIDE 0.9 % IV BOLUS
500.0000 mL | Freq: Once | INTRAVENOUS | Status: AC
  Administered 2020-05-29: 500 mL via INTRAVENOUS

## 2020-05-29 MED ORDER — POTASSIUM CHLORIDE CRYS ER 20 MEQ PO TBCR
30.0000 meq | EXTENDED_RELEASE_TABLET | Freq: Once | ORAL | Status: AC
  Administered 2020-05-29: 30 meq via ORAL
  Filled 2020-05-29: qty 1

## 2020-05-29 MED ORDER — PROMETHAZINE HCL 25 MG RE SUPP
25.0000 mg | Freq: Four times a day (QID) | RECTAL | Status: DC | PRN
  Filled 2020-05-29: qty 1

## 2020-05-29 MED ORDER — ROSUVASTATIN CALCIUM 20 MG PO TABS
20.0000 mg | ORAL_TABLET | Freq: Every day | ORAL | Status: DC
  Administered 2020-05-29 – 2020-06-09 (×12): 20 mg via ORAL
  Filled 2020-05-29 (×12): qty 1

## 2020-05-29 MED ORDER — INSULIN ASPART 100 UNIT/ML ~~LOC~~ SOLN
0.0000 [IU] | Freq: Three times a day (TID) | SUBCUTANEOUS | Status: DC
  Administered 2020-05-29: 3 [IU] via SUBCUTANEOUS
  Administered 2020-05-29: 2 [IU] via SUBCUTANEOUS
  Administered 2020-05-30: 5 [IU] via SUBCUTANEOUS
  Administered 2020-05-30 – 2020-05-31 (×4): 3 [IU] via SUBCUTANEOUS
  Administered 2020-05-31: 2 [IU] via SUBCUTANEOUS
  Administered 2020-05-31 – 2020-06-01 (×4): 3 [IU] via SUBCUTANEOUS
  Administered 2020-06-01: 2 [IU] via SUBCUTANEOUS
  Administered 2020-06-01 – 2020-06-02 (×2): 5 [IU] via SUBCUTANEOUS
  Administered 2020-06-02: 2 [IU] via SUBCUTANEOUS
  Administered 2020-06-02: 8 [IU] via SUBCUTANEOUS
  Administered 2020-06-02: 5 [IU] via SUBCUTANEOUS
  Administered 2020-06-03 (×2): 3 [IU] via SUBCUTANEOUS
  Administered 2020-06-03: 5 [IU] via SUBCUTANEOUS
  Administered 2020-06-03: 3 [IU] via SUBCUTANEOUS
  Administered 2020-06-04: 5 [IU] via SUBCUTANEOUS
  Administered 2020-06-04: 2 [IU] via SUBCUTANEOUS
  Administered 2020-06-04: 8 [IU] via SUBCUTANEOUS
  Administered 2020-06-04: 3 [IU] via SUBCUTANEOUS
  Administered 2020-06-05: 2 [IU] via SUBCUTANEOUS
  Administered 2020-06-05: 5 [IU] via SUBCUTANEOUS
  Administered 2020-06-05: 3 [IU] via SUBCUTANEOUS
  Administered 2020-06-06: 8 [IU] via SUBCUTANEOUS
  Administered 2020-06-06: 2 [IU] via SUBCUTANEOUS
  Administered 2020-06-06 (×2): 3 [IU] via SUBCUTANEOUS
  Administered 2020-06-07 (×3): 2 [IU] via SUBCUTANEOUS
  Administered 2020-06-08 (×3): 3 [IU] via SUBCUTANEOUS
  Administered 2020-06-08: 5 [IU] via SUBCUTANEOUS
  Administered 2020-06-09: 3 [IU] via SUBCUTANEOUS
  Administered 2020-06-09: 8 [IU] via SUBCUTANEOUS
  Administered 2020-06-09: 2 [IU] via SUBCUTANEOUS
  Administered 2020-06-10: 5 [IU] via SUBCUTANEOUS
  Administered 2020-06-10: 3 [IU] via SUBCUTANEOUS

## 2020-05-29 MED ORDER — INSULIN GLARGINE 100 UNIT/ML ~~LOC~~ SOLN
18.0000 [IU] | Freq: Every day | SUBCUTANEOUS | Status: DC
  Administered 2020-05-29 – 2020-05-31 (×3): 18 [IU] via SUBCUTANEOUS
  Filled 2020-05-29 (×3): qty 0.18

## 2020-05-29 MED ORDER — PROMETHAZINE HCL 25 MG PO TABS: 25.0000 mg | ORAL_TABLET | Freq: Four times a day (QID) | ORAL | Status: DC | PRN

## 2020-05-29 MED ORDER — PANTOPRAZOLE SODIUM 40 MG PO TBEC
40.0000 mg | DELAYED_RELEASE_TABLET | Freq: Two times a day (BID) | ORAL | Status: DC
  Administered 2020-05-29 – 2020-06-10 (×24): 40 mg via ORAL
  Filled 2020-05-29 (×26): qty 1

## 2020-05-29 NOTE — Progress Notes (Signed)
Inpatient Diabetes Program Recommendations  AACE/ADA: New Consensus Statement on Inpatient Glycemic Control (2015)  Target Ranges:  Prepandial:   less than 140 mg/dL      Peak postprandial:   less than 180 mg/dL (1-2 hours)      Critically ill patients:  140 - 180 mg/dL   Lab Results  Component Value Date   GLUCAP 185 (H) 05/29/2020   HGBA1C 8.9 (H) 05/25/2020    Review of Glycemic Control Results for Kristie Tapia, Kristie Tapia (MRN [id number]) as of 05/29/2020 10:14  Ref. Range 05/28/2020 19:22 05/29/2020 01:27 05/29/2020 03:44 05/29/2020 08:42  Glucose-Capillary Latest Ref Range: 70 - 99 mg/dL 426 (H) 834 (H) 196 (H) 185 (H)  Diabetes history:  DM2 Outpatient Diabetes medications:  Actos 45 units daily Humalog 10-15 units BID Current orders for Inpatient glycemic control: Novolog moderate q 4 hours  Inpatient Diabetes Program Recommendations:    Please consider adding Lantus 18 units daily.  Change Novolog correction to tid with meals and HS.    Thanks,  Beryl Meager, RN, BC-ADM Inpatient Diabetes Coordinator Pager [phone]

## 2020-05-29 NOTE — Plan of Care (Signed)
No acute events since the pervious night that I took care of her. Dsg change done tonight at 2230 with dakins soaked kerlix packed in wound and covered with two ABD pads and medipore tape. Pain and nausea controlled with prn medications as ordered. Will continue to monitor and continue current POC.

## 2020-05-29 NOTE — Progress Notes (Addendum)
4 Days Post-Op  Subjective: CC: Tolerating hydrotherapy and dressing changes.  Tolerating diet without emesis. BM yesterday. Voiding without difficulty.  Only getting up to bedside commode.  Lives at home with husband and daughter lives next door. She thinks her daughter would be able to help with dressing changes at d/c.   Objective: Vital signs in last 24 hours: Temp:  [97.8 F (36.6 C)-98.9 F (37.2 C)] 97.8 F (36.6 C) (12/26 0847) Pulse Rate:  [65-74] 68 (12/26 0847) Resp:  [15-18] 18 (12/26 0847) BP: (95-121)/(49-57) 95/52 (12/26 0847) SpO2:  [93 %-96 %] 94 % (12/26 0847) Last BM Date: 05/28/20  Intake/Output from previous day: 12/25 0701 - 12/26 0700 In: 480 [P.O.:480] Out: -  Intake/Output this shift: No intake/output data recorded.  PE: Gen: Awake and alert, NAD Lungs: Normal rate and effort Abd: Soft, ND, NT Wound: Right buttock wound measuring  8cm x 6cm x 6cm with 6cm tracking cephalad. No drainage from the wound. See picture below. Periwound is clean and without signs of infection.      Lab Results:  Recent Labs    05/28/20 1155  WBC 21.1*  HGB 12.5  HCT 37.4  PLT 375   BMET Recent Labs    05/28/20 1155  NA 136  K 3.3*  CL 105  CO2 24  GLUCOSE 211*  BUN 16  CREATININE 1.00  CALCIUM 7.7*   PT/INR No results for input(s): LABPROT, INR in the last 72 hours. CMP     Component Value Date/Time   NA 136 05/28/2020 1155   NA 137 07/06/2017 1442   K 3.3 (L) 05/28/2020 1155   CL 105 05/28/2020 1155   CO2 24 05/28/2020 1155   GLUCOSE 211 (H) 05/28/2020 1155   BUN 16 05/28/2020 1155   BUN 12 07/06/2017 1442   CREATININE 1.00 05/28/2020 1155   CREATININE 0.84 02/02/2016 1517   CALCIUM 7.7 (L) 05/28/2020 1155   PROT 5.9 (L) 05/23/2020 1549   ALBUMIN 2.7 (L) 05/23/2020 1549   ALBUMIN 4.4 07/06/2017 1442   AST 14 (L) 05/23/2020 1549   ALT 30 05/23/2020 1549   ALKPHOS 133 (H) 05/23/2020 1549   BILITOT 1.2 05/23/2020 1549   GFRNONAA  >60 05/28/2020 1155   GFRNONAA 85 02/02/2016 1517   GFRAA >60 01/20/2019 2216   GFRAA >89 02/02/2016 1517   Lipase     Component Value Date/Time   LIPASE 17 05/23/2020 1549       Studies/Results: No results found.  Anti-infectives: Anti-infectives (From admission, onward)   Start     Dose/Rate Route Frequency Ordered Stop   05/26/20 1400  metroNIDAZOLE (FLAGYL) tablet 500 mg        500 mg Oral Every 8 hours 05/26/20 0853     05/25/20 2200  vancomycin (VANCOCIN) IVPB 1000 mg/200 mL premix        1,000 mg 200 mL/hr over 60 Minutes Intravenous Every 12 hours 05/25/20 0918     05/24/20 1800  vancomycin (VANCOREADY) IVPB 750 mg/150 mL  Status:  Discontinued        750 mg 150 mL/hr over 60 Minutes Intravenous Every 24 hours 05/23/20 1914 05/24/20 0811   05/24/20 1400  ceFEPIme (MAXIPIME) 2 g in sodium chloride 0.9 % 100 mL IVPB        2 g 200 mL/hr over 30 Minutes Intravenous Every 8 hours 05/24/20 0811     05/24/20 0900  vancomycin (VANCOCIN) IVPB 1000 mg/200 mL premix  Status:  Discontinued  1,000 mg 200 mL/hr over 60 Minutes Intravenous Every 12 hours 05/24/20 0811 05/25/20 0918   05/24/20 0800  ceFEPIme (MAXIPIME) 2 g in sodium chloride 0.9 % 100 mL IVPB  Status:  Discontinued        2 g 200 mL/hr over 30 Minutes Intravenous Every 12 hours 05/23/20 1914 05/24/20 0811   05/23/20 2200  clindamycin (CLEOCIN) IVPB 600 mg  Status:  Discontinued        600 mg 100 mL/hr over 30 Minutes Intravenous Every 8 hours 05/23/20 2129 05/26/20 0853   05/23/20 1830  vancomycin (VANCOREADY) IVPB 1500 mg/300 mL        1,500 mg 150 mL/hr over 120 Minutes Intravenous  Once 05/23/20 1813 05/23/20 2229   05/23/20 1800  ceFEPIme (MAXIPIME) 2 g in sodium chloride 0.9 % 100 mL IVPB        2 g 200 mL/hr over 30 Minutes Intravenous  Once 05/23/20 1750 05/23/20 1934   05/23/20 1800  metroNIDAZOLE (FLAGYL) IVPB 500 mg        500 mg 100 mL/hr over 60 Minutes Intravenous  Once 05/23/20 1750  05/23/20 2027   05/23/20 1800  vancomycin (VANCOCIN) IVPB 1000 mg/200 mL premix  Status:  Discontinued        1,000 mg 200 mL/hr over 60 Minutes Intravenous  Once 05/23/20 1750 05/23/20 1813       Assessment/Plan NSTI of the buttock and retroperitoneal abscess- S/P debridement by Dr. Derrell Lolling 12/20 & 12/22. Cont BID dressing changes with Dakins.  Hydrotherapy. Monitor closely to ensure does not need to go back to the OR. Needs to mobilize (PT eval). Pulm toilet.  Acute hypoxic ventilator dependent respiratory failure-resolved. Wean o2. Hx DM2 - A1c 8.9. SSI. Persistent hyperglycemia. Diabetic coordinator consult. Changes made per their recs. Hx CVA - on Plavix at home. Currently on hold until know she will not require further surgery.  Hx of gastric ulcer - Pepcid. Avoid nsaids Mult psych meds - home meds  FEN-CM diet. NS bolus for soft bp. Recheck hgb  ID-cultures with Gardnerella Vaginalis. Report pending. Continue Cefepime/Flagyl/Vanc  Foley- Out. Voiding VTE- SCDs, Lovenox Dispo-Med surg, hydrotherapy and bid dressing change. Repeat labs. TRH consult.   LOS: 6 days    Jacinto Halim , [facility] 05/29/2020, 11:06 AM Please see Amion for pager number during day hours 7:00am-4:30pm

## 2020-05-29 NOTE — Progress Notes (Signed)
Physical Therapy Wound Evaluation and Treatment Patient Details  Name: Kristie Tapia MRN: [id number] Date of Birth: 10/18/1970  Today's Date: 05/29/2020 Time: 6270-3500 Time Calculation (min): 40 min  Subjective  Subjective: My butt is sore Patient and Family Stated Goals: Avoid another surgery Date of Onset: 05/23/20 (first I&D) Prior Treatments: I&D in OR x 2  Pain Score: Pain Score: 6   Wound Assessment  Wound / Incision (Open or Dehisced) 05/24/20 Incision -  Buttocks Right (Active)  Dressing Type ABD;Gauze (Comment);Moist to moist;Moisture barrier;Other (Comment) 05/29/20 0924  Dressing Changed Changed 05/29/20 0924  Dressing Status Clean;Dry;Intact 05/29/20 0924  Dressing Change Frequency Twice a day 05/29/20 0924  Site / Wound Assessment Brown;Dusky;Granulation tissue;Painful;Pale;Yellow 05/29/20 0924  % Wound base Red or Granulating 20% 05/29/20 0924  % Wound base Yellow/Fibrinous Exudate 30% 05/29/20 0924  % Wound base Other/Granulation Tissue (Comment) 50% 05/29/20 0924  Peri-wound Assessment Intact;Edema;Induration 05/29/20 0924  Wound Length (cm) 11.5 cm 05/29/20 0924  Wound Width (cm) 5.8 cm 05/29/20 0924  Wound Depth (cm) 10 cm 05/29/20 0924  Wound Volume (cm^3) 667 cm^3 05/29/20 0924  Wound Surface Area (cm^2) 66.7 cm^2 05/29/20 0924  Tunneling (cm) 12:00 7.5 cm 05/29/20 0924  Margins Unattached edges (unapproximated) 05/29/20 0924  Closure None 05/29/20 0924  Drainage Amount Minimal 05/29/20 0924  Drainage Description Purulent 05/29/20 0924  Treatment Hydrotherapy (Pulse lavage);Other (Comment) 05/29/20 0924   Hydrotherapy Pulsed lavage therapy - wound location: rt buttock Pulsed Lavage with Suction (psi): 12 psi Pulsed Lavage with Suction - Normal Saline Used: 1000 mL Pulsed Lavage Tip: Tip with splash shield   Wound Assessment and Plan  Wound Therapy - Assess/Plan/Recommendations Wound Therapy - Clinical Statement: Patient with rt buttock abscess s/p  I&D in OR x 2. Area remains indurated, contaminated with feces, very deep, and with purulent drainage. Per patient, "this is what is going to help keep me from having to go back to the OR?" Wound can benefit from hydrotherapy, however question if she will require further debridement in the OR. Wound Therapy - Functional Problem List: mobility limited due to pain and inadvisable to sit for prolonged periods due to wound location Factors Delaying/Impairing Wound Healing: Infection - systemic/local;Immobility;Diabetes Mellitus Hydrotherapy Plan: Debridement;Dressing change;Patient/family education;Pulsatile lavage with suction Wound Therapy - Frequency: 6X / week Wound Therapy - Follow Up Recommendations: Home health RN Wound Plan: see above  Wound Therapy Goals- Improve the function of patient's integumentary system by progressing the wound(s) through the phases of wound healing (inflammation - proliferation - remodeling) by: Decrease Necrotic Tissue to: <40% Decrease Necrotic Tissue - Progress: Goal set today Increase Granulation Tissue to: >40% (allowing 20% for healthy fat) Increase Granulation Tissue - Progress: Goal set today Decrease Length/Width/Depth by (cm): 0/0/1.0xm Decrease Length/Width/Depth - Progress: Goal set today Improve Drainage Characteristics: Min;Serous Improve Drainage Characteristics - Progress: Goal set today Patient/Family will be able to : verbalize wound dressing application to instruct future caregivers Patient/Family Instruction Goal - Progress: Goal set today Goals/treatment plan/discharge plan were made with and agreed upon by patient/family: Yes Time For Goal Achievement: 2 weeks Wound Therapy - Potential for Goals: Fair  Goals will be updated until maximal potential achieved or discharge criteria met.  Discharge criteria: when goals achieved, discharge from hospital, MD decision/surgical intervention, no progress towards goals, refusal/missing three consecutive  treatments without notification or medical reason.  GP     Arby Barrette, PT Pager [phone]   Rexanne Mano 05/29/2020, 9:48 AM

## 2020-05-29 NOTE — Consult Note (Signed)
Medical Consultation   Kristie Tapia  JOI:[id number]  DOB: 1970/06/06  DOA: 05/23/2020  PCP: System, Provider Not In   Outpatient Specialists: Allena Katz - endocrinology; Bintrim - pain management; Mills-Howeel - psychiatry; Afuom.Littler - neurology   Requesting physician: Tsuei - surgery  Reason for consultation: Necrotizing soft tissue infection of buttocks, s/p debridement twice.  Needs medical management.  Diabetes coordinator consulted as well.  History of Present Illness: Kristie Tapia is an 49 y.o. female with h/o DM; CVA; OSA; and chronic pain who was admitted on 12/20 for necrotizing fasciitis of the R buttock.  She is awake and alert.  She reported that she started with a boil on her buttock and tried to pop it.  It spread and she tried to lance it without success.  She as having abdominal pain as well and went to urgent care and eventually had a CT showing severe infection and so came in and had debridement.  She is currently waiting for her nurse to clean her up and then her daughter is going to wash her hair.  She is having persistent buttocks pain.  She also wants to know why she isn't being given Phenergan since she has told multiple people that Zofran doesn't work for her and her husband is getting very angry that she isn't being given Phenergan.  Her biggest current complaint is indigestion.    Review of Systems:  Review of Systems  HENT: Positive for congestion.    As per HPI otherwise 10 point review of systems negative.    Past Medical History: Past Medical History:  Diagnosis Date  . Anemia   . Anxiety   . Complication of anesthesia ` 1998   "lungs collapsed during LEEP procedure"  . Gallstones   . GERD (gastroesophageal reflux disease)   . H/O hiatal hernia   . History of stomach ulcers   . Migraines   . Sleep apnea   . Stroke (HCC)   . Type II diabetes mellitus (HCC)     Past Surgical History: Past Surgical History:  Procedure  Laterality Date  . ABDOMINAL EXPLORATION SURGERY     "I've had several"  . ABDOMINAL HYSTERECTOMY  08/1998  . APPENDECTOMY  01/03/2012  . DILATION AND CURETTAGE OF UTERUS    . INCISION AND DRAINAGE ABSCESS N/A 05/25/2020   Procedure: INCISION AND DRAINAGE BUTTOCK ABSCESS;  Surgeon: Axel Filler, MD;  Location: [facility] OR;  Service: General;  Laterality: N/A;  . INCISION AND DRAINAGE PERIRECTAL ABSCESS N/A 05/23/2020   Procedure: IRRIGATION AND DEBRIDEMENT BUTTOCKS ABSCESS and retroperitoneum (N/A);  Surgeon: Axel Filler, MD;  Location: [facility] OR;  Service: General;  Laterality: N/A;  . LAPAROSCOPIC APPENDECTOMY  01/03/2012   Procedure: APPENDECTOMY LAPAROSCOPIC;  Surgeon: Mariella Saa, MD;  Location: [facility] OR;  Service: General;  Laterality: N/A;  . LEEP  1998   "lungs collapsed"  . TUBAL LIGATION  05/1998     Allergies:   Allergies  Allergen Reactions  . Sulfa Antibiotics Other (See Comments)    Burns the inside of my mouth; "blisters; leaves tongue and inside of mouth solid red"  . Tape Other (See Comments)    Blisters- USE PAPER TAPE ONLY  . Decongestant [Pseudoephedrine Hcl Er] Itching    ALL DECONGESTANTS  . Duloxetine Other (See Comments)    Felt "out of it"  . Fremanezumab-Vfrm Other (See Comments) and Nausea Only  . Tizanidine Other (See Comments)  headache  . Chlorpheniramine Itching  . Red Dye Other (See Comments)    CAUSES MIGRAINES  . Eggs Or Egg-Derived Products Nausea And Vomiting  . Latex Itching  . Percocet [Oxycodone-Acetaminophen] Other (See Comments)    PT reports having nightmares when taking Percocet  . Prednisone Other (See Comments)    "Gives me a bad attitude"  . Zoloft [Sertraline Hcl] Rash     Social History:  reports that she has been smoking cigarettes. She has a 29.00 pack-year smoking history. She has never used smokeless tobacco. She reports that she does not drink alcohol and does not use drugs.   Family History: Family History   Problem Relation Age of Onset  . Breast cancer Other   . Diabetes Paternal Grandfather       Physical Exam: Vitals:   05/28/20 1321 05/28/20 1923 05/29/20 0300 05/29/20 0847  BP: (!) 121/57 (!) 96/49 (!) 100/53 (!) 95/52  Pulse: 65 74 69 68  Resp: 18 15 16 18   Temp:  98.9 F (37.2 C) 98.9 F (37.2 C) 97.8 F (36.6 C)  TempSrc:   Oral Oral  SpO2: 96% 93% 95% 94%  Weight:      Height:        Constitutional: Alert and awake, oriented x3, not in any acute distress. Eyes: EOMI, irises appear normal, anicteric sclera,  ENMT: external ears and nose appear normal, normal hearing, Lips appear normal, oropharynx mucosa, tongue appear normal; edentulous  Neck: neck appears normal, no masses, normal ROM CVS: S1-S2 clear, no murmur rubs or gallops, no LE edema Respiratory:  clear to auscultation bilaterally, no wheezing, rales or rhonchi. Respiratory effort normal. No accessory muscle use.  Abdomen: soft, diffusely tender to palpation Musculoskeletal: : no cyanosis, clubbing or edema noted bilaterally; SCDs in place Neuro: Cranial nerves II-XII intact Psych: judgement and insight appear normal, stable mood and affect, mental status Skin: buttocks wound picture noted but not personally examined     Data reviewed:  I have personally reviewed the recent labs and imaging studies  Pertinent Labs:   12/25 labs: K+ 3.3 Glucose 211, 234; this AM, 244, 176, 185, 198 WBC 21.1 -> 23.3   Inpatient Medications:   Scheduled Meds: . Chlorhexidine Gluconate Cloth  6 each Topical Daily  . donepezil  10 mg Oral Daily  . enoxaparin (LOVENOX) injection  40 mg Subcutaneous Q24H  . escitalopram  20 mg Oral QHS  . famotidine  40 mg Oral QHS  . feeding supplement  237 mL Oral BID BM  . furosemide  20 mg Oral Daily  . insulin aspart  0-15 Units Subcutaneous TID WC & HS  . insulin glargine  18 Units Subcutaneous Daily  . metroNIDAZOLE  500 mg Oral Q8H  . multivitamin with minerals  1 tablet  Oral Daily  . pregabalin  100 mg Oral q AM  . pregabalin  200 mg Oral QHS  . sodium hypochlorite   Irrigation BID  . traZODone  150 mg Oral QHS   Continuous Infusions: . ceFEPime (MAXIPIME) IV 2 g (05/29/20 0554)  . vancomycin 1,000 mg (05/29/20 1016)     Radiological Exams on Admission: No results found.  Impression/Recommendations Principal Problem:   Necrotizing soft tissue infection Active Problems:   Dyslipidemia   Diabetes (HCC)   Smoker unmotivated to quit   CVA (cerebral vascular accident) (HCC)   Obesity   Chronic musculoskeletal pain  Necrotizing soft tissue infection of the buttocks -Patient with pustule which she attempted to  manipulate/drain -Resultant necrotizing soft tissue infection -s/p I&D on 12/20, 12/22 -Remains on Vanc/Cefepime/Flagyl -Hydrotherapy and dressing changes with Dakins per surgery -There is no current plan for return to the OR -Needs ambulation  DM -A1c 8.9 -hold Actos -Continue Lantus -Cover with moderate-scale SSI -Diabetes coordinator is on board -Consider adding standing meal coverage and qhs coverage if not achieving goal result since it is hard to obtain wound healing with suboptimal glucose control  HLD -Resume Crestor 20 mg qhs  H/o CVA -Resume Plavix since there is no further plan for operative intervention  GERD -Continue Pepcid -Protonix, usually BID, is on hold; will resume -prn Maalox -Continue Zofran  Chronic pain -I have reviewed this patient in the [place] Controlled Substances Reporting System.  She is receiving medications from multiple providers but appears to be taking them as prescribed. -She is not at particularly high risk of opioid misuse, diversion, or overdose. 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   -Weight loss should be encouraged -Outpatient PCP/bariatric medicine/bariatric surgery f/u encouraged     Thank you for this consultation.  Our [facility] hospitalist team will sign off at this time.  Please reconsult should additional medical needs arise.   Time Spent: 50 minutes  Jonah Blue M.D. [facility] 05/29/2020, 1:09 PM

## 2020-05-29 NOTE — Progress Notes (Signed)
Pharmacy Antibiotic Note  Kristie Tapia is a 49 y.o. female admitted on 05/23/2020 with necrotizing soft tissue infection of buttock extending above pelvic wall into retroperitoneal space.   Patient is s/p I&D on 12/20 and 12/22. WBC remains elevated at 21.1. Scr stable and patient is afebrile. Wound cultures and sensitivities still pending. Will continue Vancomycin, cefepime and metronidazole.   Plan: - Continue cefepime 2 gm IV every 8 hours - Continue vancomycin 1g IV every 12 hrs - Continue Flagyl PO every 8 hours per MD - Monitor CBC, renal fx, cultures and clinical progress - Repeat vancomycin levels weekly and as indicated; next level due 12/30  Height: 5\' 5"  (165.1 cm) Weight: 92.1 kg (203 lb 0.7 oz) IBW/kg (Calculated) : 57  Temp (24hrs), Avg:98.5 F (36.9 C), Min:97.8 F (36.6 C), Max:98.9 F (37.2 C)  Recent Labs  Lab 05/23/20 1549 05/23/20 1900 05/23/20 2354 05/24/20 0217 05/25/20 0323 05/26/20 0957 05/28/20 1155  WBC 21.4*  --   --  20.9* 18.4*  --  21.1*  CREATININE 1.64*  --  0.90 1.25* 1.12* 0.99 1.00  LATICACIDVEN  --  2.0*  --  1.9  --   --   --   VANCOTROUGH  --   --   --   --   --  17  --     Estimated Creatinine Clearance: 76.3 mL/min (by C-G formula based on SCr of 1 mg/dL).    Allergies  Allergen Reactions  . Sulfa Antibiotics Other (See Comments)    Burns the inside of my mouth; "blisters; leaves tongue and inside of mouth solid red"  . Tape Other (See Comments)    Blisters- USE PAPER TAPE ONLY  . Decongestant [Pseudoephedrine Hcl Er] Itching    ALL DECONGESTANTS  . Duloxetine Other (See Comments)    Felt "out of it"  . Fremanezumab-Vfrm Other (See Comments) and Nausea Only  . Tizanidine Other (See Comments)    headache  . Chlorpheniramine Itching  . Red Dye Other (See Comments)    CAUSES MIGRAINES  . Eggs Or Egg-Derived Products Nausea And Vomiting  . Latex Itching  . Percocet [Oxycodone-Acetaminophen] Other (See Comments)    PT  reports having nightmares when taking Percocet  . Prednisone Other (See Comments)    "Gives me a bad attitude"  . Zoloft [Sertraline Hcl] Rash    Antimicrobials this admission: Cefepime 12/20 >>  Vanc 12/20 >>  Clindamycin 12/20 >> 12/23 Flagyl 12/23 >>  Microbiology results: 12/21 MRSA PCR: negative 12/21 Buttock tissue: few GNR, rare GPC, GPR > few Gardnerella vaginalis, pending 12/20 UCx: negative 12/20 BCx: negative     Mackie Holness L. 1/21, PharmD, MBA [facility] PGY2 Pharmacy Resident Weekends 7:00 am - 3:00 pm, please call [phone] 05/29/20     9:12 AM  Please check AMION for all [facility] Pharmacy phone numbers After 10:00 PM, call the Main Pharmacy [phone]

## 2020-05-30 LAB — GLUCOSE, CAPILLARY
Glucose-Capillary: 161 mg/dL — ABNORMAL HIGH (ref 70–99)
Glucose-Capillary: 167 mg/dL — ABNORMAL HIGH (ref 70–99)
Glucose-Capillary: 168 mg/dL — ABNORMAL HIGH (ref 70–99)
Glucose-Capillary: 170 mg/dL — ABNORMAL HIGH (ref 70–99)
Glucose-Capillary: 180 mg/dL — ABNORMAL HIGH (ref 70–99)
Glucose-Capillary: 201 mg/dL — ABNORMAL HIGH (ref 70–99)
Glucose-Capillary: 206 mg/dL — ABNORMAL HIGH (ref 70–99)

## 2020-05-30 LAB — CBC
HCT: 33.7 % — ABNORMAL LOW (ref 36.0–46.0)
Hemoglobin: 11.9 g/dL — ABNORMAL LOW (ref 12.0–15.0)
MCH: 31.2 pg (ref 26.0–34.0)
MCHC: 35.3 g/dL (ref 30.0–36.0)
MCV: 88.2 fL (ref 80.0–100.0)
Platelets: 403 10*3/uL — ABNORMAL HIGH (ref 150–400)
RBC: 3.82 MIL/uL — ABNORMAL LOW (ref 3.87–5.11)
RDW: 16.4 % — ABNORMAL HIGH (ref 11.5–15.5)
WBC: 21.6 10*3/uL — ABNORMAL HIGH (ref 4.0–10.5)
nRBC: 0 % (ref 0.0–0.2)

## 2020-05-30 LAB — BASIC METABOLIC PANEL
Anion gap: 8 (ref 5–15)
BUN: 13 mg/dL (ref 6–20)
CO2: 26 mmol/L (ref 22–32)
Calcium: 7.8 mg/dL — ABNORMAL LOW (ref 8.9–10.3)
Chloride: 107 mmol/L (ref 98–111)
Creatinine, Ser: 1.06 mg/dL — ABNORMAL HIGH (ref 0.44–1.00)
GFR, Estimated: >60 mL/min (ref >60)
Glucose, Bld: 193 mg/dL — ABNORMAL HIGH (ref 70–99)
Potassium: 3.1 mmol/L — ABNORMAL LOW (ref 3.5–5.1)
Sodium: 141 mmol/L (ref 135–145)

## 2020-05-30 LAB — MAGNESIUM: Magnesium: 1.9 mg/dL (ref 1.7–2.4)

## 2020-05-30 MED ORDER — POTASSIUM CHLORIDE CRYS ER 20 MEQ PO TBCR
40.0000 meq | EXTENDED_RELEASE_TABLET | Freq: Two times a day (BID) | ORAL | Status: AC
  Administered 2020-05-30 (×2): 40 meq via ORAL
  Filled 2020-05-30 (×2): qty 2

## 2020-05-30 NOTE — Plan of Care (Signed)

## 2020-05-30 NOTE — Progress Notes (Signed)
Physical Therapy Wound Treatment Patient Details  Name: Kristie Tapia MRN: [id number] Date of Birth: [birth year]  Today's Date: 05/30/2020 Time: 1430-1500 Time Calculation (min): 30 min  Subjective  Subjective: My butt is sore Patient and Family Stated Goals: Avoid another surgery Date of Onset: 05/23/20 (first I&D) Prior Treatments: I&D in OR x 2  Pain Score: Premedicated with IV pain meds and still reports significant pain  Wound Assessment  Wound / Incision (Open or Dehisced) 05/24/20 Incision - Dehisced Buttocks Right (Active)  Wound Image   05/30/20 1511  Dressing Type ABD;Barrier Film (skin prep);Gauze (Comment);Moist to dry 05/30/20 1511  Dressing Changed Changed 05/30/20 1511  Dressing Status Clean;Dry;Intact 05/30/20 1511  Dressing Change Frequency Daily 05/30/20 1511  Site / Wound Assessment Yellow;Black;Brown;Dusky;Pink 05/30/20 1511  % Wound base Red or Granulating 20% 05/30/20 1511  % Wound base Yellow/Fibrinous Exudate 60% 05/30/20 1511  % Wound base Black/Eschar 20% 05/30/20 1511  % Wound base Other/Granulation Tissue (Comment) 0% 05/30/20 1511  Peri-wound Assessment Intact 05/30/20 1511  Wound Length (cm) 11.5 cm 05/29/20 0924  Wound Width (cm) 5.8 cm 05/29/20 0924  Wound Depth (cm) 10 cm 05/29/20 0924  Wound Volume (cm^3) 667 cm^3 05/29/20 0924  Wound Surface Area (cm^2) 66.7 cm^2 05/29/20 0924  Tunneling (cm) 12:00 7.5 cm 05/29/20 0924  Margins Unattached edges (unapproximated) 05/30/20 1511  Closure None 05/30/20 1511  Drainage Amount Copious 05/30/20 1511  Drainage Description Purulent 05/30/20 1511  Treatment Debridement (Selective);Hydrotherapy (Pulse lavage);Packing (Dakin's soaked gauze) 05/30/20 1511   Hydrotherapy Pulsed lavage therapy - wound location: rt buttock Pulsed Lavage with Suction (psi): 12 psi Pulsed Lavage with Suction - Normal Saline Used: 1000 mL Pulsed Lavage Tip: Tip with splash shield Selective Debridement Selective  Debridement - Location: rt buttock Selective Debridement - Tools Used: Forceps;Scissors Selective Debridement - Tissue Removed: yellow and black necrotic adipose and unviable tissue   Wound Assessment and Plan  Wound Therapy - Assess/Plan/Recommendations Wound Therapy - Clinical Statement: Pain limiting treatment despite IV premedication. Requested an air mattress to protect skin integrity. Wound is very large and difficult to fully visualize to debride. Will continue to follow for selective removal of unviable tissue, to decrease bioburden and promote wound bed healing. Wound Therapy - Functional Problem List: mobility limited due to pain and inadvisable to sit for prolonged periods due to wound location Factors Delaying/Impairing Wound Healing: Infection - systemic/local;Immobility;Diabetes Mellitus Hydrotherapy Plan: Debridement;Dressing change;Patient/family education;Pulsatile lavage with suction Wound Therapy - Frequency: 6X / week Wound Therapy - Follow Up Recommendations: Home health RN Wound Plan: see above  Wound Therapy Goals- Improve the function of patient's integumentary system by progressing the wound(s) through the phases of wound healing (inflammation - proliferation - remodeling) by: Decrease Necrotic Tissue to: <40% Decrease Necrotic Tissue - Progress: Progressing toward goal Increase Granulation Tissue to: >40% (allowing 20% for healthy fat) Increase Granulation Tissue - Progress: Progressing toward goal Decrease Length/Width/Depth by (cm): 0/0/1.0cm Decrease Length/Width/Depth - Progress: Progressing toward goal Improve Drainage Characteristics: Min;Serous Improve Drainage Characteristics - Progress: Progressing toward goal Patient/Family will be able to : verbalize wound dressing application to instruct future caregivers Patient/Family Instruction Goal - Progress: Progressing toward goal Goals/treatment plan/discharge plan were made with and agreed upon by  patient/family: Yes Time For Goal Achievement: 7 days Wound Therapy - Potential for Goals: Fair  Goals will be updated until maximal potential achieved or discharge criteria met.  Discharge criteria: when goals achieved, discharge from hospital, MD decision/surgical intervention, no progress towards goals, refusal/missing three  consecutive treatments without notification or medical reason.  GP     Thelma Comp 05/30/2020, 3:23 PM  Rolinda Roan, PT, DPT Acute Rehabilitation Services Pager: [phone] Office: [phone]

## 2020-05-30 NOTE — Progress Notes (Signed)
PT Cancellation Note  Patient Details Name: Kristie Tapia MRN: [id number] DOB: 1971/05/24   Cancelled Treatment:    Reason Eval/Treat Not Completed: Other (comment) Attempted to see patient x2. Pt was on BSC, when PT returned pt was in L sidelying with open R buttocks wound. Pt's wound fell out while on commode and was waiting for RN to come pack it. Pt with drainage from wound and unable to ambulate safely or sanitarily without wound packed. PT to return as able.  Lewis Shock, PT, DPT Acute Rehabilitation Services Pager #: [phone] Office #: [phone]    Kristie Tapia 05/30/2020, 12:56 PM

## 2020-05-30 NOTE — Progress Notes (Signed)
[facility] Progress Note  5 Days Post-Op  Subjective: CC-  Still having a lot of pain at surgical site, but tolerates dressing changes well.  WBC slightly down 21.6 from 23.3, TMAX 99.4  Objective: Vital signs in last 24 hours: Temp:  [98.4 F (36.9 C)-99.4 F (37.4 C)] 99.1 F (37.3 C) (12/27 0805) Pulse Rate:  [68-73] 72 (12/27 0805) Resp:  [16-18] 18 (12/27 0805) BP: (111-142)/(46-64) 121/46 (12/27 0805) SpO2:  [92 %-98 %] 93 % (12/27 0805) Last BM Date: 05/29/20  Intake/Output from previous day: No intake/output data recorded. Intake/Output this shift: No intake/output data recorded.  PE: Gen: Awake and alert, NAD Lungs: Normal rate and effort Abd: Soft, ND, NT Wound: Right buttock wound measuring 8cm x 6cm x 6cm with 9cm tracking cephalad - able to break into some more pus in this location. See picture below. Periwound is clean and without cellulitis, she does have some induration that follows the area of tracking     Lab Results:  Recent Labs    05/29/20 1241 05/30/20 0236  WBC 23.3* 21.6*  HGB 12.3 11.9*  HCT 37.4 33.7*  PLT 454* 403*   BMET Recent Labs    05/29/20 1241 05/30/20 0236  NA 139 141  K 4.0 3.1*  CL 105 107  CO2 27 26  GLUCOSE 195* 193*  BUN 15 13  CREATININE 1.08* 1.06*  CALCIUM 7.9* 7.8*   PT/INR No results for input(s): LABPROT, INR in the last 72 hours. CMP     Component Value Date/Time   NA 141 05/30/2020 0236   NA 137 07/06/2017 1442   K 3.1 (L) 05/30/2020 0236   CL 107 05/30/2020 0236   CO2 26 05/30/2020 0236   GLUCOSE 193 (H) 05/30/2020 0236   BUN 13 05/30/2020 0236   BUN 12 07/06/2017 1442   CREATININE 1.06 (H) 05/30/2020 0236   CREATININE 0.84 02/02/2016 1517   CALCIUM 7.8 (L) 05/30/2020 0236   PROT 5.9 (L) 05/23/2020 1549   ALBUMIN 2.7 (L) 05/23/2020 1549   ALBUMIN 4.4 07/06/2017 1442   AST 14 (L) 05/23/2020 1549   ALT 30 05/23/2020 1549   ALKPHOS 133 (H) 05/23/2020 1549   BILITOT 1.2  05/23/2020 1549   GFRNONAA >60 05/30/2020 0236   GFRNONAA 85 02/02/2016 1517   GFRAA >60 01/20/2019 2216   GFRAA >89 02/02/2016 1517   Lipase     Component Value Date/Time   LIPASE 17 05/23/2020 1549       Studies/Results: No results found.  Anti-infectives: Anti-infectives (From admission, onward)   Start     Dose/Rate Route Frequency Ordered Stop   05/26/20 1400  metroNIDAZOLE (FLAGYL) tablet 500 mg        500 mg Oral Every 8 hours 05/26/20 0853     05/25/20 2200  vancomycin (VANCOCIN) IVPB 1000 mg/200 mL premix        1,000 mg 200 mL/hr over 60 Minutes Intravenous Every 12 hours 05/25/20 0918     05/24/20 1800  vancomycin (VANCOREADY) IVPB 750 mg/150 mL  Status:  Discontinued        750 mg 150 mL/hr over 60 Minutes Intravenous Every 24 hours 05/23/20 1914 05/24/20 0811   05/24/20 1400  ceFEPIme (MAXIPIME) 2 g in sodium chloride 0.9 % 100 mL IVPB        2 g 200 mL/hr over 30 Minutes Intravenous Every 8 hours 05/24/20 0811     05/24/20 0900  vancomycin (VANCOCIN) IVPB 1000 mg/200 mL premix  Status:  Discontinued        1,000 mg 200 mL/hr over 60 Minutes Intravenous Every 12 hours 05/24/20 0811 05/25/20 0918   05/24/20 0800  ceFEPIme (MAXIPIME) 2 g in sodium chloride 0.9 % 100 mL IVPB  Status:  Discontinued        2 g 200 mL/hr over 30 Minutes Intravenous Every 12 hours 05/23/20 1914 05/24/20 0811   05/23/20 2200  clindamycin (CLEOCIN) IVPB 600 mg  Status:  Discontinued        600 mg 100 mL/hr over 30 Minutes Intravenous Every 8 hours 05/23/20 2129 05/26/20 0853   05/23/20 1830  vancomycin (VANCOREADY) IVPB 1500 mg/300 mL        1,500 mg 150 mL/hr over 120 Minutes Intravenous  Once 05/23/20 1813 05/23/20 2229   05/23/20 1800  ceFEPIme (MAXIPIME) 2 g in sodium chloride 0.9 % 100 mL IVPB        2 g 200 mL/hr over 30 Minutes Intravenous  Once 05/23/20 1750 05/23/20 1934   05/23/20 1800  metroNIDAZOLE (FLAGYL) IVPB 500 mg        500 mg 100 mL/hr over 60 Minutes  Intravenous  Once 05/23/20 1750 05/23/20 2027   05/23/20 1800  vancomycin (VANCOCIN) IVPB 1000 mg/200 mL premix  Status:  Discontinued        1,000 mg 200 mL/hr over 60 Minutes Intravenous  Once 05/23/20 1750 05/23/20 1813       Assessment/Plan NSTI of the buttock and retroperitoneal abscess- S/P debridement by Dr. Derrell Lolling 12/20&12/22. Cont BID dressing changeswithDakins.  Hydrotherapy.Monitor closely to ensure does not need to go back to the OR. Needs to mobilize (PT eval). Pulm toilet.  Acute hypoxic ventilator dependent respiratory failure-resolved. Wean o2. Hx DM2 - A1c 8.9. SSI. Persistent hyperglycemia. Diabetic coordinator consult. Changes made per their recs. Hx CVA- on Plavix at home. Was on hold in case she needed further OR, restarted by [facility] 12/26, will stop this again today in case she requires further surgery HLD - crestor Hx of gastric ulcer - Pepcid. Avoid nsaids Mult psych meds - home meds  Obesity BMI 33.79 FEN-CM diet. NS bolus for soft bp. Recheck hgb  ID-cultureswithGardnerellaVaginalis. Report pending. Continue Cefepime/Flagyl/Vanc Foley-Out. Voiding VTE-SCDs,Lovenox Dispo-Med surg. Broke into pus pocket cephalad in wound today, continue dressing changes and hydrotherapy as above. Replete K, check mag and repeat labs in AM.    LOS: 7 days    Franne Forts, [facility] 05/30/2020, 9:44 AM Please see Amion for pager number during day hours 7:00am-4:30pm

## 2020-05-30 NOTE — Progress Notes (Signed)
Inpatient Diabetes Program Recommendations  AACE/ADA: New Consensus Statement on Inpatient Glycemic Control (2015)  Target Ranges:  Prepandial:   less than 140 mg/dL      Peak postprandial:   less than 180 mg/dL (1-2 hours)      Critically ill patients:  140 - 180 mg/dL   Lab Results  Component Value Date   GLUCAP 206 (H) 05/30/2020   HGBA1C 8.9 (H) 05/25/2020    Review of Glycemic Control Results for Kristie Tapia, Kristie Tapia (MRN [id number]) as of 05/30/2020 14:46  Ref. Range 05/29/2020 20:35 05/30/2020 00:04 05/30/2020 03:58 05/30/2020 08:03 05/30/2020 11:18  Glucose-Capillary Latest Ref Range: 70 - 99 mg/dL 115 (H) 726 (H) 203 (H) 161 (H) 206 (H)  Diabetes history: DM2 Outpatient Diabetes medications: Actos 45 units daily Humalog 10-15 units BID Current orders for Inpatient glycemic control: Lantus 18 units daily Novolog moderate tid with meals and HS Inpatient Diabetes Program Recommendations:    May consider increasing Lantus to 22 units daily.   Thanks,  Beryl Meager, RN, BC-ADM Inpatient Diabetes Coordinator Pager [phone] (8a-5p)

## 2020-05-31 ENCOUNTER — Inpatient Hospital Stay (HOSPITAL_COMMUNITY): Payer: Medicare Other

## 2020-05-31 LAB — GLUCOSE, CAPILLARY
Glucose-Capillary: 125 mg/dL — ABNORMAL HIGH (ref 70–99)
Glucose-Capillary: 139 mg/dL — ABNORMAL HIGH (ref 70–99)
Glucose-Capillary: 152 mg/dL — ABNORMAL HIGH (ref 70–99)
Glucose-Capillary: 170 mg/dL — ABNORMAL HIGH (ref 70–99)
Glucose-Capillary: 189 mg/dL — ABNORMAL HIGH (ref 70–99)
Glucose-Capillary: 195 mg/dL — ABNORMAL HIGH (ref 70–99)

## 2020-05-31 LAB — CBC
HCT: 35.4 % — ABNORMAL LOW (ref 36.0–46.0)
Hemoglobin: 11.7 g/dL — ABNORMAL LOW (ref 12.0–15.0)
MCH: 29.5 pg (ref 26.0–34.0)
MCHC: 33.1 g/dL (ref 30.0–36.0)
MCV: 89.2 fL (ref 80.0–100.0)
Platelets: 438 10*3/uL — ABNORMAL HIGH (ref 150–400)
RBC: 3.97 MIL/uL (ref 3.87–5.11)
RDW: 16.1 % — ABNORMAL HIGH (ref 11.5–15.5)
WBC: 22.5 10*3/uL — ABNORMAL HIGH (ref 4.0–10.5)
nRBC: 0.1 % (ref 0.0–0.2)

## 2020-05-31 LAB — BASIC METABOLIC PANEL
Anion gap: 8 (ref 5–15)
BUN: 9 mg/dL (ref 6–20)
CO2: 27 mmol/L (ref 22–32)
Calcium: 7.7 mg/dL — ABNORMAL LOW (ref 8.9–10.3)
Chloride: 104 mmol/L (ref 98–111)
Creatinine, Ser: 0.91 mg/dL (ref 0.44–1.00)
GFR, Estimated: >60 mL/min (ref >60)
Glucose, Bld: 136 mg/dL — ABNORMAL HIGH (ref 70–99)
Potassium: 3.2 mmol/L — ABNORMAL LOW (ref 3.5–5.1)
Sodium: 139 mmol/L (ref 135–145)

## 2020-05-31 LAB — AEROBIC/ANAEROBIC CULTURE W GRAM STAIN (SURGICAL/DEEP WOUND)

## 2020-05-31 MED ORDER — CALCIUM POLYCARBOPHIL 625 MG PO TABS
625.0000 mg | ORAL_TABLET | Freq: Every day | ORAL | Status: DC
  Administered 2020-05-31 – 2020-06-10 (×11): 625 mg via ORAL
  Filled 2020-05-31 (×12): qty 1

## 2020-05-31 MED ORDER — JUVEN PO PACK
1.0000 | PACK | Freq: Two times a day (BID) | ORAL | Status: DC
  Administered 2020-06-01 – 2020-06-07 (×6): 1 via ORAL
  Filled 2020-05-31 (×7): qty 1

## 2020-05-31 MED ORDER — POTASSIUM CHLORIDE CRYS ER 20 MEQ PO TBCR
30.0000 meq | EXTENDED_RELEASE_TABLET | Freq: Two times a day (BID) | ORAL | Status: AC
  Administered 2020-05-31 (×2): 30 meq via ORAL
  Filled 2020-05-31 (×2): qty 1

## 2020-05-31 MED ORDER — SACCHAROMYCES BOULARDII 250 MG PO CAPS
250.0000 mg | ORAL_CAPSULE | Freq: Two times a day (BID) | ORAL | Status: DC
  Administered 2020-05-31 – 2020-06-10 (×21): 250 mg via ORAL
  Filled 2020-05-31 (×21): qty 1

## 2020-05-31 MED ORDER — ENSURE ENLIVE PO LIQD
237.0000 mL | Freq: Every day | ORAL | Status: DC
  Administered 2020-06-01 – 2020-06-06 (×3): 237 mL via ORAL

## 2020-05-31 MED ORDER — INSULIN GLARGINE 100 UNIT/ML ~~LOC~~ SOLN
22.0000 [IU] | Freq: Every day | SUBCUTANEOUS | Status: DC
  Administered 2020-06-01 – 2020-06-10 (×10): 22 [IU] via SUBCUTANEOUS
  Filled 2020-05-31 (×10): qty 0.22

## 2020-05-31 MED ORDER — IOHEXOL 300 MG/ML  SOLN
100.0000 mL | Freq: Once | INTRAMUSCULAR | Status: AC | PRN
  Administered 2020-05-31: 100 mL via INTRAVENOUS

## 2020-05-31 NOTE — Progress Notes (Signed)
Physical Therapy Treatment Patient Details Name: Kristie Tapia MRN: [id number] DOB: 12-Dec-1970 Today's Date: 05/31/2020    History of Present Illness Patient is a 49 year old female, with a history of CVA, diabetes, fibromyalgia, who comes in secondary to right buttock pain and abdominal pain.   Pt s/p I and D x2 of non viable tissues and drainage of abscess.    PT Comments    Patient progressing towards physical therapy goals. Patient ambulated 100' with RW and min guard for safety due to being easily distracted. Educated patient on effects of smoking on healing and encouraged cessation of smoking, patient uninterested in conversation. Patient continues to be limited by decreased activity tolerance, impaired balance, generalized weakness. No PT follow up recommended at this time.    Follow Up Recommendations  No PT follow up     Equipment Recommendations  Rolling [name] with 5" wheels    Recommendations for Other Services       Precautions / Restrictions Precautions Precautions: Fall Restrictions Weight Bearing Restrictions: No    Mobility  Bed Mobility Overal bed mobility: Modified Independent                Transfers Overall transfer level: Needs assistance Equipment used: Rolling [name] (2 wheeled) Transfers: Sit to/from Stand Sit to Stand: Supervision         General transfer comment: cues for safety  Ambulation/Gait Ambulation/Gait assistance: Min guard Gait Distance (Feet): 100 Feet Assistive device: Rolling [name] (2 wheeled) Gait Pattern/deviations: Step-through pattern     General Gait Details: min guard for safety due to easily distracted   Stairs             Wheelchair Mobility    Modified Rankin (Stroke Patients Only)       Balance Overall balance assessment: Needs assistance Sitting-balance support: No upper extremity supported;Feet supported Sitting balance-Leahy Scale: Fair     Standing balance support: Bilateral  upper extremity supported;During functional activity Standing balance-Leahy Scale: Poor Standing balance comment: reliant on UE support                            Cognition Arousal/Alertness: Awake/alert Behavior During Therapy: WFL for tasks assessed/performed Overall Cognitive Status: Impaired/Different from baseline Area of Impairment: Attention;Safety/judgement;Awareness;Problem solving                   Current Attention Level: Sustained     Safety/Judgement: Decreased awareness of safety Awareness: Emergent Problem Solving: Slow processing General Comments: impulsive at times      Exercises      General Comments        Pertinent Vitals/Pain Pain Assessment: 0-10 Pain Score: 4  Pain Location: R buttock and abdominals Pain Descriptors / Indicators: Grimacing Pain Intervention(s): Monitored during session    Home Living                      Prior Function            PT Goals (current goals can now be found in the care plan section) Acute Rehab PT Goals Patient Stated Goal: get this pain and swelling down PT Goal Formulation: With patient Time For Goal Achievement: 06/03/20 Potential to Achieve Goals: Good Progress towards PT goals: Progressing toward goals    Frequency    Min 3X/week      PT Plan Current plan remains appropriate    Co-evaluation  AM-PAC PT "6 Clicks" Mobility   Outcome Measure  Help needed turning from your back to your side while in a flat bed without using bedrails?: None Help needed moving from lying on your back to sitting on the side of a flat bed without using bedrails?: None Help needed moving to and from a bed to a chair (including a wheelchair)?: A Little Help needed standing up from a chair using your arms (e.g., wheelchair or bedside chair)?: A Little Help needed to walk in hospital room?: A Little Help needed climbing 3-5 steps with a railing? : A Little 6 Click Score:  20    End of Session Equipment Utilized During Treatment: Gait belt Activity Tolerance: Patient tolerated treatment well Patient left: in chair;with call bell/phone within reach;with chair alarm set Nurse Communication: Mobility status PT Visit Diagnosis: Other abnormalities of gait and mobility (R26.89);Pain;Difficulty in walking, not elsewhere classified (R26.2) Pain - Right/Left: Right Pain - part of body:  (buttock)     Time: 0998-3382 PT Time Calculation (min) (ACUTE ONLY): 25 min  Charges:  $Therapeutic Activity: 23-37 mins                     Gregor Hams, PT, DPT Acute Rehabilitation Services Pager [phone] Office [phone]    [name] 05/31/2020, 8:58 AM

## 2020-05-31 NOTE — Progress Notes (Signed)
Nutrition Follow-up  DOCUMENTATION CODES:   Not applicable  INTERVENTION:   -1 packet Juven BID, each packet provides 95 calories, 2.5 grams of protein (collagen), and 9.8 grams of carbohydrate (3 grams sugar); also contains 7 grams of L-arginine and L-glutamine, 300 mg vitamin C, 15 mg vitamin E, 1.2 mcg vitamin B-12, 9.5 mg zinc, 200 mg calcium, and 1.5 g  Calcium Beta-hydroxy-Beta-methylbutyrate to support wound healing -Decrease Ensure Enlive po to daily, each supplement provides 350 kcal and 20 grams of protein -Continue MVI with minerals daily -Magic cup TID with meals, each supplement provides 290 kcal and 9 grams of protein  NUTRITION DIAGNOSIS:   Increased nutrient needs related to post-op healing as evidenced by estimated needs.  Ongoing  GOAL:   Patient will meet greater than or equal to 90% of their needs  Progressing  MONITOR:   PO intake,Supplement acceptance  REASON FOR ASSESSMENT:   Rounds    ASSESSMENT:   Pt with PMH of CVA, DM, fibromyalgia and IBS (on lomotil up to QID 12/2019) who is admitted for R buttock pain and abd pain. Per CT pt with necrotizing soft tissue infection to the R buttock, air tracking above the pelvic wall and into the retroperitoneal/perinephric space.  Per MD notes, pt s/p debridement of buttock and retroperitoneal abscess on 05/23/20 and 05/25/20.  12/26- hydrotherapy initiated  Reviewed I/O's: +3 L x 24 hours and +16.4 L since admission  Attempted to speak with pt via call to hospital room phone, however, unable to reach.   Per general surgery notes, pt may require further surgery.   Pt with good appetite, noted meal completion 25-100%. Pt is consuming Ensure Enlive supplements.   Medications reviewed lasix, florastor, and flagyl.   Labs reviewed: CBGS: 125-152 (inpatient orders for glycemic control are 0-15 units insulin aspart TID with meals and bedtime and 22 units insulin glargine daily).   Diet Order:   Diet Order             Diet Carb Modified Fluid consistency: Thin; Room service appropriate? Yes with Assist  Diet effective now                 EDUCATION NEEDS:   Education needs have been addressed  Skin:  Skin Assessment: Skin Integrity Issues: Skin Integrity Issues:: Incisions Incisions: rt buttocks- dehisced  Last BM:  05/31/20  Height:   Ht Readings from Last 1 Encounters:  05/23/20 5\' 5"  (1.651 m)    Weight:   Wt Readings from Last 1 Encounters:  05/24/20 92.1 kg    Ideal Body Weight:  56.8 kg  BMI:  Body mass index is 33.79 kg/m.  Estimated Nutritional Needs:   Kcal:  2200-2400  Protein:  115-130 grams  Fluid:  > 2L/day    05/26/20, RD, LDN, CDCES Registered Dietitian II Certified Diabetes Care and Education Specialist Please refer to [facility] for RD and/or RD on-call/weekend/after hours pager

## 2020-05-31 NOTE — Plan of Care (Signed)

## 2020-05-31 NOTE — Progress Notes (Signed)
6 Days Post-Op  Subjective: CC: Patient notes some sob with moving in bed. No CP. Able to walk 100' with PT today. Having some pain with dressing changes. Tolerating diet. BM yesterday.   Objective: Vital signs in last 24 hours: Temp:  [98.3 F (36.8 C)-99.1 F (37.3 C)] 98.7 F (37.1 C) (12/28 0810) Pulse Rate:  [76-80] 76 (12/28 0810) Resp:  [16-18] 18 (12/28 0810) BP: (116-121)/(49-65) 120/65 (12/28 0810) SpO2:  [94 %-96 %] 96 % (12/28 0810) Last BM Date: 05/30/20  Intake/Output from previous day: 12/27 0701 - 12/28 0700 In: 3047.2 [P.O.:720; IV Piggyback:2327.2] Out: -  Intake/Output this shift: Total I/O In: 240 [P.O.:240] Out: -   PE: Gen: Awake and alert, NAD Lungs: CTA b/l. Normal rate and effort. On RA Heart: RRR Abd: Soft, ND, NT Wound: Right buttock wound measuring 8cm x 6cm x 6cm with 9cm tracking cephalad - some purulence from area of tracking. See picture below. Periwound is clean and without cellulitis, she does have some induration that follows the area of tracking     Lab Results:  Recent Labs    05/30/20 0236 05/31/20 0125  WBC 21.6* 22.5*  HGB 11.9* 11.7*  HCT 33.7* 35.4*  PLT 403* 438*   BMET Recent Labs    05/30/20 0236 05/31/20 0125  NA 141 139  K 3.1* 3.2*  CL 107 104  CO2 26 27  GLUCOSE 193* 136*  BUN 13 9  CREATININE 1.06* 0.91  CALCIUM 7.8* 7.7*   PT/INR No results for input(s): LABPROT, INR in the last 72 hours. CMP     Component Value Date/Time   NA 139 05/31/2020 0125   NA 137 07/06/2017 1442   K 3.2 (L) 05/31/2020 0125   CL 104 05/31/2020 0125   CO2 27 05/31/2020 0125   GLUCOSE 136 (H) 05/31/2020 0125   BUN 9 05/31/2020 0125   BUN 12 07/06/2017 1442   CREATININE 0.91 05/31/2020 0125   CREATININE 0.84 02/02/2016 1517   CALCIUM 7.7 (L) 05/31/2020 0125   PROT 5.9 (L) 05/23/2020 1549   ALBUMIN 2.7 (L) 05/23/2020 1549   ALBUMIN 4.4 07/06/2017 1442   AST 14 (L) 05/23/2020 1549   ALT 30 05/23/2020 1549    ALKPHOS 133 (H) 05/23/2020 1549   BILITOT 1.2 05/23/2020 1549   GFRNONAA >60 05/31/2020 0125   GFRNONAA 85 02/02/2016 1517   GFRAA >60 01/20/2019 2216   GFRAA >89 02/02/2016 1517   Lipase     Component Value Date/Time   LIPASE 17 05/23/2020 1549       Studies/Results: No results found.  Anti-infectives: Anti-infectives (From admission, onward)   Start     Dose/Rate Route Frequency Ordered Stop   05/26/20 1400  metroNIDAZOLE (FLAGYL) tablet 500 mg        500 mg Oral Every 8 hours 05/26/20 0853     05/25/20 2200  vancomycin (VANCOCIN) IVPB 1000 mg/200 mL premix  Status:  Discontinued        1,000 mg 200 mL/hr over 60 Minutes Intravenous Every 12 hours 05/25/20 0918 05/30/20 1129   05/24/20 1800  vancomycin (VANCOREADY) IVPB 750 mg/150 mL  Status:  Discontinued        750 mg 150 mL/hr over 60 Minutes Intravenous Every 24 hours 05/23/20 1914 05/24/20 0811   05/24/20 1400  ceFEPIme (MAXIPIME) 2 g in sodium chloride 0.9 % 100 mL IVPB        2 g 200 mL/hr over 30 Minutes Intravenous Every 8  hours 05/24/20 0811     05/24/20 0900  vancomycin (VANCOCIN) IVPB 1000 mg/200 mL premix  Status:  Discontinued        1,000 mg 200 mL/hr over 60 Minutes Intravenous Every 12 hours 05/24/20 0811 05/25/20 0918   05/24/20 0800  ceFEPIme (MAXIPIME) 2 g in sodium chloride 0.9 % 100 mL IVPB  Status:  Discontinued        2 g 200 mL/hr over 30 Minutes Intravenous Every 12 hours 05/23/20 1914 05/24/20 0811   05/23/20 2200  clindamycin (CLEOCIN) IVPB 600 mg  Status:  Discontinued        600 mg 100 mL/hr over 30 Minutes Intravenous Every 8 hours 05/23/20 2129 05/26/20 0853   05/23/20 1830  vancomycin (VANCOREADY) IVPB 1500 mg/300 mL        1,500 mg 150 mL/hr over 120 Minutes Intravenous  Once 05/23/20 1813 05/23/20 2229   05/23/20 1800  ceFEPIme (MAXIPIME) 2 g in sodium chloride 0.9 % 100 mL IVPB        2 g 200 mL/hr over 30 Minutes Intravenous  Once 05/23/20 1750 05/23/20 1934   05/23/20 1800   metroNIDAZOLE (FLAGYL) IVPB 500 mg        500 mg 100 mL/hr over 60 Minutes Intravenous  Once 05/23/20 1750 05/23/20 2027   05/23/20 1800  vancomycin (VANCOCIN) IVPB 1000 mg/200 mL premix  Status:  Discontinued        1,000 mg 200 mL/hr over 60 Minutes Intravenous  Once 05/23/20 1750 05/23/20 1813       Assessment/Plan NSTI of the buttock and retroperitoneal abscess- S/P debridement by Dr. Derrell Lolling 12/20&12/22. Cont BID dressing changeswithDakins. Hydrotherapy.Monitor closely to ensure does not need to go back to the OR.CT today. Moblize. Pulm toilet. Acute hypoxic ventilator dependent respiratory failure-resolved.On RA. Some sob. No chest pain. EKG and CXR. Cont home lasix. Hx DM2 -A1c 8.9.SSI. Persistent hyperglycemia. Diabetic coordinator consult. Changes made per their recs. Hx CVA- on Plavix at home. Was on hold in case she needed further OR, restarted by [facility] 12/26. Stopped again 12/27.  HLD - crestor Hx of gastric ulcer - Pepcid. Avoid nsaids Mult psych meds - home meds ABL Anemia - hgb stable Obesity BMI 33.79 FEN-CM diet.Replace K ID-cultureswithGardnerellaVaginalis. Report pending. Continue Cefepime/Flagyl/Vanc Foley-Out. Voiding VTE-SCDs,Lovenox Dispo-Med surg. CT today. Continue dressing changes and hydrotherapy as above. Replete K and repeat labs in AM.     LOS: 8 days    Jacinto Halim , [facility] 05/31/2020, 10:00 AM Please see Amion for pager number during day hours 7:00am-4:30pm

## 2020-05-31 NOTE — Progress Notes (Signed)
Physical Therapy Hydrotherapy Treatment    05/31/20 1150  Subjective Assessment  Subjective Agreeable to hydrotherapy - reports she is going for imaging later so surgery can assess whether she needs another I&D.  Patient and Family Stated Goals Avoid another surgery  Date of Onset 05/23/20 (first I&D)  Prior Treatments I&D in OR x 2  Evaluation and Treatment  Evaluation and Treatment Procedures Explained to Patient/Family Yes  Evaluation and Treatment Procedures agreed to  Wound / Incision (Open or Dehisced) 05/24/20 Incision - Dehisced Buttocks Right  Date First Assessed/Time First Assessed: 05/24/20 0000   Wound Type: Incision - Dehisced  Location: Buttocks  Location Orientation: Right  Dressing Type ABD;Barrier Film (skin prep);Gauze (Comment);Moist to dry  Dressing Changed Changed  Dressing Status Clean;Dry;Intact  Dressing Change Frequency Daily  Site / Wound Assessment Yellow;Black;Brown;Dusky;Pink  % Wound base Red or Granulating 20%  % Wound base Yellow/Fibrinous Exudate 60%  % Wound base Black/Eschar 20%  % Wound base Other/Granulation Tissue (Comment) 0%  Peri-wound Assessment Intact;Pink  Margins Unattached edges (unapproximated)  Closure None  Drainage Amount Moderate  Drainage Description Serosanguineous  Treatment Debridement (Selective);Hydrotherapy (Pulse lavage);Packing (Saline gauze)  Hydrotherapy  Pulsed Lavage with Suction (psi) 12 psi  Pulsed Lavage with Suction - Normal Saline Used 1000 mL  Pulsed Lavage Tip Tip with splash shield  Pulsed lavage therapy - wound location rt buttock  Selective Debridement  Selective Debridement - Location rt buttock  Selective Debridement - Tools Used Forceps;Scissors  Selective Debridement - Tissue Removed yellow and black necrotic adipose and unviable tissue  Wound Therapy - Assess/Plan/Recommendations  Wound Therapy - Clinical Statement Wound is very large and difficult to fully visualize to debride. Slow progression of  necrotic adipose debridement. Will continue to follow for selective removal of unviable tissue, to decrease bioburden and promote wound bed healing.  Wound Therapy - Functional Problem List mobility limited due to pain and inadvisable to sit for prolonged periods due to wound location  Factors Delaying/Impairing Wound Healing Infection - systemic/local;Immobility;Diabetes Mellitus  Hydrotherapy Plan Debridement;Dressing change;Patient/family education;Pulsatile lavage with suction  Wound Therapy - Frequency 6X / week  Wound Therapy - Follow Up Recommendations Home health RN  Wound Plan see above  Wound Therapy Goals - Improve the function of patient's integumentary system by progressing the wound(s) through the phases of wound healing by:  Decrease Necrotic Tissue to <40%  Decrease Necrotic Tissue - Progress Progressing toward goal  Increase Granulation Tissue to >40% (allowing 20% for healthy fat)  Increase Granulation Tissue - Progress Progressing toward goal  Decrease Length/Width/Depth by (cm) 0/0/1.0cm  Decrease Length/Width/Depth - Progress Progressing toward goal  Improve Drainage Characteristics Min;Serous  Improve Drainage Characteristics - Progress Progressing toward goal  Patient/Family will be able to  verbalize wound dressing application to instruct future caregivers  Patient/Family Instruction Goal - Progress Progressing toward goal  Goals/treatment plan/discharge plan were made with and agreed upon by patient/family Yes  Time For Goal Achievement 7 days  Wound Therapy - Potential for Goals Fair    Conni Slipper, PT, DPT Acute Rehabilitation Services Pager: [phone] Office: [phone]

## 2020-06-01 DIAGNOSIS — E46 Unspecified protein-calorie malnutrition: Secondary | ICD-10-CM | POA: Diagnosis not present

## 2020-06-01 DIAGNOSIS — E1142 Type 2 diabetes mellitus with diabetic polyneuropathy: Secondary | ICD-10-CM | POA: Diagnosis not present

## 2020-06-01 DIAGNOSIS — M7989 Other specified soft tissue disorders: Secondary | ICD-10-CM | POA: Diagnosis not present

## 2020-06-01 DIAGNOSIS — Z794 Long term (current) use of insulin: Secondary | ICD-10-CM

## 2020-06-01 LAB — BASIC METABOLIC PANEL
Anion gap: 8 (ref 5–15)
BUN: 7 mg/dL (ref 6–20)
CO2: 28 mmol/L (ref 22–32)
Calcium: 7.6 mg/dL — ABNORMAL LOW (ref 8.9–10.3)
Chloride: 102 mmol/L (ref 98–111)
Creatinine, Ser: 0.88 mg/dL (ref 0.44–1.00)
GFR, Estimated: >60 mL/min (ref >60)
Glucose, Bld: 160 mg/dL — ABNORMAL HIGH (ref 70–99)
Potassium: 3.6 mmol/L (ref 3.5–5.1)
Sodium: 138 mmol/L (ref 135–145)

## 2020-06-01 LAB — URINALYSIS, COMPLETE (UACMP) WITH MICROSCOPIC
Bilirubin Urine: NEGATIVE
Glucose, UA: NEGATIVE mg/dL
Ketones, ur: NEGATIVE mg/dL
Leukocytes,Ua: NEGATIVE
Nitrite: NEGATIVE
Protein, ur: NEGATIVE mg/dL
Specific Gravity, Urine: 1.008 (ref 1.005–1.030)
pH: 5 (ref 5.0–8.0)

## 2020-06-01 LAB — CBC
HCT: 34.7 % — ABNORMAL LOW (ref 36.0–46.0)
Hemoglobin: 11.6 g/dL — ABNORMAL LOW (ref 12.0–15.0)
MCH: 29.8 pg (ref 26.0–34.0)
MCHC: 33.4 g/dL (ref 30.0–36.0)
MCV: 89.2 fL (ref 80.0–100.0)
Platelets: 438 10*3/uL — ABNORMAL HIGH (ref 150–400)
RBC: 3.89 MIL/uL (ref 3.87–5.11)
RDW: 15.9 % — ABNORMAL HIGH (ref 11.5–15.5)
WBC: 21.8 10*3/uL — ABNORMAL HIGH (ref 4.0–10.5)
nRBC: 0 % (ref 0.0–0.2)

## 2020-06-01 LAB — GLUCOSE, CAPILLARY
Glucose-Capillary: 142 mg/dL — ABNORMAL HIGH (ref 70–99)
Glucose-Capillary: 156 mg/dL — ABNORMAL HIGH (ref 70–99)
Glucose-Capillary: 184 mg/dL — ABNORMAL HIGH (ref 70–99)
Glucose-Capillary: 185 mg/dL — ABNORMAL HIGH (ref 70–99)
Glucose-Capillary: 209 mg/dL — ABNORMAL HIGH (ref 70–99)
Glucose-Capillary: 213 mg/dL — ABNORMAL HIGH (ref 70–99)

## 2020-06-01 LAB — BRAIN NATRIURETIC PEPTIDE: B Natriuretic Peptide: 315.2 pg/mL — ABNORMAL HIGH (ref 0.0–100.0)

## 2020-06-01 LAB — PREALBUMIN: Prealbumin: 10.3 mg/dL — ABNORMAL LOW (ref 18–38)

## 2020-06-01 LAB — TSH: TSH: 5.371 u[IU]/mL — ABNORMAL HIGH (ref 0.350–4.500)

## 2020-06-01 MED ORDER — GLUCERNA SHAKE PO LIQD
237.0000 mL | Freq: Three times a day (TID) | ORAL | Status: DC
  Administered 2020-06-02 – 2020-06-07 (×6): 237 mL via ORAL

## 2020-06-01 MED ORDER — FUROSEMIDE 10 MG/ML IJ SOLN
20.0000 mg | Freq: Once | INTRAMUSCULAR | Status: AC
  Administered 2020-06-01: 20 mg via INTRAVENOUS

## 2020-06-01 MED ORDER — POTASSIUM CHLORIDE CRYS ER 20 MEQ PO TBCR
30.0000 meq | EXTENDED_RELEASE_TABLET | Freq: Once | ORAL | Status: AC
  Administered 2020-06-01: 30 meq via ORAL
  Filled 2020-06-01: qty 1

## 2020-06-01 MED ORDER — FUROSEMIDE 10 MG/ML IJ SOLN
20.0000 mg | Freq: Once | INTRAMUSCULAR | Status: AC
  Administered 2020-06-01: 20 mg via INTRAVENOUS
  Filled 2020-06-01: qty 2

## 2020-06-01 MED ORDER — PIPERACILLIN-TAZOBACTAM 3.375 G IVPB
3.3750 g | Freq: Three times a day (TID) | INTRAVENOUS | Status: DC
  Administered 2020-06-01 – 2020-06-08 (×21): 3.375 g via INTRAVENOUS
  Filled 2020-06-01 (×21): qty 50

## 2020-06-01 NOTE — Plan of Care (Signed)

## 2020-06-01 NOTE — Progress Notes (Signed)
Physical Therapy Wound Treatment Patient Details  Name: Kristie Tapia MRN: [id number] Date of Birth: 1971-03-02  Today's Date: 06/01/2020 Time: 7829-5621 Time Calculation (min): 23 min  Subjective  Subjective: Agreeable to hydrotherapy - reports she is going for imaging later so surgery can assess whether she needs another I&D. Patient and Family Stated Goals: Avoid another surgery Date of Onset: 05/23/20 (first I&D) Prior Treatments: I&D in OR x 2  Pain Score: Premedicated with PO meds. Pt reports occasional pain with treatment but overall tolerated well.  Wound Assessment  Wound / Incision (Open or Dehisced) 05/24/20 Incision - Dehisced Buttocks Right (Active)  Dressing Type ABD;Barrier Film (skin prep);Gauze (Comment);Moist to dry 06/01/20 1106  Dressing Changed Changed 06/01/20 1106  Dressing Status Clean;Dry;Intact 06/01/20 1106  Dressing Change Frequency Daily 06/01/20 1106  Site / Wound Assessment Yellow;Pink;Black;Brown 06/01/20 1106  % Wound base Red or Granulating 20% 06/01/20 1106  % Wound base Yellow/Fibrinous Exudate 65% 06/01/20 1106  % Wound base Black/Eschar 15% 06/01/20 1106  % Wound base Other/Granulation Tissue (Comment) 0% 06/01/20 1106  Peri-wound Assessment Intact 06/01/20 1106  Wound Length (cm) 11.5 cm 05/29/20 0924  Wound Width (cm) 5.8 cm 05/29/20 0924  Wound Depth (cm) 10 cm 05/29/20 0924  Wound Volume (cm^3) 667 cm^3 05/29/20 0924  Wound Surface Area (cm^2) 66.7 cm^2 05/29/20 0924  Tunneling (cm) 8 05/30/20 2245  Margins Unattached edges (unapproximated) 06/01/20 1106  Closure None 06/01/20 1106  Drainage Amount Moderate 06/01/20 1106  Drainage Description Serosanguineous 06/01/20 1106  Treatment Debridement (Selective);Hydrotherapy (Pulse lavage);Packing (Dakin's soaked gauze) 06/01/20 1106   Hydrotherapy Pulsed lavage therapy - wound location: rt buttock Pulsed Lavage with Suction (psi): 12 psi Pulsed Lavage with Suction - Normal Saline Used:  1000 mL Pulsed Lavage Tip: Tip with splash shield Selective Debridement Selective Debridement - Location: rt buttock Selective Debridement - Tools Used: Forceps;Scissors Selective Debridement - Tissue Removed: yellow and black necrotic adipose and unviable tissue   Wound Assessment and Plan  Wound Therapy - Assess/Plan/Recommendations Wound Therapy - Clinical Statement: Wound is very large and difficult to fully visualize to debride. Slow progression of necrotic adipose debridement. Will continue to follow for selective removal of unviable tissue, to decrease bioburden and promote wound bed healing. Wound Therapy - Functional Problem List: mobility limited due to pain and inadvisable to sit for prolonged periods due to wound location Factors Delaying/Impairing Wound Healing: Infection - systemic/local;Immobility;Diabetes Mellitus Hydrotherapy Plan: Debridement;Dressing change;Patient/family education;Pulsatile lavage with suction Wound Therapy - Frequency: 6X / week Wound Therapy - Follow Up Recommendations: Home health RN Wound Plan: see above  Wound Therapy Goals- Improve the function of patient's integumentary system by progressing the wound(s) through the phases of wound healing (inflammation - proliferation - remodeling) by: Decrease Necrotic Tissue to: <40% Decrease Necrotic Tissue - Progress: Progressing toward goal Increase Granulation Tissue to: >40% (allowing 20% for healthy fat) Increase Granulation Tissue - Progress: Progressing toward goal Decrease Length/Width/Depth by (cm): 0/0/1.0cm Decrease Length/Width/Depth - Progress: Progressing toward goal Improve Drainage Characteristics: Min;Serous Improve Drainage Characteristics - Progress: Progressing toward goal Patient/Family will be able to : verbalize wound dressing application to instruct future caregivers Patient/Family Instruction Goal - Progress: Progressing toward goal Goals/treatment plan/discharge plan were made with  and agreed upon by patient/family: Yes Time For Goal Achievement: 7 days Wound Therapy - Potential for Goals: Fair  Goals will be updated until maximal potential achieved or discharge criteria met.  Discharge criteria: when goals achieved, discharge from hospital, MD decision/surgical intervention, no progress towards  goals, refusal/missing three consecutive treatments without notification or medical reason.  GP     Thelma Comp 06/01/2020, 11:43 AM   Rolinda Roan, PT, DPT Acute Rehabilitation Services Pager: [phone] Office: [phone]

## 2020-06-01 NOTE — Progress Notes (Addendum)
[facility] PROGRESS NOTE  Kristie Tapia ZOX:[id number] DOB: 29-Jun-1970 DOA: 05/23/2020 PCP: System, Provider Not In  Assessment/Plan: Principal Problem:   Necrotizing soft tissue infection Active Problems:   Dyslipidemia   Diabetes (HCC)   Smoker unmotivated to quit   CVA (cerebral vascular accident) (HCC)   Obesity   Chronic musculoskeletal pain      Necrotizing soft tissue infection of buttock  -Status post I&D on 12/20 and 12/22 with plans for taking back to surgery on 12/30 -WBC still elevated at 21.8 -Cultures positive for Prevotella (+beta lactamase) and gardnerella vaginalis -Initially started on vancomycin, cefepime, and Flagyl . Antibiotics switched to Zosyn per ID on 12/29   Diabetes mellitus type 2 (uncontrolled) -Patient admits to poor adherence to diabetic regimen at home -Hemoglobin A1c 8.9 -Hold Actos -Continue Lantus 22 units daily -Continue CBGs with meals with moderate SSI -Appreciate diabetic education and adjust regimen as   Normocytic anemia: Stable.  Hemoglobin 11.6 g/dL -Continue to monitor   Hypoalbuminemia/moderate protein calorie malnutrition: Acute.  Albumin was noted to be low at 2.7 on 12/20.  Suspect hypoalbuminemia and recent surgical procedures likely leading to third spacing as cause for swelling. -Add on prealbumin 10.3 -Glucerna in between meals -Continue using Lasix as needed to help with fluid   Diastolic dysfunction -Last EF noted to be 55 to 60% with grade 1 diastolic dysfunction in 2019 -Patient with 2+ pitting edema in the lower extremies generalized -Strict intake and output -Daily weights -Check BNP 329 -Give additional 20 mg of Lasix IV to make a total of 40 mg for the day -Reassess in a.m. and determine if patient needs continued IV diuresis   History of CVA -Continue to hold Plavix for now and resume when medically appropriate   Depression and anxiety -Continue current regimen   Early onset  dementia -Continue Aricept   GERD -Continue Protonix twice daily    DVT prophylaxsis: Lovenox  Code Status::    Code Status Orders  (From admission, onward)         Start     Ordered   05/24/20 0029  Full code  Continuous        05/24/20 0028        Code Status History    Date Active Date Inactive Code Status Order ID Comments User Context   12/28/2017 0122 12/29/2017 2101 Full Code [id number]  Kathrynn RunningWouk, Noah Bedford, MD Inpatient   01/03/2012 1742 01/04/2012 1454 Full Code [id number]  Montez Hagemanalwitan, Zenaida P, RN Inpatient   Advance Care Planning Activity     Family Communication: family updated about patient's clinical progress Disposition Plan:  As above    Brief narrative: Kristie ReeveLora Ainley is an 49 y.o. female with pmh uncontrolled diabetes mellitus type 2, CVA, OSA, and chronic pain who was admitted on 12/20 for necrotizing fasciitis of the R buttock.  She is awake and alert.  She reported that she started with a boil on her buttock and tried to pop it.  It spread and she tried to lance it without success.  She as having abdominal pain as well and went to urgent care and eventually had a CT showing severe infection and so came in and had debridement.   Consultants:  ID  Procedures:  s/p I&D on 12/20, 12/22  Antibiotics: Anti-infectives (From admission, onward)   Start     Dose/Rate Route Frequency Ordered Stop   06/01/20 1400  piperacillin-tazobactam (ZOSYN) IVPB 3.375 g  3.375 g 12.5 mL/hr over 240 Minutes Intravenous Every 8 hours 06/01/20 0945     05/26/20 1400  metroNIDAZOLE (FLAGYL) tablet 500 mg  Status:  Discontinued        500 mg Oral Every 8 hours 05/26/20 0853 06/01/20 0939   05/25/20 2200  vancomycin (VANCOCIN) IVPB 1000 mg/200 mL premix  Status:  Discontinued        1,000 mg 200 mL/hr over 60 Minutes Intravenous Every 12 hours 05/25/20 0918 05/30/20 1129   05/24/20 1800  vancomycin (VANCOREADY) IVPB 750 mg/150 mL  Status:  Discontinued        750 mg 150  mL/hr over 60 Minutes Intravenous Every 24 hours 05/23/20 1914 05/24/20 0811   05/24/20 1400  ceFEPIme (MAXIPIME) 2 g in sodium chloride 0.9 % 100 mL IVPB  Status:  Discontinued        2 g 200 mL/hr over 30 Minutes Intravenous Every 8 hours 05/24/20 0811 06/01/20 0939   05/24/20 0900  vancomycin (VANCOCIN) IVPB 1000 mg/200 mL premix  Status:  Discontinued        1,000 mg 200 mL/hr over 60 Minutes Intravenous Every 12 hours 05/24/20 0811 05/25/20 0918   05/24/20 0800  ceFEPIme (MAXIPIME) 2 g in sodium chloride 0.9 % 100 mL IVPB  Status:  Discontinued        2 g 200 mL/hr over 30 Minutes Intravenous Every 12 hours 05/23/20 1914 05/24/20 0811   05/23/20 2200  clindamycin (CLEOCIN) IVPB 600 mg  Status:  Discontinued        600 mg 100 mL/hr over 30 Minutes Intravenous Every 8 hours 05/23/20 2129 05/26/20 0853   05/23/20 1830  vancomycin (VANCOREADY) IVPB 1500 mg/300 mL        1,500 mg 150 mL/hr over 120 Minutes Intravenous  Once 05/23/20 1813 05/23/20 2229   05/23/20 1800  ceFEPIme (MAXIPIME) 2 g in sodium chloride 0.9 % 100 mL IVPB        2 g 200 mL/hr over 30 Minutes Intravenous  Once 05/23/20 1750 05/23/20 1934   05/23/20 1800  metroNIDAZOLE (FLAGYL) IVPB 500 mg        500 mg 100 mL/hr over 60 Minutes Intravenous  Once 05/23/20 1750 05/23/20 2027   05/23/20 1800  vancomycin (VANCOCIN) IVPB 1000 mg/200 mL premix  Status:  Discontinued        1,000 mg 200 mL/hr over 60 Minutes Intravenous  Once 05/23/20 1750 05/23/20 1813         HPI/Subjective: Patient reports that she still has nausea but has not vomited recently.  She reports that she is having swelling of her legs stomach and butt,  but it has slowly been improving.  Objective: Vitals:   05/31/20 1433 05/31/20 1949 06/01/20 0544 06/01/20 0736  BP: 122/68 (!) 125/59 (!) 131/57 129/69  Pulse: 92 68 76 80  Resp: 18   18  Temp: 98.5 F (36.9 C) 99 F (37.2 C) 99.1 F (37.3 C) 98.7 F (37.1 C)  TempSrc: Oral Oral Oral Oral   SpO2: 98% 96% 91% 97%  Weight:      Height:        Intake/Output Summary (Last 24 hours) at 06/01/2020 1122 Last data filed at 06/01/2020 0900 Gross per 24 hour  Intake 480 ml  Output --  Net 480 ml    Exam:  General: No acute respiratory distress Lungs: Clear to auscultation bilaterally without wheezes or crackles Cardiovascular: Regular rate and rhythm without murmur gallop or rub normal  S1 and S2 Abdomen: Generalized tenderness palpation.  Bowel sounds positive, no rebound, no ascites, no appreciable mass Extremities: At least +2 pitting lower extremity edema appreciated.     Data Review   Micro Results Recent Results (from the past 240 hour(s))  Blood Culture (routine x 2)     Status: None   Collection Time: 05/23/20  6:28 PM   Specimen: BLOOD LEFT ARM  Result Value Ref Range Status   Specimen Description BLOOD LEFT ARM  Final   Special Requests   Final    BOTTLES DRAWN AEROBIC AND ANAEROBIC Blood Culture results may not be optimal due to an inadequate volume of blood received in culture bottles   Culture   Final    NO GROWTH 5 DAYS Performed at [facility], [address], [place], [place] [address]    Report Status 05/28/2020 FINAL  Final  Resp Panel by RT-PCR (Flu A&B, Covid) Nasopharyngeal Swab     Status: None   Collection Time: 05/23/20  6:54 PM   Specimen: Nasopharyngeal Swab; Nasopharyngeal(NP) swabs in vial transport medium  Result Value Ref Range Status   SARS Coronavirus 2 by RT PCR NEGATIVE NEGATIVE Final    Comment: (NOTE) SARS-CoV-2 target nucleic acids are NOT DETECTED.  The SARS-CoV-2 RNA is generally detectable in upper respiratory specimens during the acute phase of infection. The lowest concentration of SARS-CoV-2 viral copies this assay can detect is 138 copies/mL. A negative result does not preclude SARS-Cov-2 infection and should not be used as the sole basis for treatment or other patient management decisions. A negative result  may occur with  improper specimen collection/handling, submission of specimen other than nasopharyngeal swab, presence of viral mutation(s) within the areas targeted by this assay, and inadequate number of viral copies(<138 copies/mL). A negative result must be combined with clinical observations, patient history, and epidemiological information. The expected result is Negative.  Fact Sheet for Patients:  BloggerCourse.com  Fact Sheet for Healthcare Providers:  SeriousBroker.it  This test is no t yet approved or cleared by the [place] FDA and  has been authorized for detection and/or diagnosis of SARS-CoV-2 by FDA under an Emergency Use Authorization (EUA). This EUA will remain  in effect (meaning this test can be used) for the duration of the COVID-19 declaration under Section 564(b)(1) of the Act, 21 U.S.C.section 360bbb-3(b)(1), unless the authorization is terminated  or revoked sooner.       Influenza A by PCR NEGATIVE NEGATIVE Final   Influenza B by PCR NEGATIVE NEGATIVE Final    Comment: (NOTE) The Xpert Xpress SARS-CoV-2/FLU/RSV plus assay is intended as an aid in the diagnosis of influenza from Nasopharyngeal swab specimens and should not be used as a sole basis for treatment. Nasal washings and aspirates are unacceptable for Xpert Xpress SARS-CoV-2/FLU/RSV testing.  Fact Sheet for Patients: BloggerCourse.com  Fact Sheet for Healthcare Providers: SeriousBroker.it  This test is not yet approved or cleared by the [place] FDA and has been authorized for detection and/or diagnosis of SARS-CoV-2 by FDA under an Emergency Use Authorization (EUA). This EUA will remain in effect (meaning this test can be used) for the duration of the COVID-19 declaration under Section 564(b)(1) of the Act, 21 U.S.C. section 360bbb-3(b)(1), unless the authorization is terminated  or revoked.  Performed at [facility], [address], [place], [place] [address]   Blood Culture (routine x 2)     Status: None   Collection Time: 05/23/20  6:58 PM  Specimen: BLOOD LEFT HAND  Result Value Ref Range Status   Specimen Description BLOOD LEFT HAND  Final   Special Requests   Final    BOTTLES DRAWN AEROBIC AND ANAEROBIC Blood Culture results may not be optimal due to an inadequate volume of blood received in culture bottles   Culture   Final    NO GROWTH 5 DAYS Performed at [facility], [address] [place], [place] [address]    Report Status 05/28/2020 FINAL  Final  Urine culture     Status: None   Collection Time: 05/23/20  8:34 PM   Specimen: Urine, Clean Catch  Result Value Ref Range Status   Specimen Description URINE, CLEAN CATCH  Final   Special Requests NONE  Final   Culture   Final    NO GROWTH Performed at [facility], [address], [place], [place] [address]    Report Status 05/24/2020 FINAL  Final  Aerobic/Anaerobic Culture (surgical/deep wound)     Status: None   Collection Time: 05/24/20 12:10 AM   Specimen: PATH Other; Tissue  Result Value Ref Range Status   Specimen Description BUTTOCKS  Final   Special Requests NONE  Final   Gram Stain   Final    RARE WBC PRESENT, PREDOMINANTLY PMN FEW GRAM NEGATIVE RODS RARE GRAM POSITIVE COCCI RARE GRAM POSITIVE RODS    Culture   Final    FEW GARDNERELLA VAGINALIS Standardized susceptibility testing for this organism is not available. FEW PREVOTELLA SPECIES BETA LACTAMASE POSITIVE Performed at [facility], [address] [place], [place] [address]    Report Status 05/31/2020 FINAL  Final  Surgical pcr screen     Status: None   Collection Time: 05/24/20  3:32 AM   Specimen: Nasal Mucosa; Nasal Swab  Result Value Ref Range Status   MRSA, PCR NEGATIVE NEGATIVE Final   Staphylococcus aureus NEGATIVE NEGATIVE Final    Comment: (NOTE) The Xpert SA Assay (FDA approved  for NASAL specimens in patients 70 years of age and older), is one component of a comprehensive surveillance program. It is not intended to diagnose infection nor to guide or monitor treatment. Performed at [facility], [address] [place], [place] [address]     Radiology Reports CT ABDOMEN PELVIS W CONTRAST  Result Date: 05/31/2020 CLINICAL DATA:  Abdominal pain and fever. Status post debridement of perianal/buttock abscess. EXAM: CT ABDOMEN AND PELVIS WITH CONTRAST TECHNIQUE: Multidetector CT imaging of the abdomen and pelvis was performed using the standard protocol following bolus administration of intravenous contrast. CONTRAST:  OMNIPAQUE IOHEXOL 300 MG/ML  SOLN COMPARISON:  05/23/2020 FINDINGS: Lower Chest: New small bilateral pleural effusions. Hepatobiliary: No hepatic masses identified. Prior cholecystectomy. No evidence of biliary obstruction. Pancreas:  No mass or inflammatory changes. Spleen: Stable moderate splenomegaly measuring approximately 16 cm in length. Stable small less than 1 cm low-attenuation lesion in the inferior aspect of the spleen dating back to 09/02/2017, consistent with benign etiology. Adrenals/Urinary Tract: No masses identified. No evidence of ureteral calculi or hydronephrosis. Gas noted within the urinary bladder, likely due to recent instrumentation. Stomach/Bowel: No evidence of obstruction, inflammatory process or abnormal fluid collections. Vascular/Lymphatic: Stable shotty sub-cm left paraaortic and bilateral iliac lymph nodes noted. No pathologically enlarged lymph nodes. No abdominal aortic aneurysm. Reproductive: Prior hysterectomy noted. Adnexal regions are unremarkable in appearance. Other: Gas is seen within the surgical bed in the right perianal soft tissues, without evidence of residual abscess.  A small amount of retroperitoneal gas is again seen surrounding the right psoas muscle, but is decreased since previous study. Soft tissue  stranding is seen along the right pelvic sidewall, without evidence of abscess. New mild perihepatic ascites, and diffuse mesenteric and body wall edema noted. Musculoskeletal:  No suspicious bone lesions identified. IMPRESSION: Gas in surgical bed in right perianal soft tissues, without residual abscess. Decreased small amount of retroperitoneal gas surrounding the right psoas muscle. No evidence of abscess. New small bilateral pleural effusions, diffuse mesenteric and body wall edema, and mild perihepatic ascites, consistent with 3rd spacing. Stable splenomegaly. Electronically Signed   By: Danae Orleans M.D.   On: 05/31/2020 17:08   CT ABDOMEN PELVIS W CONTRAST  Result Date: 05/23/2020 CLINICAL DATA:  Abdominal pain, fever, nausea, right buttock cellulitis EXAM: CT ABDOMEN AND PELVIS WITH CONTRAST TECHNIQUE: Multidetector CT imaging of the abdomen and pelvis was performed using the standard protocol following bolus administration of intravenous contrast. CONTRAST:  39mL OMNIPAQUE IOHEXOL 300 MG/ML  SOLN COMPARISON:  09/02/2017 FINDINGS: Lower chest: No acute pleural or parenchymal lung disease. Hepatobiliary: No focal liver abnormality is seen. Status post cholecystectomy. No biliary dilatation. Pancreas: Unremarkable. No pancreatic ductal dilatation or surrounding inflammatory changes. Spleen: Spleen is enlarged measuring 14.6 cm in craniocaudal length. Stable subcentimeter hypodensity inferior spleen likely small cyst or hemangioma. Adrenals/Urinary Tract: Adrenal glands are unremarkable. Kidneys are normal, without renal calculi, focal lesion, or hydronephrosis. Bladder is unremarkable. Stomach/Bowel: No bowel obstruction or ileus. Minimal diverticulosis of the descending colon without diverticulitis. The appendix is surgically absent. No bowel wall thickening or inflammatory change. Vascular/Lymphatic: Aortic atherosclerosis. Enlarged lymph nodes are seen within the right inguinal region and along the  right pelvic sidewall, largest measuring 14 mm on image 85. These are likely reactive. Reproductive: Status post hysterectomy. No adnexal masses. Other: There is no free intraperitoneal fluid or free gas. Subcutaneous edema and extensive subcutaneous gas is seen within the right buttock consistent with infection. The gas tracks superiorly in the retroperitoneal space along the right ileo psoas muscle and extends into the right perirenal space. I do not see any associated fluid collection or abscess. No abdominal wall hernia. Musculoskeletal: No acute or destructive bony lesions. Reconstructed images demonstrate no additional findings. IMPRESSION: 1. Findings compatible with cellulitis of the right buttock, with extensive subcutaneous gas consistent with gas-forming organism. The gas extends along the right pelvic sidewall in the retroperitoneum along the right iliopsoas muscle to the right perirenal space. No fluid collection or abscess. 2. Splenomegaly. 3. Reactive adenopathy within the right hemipelvis and right inguinal region. These results were called by telephone at the time of interpretation on 05/23/2020 at 8:40 pm to provider [facility] , who verbally acknowledged these results. Electronically Signed   By: Sharlet Salina M.D.   On: 05/23/2020 20:40   DG CHEST PORT 1 VIEW  Result Date: 05/31/2020 CLINICAL DATA:  Shortness of breath. EXAM: PORTABLE CHEST 1 VIEW COMPARISON:  May 23, 2020. FINDINGS: Left greater than right bibasilar airspace opacities. Left basilar opacity is dense with silhouetting of the left hemidiaphragm. No visible pleural effusions or pneumothorax. Stable cardiomediastinal silhouette, within normal limits. No acute osseous abnormality. IMPRESSION: Left greater than right bibasilar airspace opacities, concerning for pneumonia. Electronically Signed   By: Feliberto Harts MD   On: 05/31/2020 10:45   DG Chest Port 1 View  Result Date: 05/23/2020 CLINICAL DATA:  Nausea and  vomiting.  Shortness of breath. EXAM: PORTABLE CHEST 1 VIEW COMPARISON:  December 01, 2017 FINDINGS: The heart size and mediastinal contours are within normal limits. Both lungs are clear. The visualized skeletal structures are unremarkable. IMPRESSION: No active disease. Electronically Signed   By: Katherine Mantle M.D.   On: 05/23/2020 18:12     CBC Recent Labs  Lab 05/28/20 1155 05/29/20 1241 05/30/20 0236 05/31/20 0125 06/01/20 0141  WBC 21.1* 23.3* 21.6* 22.5* 21.8*  HGB 12.5 12.3 11.9* 11.7* 11.6*  HCT 37.4 37.4 33.7* 35.4* 34.7*  PLT 375 454* 403* 438* 438*  MCV 90.1 89.7 88.2 89.2 89.2  MCH 30.1 29.5 31.2 29.5 29.8  MCHC 33.4 32.9 35.3 33.1 33.4  RDW 16.5* 16.3* 16.4* 16.1* 15.9*    Chemistries  Recent Labs  Lab 05/28/20 1155 05/29/20 1241 05/30/20 0236 05/31/20 0125 06/01/20 0141  NA 136 139 141 139 138  K 3.3* 4.0 3.1* 3.2* 3.6  CL 105 105 107 104 102  CO2 24 27 26 27 28   GLUCOSE 211* 195* 193* 136* 160*  BUN 16 15 13 9 7   CREATININE 1.00 1.08* 1.06* 0.91 0.88  CALCIUM 7.7* 7.9* 7.8* 7.7* 7.6*  MG  --   --  1.9  --   --    ------------------------------------------------------------------------------------------------------------------ estimated creatinine clearance is 86.7 mL/min (by C-G formula based on SCr of 0.88 mg/dL). ------------------------------------------------------------------------------------------------------------------ No results for input(s): HGBA1C in the last 72 hours. ------------------------------------------------------------------------------------------------------------------ No results for input(s): CHOL, HDL, LDLCALC, TRIG, CHOLHDL, LDLDIRECT in the last 72 hours. ------------------------------------------------------------------------------------------------------------------ No results for input(s): TSH, T4TOTAL, T3FREE, THYROIDAB in the last 72 hours.  Invalid input(s):  FREET3 ------------------------------------------------------------------------------------------------------------------ No results for input(s): VITAMINB12, FOLATE, FERRITIN, TIBC, IRON, RETICCTPCT in the last 72 hours.  Coagulation profile No results for input(s): INR, PROTIME in the last 168 hours.  No results for input(s): DDIMER in the last 72 hours.  Cardiac Enzymes No results for input(s): CKMB, TROPONINI, MYOGLOBIN in the last 168 hours.  Invalid input(s): CK ------------------------------------------------------------------------------------------------------------------ Invalid input(s): POCBNP   CBG: Recent Labs  Lab 05/31/20 2000 05/31/20 2338 06/01/20 0321 06/01/20 0818 06/01/20 1109  GLUCAP 170* 189* 185* 156* 184*       Studies: CT ABDOMEN PELVIS W CONTRAST  Result Date: 05/31/2020 CLINICAL DATA:  Abdominal pain and fever. Status post debridement of perianal/buttock abscess. EXAM: CT ABDOMEN AND PELVIS WITH CONTRAST TECHNIQUE: Multidetector CT imaging of the abdomen and pelvis was performed using the standard protocol following bolus administration of intravenous contrast. CONTRAST:  06/03/20 OMNIPAQUE IOHEXOL 300 MG/ML  SOLN COMPARISON:  05/23/2020 FINDINGS: Lower Chest: New small bilateral pleural effusions. Hepatobiliary: No hepatic masses identified. Prior cholecystectomy. No evidence of biliary obstruction. Pancreas:  No mass or inflammatory changes. Spleen: Stable moderate splenomegaly measuring approximately 16 cm in length. Stable small less than 1 cm low-attenuation lesion in the inferior aspect of the spleen dating back to 09/02/2017, consistent with benign etiology. Adrenals/Urinary Tract: No masses identified. No evidence of ureteral calculi or hydronephrosis. Gas noted within the urinary bladder, likely due to recent instrumentation. Stomach/Bowel: No evidence of obstruction, inflammatory process or abnormal fluid collections. Vascular/Lymphatic: Stable  shotty sub-cm left paraaortic and bilateral iliac lymph nodes noted. No pathologically enlarged lymph nodes. No abdominal aortic aneurysm. Reproductive: Prior hysterectomy noted. Adnexal regions are unremarkable in appearance. Other: Gas is seen within the surgical bed in the right perianal soft tissues, without evidence of residual abscess. A small amount of retroperitoneal gas is again seen surrounding the right psoas muscle, but is decreased since previous study. Soft tissue stranding is seen along the  right pelvic sidewall, without evidence of abscess. New mild perihepatic ascites, and diffuse mesenteric and body wall edema noted. Musculoskeletal:  No suspicious bone lesions identified. IMPRESSION: Gas in surgical bed in right perianal soft tissues, without residual abscess. Decreased small amount of retroperitoneal gas surrounding the right psoas muscle. No evidence of abscess. New small bilateral pleural effusions, diffuse mesenteric and body wall edema, and mild perihepatic ascites, consistent with 3rd spacing. Stable splenomegaly. Electronically Signed   By: Danae Orleans M.D.   On: 05/31/2020 17:08   DG CHEST PORT 1 VIEW  Result Date: 05/31/2020 CLINICAL DATA:  Shortness of breath. EXAM: PORTABLE CHEST 1 VIEW COMPARISON:  May 23, 2020. FINDINGS: Left greater than right bibasilar airspace opacities. Left basilar opacity is dense with silhouetting of the left hemidiaphragm. No visible pleural effusions or pneumothorax. Stable cardiomediastinal silhouette, within normal limits. No acute osseous abnormality. IMPRESSION: Left greater than right bibasilar airspace opacities, concerning for pneumonia. Electronically Signed   By: Feliberto Harts MD   On: 05/31/2020 10:45      Lab Results  Component Value Date   HGBA1C 8.9 (H) 05/25/2020   HGBA1C 8.7 (A) 08/19/2018   HGBA1C 8.5 (A) 06/17/2018   Lab Results  Component Value Date   LDLCALC 18 12/28/2017   CREATININE 0.88 06/01/2020        Scheduled Meds: . Chlorhexidine Gluconate Cloth  6 each Topical Daily  . donepezil  10 mg Oral Daily  . enoxaparin (LOVENOX) injection  40 mg Subcutaneous Q24H  . escitalopram  20 mg Oral QHS  . famotidine  40 mg Oral QHS  . feeding supplement  237 mL Oral QHS  . furosemide  20 mg Oral Daily  . insulin aspart  0-15 Units Subcutaneous TID WC & HS  . insulin glargine  22 Units Subcutaneous Daily  . multivitamin with minerals  1 tablet Oral Daily  . nutrition supplement (JUVEN)  1 packet Oral BID BM  . pantoprazole  40 mg Oral BID  . polycarbophil  625 mg Oral Daily  . pregabalin  100 mg Oral q AM  . pregabalin  200 mg Oral QHS  . rosuvastatin  20 mg Oral QHS  . saccharomyces boulardii  250 mg Oral BID  . traZODone  150 mg Oral QHS   Continuous Infusions: . piperacillin-tazobactam (ZOSYN)  IV      Principal Problem:   Necrotizing soft tissue infection Active Problems:   Dyslipidemia   Diabetes (HCC)   Smoker unmotivated to quit   CVA (cerebral vascular accident) (HCC)   Obesity   Chronic musculoskeletal pain    Time spent: 45 minutes   Velencia Lenart A Baltazar [place]  [facility] If 7PM-7AM, please contact night-coverage at [email or web address] 06/01/2020, 11:22 AM  LOS: 9 days

## 2020-06-01 NOTE — Progress Notes (Signed)
Pharmacy Antibiotic Note  Kristie Tapia is a 49 y.o. female admitted on 05/23/2020 with Necrotizing soft tissue infection of R-buttock - culture growing few gardnerella vaginalis and prevotella sp, beta lactamase+.  Pharmacy has been consultedby ID to transition from cefepime/flagyl to Zosyn dosing. SCr wnl.  Plan: D/c cefepime/Flagy >> Zosyn 3.375g IV q8h (4h infusion) Monitor clinical progress, c/s, renal function F/u de-escalation plan/LOT   Height: 5\' 5"  (165.1 cm) Weight: 92.1 kg (203 lb 0.7 oz) IBW/kg (Calculated) : 57  Temp (24hrs), Avg:98.8 F (37.1 C), Min:98.5 F (36.9 C), Max:99.1 F (37.3 C)  Recent Labs  Lab 05/26/20 0957 05/28/20 1155 05/29/20 1241 05/30/20 0236 05/31/20 0125 06/01/20 0141  WBC  --  21.1* 23.3* 21.6* 22.5* 21.8*  CREATININE 0.99 1.00 1.08* 1.06* 0.91 0.88  VANCOTROUGH 17  --   --   --   --   --     Estimated Creatinine Clearance: 86.7 mL/min (by C-G formula based on SCr of 0.88 mg/dL).    Allergies  Allergen Reactions  . Sulfa Antibiotics Other (See Comments)    Burns the inside of my mouth; "blisters; leaves tongue and inside of mouth solid red"  . Tape Other (See Comments)    Blisters- USE PAPER TAPE ONLY  . Decongestant [Pseudoephedrine Hcl Er] Itching    ALL DECONGESTANTS  . Duloxetine Other (See Comments)    Felt "out of it"  . Fremanezumab-Vfrm Other (See Comments) and Nausea Only  . Tizanidine Other (See Comments)    headache  . Chlorpheniramine Itching  . Red Dye Other (See Comments)    CAUSES MIGRAINES  . Eggs Or Egg-Derived Products Nausea And Vomiting  . Latex Itching  . Percocet [Oxycodone-Acetaminophen] Other (See Comments)    PT reports having nightmares when taking Percocet  . Prednisone Other (See Comments)    "Gives me a bad attitude"  . Zoloft [Sertraline Hcl] Rash    06/03/20, PharmD, BCPS Please check AMION for all [facility] contact numbers Clinical Pharmacist 06/01/2020 9:46 AM

## 2020-06-01 NOTE — Consult Note (Addendum)
I have seen and examined the patient. I have personally reviewed the clinical findings, laboratory findings, microbiological data and imaging studies. The assessment and treatment plan was discussed with the  Advance Practice Provider, Jeanine LuzGregory Calone  I agree with her/his recommendations except following additions/corrections.  Physical exam Elderly female lying in bed, no acute distress HEENT - WNL Heart and lung sounds WNL Abdomen soft MSK - no joint swelling/tenderness Skin - no rashes Neuro- calm, cooperative and grossly non focal           Will continue Pip/tazo for now pending further surgical debridements  Will continue to follow cultures for adjusting antibiotics.   Urine cx growing >100,000 E faecium however UA is clean. Complaining of burning and suprapubic pain.  I examined her yesterday but she was off the floor today. Per RN and her mother, patient was complaining of burning and suprapubic pain   Will start her on Linezolid for cystitis and reassess tomorrow.  She is on citalopram which is low dose, low risk for Serotonin syndrome given anticipated short course of treatment with linezolid Following  Odette FractionSabina Yavuz Kirby, MD [facility]      [facility]    Date of Admission:  05/23/2020     Total days of antibiotics 9  Zosyn 12/29 >> current  Cefepime 12/20 >> 12/29   Vancomycin 12/20 >> 12/27  Clindamycin 12/20 >> 12/22            Reason for Consult: necrotizing soft tissue wound     Referring Provider: CCS team  Primary Care Provider: System, Provider Not In    Assessment: Sharia ReeveLora Abend is a 49 y.o. female here with a large necrotizing polymicrobial peri-rectal wound. Initial gram stain with GPCs, GNRs and GPRs with growth of Prevotella (+beta lactamase) and gardnerella vaginalis.  Currently on therapy with cefepime and metronidazole. I worry since stopping  enterococcus coverage that may be a contributor to stalled/backward progress of wound and new drainage vs ongoing need for debridement.  Will switch antimicrobials to zosyn for now to cover common necrotizing pathogens and the recovered ones as well. Gardnerella likely susceptible to penicillins based on lit review and d/w ID pharmacy team. Although it was burdensome enough to grow out I don't suspect this organism to cause the necrotizing features her wound demonstrates. She is having a hard time with nausea that is likely r/t flagyl. Hopefully transitioning to zosyn for now will help this for her.   No ongoing signs of sepsis, however since 12/26 from review of photos and wound measurements the tracking is a few cm deeper and purulence now noted in the wound bed. With deep necrotizing infection surgical control is essential for treatment and recovery; appreciate close follow up care from surgery team - I suspect that she may need another return trip to OR based on persistent leukocytosis and now deeper wound with purulence noted the last 1-2 checks. Fevers have ceased. CT yesterday does not reveal an ongoing abscess.   Length of therapy to be determined based on further surgery needs.    Plan: 1. Antimicrobials: zosyn  2. Aggressive surgery approach recommended  3. Follow for duration of therapy based on #2 needs       Principal Problem:   Necrotizing soft tissue infection Active Problems:   Dyslipidemia   Diabetes (HCC)   Smoker unmotivated to quit   CVA (cerebral vascular accident) (HCC)   Obesity  Chronic musculoskeletal pain   . Chlorhexidine Gluconate Cloth  6 each Topical Daily  . donepezil  10 mg Oral Daily  . enoxaparin (LOVENOX) injection  40 mg Subcutaneous Q24H  . escitalopram  20 mg Oral QHS  . famotidine  40 mg Oral QHS  . feeding supplement  237 mL Oral QHS  . furosemide  20 mg Oral Daily  . insulin aspart  0-15 Units Subcutaneous TID WC & HS  . insulin glargine  22  Units Subcutaneous Daily  . metroNIDAZOLE  500 mg Oral Q8H  . multivitamin with minerals  1 tablet Oral Daily  . nutrition supplement (JUVEN)  1 packet Oral BID BM  . pantoprazole  40 mg Oral BID  . polycarbophil  625 mg Oral Daily  . pregabalin  100 mg Oral q AM  . pregabalin  200 mg Oral QHS  . rosuvastatin  20 mg Oral QHS  . saccharomyces boulardii  250 mg Oral BID  . sodium hypochlorite   Irrigation BID  . traZODone  150 mg Oral QHS    HPI: Maryssa Giampietro is a 49 y.o. female admitted from home with buttock/groin and abdominal pain. She was found to have a large necrotizing soft tissue infection involving the right buttock with air tracking up above the pelvic wall into the retroperitoneal/perinephric space. Presented with lactic acid of 2, leukocytosis and fever to 103. She was started on broad spectrum antibiotics with vancomycin and zosyn and taken to OR for debridement on 12/20 and repeat debridement on 12/22. Intraoperative cultures with polymicrobial gram stain (GPCs, GNRs, GPRs) with few prevotella species (+beta lactamase) and gardnerella vaginalis.   Since her last debridement on 12/22 she has continued to undergo hydrotherapy. On subsequent wound checks she still has some purulence in the wound bed and induration of the periwound. Repeat CT can yesterday reveals improved but ongoing gas tracking. She has had some pain with trying to walk and with ongoing therapy sessions. Pretty frequent and consistent nausea requiring alt zofran/phenergan treatments.    Review of Systems: ROS  Past Medical History:  Diagnosis Date  . Anemia   . Anxiety   . Complication of anesthesia ` 1998   "lungs collapsed during LEEP procedure"  . Gallstones   . GERD (gastroesophageal reflux disease)   . H/O hiatal hernia   . History of stomach ulcers   . Migraines   . Sleep apnea   . Stroke (HCC)   . Type II diabetes mellitus (HCC)     Social History   Tobacco Use  . Smoking status: Current  Every Day Smoker    Packs/day: 1.00    Years: 29.00    Pack years: 29.00    Types: Cigarettes    Last attempt to quit: 03/05/2017    Years since quitting: 3.2  . Smokeless tobacco: Never Used  . Tobacco comment: has smoked since age 67  Vaping Use  . Vaping Use: Never used  Substance Use Topics  . Alcohol use: No    Comment: hasn't  had s drink in over 5 yrs  . Drug use: No    Family History  Problem Relation Age of Onset  . Breast cancer Other   . Diabetes Paternal Grandfather    Allergies  Allergen Reactions  . Sulfa Antibiotics Other (See Comments)    Burns the inside of my mouth; "blisters; leaves tongue and inside of mouth solid red"  . Tape Other (See Comments)    Blisters- USE PAPER TAPE  ONLY  . Decongestant [Pseudoephedrine Hcl Er] Itching    ALL DECONGESTANTS  . Duloxetine Other (See Comments)    Felt "out of it"  . Fremanezumab-Vfrm Other (See Comments) and Nausea Only  . Tizanidine Other (See Comments)    headache  . Chlorpheniramine Itching  . Red Dye Other (See Comments)    CAUSES MIGRAINES  . Eggs Or Egg-Derived Products Nausea And Vomiting  . Latex Itching  . Percocet [Oxycodone-Acetaminophen] Other (See Comments)    PT reports having nightmares when taking Percocet  . Prednisone Other (See Comments)    "Gives me a bad attitude"  . Zoloft [Sertraline Hcl] Rash    OBJECTIVE: Blood pressure 129/69, pulse 80, temperature 98.7 F (37.1 C), temperature source Oral, resp. rate 18, height 5\' 5"  (1.651 m), weight 92.1 kg, SpO2 97 %.  Physical Exam  Lab Results Lab Results  Component Value Date   WBC 21.8 (H) 06/01/2020   HGB 11.6 (L) 06/01/2020   HCT 34.7 (L) 06/01/2020   MCV 89.2 06/01/2020   PLT 438 (H) 06/01/2020    Lab Results  Component Value Date   CREATININE 0.88 06/01/2020   BUN 7 06/01/2020   NA 138 06/01/2020   K 3.6 06/01/2020   CL 102 06/01/2020   CO2 28 06/01/2020    Lab Results  Component Value Date   ALT 30 05/23/2020    AST 14 (L) 05/23/2020   ALKPHOS 133 (H) 05/23/2020   BILITOT 1.2 05/23/2020     Microbiology: Recent Results (from the past 240 hour(s))  Blood Culture (routine x 2)     Status: None   Collection Time: 05/23/20  6:28 PM   Specimen: BLOOD LEFT ARM  Result Value Ref Range Status   Specimen Description BLOOD LEFT ARM  Final   Special Requests   Final    BOTTLES DRAWN AEROBIC AND ANAEROBIC Blood Culture results may not be optimal due to an inadequate volume of blood received in culture bottles   Culture   Final    NO GROWTH 5 DAYS Performed at [facility], [address], [place], [place]    Report Status 05/28/2020 FINAL  Final  Resp Panel by RT-PCR (Flu A&B, Covid) Nasopharyngeal Swab     Status: None   Collection Time: 05/23/20  6:54 PM   Specimen: Nasopharyngeal Swab; Nasopharyngeal(NP) swabs in vial transport medium  Result Value Ref Range Status   SARS Coronavirus 2 by RT PCR NEGATIVE NEGATIVE Final    Comment: (NOTE) SARS-CoV-2 target nucleic acids are NOT DETECTED.  The SARS-CoV-2 RNA is generally detectable in upper respiratory specimens during the acute phase of infection. The lowest concentration of SARS-CoV-2 viral copies this assay can detect is 138 copies/mL. A negative result does not preclude SARS-Cov-2 infection and should not be used as the sole basis for treatment or other patient management decisions. A negative result may occur with  improper specimen collection/handling, submission of specimen other than nasopharyngeal swab, presence of viral mutation(s) within the areas targeted by this assay, and inadequate number of viral copies(<138 copies/mL). A negative result must be combined with clinical observations, patient history, and epidemiological information. The expected result is Negative.  Fact Sheet for Patients:  05/25/20  Fact Sheet for Healthcare Providers:   BloggerCourse.com  This test is no t yet approved or cleared by the [employer] FDA and  has been authorized for detection and/or diagnosis of SARS-CoV-2 by FDA under an Emergency Use Authorization (EUA). This EUA  will remain  in effect (meaning this test can be used) for the duration of the COVID-19 declaration under Section 564(b)(1) of the Act, 21 U.S.C.section 360bbb-3(b)(1), unless the authorization is terminated  or revoked sooner.       Influenza A by PCR NEGATIVE NEGATIVE Final   Influenza B by PCR NEGATIVE NEGATIVE Final    Comment: (NOTE) The Xpert Xpress SARS-CoV-2/FLU/RSV plus assay is intended as an aid in the diagnosis of influenza from Nasopharyngeal swab specimens and should not be used as a sole basis for treatment. Nasal washings and aspirates are unacceptable for Xpert Xpress SARS-CoV-2/FLU/RSV testing.  Fact Sheet for Patients: BloggerCourse.com  Fact Sheet for Healthcare Providers: SeriousBroker.it  This test is not yet approved or cleared by the [place] FDA and has been authorized for detection and/or diagnosis of SARS-CoV-2 by FDA under an Emergency Use Authorization (EUA). This EUA will remain in effect (meaning this test can be used) for the duration of the COVID-19 declaration under Section 564(b)(1) of the Act, 21 U.S.C. section 360bbb-3(b)(1), unless the authorization is terminated or revoked.  Performed at [facility], [address] [place], [place] [address]   Blood Culture (routine x 2)     Status: None   Collection Time: 05/23/20  6:58 PM   Specimen: BLOOD LEFT HAND  Result Value Ref Range Status   Specimen Description BLOOD LEFT HAND  Final   Special Requests   Final    BOTTLES DRAWN AEROBIC AND ANAEROBIC Blood Culture results may not be optimal due to an inadequate volume of blood received in culture bottles   Culture   Final    NO GROWTH 5  DAYS Performed at [facility], [address] [place], [place] [address]    Report Status 05/28/2020 FINAL  Final  Urine culture     Status: None   Collection Time: 05/23/20  8:34 PM   Specimen: Urine, Clean Catch  Result Value Ref Range Status   Specimen Description URINE, CLEAN CATCH  Final   Special Requests NONE  Final   Culture   Final    NO GROWTH Performed at [facility], [address] [place], [place] [address]    Report Status 05/24/2020 FINAL  Final  Aerobic/Anaerobic Culture (surgical/deep wound)     Status: None   Collection Time: 05/24/20 12:10 AM   Specimen: PATH Other; Tissue  Result Value Ref Range Status   Specimen Description BUTTOCKS  Final   Special Requests NONE  Final   Gram Stain   Final    RARE WBC PRESENT, PREDOMINANTLY PMN FEW GRAM NEGATIVE RODS RARE GRAM POSITIVE COCCI RARE GRAM POSITIVE RODS    Culture   Final    FEW GARDNERELLA VAGINALIS Standardized susceptibility testing for this organism is not available. FEW PREVOTELLA SPECIES BETA LACTAMASE POSITIVE Performed at [facility], [address], [place], [place] [address]    Report Status 05/31/2020 FINAL  Final  Surgical pcr screen     Status: None   Collection Time: 05/24/20  3:32 AM   Specimen: Nasal Mucosa; Nasal Swab  Result Value Ref Range Status   MRSA, PCR NEGATIVE NEGATIVE Final   Staphylococcus aureus NEGATIVE NEGATIVE Final    Comment: (NOTE) The Xpert SA Assay (FDA approved for NASAL specimens in patients 64 years of age and older), is one component of a comprehensive surveillance program. It is not intended to diagnose infection nor to guide or monitor treatment. Performed at  [facility], [address] [place], [place], [place] [address]      Rexene Alberts, MSN, NP-C [facility]  [email or web address] Pager: [phone] Office: [phone] [facility] Main Line: [phone]

## 2020-06-01 NOTE — Progress Notes (Addendum)
7 Days Post-Op  Subjective: CC: Patient reports that she has been having some suprapubic abdominal discomfort and urinary frequency, similar to when she has a UTI in the past. Still having occasional nausea. No emesis. Denies flank pain. Tolerating diet, only finishing ~50% trays. Having loose bm's, ~3x/day. She reports pain with dressing change that is controlled with medications. SOB has improved. No CP. On RA.   Objective: Vital signs in last 24 hours: Temp:  [98.5 F (36.9 C)-99.1 F (37.3 C)] 98.7 F (37.1 C) (12/29 0736) Pulse Rate:  [68-92] 80 (12/29 0736) Resp:  [18] 18 (12/29 0736) BP: (122-131)/(57-69) 129/69 (12/29 0736) SpO2:  [91 %-98 %] 97 % (12/29 0736) Last BM Date: 05/31/20  Intake/Output from previous day: 12/28 0701 - 12/29 0700 In: 480 [P.O.:480] Out: -  Intake/Output this shift: No intake/output data recorded.  PE: Gen: Awake and alert, NAD Lungs: CTA b/l. Normal rate and effort. On RA Heart: RRR Abd: Soft, ND, NT, +BS Wound: Right buttock wound measuring 8cm x 6cm x 6cm with9cm tracking cephaladand 9cm towards right hip. There is some scant purulence on dressing. No gross discharge after dressing removed. Periwound with some induration that follows the area of tracking. No heat or erythema.  Msk: Trace LE edema b/l. Calves NT and equal in size b/l     Lab Results:  Recent Labs    05/31/20 0125 06/01/20 0141  WBC 22.5* 21.8*  HGB 11.7* 11.6*  HCT 35.4* 34.7*  PLT 438* 438*   BMET Recent Labs    05/31/20 0125 06/01/20 0141  NA 139 138  K 3.2* 3.6  CL 104 102  CO2 27 28  GLUCOSE 136* 160*  BUN 9 7  CREATININE 0.91 0.88  CALCIUM 7.7* 7.6*   PT/INR No results for input(s): LABPROT, INR in the last 72 hours. CMP     Component Value Date/Time   NA 138 06/01/2020 0141   NA 137 07/06/2017 1442   K 3.6 06/01/2020 0141   CL 102 06/01/2020 0141   CO2 28 06/01/2020 0141   GLUCOSE 160 (H) 06/01/2020 0141   BUN 7 06/01/2020 0141    BUN 12 07/06/2017 1442   CREATININE 0.88 06/01/2020 0141   CREATININE 0.84 02/02/2016 1517   CALCIUM 7.6 (L) 06/01/2020 0141   PROT 5.9 (L) 05/23/2020 1549   ALBUMIN 2.7 (L) 05/23/2020 1549   ALBUMIN 4.4 07/06/2017 1442   AST 14 (L) 05/23/2020 1549   ALT 30 05/23/2020 1549   ALKPHOS 133 (H) 05/23/2020 1549   BILITOT 1.2 05/23/2020 1549   GFRNONAA >60 06/01/2020 0141   GFRNONAA 85 02/02/2016 1517   GFRAA >60 01/20/2019 2216   GFRAA >89 02/02/2016 1517   Lipase     Component Value Date/Time   LIPASE 17 05/23/2020 1549       Studies/Results: CT ABDOMEN PELVIS W CONTRAST  Result Date: 05/31/2020 CLINICAL DATA:  Abdominal pain and fever. Status post debridement of perianal/buttock abscess. EXAM: CT ABDOMEN AND PELVIS WITH CONTRAST TECHNIQUE: Multidetector CT imaging of the abdomen and pelvis was performed using the standard protocol following bolus administration of intravenous contrast. CONTRAST:  OMNIPAQUE IOHEXOL 300 MG/ML  SOLN COMPARISON:  05/23/2020 FINDINGS: Lower Chest: New small bilateral pleural effusions. Hepatobiliary: No hepatic masses identified. Prior cholecystectomy. No evidence of biliary obstruction. Pancreas:  No mass or inflammatory changes. Spleen: Stable moderate splenomegaly measuring approximately 16 cm in length. Stable small less than 1 cm low-attenuation lesion in the inferior aspect of the  spleen dating back to 09/02/2017, consistent with benign etiology. Adrenals/Urinary Tract: No masses identified. No evidence of ureteral calculi or hydronephrosis. Gas noted within the urinary bladder, likely due to recent instrumentation. Stomach/Bowel: No evidence of obstruction, inflammatory process or abnormal fluid collections. Vascular/Lymphatic: Stable shotty sub-cm left paraaortic and bilateral iliac lymph nodes noted. No pathologically enlarged lymph nodes. No abdominal aortic aneurysm. Reproductive: Prior hysterectomy noted. Adnexal regions are unremarkable in  appearance. Other: Gas is seen within the surgical bed in the right perianal soft tissues, without evidence of residual abscess. A small amount of retroperitoneal gas is again seen surrounding the right psoas muscle, but is decreased since previous study. Soft tissue stranding is seen along the right pelvic sidewall, without evidence of abscess. New mild perihepatic ascites, and diffuse mesenteric and body wall edema noted. Musculoskeletal:  No suspicious bone lesions identified. IMPRESSION: Gas in surgical bed in right perianal soft tissues, without residual abscess. Decreased small amount of retroperitoneal gas surrounding the right psoas muscle. No evidence of abscess. New small bilateral pleural effusions, diffuse mesenteric and body wall edema, and mild perihepatic ascites, consistent with 3rd spacing. Stable splenomegaly. Electronically Signed   By: Danae Orleans M.D.   On: 05/31/2020 17:08   DG CHEST PORT 1 VIEW  Result Date: 05/31/2020 CLINICAL DATA:  Shortness of breath. EXAM: PORTABLE CHEST 1 VIEW COMPARISON:  May 23, 2020. FINDINGS: Left greater than right bibasilar airspace opacities. Left basilar opacity is dense with silhouetting of the left hemidiaphragm. No visible pleural effusions or pneumothorax. Stable cardiomediastinal silhouette, within normal limits. No acute osseous abnormality. IMPRESSION: Left greater than right bibasilar airspace opacities, concerning for pneumonia. Electronically Signed   By: Feliberto Harts MD   On: 05/31/2020 10:45    Anti-infectives: Anti-infectives (From admission, onward)   Start     Dose/Rate Route Frequency Ordered Stop   05/26/20 1400  metroNIDAZOLE (FLAGYL) tablet 500 mg        500 mg Oral Every 8 hours 05/26/20 0853     05/25/20 2200  vancomycin (VANCOCIN) IVPB 1000 mg/200 mL premix  Status:  Discontinued        1,000 mg 200 mL/hr over 60 Minutes Intravenous Every 12 hours 05/25/20 0918 05/30/20 1129   05/24/20 1800  vancomycin (VANCOREADY)  IVPB 750 mg/150 mL  Status:  Discontinued        750 mg 150 mL/hr over 60 Minutes Intravenous Every 24 hours 05/23/20 1914 05/24/20 0811   05/24/20 1400  ceFEPIme (MAXIPIME) 2 g in sodium chloride 0.9 % 100 mL IVPB        2 g 200 mL/hr over 30 Minutes Intravenous Every 8 hours 05/24/20 0811     05/24/20 0900  vancomycin (VANCOCIN) IVPB 1000 mg/200 mL premix  Status:  Discontinued        1,000 mg 200 mL/hr over 60 Minutes Intravenous Every 12 hours 05/24/20 0811 05/25/20 0918   05/24/20 0800  ceFEPIme (MAXIPIME) 2 g in sodium chloride 0.9 % 100 mL IVPB  Status:  Discontinued        2 g 200 mL/hr over 30 Minutes Intravenous Every 12 hours 05/23/20 1914 05/24/20 0811   05/23/20 2200  clindamycin (CLEOCIN) IVPB 600 mg  Status:  Discontinued        600 mg 100 mL/hr over 30 Minutes Intravenous Every 8 hours 05/23/20 2129 05/26/20 0853   05/23/20 1830  vancomycin (VANCOREADY) IVPB 1500 mg/300 mL        1,500 mg 150 mL/hr over 120  Minutes Intravenous  Once 05/23/20 1813 05/23/20 2229   05/23/20 1800  ceFEPIme (MAXIPIME) 2 g in sodium chloride 0.9 % 100 mL IVPB        2 g 200 mL/hr over 30 Minutes Intravenous  Once 05/23/20 1750 05/23/20 1934   05/23/20 1800  metroNIDAZOLE (FLAGYL) IVPB 500 mg        500 mg 100 mL/hr over 60 Minutes Intravenous  Once 05/23/20 1750 05/23/20 2027   05/23/20 1800  vancomycin (VANCOCIN) IVPB 1000 mg/200 mL premix  Status:  Discontinued        1,000 mg 200 mL/hr over 60 Minutes Intravenous  Once 05/23/20 1750 05/23/20 1813       Assessment/Plan NSTI of the buttock and retroperitoneal abscess- S/P debridement by Dr. Derrell Lolling 12/20&12/22. Cont BID dressing changeswithDakins. Hydrotherapy.Monitor closely to ensure does not need to go back to the OR.CT 12/29 without residual abscess, there is a decreased small amount of retroperitoneal gas around the right psoas muscle without evidence of abscess. Cx's with Gardnerella vaginalis, few Prevotella species, beta  lactamase positive. We have asked ID to consult to assist with abx coverage. I discussed with MD, will make NPO at midnight and await ID's recs. May need to go back to the OR to have areas of tracking opened up. Continue to mobilize and encourage pulm toilet in post op setting.  Acute hypoxic ventilator dependent respiratory failure- Of vent.On RA. SOB improved. No chest pain. EKG NSR. CXR with possible bibasilar opacities. Already on abx. CT noted small b/l pleural effusions. Trace LE edema on exam today. Cont home lasix. Give extra 20mg  this am. Her K has improved to 3.6 this am. Will ask medicine to come back and assist. Hx DM2 -A1c 8.9.SSI. Persistent hyperglycemia. Diabetic coordinator consult. Changes made per their recs. Hx CVA- on Plavix at home.Was on hold in case she needed further OR, restarted by [facility] 12/26. Stopped again 12/27.  HLD - crestor Hx of gastric ulcer - Pepcid. Avoid nsaids Mult psych meds - home meds ABL Anemia - hgb stable Obesity BMI 33.79 FEN-CM diet.NPO at midnight. ID- Cx's with Gardnerella vaginalis, few Prevotella species, beta lactamase positive. We have asked ID to consult to assist with abx coverage. Continue Cefepime/Flagyl for now. Vanc stopped on the 27th. Urinalysis for urinary symptoms.  Foley-Out. Voiding VTE-SCDs,Lovenox Dispo-Med surg. ID and TRH consult. NPO at midnight.   LOS: 9 days    28 , [facility] 06/01/2020, 8:13 AM Please see Amion for pager number during day hours 7:00am-4:30pm

## 2020-06-02 ENCOUNTER — Inpatient Hospital Stay (HOSPITAL_COMMUNITY): Payer: Medicare Other

## 2020-06-02 ENCOUNTER — Inpatient Hospital Stay: Payer: Self-pay

## 2020-06-02 DIAGNOSIS — M7989 Other specified soft tissue disorders: Secondary | ICD-10-CM

## 2020-06-02 DIAGNOSIS — R52 Pain, unspecified: Secondary | ICD-10-CM

## 2020-06-02 LAB — BASIC METABOLIC PANEL
Anion gap: 8 (ref 5–15)
BUN: 14 mg/dL (ref 6–20)
CO2: 34 mmol/L — ABNORMAL HIGH (ref 22–32)
Calcium: 7.6 mg/dL — ABNORMAL LOW (ref 8.9–10.3)
Chloride: 95 mmol/L — ABNORMAL LOW (ref 98–111)
Creatinine, Ser: 1.06 mg/dL — ABNORMAL HIGH (ref 0.44–1.00)
GFR, Estimated: >60 mL/min (ref >60)
Glucose, Bld: 177 mg/dL — ABNORMAL HIGH (ref 70–99)
Potassium: 3.3 mmol/L — ABNORMAL LOW (ref 3.5–5.1)
Sodium: 137 mmol/L (ref 135–145)

## 2020-06-02 LAB — CBC
HCT: 32.9 % — ABNORMAL LOW (ref 36.0–46.0)
Hemoglobin: 11 g/dL — ABNORMAL LOW (ref 12.0–15.0)
MCH: 30.1 pg (ref 26.0–34.0)
MCHC: 33.4 g/dL (ref 30.0–36.0)
MCV: 90.1 fL (ref 80.0–100.0)
Platelets: 433 10*3/uL — ABNORMAL HIGH (ref 150–400)
RBC: 3.65 MIL/uL — ABNORMAL LOW (ref 3.87–5.11)
RDW: 15.8 % — ABNORMAL HIGH (ref 11.5–15.5)
WBC: 20.2 10*3/uL — ABNORMAL HIGH (ref 4.0–10.5)
nRBC: 0 % (ref 0.0–0.2)

## 2020-06-02 LAB — GLUCOSE, CAPILLARY
Glucose-Capillary: 126 mg/dL — ABNORMAL HIGH (ref 70–99)
Glucose-Capillary: 159 mg/dL — ABNORMAL HIGH (ref 70–99)
Glucose-Capillary: 172 mg/dL — ABNORMAL HIGH (ref 70–99)
Glucose-Capillary: 221 mg/dL — ABNORMAL HIGH (ref 70–99)
Glucose-Capillary: 223 mg/dL — ABNORMAL HIGH (ref 70–99)
Glucose-Capillary: 299 mg/dL — ABNORMAL HIGH (ref 70–99)

## 2020-06-02 MED ORDER — SODIUM CHLORIDE 0.9% FLUSH
10.0000 mL | Freq: Two times a day (BID) | INTRAVENOUS | Status: DC
  Administered 2020-06-03 – 2020-06-10 (×8): 10 mL

## 2020-06-02 MED ORDER — FUROSEMIDE 40 MG PO TABS
40.0000 mg | ORAL_TABLET | Freq: Every day | ORAL | Status: DC
  Administered 2020-06-03 – 2020-06-05 (×3): 40 mg via ORAL
  Filled 2020-06-02 (×3): qty 1

## 2020-06-02 MED ORDER — DAKINS (1/4 STRENGTH) 0.125 % EX SOLN
Freq: Three times a day (TID) | CUTANEOUS | Status: DC
  Filled 2020-06-02 (×2): qty 473

## 2020-06-02 MED ORDER — SODIUM CHLORIDE 0.9% FLUSH: 10.0000 mL | INTRAVENOUS | Status: DC | PRN

## 2020-06-02 MED ORDER — CHLORHEXIDINE GLUCONATE 0.12 % MT SOLN
OROMUCOSAL | Status: AC
  Filled 2020-06-02: qty 15

## 2020-06-02 MED ORDER — POTASSIUM CHLORIDE CRYS ER 20 MEQ PO TBCR
40.0000 meq | EXTENDED_RELEASE_TABLET | Freq: Once | ORAL | Status: AC
  Administered 2020-06-02: 40 meq via ORAL
  Filled 2020-06-02: qty 2

## 2020-06-02 MED ORDER — LINEZOLID 600 MG PO TABS
600.0000 mg | ORAL_TABLET | Freq: Two times a day (BID) | ORAL | Status: AC
  Administered 2020-06-02 – 2020-06-05 (×8): 600 mg via ORAL
  Filled 2020-06-02 (×8): qty 1

## 2020-06-02 MED ORDER — ACETIC ACID 5 % SOLN
Freq: Three times a day (TID) | Status: DC
  Administered 2020-06-04 – 2020-06-05 (×2): 1 via TOPICAL
  Filled 2020-06-02 (×6): qty 500

## 2020-06-02 MED ORDER — INSULIN ASPART 100 UNIT/ML ~~LOC~~ SOLN
3.0000 [IU] | Freq: Three times a day (TID) | SUBCUTANEOUS | Status: DC
  Administered 2020-06-03 – 2020-06-05 (×8): 3 [IU] via SUBCUTANEOUS

## 2020-06-02 MED ORDER — ACETIC ACID 0.25 % IR SOLN: Freq: Every day | Status: DC

## 2020-06-02 NOTE — Progress Notes (Signed)
Patient ID: Kristie Tapia, female   DOB: 11-13-1970, 49 y.o.   MRN: [id number] [facility] Surgery Progress Note:   8 Days Post-Op  Subjective: Mental status is clear.  Complaints nursing is not attentive to cleaning her after defecation.  She wants to be transferred to [facility]. Objective: Vital signs in last 24 hours: Temp:  [97.9 F (36.6 C)-98.6 F (37 C)] 98.6 F (37 C) (12/30 0734) Pulse Rate:  [61-85] 61 (12/30 0734) Resp:  [16-18] 16 (12/30 0734) BP: (118-129)/(63-71) 122/71 (12/30 0734) SpO2:  [91 %-100 %] 94 % (12/30 0734)  Intake/Output from previous day: 12/29 0701 - 12/30 0700 In: 460 [P.O.:360; IV Piggyback:100] Out: -  Intake/Output this shift: No intake/output data recorded.  Physical Exam: Work of breathing is not labored.  Buttock unpacked and examined.  This is a large well drained opening.  There is thin greenish exudate noted and the packing appears non cotton.    Lab Results:  Results for orders placed or performed during the hospital encounter of 05/23/20 (from the past 48 hour(s))  Glucose, capillary     Status: Abnormal   Collection Time: 05/31/20 11:09 AM  Result Value Ref Range   Glucose-Capillary 152 (H) 70 - 99 mg/dL    Comment: Glucose reference range applies only to samples taken after fasting for at least 8 hours.  Glucose, capillary     Status: Abnormal   Collection Time: 05/31/20  4:52 PM  Result Value Ref Range   Glucose-Capillary 195 (H) 70 - 99 mg/dL    Comment: Glucose reference range applies only to samples taken after fasting for at least 8 hours.  Glucose, capillary     Status: Abnormal   Collection Time: 05/31/20  8:00 PM  Result Value Ref Range   Glucose-Capillary 170 (H) 70 - 99 mg/dL    Comment: Glucose reference range applies only to samples taken after fasting for at least 8 hours.  Glucose, capillary     Status: Abnormal   Collection Time: 05/31/20 11:38 PM  Result Value Ref Range   Glucose-Capillary 189 (H) 70 - 99 mg/dL     Comment: Glucose reference range applies only to samples taken after fasting for at least 8 hours.  CBC     Status: Abnormal   Collection Time: 06/01/20  1:41 AM  Result Value Ref Range   WBC 21.8 (H) 4.0 - 10.5 K/uL   RBC 3.89 3.87 - 5.11 MIL/uL   Hemoglobin 11.6 (L) 12.0 - 15.0 g/dL   HCT 37.0 (L) 48.8 - 89.1 %   MCV 89.2 80.0 - 100.0 fL   MCH 29.8 26.0 - 34.0 pg   MCHC 33.4 30.0 - 36.0 g/dL   RDW 69.4 (H) 50.3 - 88.8 %   Platelets 438 (H) 150 - 400 K/uL   nRBC 0.0 0.0 - 0.2 %    Comment: Performed at [facility], [address], [place], [place] [address]  Basic metabolic panel     Status: Abnormal   Collection Time: 06/01/20  1:41 AM  Result Value Ref Range   Sodium 138 135 - 145 mmol/L   Potassium 3.6 3.5 - 5.1 mmol/L   Chloride 102 98 - 111 mmol/L   CO2 28 22 - 32 mmol/L   Glucose, Bld 160 (H) 70 - 99 mg/dL    Comment: Glucose reference range applies only to samples taken after fasting for at least 8 hours.   BUN 7 6 - 20 mg/dL   Creatinine, Ser  0.88 0.44 - 1.00 mg/dL   Calcium 7.6 (L) 8.9 - 10.3 mg/dL   GFR, Estimated >16 >10 mL/min    Comment: (NOTE) Calculated using the CKD-EPI Creatinine Equation (2021)    Anion gap 8 5 - 15    Comment: Performed at [facility], [address] [place], [place] [address]  Glucose, capillary     Status: Abnormal   Collection Time: 06/01/20  3:21 AM  Result Value Ref Range   Glucose-Capillary 185 (H) 70 - 99 mg/dL    Comment: Glucose reference range applies only to samples taken after fasting for at least 8 hours.  Glucose, capillary     Status: Abnormal   Collection Time: 06/01/20  8:18 AM  Result Value Ref Range   Glucose-Capillary 156 (H) 70 - 99 mg/dL    Comment: Glucose reference range applies only to samples taken after fasting for at least 8 hours.  Glucose, capillary     Status: Abnormal   Collection Time: 06/01/20 11:09 AM  Result Value Ref Range   Glucose-Capillary 184 (H) 70 - 99 mg/dL    Comment:  Glucose reference range applies only to samples taken after fasting for at least 8 hours.  Brain natriuretic peptide     Status: Abnormal   Collection Time: 06/01/20  1:53 PM  Result Value Ref Range   B Natriuretic Peptide 315.2 (H) 0.0 - 100.0 pg/mL    Comment: Performed at [facility], [address], [place], [place] [address]  Prealbumin     Status: Abnormal   Collection Time: 06/01/20  1:53 PM  Result Value Ref Range   Prealbumin 10.3 (L) 18 - 38 mg/dL    Comment: Performed at [facility], [address] [place], [place] [address]  TSH     Status: Abnormal   Collection Time: 06/01/20  1:53 PM  Result Value Ref Range   TSH 5.371 (H) 0.350 - 4.500 uIU/mL    Comment: Performed by a 3rd Generation assay with a functional sensitivity of <=0.01 uIU/mL. Performed at [facility], [address], [place], [place] [address]   Urinalysis, Complete w Microscopic     Status: Abnormal   Collection Time: 06/01/20  3:34 PM  Result Value Ref Range   Color, Urine YELLOW YELLOW   APPearance CLEAR CLEAR   Specific Gravity, Urine 1.008 1.005 - 1.030   pH 5.0 5.0 - 8.0   Glucose, UA NEGATIVE NEGATIVE mg/dL   Hgb urine dipstick SMALL (A) NEGATIVE   Bilirubin Urine NEGATIVE NEGATIVE   Ketones, ur NEGATIVE NEGATIVE mg/dL   Protein, ur NEGATIVE NEGATIVE mg/dL   Nitrite NEGATIVE NEGATIVE   Leukocytes,Ua NEGATIVE NEGATIVE   RBC / HPF 0-5 0 - 5 RBC/hpf   WBC, UA 0-5 0 - 5 WBC/hpf   Bacteria, UA RARE (A) NONE SEEN   Squamous Epithelial / LPF 0-5 0 - 5   Mucus PRESENT    Hyaline Casts, UA PRESENT     Comment: Performed at [facility], [address], [place], [place] [address]  Glucose, capillary     Status: Abnormal   Collection Time: 06/01/20  4:17 PM  Result Value Ref Range   Glucose-Capillary 142 (H) 70 - 99 mg/dL    Comment: Glucose reference range applies only to samples taken after fasting for at least 8 hours.  Glucose, capillary     Status: Abnormal    Collection Time: 06/01/20  7:47 PM  Result  Value Ref Range   Glucose-Capillary 213 (H) 70 - 99 mg/dL    Comment: Glucose reference range applies only to samples taken after fasting for at least 8 hours.  Glucose, capillary     Status: Abnormal   Collection Time: 06/01/20 11:52 PM  Result Value Ref Range   Glucose-Capillary 209 (H) 70 - 99 mg/dL    Comment: Glucose reference range applies only to samples taken after fasting for at least 8 hours.  CBC     Status: Abnormal   Collection Time: 06/02/20  2:20 AM  Result Value Ref Range   WBC 20.2 (H) 4.0 - 10.5 K/uL   RBC 3.65 (L) 3.87 - 5.11 MIL/uL   Hemoglobin 11.0 (L) 12.0 - 15.0 g/dL   HCT 16.132.9 (L) 09.636.0 - 04.546.0 %   MCV 90.1 80.0 - 100.0 fL   MCH 30.1 26.0 - 34.0 pg   MCHC 33.4 30.0 - 36.0 g/dL   RDW 40.915.8 (H) 81.111.5 - 91.415.5 %   Platelets 433 (H) 150 - 400 K/uL   nRBC 0.0 0.0 - 0.2 %    Comment: Performed at [facility], [address] [place], [place] [address]  Basic metabolic panel     Status: Abnormal   Collection Time: 06/02/20  2:20 AM  Result Value Ref Range   Sodium 137 135 - 145 mmol/L   Potassium 3.3 (L) 3.5 - 5.1 mmol/L   Chloride 95 (L) 98 - 111 mmol/L   CO2 34 (H) 22 - 32 mmol/L   Glucose, Bld 177 (H) 70 - 99 mg/dL    Comment: Glucose reference range applies only to samples taken after fasting for at least 8 hours.   BUN 14 6 - 20 mg/dL   Creatinine, Ser 6.211.06 (H) 0.44 - 1.00 mg/dL   Calcium 7.6 (L) 8.9 - 10.3 mg/dL   GFR, Estimated >30>60 >86>60 mL/min    Comment: (NOTE) Calculated using the CKD-EPI Creatinine Equation (2021)    Anion gap 8 5 - 15    Comment: Performed at [facility], [address] [place], [place] [address]  Glucose, capillary     Status: Abnormal   Collection Time: 06/02/20  4:36 AM  Result Value Ref Range   Glucose-Capillary 159 (H) 70 - 99 mg/dL    Comment: Glucose reference range applies only to samples taken after fasting for at least 8 hours.  Glucose, capillary     Status:  Abnormal   Collection Time: 06/02/20  7:31 AM  Result Value Ref Range   Glucose-Capillary 126 (H) 70 - 99 mg/dL    Comment: Glucose reference range applies only to samples taken after fasting for at least 8 hours.    Radiology/Results: CT ABDOMEN PELVIS W CONTRAST  Result Date: 05/31/2020 CLINICAL DATA:  Abdominal pain and fever. Status post debridement of perianal/buttock abscess. EXAM: CT ABDOMEN AND PELVIS WITH CONTRAST TECHNIQUE: Multidetector CT imaging of the abdomen and pelvis was performed using the standard protocol following bolus administration of intravenous contrast. CONTRAST:  100mL OMNIPAQUE IOHEXOL 300 MG/ML  SOLN COMPARISON:  05/23/2020 FINDINGS: Lower Chest: New small bilateral pleural effusions. Hepatobiliary: No hepatic masses identified. Prior cholecystectomy. No evidence of biliary obstruction. Pancreas:  No mass or inflammatory changes. Spleen: Stable moderate splenomegaly measuring approximately 16 cm in length. Stable small less than 1 cm low-attenuation lesion in the inferior aspect of the spleen dating back to 09/02/2017, consistent with benign etiology. Adrenals/Urinary Tract: No masses identified. No evidence of ureteral calculi  or hydronephrosis. Gas noted within the urinary bladder, likely due to recent instrumentation. Stomach/Bowel: No evidence of obstruction, inflammatory process or abnormal fluid collections. Vascular/Lymphatic: Stable shotty sub-cm left paraaortic and bilateral iliac lymph nodes noted. No pathologically enlarged lymph nodes. No abdominal aortic aneurysm. Reproductive: Prior hysterectomy noted. Adnexal regions are unremarkable in appearance. Other: Gas is seen within the surgical bed in the right perianal soft tissues, without evidence of residual abscess. A small amount of retroperitoneal gas is again seen surrounding the right psoas muscle, but is decreased since previous study. Soft tissue stranding is seen along the right pelvic sidewall, without  evidence of abscess. New mild perihepatic ascites, and diffuse mesenteric and body wall edema noted. Musculoskeletal:  No suspicious bone lesions identified. IMPRESSION: Gas in surgical bed in right perianal soft tissues, without residual abscess. Decreased small amount of retroperitoneal gas surrounding the right psoas muscle. No evidence of abscess. New small bilateral pleural effusions, diffuse mesenteric and body wall edema, and mild perihepatic ascites, consistent with 3rd spacing. Stable splenomegaly. Electronically Signed   By: Danae Orleans M.D.   On: 05/31/2020 17:08   DG CHEST PORT 1 VIEW  Result Date: 05/31/2020 CLINICAL DATA:  Shortness of breath. EXAM: PORTABLE CHEST 1 VIEW COMPARISON:  May 23, 2020. FINDINGS: Left greater than right bibasilar airspace opacities. Left basilar opacity is dense with silhouetting of the left hemidiaphragm. No visible pleural effusions or pneumothorax. Stable cardiomediastinal silhouette, within normal limits. No acute osseous abnormality. IMPRESSION: Left greater than right bibasilar airspace opacities, concerning for pneumonia. Electronically Signed   By: Feliberto Harts MD   On: 05/31/2020 10:45    Anti-infectives: Anti-infectives (From admission, onward)   Start     Dose/Rate Route Frequency Ordered Stop   06/01/20 1400  piperacillin-tazobactam (ZOSYN) IVPB 3.375 g        3.375 g 12.5 mL/hr over 240 Minutes Intravenous Every 8 hours 06/01/20 0945     05/26/20 1400  metroNIDAZOLE (FLAGYL) tablet 500 mg  Status:  Discontinued        500 mg Oral Every 8 hours 05/26/20 0853 06/01/20 0939   05/25/20 2200  vancomycin (VANCOCIN) IVPB 1000 mg/200 mL premix  Status:  Discontinued        1,000 mg 200 mL/hr over 60 Minutes Intravenous Every 12 hours 05/25/20 0918 05/30/20 1129   05/24/20 1800  vancomycin (VANCOREADY) IVPB 750 mg/150 mL  Status:  Discontinued        750 mg 150 mL/hr over 60 Minutes Intravenous Every 24 hours 05/23/20 1914 05/24/20 0811    05/24/20 1400  ceFEPIme (MAXIPIME) 2 g in sodium chloride 0.9 % 100 mL IVPB  Status:  Discontinued        2 g 200 mL/hr over 30 Minutes Intravenous Every 8 hours 05/24/20 0811 06/01/20 0939   05/24/20 0900  vancomycin (VANCOCIN) IVPB 1000 mg/200 mL premix  Status:  Discontinued        1,000 mg 200 mL/hr over 60 Minutes Intravenous Every 12 hours 05/24/20 0811 05/25/20 0918   05/24/20 0800  ceFEPIme (MAXIPIME) 2 g in sodium chloride 0.9 % 100 mL IVPB  Status:  Discontinued        2 g 200 mL/hr over 30 Minutes Intravenous Every 12 hours 05/23/20 1914 05/24/20 0811   05/23/20 2200  clindamycin (CLEOCIN) IVPB 600 mg  Status:  Discontinued        600 mg 100 mL/hr over 30 Minutes Intravenous Every 8 hours 05/23/20 2129 05/26/20 0853   05/23/20  1830  vancomycin (VANCOREADY) IVPB 1500 mg/300 mL        1,500 mg 150 mL/hr over 120 Minutes Intravenous  Once 05/23/20 1813 05/23/20 2229   05/23/20 1800  ceFEPIme (MAXIPIME) 2 g in sodium chloride 0.9 % 100 mL IVPB        2 g 200 mL/hr over 30 Minutes Intravenous  Once 05/23/20 1750 05/23/20 1934   05/23/20 1800  metroNIDAZOLE (FLAGYL) IVPB 500 mg        500 mg 100 mL/hr over 60 Minutes Intravenous  Once 05/23/20 1750 05/23/20 2027   05/23/20 1800  vancomycin (VANCOCIN) IVPB 1000 mg/200 mL premix  Status:  Discontinued        1,000 mg 200 mL/hr over 60 Minutes Intravenous  Once 05/23/20 1750 05/23/20 1813      Assessment/Plan: Problem List: Patient Active Problem List   Diagnosis Date Noted  . Status post surgery 05/23/2020  . Necrotizing soft tissue infection 05/23/2020  . Lower respiratory infection 07/06/2018  . Cough 07/06/2018  . Chronic musculoskeletal pain 07/06/2018  . CVA (cerebral vascular accident) (HCC) 12/27/2017  . Tobacco abuse 12/27/2017  . Obesity 12/27/2017  . Ischemic stroke (HCC) 12/27/2017  . Diabetic peripheral neuropathy (HCC) 12/27/2017  . Smoker unmotivated to quit 07/07/2017  . Chronic GERD 07/07/2017  . Chronic  anxiety 07/07/2017  . Dyslipidemia 02/24/2017  . Diabetes (HCC) 02/24/2017  . Headache 02/24/2017    Will change to Kerlex packing and will alternate Dakins with 0.5% acetic acid soaks for pseudomonas aeruginosa 8 Days Post-Op    LOS: 10 days   Matt B. Daphine Deutscher, MD, [facility] [phone] to reach the surgeon on call.    06/02/2020 9:32 AM

## 2020-06-02 NOTE — Progress Notes (Signed)
Brief ID Note:   Noted surgery's plans for dressing changes to include Dakin's solution paired with hydrotherapy. She remains afebrile and WBC a little better today 20.2K. May have incomplete resolution while on hydrotherapy daily but hopeful for a downward trend.   Wound Length (cm) 11.5 cm 05/29/20 0924  Wound Width (cm) 5.8 cm 05/29/20 0924  Wound Depth (cm) 10 cm 05/29/20 8101   Drainage: Purulent    Will continue Zosyn IV for her and re-evaluate wound progress with intermittent follow up to help determine further length of therapy.   She has utilized much less antiemetic the last 12 hours since stopping the flagyl.    Rexene Alberts, MSN, NP-C [facility]  [email or web address] Pager: [phone] Office: [phone] [facility] Main Line: [phone]

## 2020-06-02 NOTE — Progress Notes (Signed)
Bilateral Upper extremity venous US completed.    Please see CV Proc for preliminary results.   Clint Guy, RVT

## 2020-06-02 NOTE — Progress Notes (Signed)
Physical Therapy Wound Treatment Patient Details  Name: Kristie Tapia MRN: [id number] Date of Birth: 1970-10-01  Today's Date: 06/02/2020 Time: 1771-1657 Time Calculation (min): 23 min  Subjective  Subjective: Agreeable to hydrotherapy - reports she is going for imaging later so surgery can assess whether she needs another I&D. Patient and Family Stated Goals: Avoid another surgery Date of Onset: 05/23/20 (first I&D) Prior Treatments: I&D in OR x 2  Pain Score: Pt tolerated treatment well with premedication ~1 hour prior to hydrotherapy.   Wound Assessment  Wound / Incision (Open or Dehisced) 05/24/20 Incision - Dehisced Buttocks Right (Active)  Dressing Type ABD;Barrier Film (skin prep);Gauze (Comment);Moist to dry 06/02/20 0943  Dressing Changed Changed 06/02/20 0943  Dressing Status Clean;Dry;Intact 06/02/20 0943  Dressing Change Frequency Daily 06/02/20 0943  Site / Wound Assessment Yellow;Pink;Black;Brown 06/02/20 0943  % Wound base Red or Granulating 30% 06/02/20 0943  % Wound base Yellow/Fibrinous Exudate 60% 06/02/20 0943  % Wound base Black/Eschar 10% 06/02/20 0943  % Wound base Other/Granulation Tissue (Comment) 0% 06/02/20 0943  Peri-wound Assessment Intact 06/02/20 0943  Wound Length (cm) 11.5 cm 05/29/20 0924  Wound Width (cm) 5.8 cm 05/29/20 0924  Wound Depth (cm) 10 cm 05/29/20 0924  Wound Volume (cm^3) 667 cm^3 05/29/20 0924  Wound Surface Area (cm^2) 66.7 cm^2 05/29/20 0924  Tunneling (cm) 8 05/30/20 2245  Margins Unattached edges (unapproximated) 06/02/20 0943  Closure None 06/02/20 0943  Drainage Amount Minimal 06/02/20 0943  Drainage Description Purulent 06/02/20 0943  Treatment Debridement (Selective);Hydrotherapy (Pulse lavage);Packing (Dakin's soaked gauze) 06/02/20 0943   Hydrotherapy Pulsed lavage therapy - wound location: rt buttock Pulsed Lavage with Suction (psi): 12 psi Pulsed Lavage with Suction - Normal Saline Used: 1000 mL Pulsed Lavage  Tip: Tip with splash shield Selective Debridement Selective Debridement - Location: rt buttock Selective Debridement - Tools Used: Forceps;Scalpel Selective Debridement - Tissue Removed: yellow and black necrotic adipose and unviable tissue   Wound Assessment and Plan  Wound Therapy - Assess/Plan/Recommendations Wound Therapy - Clinical Statement: Some pink tissue emerging through necrotic adipose, and progressing with debridement. Will continue to follow for selective removal of unviable tissue, to decrease bioburden and promote wound bed healing. Wound Therapy - Functional Problem List: mobility limited due to pain and inadvisable to sit for prolonged periods due to wound location Factors Delaying/Impairing Wound Healing: Infection - systemic/local;Immobility;Diabetes Mellitus Hydrotherapy Plan: Debridement;Dressing change;Patient/family education;Pulsatile lavage with suction Wound Therapy - Frequency: 6X / week Wound Therapy - Follow Up Recommendations: Home health RN Wound Plan: see above  Wound Therapy Goals- Improve the function of patient's integumentary system by progressing the wound(s) through the phases of wound healing (inflammation - proliferation - remodeling) by: Decrease Necrotic Tissue to: <40% Decrease Necrotic Tissue - Progress: Progressing toward goal Increase Granulation Tissue to: >40% (allowing 20% for healthy fat) Increase Granulation Tissue - Progress: Progressing toward goal Decrease Length/Width/Depth by (cm): 0/0/1.0cm Decrease Length/Width/Depth - Progress: Progressing toward goal Improve Drainage Characteristics: Min;Serous Improve Drainage Characteristics - Progress: Progressing toward goal Patient/Family will be able to : verbalize wound dressing application to instruct future caregivers Patient/Family Instruction Goal - Progress: Progressing toward goal Goals/treatment plan/discharge plan were made with and agreed upon by patient/family: Yes Time For  Goal Achievement: 7 days Wound Therapy - Potential for Goals: Fair  Goals will be updated until maximal potential achieved or discharge criteria met.  Discharge criteria: when goals achieved, discharge from hospital, MD decision/surgical intervention, no progress towards goals, refusal/missing three consecutive treatments without notification or  medical reason.  GP     Thelma Comp 06/02/2020, 9:51 AM   Rolinda Roan, PT, DPT Acute Rehabilitation Services Pager: [phone] Office: [phone]

## 2020-06-02 NOTE — Progress Notes (Signed)
Peripherally Inserted Central Catheter Placement  The IV Nurse has discussed with the patient and/or persons authorized to consent for the patient, the purpose of this procedure and the potential benefits and risks involved with this procedure.  The benefits include less needle sticks, lab draws from the catheter, and the patient may be discharged home with the catheter. Risks include, but not limited to, infection, bleeding, blood clot (thrombus formation), and puncture of an artery; nerve damage and irregular heartbeat and possibility to perform a PICC exchange if needed/ordered by physician.  Alternatives to this procedure were also discussed.  Bard Power PICC patient education guide, fact sheet on infection prevention and patient information card has been provided to patient /or left at bedside.    PICC Placement Documentation  PICC Single Lumen 06/02/20 PICC Right Basilic 38 cm 1 cm (Active)  Indication for Insertion or Continuance of Line Poor Vasculature-patient has had multiple peripheral attempts or PIVs lasting less than 24 hours 06/02/20 1600  Exposed Catheter (cm) 1 cm 06/02/20 1600  Site Assessment Clean;Dry;Intact 06/02/20 1600  Line Status Flushed;Saline locked;Blood return noted 06/02/20 1600  Dressing Type Transparent;Securing device 06/02/20 1600  Dressing Status Clean;Dry;Intact 06/02/20 1600  Antimicrobial disc in place? Yes 06/02/20 1600  Safety Lock Not Applicable 06/02/20 1600  Line Care Connections checked and tightened 06/02/20 1600  Dressing Intervention New dressing 06/02/20 1600  Dressing Change Due 06/09/20 06/02/20 1600       [name], [name] 06/02/2020, 4:51 PM

## 2020-06-02 NOTE — Progress Notes (Signed)
PROGRESS NOTE    Kristie Tapia  JJO:[id number] DOB: 11/17/1970 DOA: 05/23/2020 PCP: System, Provider Not In   Chief Complaint  Patient presents with  . Abdominal Pain    Brief Narrative:  Kristie Lowdermilkis an 49 y.o.femalewith pmh uncontrolled diabetes mellitus type 2, CVA, OSA, and chronic pain who was admitted on 12/20 for necrotizing fasciitis of the R buttock.She is awake and alert. She reported that she started with Elpidio Thielen boil on her buttock and tried to pop it. It spread and she tried to lance it without success. She as having abdominal pain as well and went to urgent care and eventually had Angelena Sand CT showing severe infection and so came in and had debridement.   Assessment & Plan:   Principal Problem:   Necrotizing soft tissue infection Active Problems:   Dyslipidemia   Diabetes (HCC)   Smoker unmotivated to quit   CVA (cerebral vascular accident) (HCC)   Obesity   Chronic musculoskeletal pain   Necrotizing soft tissue infection of buttock  -Status post I&D on 12/20 and 12/22  - wound care per surgery, hydrotherapy -WBC still elevated at 20.2 -Cultures positive for Prevotella (+beta lactamase) and gardnerella vaginalis -Initially started on vancomycin, cefepime, and Flagyl . Antibiotics switched to Zosyn per ID on 12/29 - of note, pt requested transfer 12/30 duet to concern about pt care (dressing changes, etc) -> defer to primary team   Diabetes mellitus type 2 (uncontrolled) -Patient admits to poor adherence to diabetic regimen at home -Hemoglobin A1c 8.9 -Hold Actos -Continue Lantus 22 units daily - will add mealtime  -Continue CBGs with meals with moderate SSI -Appreciate diabetic education and adjust regimen as   Normocytic anemia: Stable.  Mild downtrend.   -Continue to monitor   Enterococcus UTI: follow final cx, no clear symptoms, will discuss with ID   Hypoalbuminemia/moderate protein calorie malnutrition: Acute.  Albumin was noted to be low  at 2.7 on 12/20.  Suspect hypoalbuminemia and recent surgical procedures likely leading to third spacing as cause for swelling. -prealbumin 10.3 -Glucerna in between meals -Continue using Lasix as needed to help with fluid   Diastolic dysfunction -Last EF noted to be 55 to 60% with grade 1 diastolic dysfunction in 2019 -Patient with 2+ pitting edema in the lower extremies generalized -Strict intake and output -Daily weights -Check BNP 329 -Increase lasix to 40 mg PO daily, continue to monitor  -Reassess in Lorelie Biermann.m. and determine if patient needs continued IV diuresis   Superficial Venous Thrombosis  Bilateral upper extremity cephalic vein superficial vein thrombosis    History of CVA -Continue to hold Plavix for now and resume when medically appropriate   Depression and anxiety -Continue current regimen   Early onset dementia -Continue Aricept   GERD -Continue Protonix twice daily  DVT prophylaxis: lovenox Code Status: full  Family Communication: mother at bedside Disposition:   Status is: Inpatient  Remains inpatient appropriate because:Inpatient level of care appropriate due to severity of illness   Dispo: The patient is from: Home              Anticipated d/c is to: pending              Anticipated d/c date is: > 3 days              Patient currently is not medically stable to d/c.   Consultants:   TRH consulting  ID  General surgery is primary  Procedures:  I&D x 2  Antimicrobials:  Anti-infectives (  From admission, onward)   Start     Dose/Rate Route Frequency Ordered Stop   06/02/20 1545  linezolid (ZYVOX) tablet 600 mg        600 mg Oral Every 12 hours 06/02/20 1452     06/01/20 1400  piperacillin-tazobactam (ZOSYN) IVPB 3.375 g        3.375 g 12.5 mL/hr over 240 Minutes Intravenous Every 8 hours 06/01/20 0945     05/26/20 1400  metroNIDAZOLE (FLAGYL) tablet 500 mg  Status:  Discontinued        500 mg Oral Every 8 hours 05/26/20 0853  06/01/20 0939   05/25/20 2200  vancomycin (VANCOCIN) IVPB 1000 mg/200 mL premix  Status:  Discontinued        1,000 mg 200 mL/hr over 60 Minutes Intravenous Every 12 hours 05/25/20 0918 05/30/20 1129   05/24/20 1800  vancomycin (VANCOREADY) IVPB 750 mg/150 mL  Status:  Discontinued        750 mg 150 mL/hr over 60 Minutes Intravenous Every 24 hours 05/23/20 1914 05/24/20 0811   05/24/20 1400  ceFEPIme (MAXIPIME) 2 g in sodium chloride 0.9 % 100 mL IVPB  Status:  Discontinued        2 g 200 mL/hr over 30 Minutes Intravenous Every 8 hours 05/24/20 0811 06/01/20 0939   05/24/20 0900  vancomycin (VANCOCIN) IVPB 1000 mg/200 mL premix  Status:  Discontinued        1,000 mg 200 mL/hr over 60 Minutes Intravenous Every 12 hours 05/24/20 0811 05/25/20 0918   05/24/20 0800  ceFEPIme (MAXIPIME) 2 g in sodium chloride 0.9 % 100 mL IVPB  Status:  Discontinued        2 g 200 mL/hr over 30 Minutes Intravenous Every 12 hours 05/23/20 1914 05/24/20 0811   05/23/20 2200  clindamycin (CLEOCIN) IVPB 600 mg  Status:  Discontinued        600 mg 100 mL/hr over 30 Minutes Intravenous Every 8 hours 05/23/20 2129 05/26/20 0853   05/23/20 1830  vancomycin (VANCOREADY) IVPB 1500 mg/300 mL        1,500 mg 150 mL/hr over 120 Minutes Intravenous  Once 05/23/20 1813 05/23/20 2229   05/23/20 1800  ceFEPIme (MAXIPIME) 2 g in sodium chloride 0.9 % 100 mL IVPB        2 g 200 mL/hr over 30 Minutes Intravenous  Once 05/23/20 1750 05/23/20 1934   05/23/20 1800  metroNIDAZOLE (FLAGYL) IVPB 500 mg        500 mg 100 mL/hr over 60 Minutes Intravenous  Once 05/23/20 1750 05/23/20 2027   05/23/20 1800  vancomycin (VANCOCIN) IVPB 1000 mg/200 mL premix  Status:  Discontinued        1,000 mg 200 mL/hr over 60 Minutes Intravenous  Once 05/23/20 1750 05/23/20 1813      Subjective: No new complaints Asking for transfer to [facility] - concerned about wound care (discussed that that will be Mechelle Pates decision for primary team -  surgery)  Objective: Vitals:   06/01/20 1947 06/02/20 0324 06/02/20 0734 06/02/20 1457  BP: 118/63 129/64 122/71 (!) 121/55  Pulse: 74 81 61 75  Resp: 18 16 16 18   Temp: 98.4 F (36.9 C) 97.9 F (36.6 C) 98.6 F (37 C) 99.2 F (37.3 C)  TempSrc: Oral  Oral Oral  SpO2: 91% 100% 94% 93%  Weight:      Height:        Intake/Output Summary (Last 24 hours) at 06/02/2020 1643 Last data filed  at 06/02/2020 0600 Gross per 24 hour  Intake 100 ml  Output --  Net 100 ml   Filed Weights   05/23/20 1542 05/24/20 0200  Weight: 87.1 kg 92.1 kg    Examination:  General exam: Appears calm and comfortable  Respiratory system: Clear to auscultation. Respiratory effort normal. Cardiovascular system: S1 & S2 heard, RRR Gastrointestinal system: Abdomen is nondistended, soft and nontender. Central nervous system: Alert and oriented. No focal neurological deficits. Extremities: Symmetric 5 x 5 power. Skin: wound not examined today, recently dressing changed by RN - previous images reviewed Psychiatry: Judgement and insight appear normal. Mood & affect appropriate.     Data Reviewed: I have personally reviewed following labs and imaging studies  CBC: Recent Labs  Lab 05/29/20 1241 05/30/20 0236 05/31/20 0125 06/01/20 0141 06/02/20 0220  WBC 23.3* 21.6* 22.5* 21.8* 20.2*  HGB 12.3 11.9* 11.7* 11.6* 11.0*  HCT 37.4 33.7* 35.4* 34.7* 32.9*  MCV 89.7 88.2 89.2 89.2 90.1  PLT 454* 403* 438* 438* 433*    Basic Metabolic Panel: Recent Labs  Lab 05/29/20 1241 05/30/20 0236 05/31/20 0125 06/01/20 0141 06/02/20 0220  NA 139 141 139 138 137  K 4.0 3.1* 3.2* 3.6 3.3*  CL 105 107 104 102 95*  CO2 27 26 27 28  34*  GLUCOSE 195* 193* 136* 160* 177*  BUN 15 13 9 7 14   CREATININE 1.08* 1.06* 0.91 0.88 1.06*  CALCIUM 7.9* 7.8* 7.7* 7.6* 7.6*  MG  --  1.9  --   --   --     GFR: Estimated Creatinine Clearance: 72 mL/min (Elka Satterfield) (by C-G formula based on SCr of 1.06 mg/dL (H)).  Liver  Function Tests: No results for input(s): AST, ALT, ALKPHOS, BILITOT, PROT, ALBUMIN in the last 168 hours.  CBG: Recent Labs  Lab 06/01/20 1947 06/01/20 2352 06/02/20 0436 06/02/20 0731 06/02/20 1151  GLUCAP 213* 209* 159* 126* 221*     Recent Results (from the past 240 hour(s))  Blood Culture (routine x 2)     Status: None   Collection Time: 05/23/20  6:28 PM   Specimen: BLOOD LEFT ARM  Result Value Ref Range Status   Specimen Description BLOOD LEFT ARM  Final   Special Requests   Final    BOTTLES DRAWN AEROBIC AND ANAEROBIC Blood Culture results may not be optimal due to an inadequate volume of blood received in culture bottles   Culture   Final    NO GROWTH 5 DAYS Performed at [facility], [address], [place], [place] [address]    Report Status 05/28/2020 FINAL  Final  Resp Panel by RT-PCR (Flu Josuel Koeppen&B, Covid) Nasopharyngeal Swab     Status: None   Collection Time: 05/23/20  6:54 PM   Specimen: Nasopharyngeal Swab; Nasopharyngeal(NP) swabs in vial transport medium  Result Value Ref Range Status   SARS Coronavirus 2 by RT PCR NEGATIVE NEGATIVE Final    Comment: (NOTE) SARS-CoV-2 target nucleic acids are NOT DETECTED.  The SARS-CoV-2 RNA is generally detectable in upper respiratory specimens during the acute phase of infection. The lowest concentration of SARS-CoV-2 viral copies this assay can detect is 138 copies/mL. Wasyl Dornfeld negative result does not preclude SARS-Cov-2 infection and should not be used as the sole basis for treatment or other patient management decisions. Jeanie Mccard negative result may occur with  improper specimen collection/handling, submission of specimen other than nasopharyngeal swab, presence of viral mutation(s) within the areas targeted by this assay, and inadequate number of  viral copies(<138 copies/mL). Branden Shallenberger negative result must be combined with clinical observations, patient history, and epidemiological information. The expected result is  Negative.  Fact Sheet for Patients:  BloggerCourse.com  Fact Sheet for Healthcare Providers:  SeriousBroker.it  This test is no t yet approved or cleared by the [place] FDA and  has been authorized for detection and/or diagnosis of SARS-CoV-2 by FDA under an Emergency Use Authorization (EUA). This EUA will remain  in effect (meaning this test can be used) for the duration of the COVID-19 declaration under Section 564(b)(1) of the Act, 21 U.S.C.section 360bbb-3(b)(1), unless the authorization is terminated  or revoked sooner.       Influenza Leonard Feigel by PCR NEGATIVE NEGATIVE Final   Influenza B by PCR NEGATIVE NEGATIVE Final    Comment: (NOTE) The Xpert Xpress SARS-CoV-2/FLU/RSV plus assay is intended as an aid in the diagnosis of influenza from Nasopharyngeal swab specimens and should not be used as Timaya Bojarski sole basis for treatment. Nasal washings and aspirates are unacceptable for Xpert Xpress SARS-CoV-2/FLU/RSV testing.  Fact Sheet for Patients: BloggerCourse.com  Fact Sheet for Healthcare Providers: SeriousBroker.it  This test is not yet approved or cleared by the [place] FDA and has been authorized for detection and/or diagnosis of SARS-CoV-2 by FDA under an Emergency Use Authorization (EUA). This EUA will remain in effect (meaning this test can be used) for the duration of the COVID-19 declaration under Section 564(b)(1) of the Act, 21 U.S.C. section 360bbb-3(b)(1), unless the authorization is terminated or revoked.  Performed at [facility], [address] [place], [place] [address]   Blood Culture (routine x 2)     Status: None   Collection Time: 05/23/20  6:58 PM   Specimen: BLOOD LEFT HAND  Result Value Ref Range Status   Specimen Description BLOOD LEFT HAND  Final   Special Requests   Final    BOTTLES DRAWN AEROBIC AND ANAEROBIC Blood Culture results may  not be optimal due to an inadequate volume of blood received in culture bottles   Culture   Final    NO GROWTH 5 DAYS Performed at [facility], [address] [place], [place] [address]    Report Status 05/28/2020 FINAL  Final  Urine culture     Status: None   Collection Time: 05/23/20  8:34 PM   Specimen: Urine, Clean Catch  Result Value Ref Range Status   Specimen Description URINE, CLEAN CATCH  Final   Special Requests NONE  Final   Culture   Final    NO GROWTH Performed at [facility], [address] [place], [place] [address]    Report Status 05/24/2020 FINAL  Final  Aerobic/Anaerobic Culture (surgical/deep wound)     Status: None   Collection Time: 05/24/20 12:10 AM   Specimen: PATH Other; Tissue  Result Value Ref Range Status   Specimen Description BUTTOCKS  Final   Special Requests NONE  Final   Gram Stain   Final    RARE WBC PRESENT, PREDOMINANTLY PMN FEW GRAM NEGATIVE RODS RARE GRAM POSITIVE COCCI RARE GRAM POSITIVE RODS    Culture   Final    FEW GARDNERELLA VAGINALIS Standardized susceptibility testing for this organism is not available. FEW PREVOTELLA SPECIES BETA LACTAMASE POSITIVE Performed at [facility], [address] [place], [place] [address]    Report Status 05/31/2020 FINAL  Final  Surgical pcr screen     Status: None   Collection Time: 05/24/20  3:32 AM   Specimen: Nasal Mucosa; Nasal Swab  Result Value Ref Range Status   MRSA, PCR NEGATIVE NEGATIVE Final   Staphylococcus aureus NEGATIVE NEGATIVE Final    Comment: (NOTE) The Xpert SA Assay (FDA approved for NASAL specimens in patients 24 years of age and older), is one component of Refoel Palladino comprehensive surveillance program. It is not intended to diagnose infection nor to guide or monitor treatment. Performed at [facility], [address], [place], [place] [address]   Culture, Urine     Status: Abnormal (Preliminary result)   Collection Time: 06/01/20  5:06 PM    Specimen: Urine, Random  Result Value Ref Range Status   Specimen Description URINE, RANDOM  Final   Special Requests   Final    NONE Performed at [facility], [address], [place], [place] [address]    Culture >=100,000 COLONIES/mL ENTEROCOCCUS FAECIUM (Amarion Portell)  Final   Report Status PENDING  Incomplete         Radiology Studies: VAS [place] UPPER EXTREMITY VENOUS DUPLEX  Result Date: 06/02/2020 UPPER VENOUS STUDY  Indications: Pain Comparison Study: No previous Performing Technologist: Clint Guy RVT  Examination Guidelines: Ziv Welchel complete evaluation includes B-mode imaging, spectral Doppler, color Doppler, and power Doppler as needed of all accessible portions of each vessel. Bilateral testing is considered an integral part of Alantra Popoca complete examination. Limited examinations for reoccurring indications may be performed as noted.  Right Findings: +----------+------------+---------+-----------+----------+-------+ RIGHT     CompressiblePhasicitySpontaneousPropertiesSummary +----------+------------+---------+-----------+----------+-------+ IJV           Full       Yes       Yes                      +----------+------------+---------+-----------+----------+-------+ Subclavian               Yes       Yes                      +----------+------------+---------+-----------+----------+-------+ Axillary      Full       Yes       Yes                      +----------+------------+---------+-----------+----------+-------+ Brachial      Full       Yes       Yes                      +----------+------------+---------+-----------+----------+-------+ Radial        Full                                          +----------+------------+---------+-----------+----------+-------+ Ulnar         Full                                          +----------+------------+---------+-----------+----------+-------+ Cephalic      None                                   Acute   +----------+------------+---------+-----------+----------+-------+ Basilic       Full                                          +----------+------------+---------+-----------+----------+-------+  Left Findings: +----------+------------+---------+-----------+----------+-------+ LEFT      CompressiblePhasicitySpontaneousPropertiesSummary +----------+------------+---------+-----------+----------+-------+ IJV           Full       Yes       Yes                      +----------+------------+---------+-----------+----------+-------+ Subclavian               Yes       Yes                      +----------+------------+---------+-----------+----------+-------+ Axillary      Full       Yes       Yes                      +----------+------------+---------+-----------+----------+-------+ Brachial      Full       Yes       Yes                      +----------+------------+---------+-----------+----------+-------+ Radial        Full                                          +----------+------------+---------+-----------+----------+-------+ Ulnar         Full                                          +----------+------------+---------+-----------+----------+-------+ Cephalic      None                                          +----------+------------+---------+-----------+----------+-------+ Basilic       Full                                          +----------+------------+---------+-----------+----------+-------+  Summary:  Right: No evidence of deep vein thrombosis in the upper extremity. Findings consistent with acute superficial vein thrombosis involving the right cephalic vein.  Left: No evidence of deep vein thrombosis in the upper extremity. Findings consistent with acute superficial vein thrombosis involving the left cephalic vein.  *See table(s) above for measurements and observations.    Preliminary    [place] EKG SITE RITE  Result Date: 06/02/2020 If  Site Rite image not attached, placement could not be confirmed due to current cardiac rhythm.       Scheduled Meds: . acetic acid   Topical TID  . chlorhexidine      . Chlorhexidine Gluconate Cloth  6 each Topical Daily  . donepezil  10 mg Oral Daily  . enoxaparin (LOVENOX) injection  40 mg Subcutaneous Q24H  . escitalopram  20 mg Oral QHS  . famotidine  40 mg Oral QHS  . feeding supplement  237 mL Oral QHS  . feeding supplement (GLUCERNA SHAKE)  237 mL Oral TID BM  . furosemide  20 mg Oral Daily  . insulin aspart  0-15 Units Subcutaneous TID WC & HS  . insulin glargine  22 Units Subcutaneous Daily  .  linezolid  600 mg Oral Q12H  . multivitamin with minerals  1 tablet Oral Daily  . nutrition supplement (JUVEN)  1 packet Oral BID BM  . pantoprazole  40 mg Oral BID  . polycarbophil  625 mg Oral Daily  . pregabalin  100 mg Oral q AM  . pregabalin  200 mg Oral QHS  . rosuvastatin  20 mg Oral QHS  . saccharomyces boulardii  250 mg Oral BID  . sodium hypochlorite   Irrigation TID  . traZODone  150 mg Oral QHS   Continuous Infusions: . piperacillin-tazobactam (ZOSYN)  IV 3.375 g (06/02/20 0654)     LOS: 10 days    Time spent: over 30 min    Lacretia Nicks, MD [facility]   To contact the attending provider between 7A-7P or the covering provider during after hours 7P-7A, please log into the web site [email or web address] and access using universal [place] password for that web site. If you do not have the password, please call the hospital operator.  06/02/2020, 4:43 PM

## 2020-06-02 NOTE — Plan of Care (Signed)
  Problem: Pain Managment: Goal: General experience of comfort will improve Outcome: Progressing   Problem: Safety: Goal: Ability to remain free from injury will improve Outcome: Progressing   Problem: Skin Integrity: Goal: Risk for impaired skin integrity will decrease Outcome: Progressing   

## 2020-06-02 NOTE — Progress Notes (Signed)
Physical Therapy Treatment Patient Details Name: Kristie Tapia MRN: [id number] DOB: 1971/05/02 Today's Date: 06/02/2020    History of Present Illness Patient is a 49 year old female, with a history of CVA, diabetes, fibromyalgia, who comes in secondary to right buttock pain and abdominal pain.   Pt s/p I and D x2 of non viable tissues and drainage of abscess.    PT Comments    Patient with increased buttocks pain this session. Patient requires supervision for sit to stand transfer and min guard for ambulation for safety due to easily distracted. Patient continues to be limited by pain, decreased activity tolerance, impaired balance, and generalized weakness. No PT follow up recommended.     Follow Up Recommendations  No PT follow up     Equipment Recommendations  Rolling Mayan Dolney with 5" wheels    Recommendations for Other Services       Precautions / Restrictions Precautions Precautions: Fall Restrictions Weight Bearing Restrictions: No    Mobility  Bed Mobility Overal bed mobility: Modified Independent                Transfers Overall transfer level: Needs assistance Equipment used: Rolling Baelyn Doring (2 wheeled) Transfers: Sit to/from Stand Sit to Stand: Supervision         General transfer comment: cues for safety  Ambulation/Gait Ambulation/Gait assistance: Min guard Gait Distance (Feet): 100 Feet Assistive device: Rolling Artasia Thang (2 wheeled) Gait Pattern/deviations: Step-through pattern     General Gait Details: min guard for safety due to easily distracted   Stairs             Wheelchair Mobility    Modified Rankin (Stroke Patients Only)       Balance Overall balance assessment: Needs assistance Sitting-balance support: No upper extremity supported;Feet supported Sitting balance-Leahy Scale: Good     Standing balance support: Bilateral upper extremity supported;During functional activity Standing balance-Leahy Scale:  Fair Standing balance comment: reliant on UE support                            Cognition Arousal/Alertness: Awake/alert Behavior During Therapy: WFL for tasks assessed/performed Overall Cognitive Status: Impaired/Different from baseline Area of Impairment: Attention;Safety/judgement;Awareness;Problem solving                   Current Attention Level: Sustained     Safety/Judgement: Decreased awareness of safety Awareness: Emergent Problem Solving: Slow processing        Exercises      General Comments        Pertinent Vitals/Pain Pain Assessment: Faces Faces Pain Scale: Hurts little more Pain Location: buttocks Pain Descriptors / Indicators: Grimacing Pain Intervention(s): Monitored during session    Home Living                      Prior Function            PT Goals (current goals can now be found in the care plan section) Acute Rehab PT Goals Patient Stated Goal: get this pain and swelling down PT Goal Formulation: With patient Time For Goal Achievement: 06/16/20 Potential to Achieve Goals: Good Progress towards PT goals: Progressing toward goals    Frequency    Min 3X/week      PT Plan Current plan remains appropriate    Co-evaluation              AM-PAC PT "6 Clicks" Mobility   Outcome Measure  Help needed  turning from your back to your side while in a flat bed without using bedrails?: None Help needed moving from lying on your back to sitting on the side of a flat bed without using bedrails?: None Help needed moving to and from a bed to a chair (including a wheelchair)?: A Little Help needed standing up from a chair using your arms (e.g., wheelchair or bedside chair)?: A Little Help needed to walk in hospital room?: A Little Help needed climbing 3-5 steps with a railing? : A Little 6 Click Score: 20    End of Session Equipment Utilized During Treatment: Gait belt Activity Tolerance: Patient tolerated  treatment well Patient left: in bed;with call bell/phone within reach Nurse Communication: Mobility status PT Visit Diagnosis: Other abnormalities of gait and mobility (R26.89);Pain;Difficulty in walking, not elsewhere classified (R26.2) Pain - Right/Left: Right Pain - part of body:  (buttock)     Time: 0940-7680 PT Time Calculation (min) (ACUTE ONLY): 13 min  Charges:  $Therapeutic Activity: 8-22 mins                     Gregor Hams, PT, DPT Acute Rehabilitation Services Pager [phone] Office [phone]    [name] 06/02/2020, 1:48 PM

## 2020-06-03 DIAGNOSIS — E119 Type 2 diabetes mellitus without complications: Secondary | ICD-10-CM | POA: Diagnosis not present

## 2020-06-03 DIAGNOSIS — M7989 Other specified soft tissue disorders: Secondary | ICD-10-CM | POA: Diagnosis not present

## 2020-06-03 LAB — BASIC METABOLIC PANEL
Anion gap: 6 (ref 5–15)
BUN: 15 mg/dL (ref 6–20)
CO2: 34 mmol/L — ABNORMAL HIGH (ref 22–32)
Calcium: 7.8 mg/dL — ABNORMAL LOW (ref 8.9–10.3)
Chloride: 95 mmol/L — ABNORMAL LOW (ref 98–111)
Creatinine, Ser: 1.13 mg/dL — ABNORMAL HIGH (ref 0.44–1.00)
GFR, Estimated: 60 mL/min — ABNORMAL LOW (ref >60)
Glucose, Bld: 206 mg/dL — ABNORMAL HIGH (ref 70–99)
Potassium: 4.1 mmol/L (ref 3.5–5.1)
Sodium: 135 mmol/L (ref 135–145)

## 2020-06-03 LAB — URINE CULTURE: Culture: >=100000 — AB

## 2020-06-03 LAB — GLUCOSE, CAPILLARY
Glucose-Capillary: 166 mg/dL — ABNORMAL HIGH (ref 70–99)
Glucose-Capillary: 197 mg/dL — ABNORMAL HIGH (ref 70–99)
Glucose-Capillary: 168 mg/dL — ABNORMAL HIGH (ref 70–99)
Glucose-Capillary: 249 mg/dL — ABNORMAL HIGH (ref 70–99)
Glucose-Capillary: 196 mg/dL — ABNORMAL HIGH (ref 70–99)

## 2020-06-03 LAB — CBC
HCT: 35.5 % — ABNORMAL LOW (ref 36.0–46.0)
Hemoglobin: 11.4 g/dL — ABNORMAL LOW (ref 12.0–15.0)
MCH: 29.5 pg (ref 26.0–34.0)
MCHC: 32.1 g/dL (ref 30.0–36.0)
MCV: 91.7 fL (ref 80.0–100.0)
Platelets: 472 10*3/uL — ABNORMAL HIGH (ref 150–400)
RBC: 3.87 MIL/uL (ref 3.87–5.11)
RDW: 15.7 % — ABNORMAL HIGH (ref 11.5–15.5)
WBC: 20.3 10*3/uL — ABNORMAL HIGH (ref 4.0–10.5)
nRBC: 0 % (ref 0.0–0.2)

## 2020-06-03 LAB — HEPATITIS C ANTIBODY: HCV Ab: NONREACTIVE

## 2020-06-03 NOTE — Progress Notes (Signed)
INFECTIOUS DISEASE PROGRESS NOTE  ID: Kristie Tapia is a 49 y.o. female with  Principal Problem:   Necrotizing soft tissue infection Active Problems:   Dyslipidemia   Diabetes (HCC)   Smoker unmotivated to quit   CVA (cerebral vascular accident) (HCC)   Obesity   Chronic musculoskeletal pain  Subjective: No complaints.   Abtx:  Anti-infectives (From admission, onward)   Start     Dose/Rate Route Frequency Ordered Stop   06/02/20 1545  linezolid (ZYVOX) tablet 600 mg        600 mg Oral Every 12 hours 06/02/20 1452     06/01/20 1400  piperacillin-tazobactam (ZOSYN) IVPB 3.375 g        3.375 g 12.5 mL/hr over 240 Minutes Intravenous Every 8 hours 06/01/20 0945     05/26/20 1400  metroNIDAZOLE (FLAGYL) tablet 500 mg  Status:  Discontinued        500 mg Oral Every 8 hours 05/26/20 0853 06/01/20 0939   05/25/20 2200  vancomycin (VANCOCIN) IVPB 1000 mg/200 mL premix  Status:  Discontinued        1,000 mg 200 mL/hr over 60 Minutes Intravenous Every 12 hours 05/25/20 0918 05/30/20 1129   05/24/20 1800  vancomycin (VANCOREADY) IVPB 750 mg/150 mL  Status:  Discontinued        750 mg 150 mL/hr over 60 Minutes Intravenous Every 24 hours 05/23/20 1914 05/24/20 0811   05/24/20 1400  ceFEPIme (MAXIPIME) 2 g in sodium chloride 0.9 % 100 mL IVPB  Status:  Discontinued        2 g 200 mL/hr over 30 Minutes Intravenous Every 8 hours 05/24/20 0811 06/01/20 0939   05/24/20 0900  vancomycin (VANCOCIN) IVPB 1000 mg/200 mL premix  Status:  Discontinued        1,000 mg 200 mL/hr over 60 Minutes Intravenous Every 12 hours 05/24/20 0811 05/25/20 0918   05/24/20 0800  ceFEPIme (MAXIPIME) 2 g in sodium chloride 0.9 % 100 mL IVPB  Status:  Discontinued        2 g 200 mL/hr over 30 Minutes Intravenous Every 12 hours 05/23/20 1914 05/24/20 0811   05/23/20 2200  clindamycin (CLEOCIN) IVPB 600 mg  Status:  Discontinued        600 mg 100 mL/hr over 30 Minutes Intravenous Every 8 hours 05/23/20 2129  05/26/20 0853   05/23/20 1830  vancomycin (VANCOREADY) IVPB 1500 mg/300 mL        1,500 mg 150 mL/hr over 120 Minutes Intravenous  Once 05/23/20 1813 05/23/20 2229   05/23/20 1800  ceFEPIme (MAXIPIME) 2 g in sodium chloride 0.9 % 100 mL IVPB        2 g 200 mL/hr over 30 Minutes Intravenous  Once 05/23/20 1750 05/23/20 1934   05/23/20 1800  metroNIDAZOLE (FLAGYL) IVPB 500 mg        500 mg 100 mL/hr over 60 Minutes Intravenous  Once 05/23/20 1750 05/23/20 2027   05/23/20 1800  vancomycin (VANCOCIN) IVPB 1000 mg/200 mL premix  Status:  Discontinued        1,000 mg 200 mL/hr over 60 Minutes Intravenous  Once 05/23/20 1750 05/23/20 1813      Medications:  Scheduled: . acetic acid   Topical TID  . Chlorhexidine Gluconate Cloth  6 each Topical Daily  . donepezil  10 mg Oral Daily  . enoxaparin (LOVENOX) injection  40 mg Subcutaneous Q24H  . escitalopram  20 mg Oral QHS  . famotidine  40 mg Oral  QHS  . feeding supplement  237 mL Oral QHS  . feeding supplement (GLUCERNA SHAKE)  237 mL Oral TID BM  . furosemide  40 mg Oral Daily  . insulin aspart  0-15 Units Subcutaneous TID WC & HS  . insulin aspart  3 Units Subcutaneous TID WC  . insulin glargine  22 Units Subcutaneous Daily  . linezolid  600 mg Oral Q12H  . multivitamin with minerals  1 tablet Oral Daily  . nutrition supplement (JUVEN)  1 packet Oral BID BM  . pantoprazole  40 mg Oral BID  . polycarbophil  625 mg Oral Daily  . pregabalin  100 mg Oral q AM  . pregabalin  200 mg Oral QHS  . rosuvastatin  20 mg Oral QHS  . saccharomyces boulardii  250 mg Oral BID  . sodium chloride flush  10-40 mL Intracatheter Q12H  . sodium hypochlorite   Irrigation TID  . traZODone  150 mg Oral QHS    Objective: Vital signs in last 24 hours: Temp:  [98.1 F (36.7 C)-99.2 F (37.3 C)] 98.2 F (36.8 C) (12/31 0809) Pulse Rate:  [69-76] 72 (12/31 0809) Resp:  [17-18] 17 (12/31 0809) BP: (115-121)/(53-59) 121/59 (12/31 0809) SpO2:  [88 %-95  %] 94 % (12/31 0815)   General appearance: alert, cooperative, no distress and morbidly obese Resp: clear to auscultation bilaterally Cardio: regular rate and rhythm GI: normal findings: bowel sounds normal and soft, non-tender  Lab Results Recent Labs    06/02/20 0220 06/03/20 0203  WBC 20.2* 20.3*  HGB 11.0* 11.4*  HCT 32.9* 35.5*  NA 137 135  K 3.3* 4.1  CL 95* 95*  CO2 34* 34*  BUN 14 15  CREATININE 1.06* 1.13*   Liver Panel No results for input(s): PROT, ALBUMIN, AST, ALT, ALKPHOS, BILITOT, BILIDIR, IBILI in the last 72 hours. Sedimentation Rate No results for input(s): ESRSEDRATE in the last 72 hours. C-Reactive Protein No results for input(s): CRP in the last 72 hours.  Microbiology: Recent Results (from the past 240 hour(s))  Culture, Urine     Status: Abnormal   Collection Time: 06/01/20  5:06 PM   Specimen: Urine, Random  Result Value Ref Range Status   Specimen Description URINE, RANDOM  Final   Special Requests   Final    NONE Performed at [facility], [address], [place], [place] [address]    Culture (A)  Final    >=100,000 COLONIES/mL VANCOMYCIN RESISTANT ENTEROCOCCUS   Report Status 06/03/2020 FINAL  Final   Organism ID, Bacteria VANCOMYCIN RESISTANT ENTEROCOCCUS (A)  Final      Susceptibility   Vancomycin resistant enterococcus - MIC*    AMPICILLIN >=32 RESISTANT Resistant     NITROFURANTOIN 32 SENSITIVE Sensitive     VANCOMYCIN >=32 RESISTANT Resistant     LINEZOLID 2 SENSITIVE Sensitive     * >=100,000 COLONIES/mL VANCOMYCIN RESISTANT ENTEROCOCCUS    Studies/Results: VAS [place] UPPER EXTREMITY VENOUS DUPLEX  Result Date: 06/02/2020 UPPER VENOUS STUDY  Indications: Pain Comparison Study: No previous Performing Technologist: Clint Guy RVT  Examination Guidelines: A complete evaluation includes B-mode imaging, spectral Doppler, color Doppler, and power Doppler as needed of all accessible portions of each vessel. Bilateral testing is  considered an integral part of a complete examination. Limited examinations for reoccurring indications may be performed as noted.  Right Findings: +----------+------------+---------+-----------+----------+-------+ RIGHT     CompressiblePhasicitySpontaneousPropertiesSummary +----------+------------+---------+-----------+----------+-------+ IJV           Full  Yes       Yes                      +----------+------------+---------+-----------+----------+-------+ Subclavian               Yes       Yes                      +----------+------------+---------+-----------+----------+-------+ Axillary      Full       Yes       Yes                      +----------+------------+---------+-----------+----------+-------+ Brachial      Full       Yes       Yes                      +----------+------------+---------+-----------+----------+-------+ Radial        Full                                          +----------+------------+---------+-----------+----------+-------+ Ulnar         Full                                          +----------+------------+---------+-----------+----------+-------+ Cephalic      None                                   Acute  +----------+------------+---------+-----------+----------+-------+ Basilic       Full                                          +----------+------------+---------+-----------+----------+-------+  Left Findings: +----------+------------+---------+-----------+----------+-------+ LEFT      CompressiblePhasicitySpontaneousPropertiesSummary +----------+------------+---------+-----------+----------+-------+ IJV           Full       Yes       Yes                      +----------+------------+---------+-----------+----------+-------+ Subclavian               Yes       Yes                      +----------+------------+---------+-----------+----------+-------+ Axillary      Full       Yes       Yes                       +----------+------------+---------+-----------+----------+-------+ Brachial      Full       Yes       Yes                      +----------+------------+---------+-----------+----------+-------+ Radial        Full                                          +----------+------------+---------+-----------+----------+-------+  Ulnar         Full                                          +----------+------------+---------+-----------+----------+-------+ Cephalic      None                                          +----------+------------+---------+-----------+----------+-------+ Basilic       Full                                          +----------+------------+---------+-----------+----------+-------+  Summary:  Right: No evidence of deep vein thrombosis in the upper extremity. Findings consistent with acute superficial vein thrombosis involving the right cephalic vein.  Left: No evidence of deep vein thrombosis in the upper extremity. Findings consistent with acute superficial vein thrombosis involving the left cephalic vein.  *See table(s) above for measurements and observations.  Diagnosing physician: Lemar Livings MD Electronically signed by Lemar Livings MD on 06/02/2020 at 8:17:47 PM.    Final    [place] EKG SITE RITE  Result Date: 06/02/2020 If Site Rite image not attached, placement could not be confirmed due to current cardiac rhythm.    Assessment/Plan: Necrotizing soft tissue infection  Gardnerella, Prevotella UCx+   enterococcus faecium/VRE Diabetes Morbid obesity  Total days of antibiotics 10             Zosyn 12/29 >>   Linezolid 12-30 >>             Cefepime 12/20 >> 12/29                    Vancomycin 12/20 >> 12/27             Clindamycin 12/20 >> 12/22              Will set stop date for VRE in urine for 5 days of linezolid.  Her zosyn stop date is unclear- will depend on wound healing. Would aim for at least 21 days of anbx total  (today is day 10).  Adherence to dressing changes as noted by Dr Daphine Deutscher.   Available as needed.     Johny Sax MD, FACP Infectious Diseases (pager) [phone] [email or web address] 06/03/2020, 10:51 AM  LOS: 11 days

## 2020-06-03 NOTE — Progress Notes (Signed)
Patient ID: Kristie Tapia, female   DOB: 08-08-1970, 49 y.o.   MRN: [id number] [facility] Surgery Progress Note:   9 Days Post-Op  Subjective: Mental status is alert and oriented.  Complaints many involving infrequent nursing care and "too busy" to attend to her dressings. Objective: Vital signs in last 24 hours: Temp:  [98.1 F (36.7 C)-99.2 F (37.3 C)] 98.2 F (36.8 C) (12/31 0809) Pulse Rate:  [69-76] 72 (12/31 0809) Resp:  [17-18] 17 (12/31 0809) BP: (115-121)/(53-59) 121/59 (12/31 0809) SpO2:  [88 %-95 %] 88 % (12/31 0809)  Intake/Output from previous day: No intake/output data recorded. Intake/Output this shift: No intake/output data recorded.  Physical Exam: Work of breathing is normal;  The wound had been repacked with some artificial fiber and not Kerlex as ordered.  I repacked it.  Acetic acid solutution was used as the wound was still greenish soupy like yesterday.  The acetic acid bottle had not been opened and the patient may have had only one dressing change since we did this yesterday.  Real Kerlex was used with 5% acetic acids solution-kerlex rung out.    Lab Results:  Results for orders placed or performed during the hospital encounter of 05/23/20 (from the past 48 hour(s))  Glucose, capillary     Status: Abnormal   Collection Time: 06/01/20 11:09 AM  Result Value Ref Range   Glucose-Capillary 184 (H) 70 - 99 mg/dL    Comment: Glucose reference range applies only to samples taken after fasting for at least 8 hours.  Brain natriuretic peptide     Status: Abnormal   Collection Time: 06/01/20  1:53 PM  Result Value Ref Range   B Natriuretic Peptide 315.2 (H) 0.0 - 100.0 pg/mL    Comment: Performed at [facility], [address] [place], [place] [address]  Prealbumin     Status: Abnormal   Collection Time: 06/01/20  1:53 PM  Result Value Ref Range   Prealbumin 10.3 (L) 18 - 38 mg/dL    Comment: Performed at [facility], [address]  [place], [place] [address]  TSH     Status: Abnormal   Collection Time: 06/01/20  1:53 PM  Result Value Ref Range   TSH 5.371 (H) 0.350 - 4.500 uIU/mL    Comment: Performed by a 3rd Generation assay with a functional sensitivity of <=0.01 uIU/mL. Performed at [facility], [address], [place], [place] [address]   Urinalysis, Complete w Microscopic     Status: Abnormal   Collection Time: 06/01/20  3:34 PM  Result Value Ref Range   Color, Urine YELLOW YELLOW   APPearance CLEAR CLEAR   Specific Gravity, Urine 1.008 1.005 - 1.030   pH 5.0 5.0 - 8.0   Glucose, UA NEGATIVE NEGATIVE mg/dL   Hgb urine dipstick SMALL (A) NEGATIVE   Bilirubin Urine NEGATIVE NEGATIVE   Ketones, ur NEGATIVE NEGATIVE mg/dL   Protein, ur NEGATIVE NEGATIVE mg/dL   Nitrite NEGATIVE NEGATIVE   Leukocytes,Ua NEGATIVE NEGATIVE   RBC / HPF 0-5 0 - 5 RBC/hpf   WBC, UA 0-5 0 - 5 WBC/hpf   Bacteria, UA RARE (A) NONE SEEN   Squamous Epithelial / LPF 0-5 0 - 5   Mucus PRESENT    Hyaline Casts, UA PRESENT     Comment: Performed at [facility], [address], [place], [place] [address]  Glucose, capillary     Status: Abnormal   Collection Time: 06/01/20  4:17 PM  Result Value Ref Range   Glucose-Capillary 142 (H) 70 - 99 mg/dL    Comment: Glucose reference range applies only to samples taken after fasting for at least 8 hours.  Culture, Urine     Status: Abnormal   Collection Time: 06/01/20  5:06 PM   Specimen: Urine, Random  Result Value Ref Range   Specimen Description URINE, RANDOM    Special Requests      NONE Performed at [facility], [address] [place], [place] [address]    Culture (A)     >=100,000 COLONIES/mL VANCOMYCIN RESISTANT ENTEROCOCCUS   Report Status 06/03/2020 FINAL    Organism ID, Bacteria VANCOMYCIN RESISTANT ENTEROCOCCUS (A)       Susceptibility   Vancomycin resistant enterococcus - MIC*    AMPICILLIN >=32 RESISTANT Resistant     NITROFURANTOIN 32 SENSITIVE Sensitive      VANCOMYCIN >=32 RESISTANT Resistant     LINEZOLID 2 SENSITIVE Sensitive     * >=100,000 COLONIES/mL VANCOMYCIN RESISTANT ENTEROCOCCUS  Glucose, capillary     Status: Abnormal   Collection Time: 06/01/20  7:47 PM  Result Value Ref Range   Glucose-Capillary 213 (H) 70 - 99 mg/dL    Comment: Glucose reference range applies only to samples taken after fasting for at least 8 hours.  Glucose, capillary     Status: Abnormal   Collection Time: 06/01/20 11:52 PM  Result Value Ref Range   Glucose-Capillary 209 (H) 70 - 99 mg/dL    Comment: Glucose reference range applies only to samples taken after fasting for at least 8 hours.  CBC     Status: Abnormal   Collection Time: 06/02/20  2:20 AM  Result Value Ref Range   WBC 20.2 (H) 4.0 - 10.5 K/uL   RBC 3.65 (L) 3.87 - 5.11 MIL/uL   Hemoglobin 11.0 (L) 12.0 - 15.0 g/dL   HCT 19.132.9 (L) 47.836.0 - 29.546.0 %   MCV 90.1 80.0 - 100.0 fL   MCH 30.1 26.0 - 34.0 pg   MCHC 33.4 30.0 - 36.0 g/dL   RDW 62.115.8 (H) 30.811.5 - 65.715.5 %   Platelets 433 (H) 150 - 400 K/uL   nRBC 0.0 0.0 - 0.2 %    Comment: Performed at [facility], [address] [place], [place] [address]  Basic metabolic panel     Status: Abnormal   Collection Time: 06/02/20  2:20 AM  Result Value Ref Range   Sodium 137 135 - 145 mmol/L   Potassium 3.3 (L) 3.5 - 5.1 mmol/L   Chloride 95 (L) 98 - 111 mmol/L   CO2 34 (H) 22 - 32 mmol/L   Glucose, Bld 177 (H) 70 - 99 mg/dL    Comment: Glucose reference range applies only to samples taken after fasting for at least 8 hours.   BUN 14 6 - 20 mg/dL   Creatinine, Ser 2.951.06 (H) 0.44 - 1.00 mg/dL   Calcium 7.6 (L) 8.9 - 10.3 mg/dL   GFR, Estimated >28>60 >41>60 mL/min    Comment: (NOTE) Calculated using the CKD-EPI Creatinine Equation (2021)    Anion gap 8 5 - 15    Comment: Performed at [facility], [address] [place], [place] [address]  Glucose, capillary     Status: Abnormal   Collection Time: 06/02/20  4:36 AM  Result Value Ref Range    Glucose-Capillary 159 (H) 70 - 99 mg/dL    Comment: Glucose reference range applies  only to samples taken after fasting for at least 8 hours.  Glucose, capillary     Status: Abnormal   Collection Time: 06/02/20  7:31 AM  Result Value Ref Range   Glucose-Capillary 126 (H) 70 - 99 mg/dL    Comment: Glucose reference range applies only to samples taken after fasting for at least 8 hours.  Glucose, capillary     Status: Abnormal   Collection Time: 06/02/20 11:51 AM  Result Value Ref Range   Glucose-Capillary 221 (H) 70 - 99 mg/dL    Comment: Glucose reference range applies only to samples taken after fasting for at least 8 hours.  Glucose, capillary     Status: Abnormal   Collection Time: 06/02/20  5:47 PM  Result Value Ref Range   Glucose-Capillary 299 (H) 70 - 99 mg/dL    Comment: Glucose reference range applies only to samples taken after fasting for at least 8 hours.  Glucose, capillary     Status: Abnormal   Collection Time: 06/02/20  9:55 PM  Result Value Ref Range   Glucose-Capillary 223 (H) 70 - 99 mg/dL    Comment: Glucose reference range applies only to samples taken after fasting for at least 8 hours.  Glucose, capillary     Status: Abnormal   Collection Time: 06/02/20 11:45 PM  Result Value Ref Range   Glucose-Capillary 172 (H) 70 - 99 mg/dL    Comment: Glucose reference range applies only to samples taken after fasting for at least 8 hours.  Hepatitis C antibody     Status: None   Collection Time: 06/03/20  2:03 AM  Result Value Ref Range   HCV Ab NON REACTIVE NON REACTIVE    Comment: (NOTE) Nonreactive HCV antibody screen is consistent with no HCV infections,  unless recent infection is suspected or other evidence exists to indicate HCV infection.  Performed at [facility], [address] [place], [place] [address]   Basic metabolic panel     Status: Abnormal   Collection Time: 06/03/20  2:03 AM  Result Value Ref Range   Sodium 135 135 - 145 mmol/L    Potassium 4.1 3.5 - 5.1 mmol/L   Chloride 95 (L) 98 - 111 mmol/L   CO2 34 (H) 22 - 32 mmol/L   Glucose, Bld 206 (H) 70 - 99 mg/dL    Comment: Glucose reference range applies only to samples taken after fasting for at least 8 hours.   BUN 15 6 - 20 mg/dL   Creatinine, Ser 3.15 (H) 0.44 - 1.00 mg/dL   Calcium 7.8 (L) 8.9 - 10.3 mg/dL   GFR, Estimated 60 (L) >60 mL/min    Comment: (NOTE) Calculated using the CKD-EPI Creatinine Equation (2021)    Anion gap 6 5 - 15    Comment: Performed at [facility], [address], [place], [place] [address]  CBC     Status: Abnormal   Collection Time: 06/03/20  2:03 AM  Result Value Ref Range   WBC 20.3 (H) 4.0 - 10.5 K/uL   RBC 3.87 3.87 - 5.11 MIL/uL   Hemoglobin 11.4 (L) 12.0 - 15.0 g/dL   HCT 07.3 (L) 71.0 - 62.6 %   MCV 91.7 80.0 - 100.0 fL   MCH 29.5 26.0 - 34.0 pg   MCHC 32.1 30.0 - 36.0 g/dL   RDW 94.8 (H) 54.6 - 27.0 %   Platelets 472 (H) 150 - 400 K/uL   nRBC 0.0 0.0 - 0.2 %  Comment: Performed at [facility], [address], [place], [place] [address]  Glucose, capillary     Status: Abnormal   Collection Time: 06/03/20  4:24 AM  Result Value Ref Range   Glucose-Capillary 166 (H) 70 - 99 mg/dL    Comment: Glucose reference range applies only to samples taken after fasting for at least 8 hours.  Glucose, capillary     Status: Abnormal   Collection Time: 06/03/20  8:06 AM  Result Value Ref Range   Glucose-Capillary 197 (H) 70 - 99 mg/dL    Comment: Glucose reference range applies only to samples taken after fasting for at least 8 hours.    Radiology/Results: VAS [place] UPPER EXTREMITY VENOUS DUPLEX  Result Date: 06/02/2020 UPPER VENOUS STUDY  Indications: Pain Comparison Study: No previous Performing Technologist: Clint Guy RVT  Examination Guidelines: A complete evaluation includes B-mode imaging, spectral Doppler, color Doppler, and power Doppler as needed of all accessible portions of each vessel. Bilateral testing  is considered an integral part of a complete examination. Limited examinations for reoccurring indications may be performed as noted.  Right Findings: +----------+------------+---------+-----------+----------+-------+ RIGHT     CompressiblePhasicitySpontaneousPropertiesSummary +----------+------------+---------+-----------+----------+-------+ IJV           Full       Yes       Yes                      +----------+------------+---------+-----------+----------+-------+ Subclavian               Yes       Yes                      +----------+------------+---------+-----------+----------+-------+ Axillary      Full       Yes       Yes                      +----------+------------+---------+-----------+----------+-------+ Brachial      Full       Yes       Yes                      +----------+------------+---------+-----------+----------+-------+ Radial        Full                                          +----------+------------+---------+-----------+----------+-------+ Ulnar         Full                                          +----------+------------+---------+-----------+----------+-------+ Cephalic      None                                   Acute  +----------+------------+---------+-----------+----------+-------+ Basilic       Full                                          +----------+------------+---------+-----------+----------+-------+  Left Findings: +----------+------------+---------+-----------+----------+-------+ LEFT      CompressiblePhasicitySpontaneousPropertiesSummary +----------+------------+---------+-----------+----------+-------+ IJV  Full       Yes       Yes                      +----------+------------+---------+-----------+----------+-------+ Subclavian               Yes       Yes                      +----------+------------+---------+-----------+----------+-------+ Axillary      Full       Yes        Yes                      +----------+------------+---------+-----------+----------+-------+ Brachial      Full       Yes       Yes                      +----------+------------+---------+-----------+----------+-------+ Radial        Full                                          +----------+------------+---------+-----------+----------+-------+ Ulnar         Full                                          +----------+------------+---------+-----------+----------+-------+ Cephalic      None                                          +----------+------------+---------+-----------+----------+-------+ Basilic       Full                                          +----------+------------+---------+-----------+----------+-------+  Summary:  Right: No evidence of deep vein thrombosis in the upper extremity. Findings consistent with acute superficial vein thrombosis involving the right cephalic vein.  Left: No evidence of deep vein thrombosis in the upper extremity. Findings consistent with acute superficial vein thrombosis involving the left cephalic vein.  *See table(s) above for measurements and observations.  Diagnosing physician: Lemar Livings MD Electronically signed by Lemar Livings MD on 06/02/2020 at 8:17:47 PM.    Final    [place] EKG SITE RITE  Result Date: 06/02/2020 If Site Rite image not attached, placement could not be confirmed due to current cardiac rhythm.   Anti-infectives: Anti-infectives (From admission, onward)   Start     Dose/Rate Route Frequency Ordered Stop   06/02/20 1545  linezolid (ZYVOX) tablet 600 mg        600 mg Oral Every 12 hours 06/02/20 1452     06/01/20 1400  piperacillin-tazobactam (ZOSYN) IVPB 3.375 g        3.375 g 12.5 mL/hr over 240 Minutes Intravenous Every 8 hours 06/01/20 0945     05/26/20 1400  metroNIDAZOLE (FLAGYL) tablet 500 mg  Status:  Discontinued        500 mg Oral Every 8 hours 05/26/20 0853 06/01/20 0939   05/25/20 2200   vancomycin (VANCOCIN) IVPB 1000 mg/200 mL premix  Status:  Discontinued        1,000 mg 200 mL/hr over 60 Minutes Intravenous Every 12 hours 05/25/20 0918 05/30/20 1129   05/24/20 1800  vancomycin (VANCOREADY) IVPB 750 mg/150 mL  Status:  Discontinued        750 mg 150 mL/hr over 60 Minutes Intravenous Every 24 hours 05/23/20 1914 05/24/20 0811   05/24/20 1400  ceFEPIme (MAXIPIME) 2 g in sodium chloride 0.9 % 100 mL IVPB  Status:  Discontinued        2 g 200 mL/hr over 30 Minutes Intravenous Every 8 hours 05/24/20 0811 06/01/20 0939   05/24/20 0900  vancomycin (VANCOCIN) IVPB 1000 mg/200 mL premix  Status:  Discontinued        1,000 mg 200 mL/hr over 60 Minutes Intravenous Every 12 hours 05/24/20 0811 05/25/20 0918   05/24/20 0800  ceFEPIme (MAXIPIME) 2 g in sodium chloride 0.9 % 100 mL IVPB  Status:  Discontinued        2 g 200 mL/hr over 30 Minutes Intravenous Every 12 hours 05/23/20 1914 05/24/20 0811   05/23/20 2200  clindamycin (CLEOCIN) IVPB 600 mg  Status:  Discontinued        600 mg 100 mL/hr over 30 Minutes Intravenous Every 8 hours 05/23/20 2129 05/26/20 0853   05/23/20 1830  vancomycin (VANCOREADY) IVPB 1500 mg/300 mL        1,500 mg 150 mL/hr over 120 Minutes Intravenous  Once 05/23/20 1813 05/23/20 2229   05/23/20 1800  ceFEPIme (MAXIPIME) 2 g in sodium chloride 0.9 % 100 mL IVPB        2 g 200 mL/hr over 30 Minutes Intravenous  Once 05/23/20 1750 05/23/20 1934   05/23/20 1800  metroNIDAZOLE (FLAGYL) IVPB 500 mg        500 mg 100 mL/hr over 60 Minutes Intravenous  Once 05/23/20 1750 05/23/20 2027   05/23/20 1800  vancomycin (VANCOCIN) IVPB 1000 mg/200 mL premix  Status:  Discontinued        1,000 mg 200 mL/hr over 60 Minutes Intravenous  Once 05/23/20 1750 05/23/20 1813      Assessment/Plan: Problem List: Patient Active Problem List   Diagnosis Date Noted  . Status post surgery 05/23/2020  . Necrotizing soft tissue infection 05/23/2020  . Lower respiratory  infection 07/06/2018  . Cough 07/06/2018  . Chronic musculoskeletal pain 07/06/2018  . CVA (cerebral vascular accident) (HCC) 12/27/2017  . Tobacco abuse 12/27/2017  . Obesity 12/27/2017  . Ischemic stroke (HCC) 12/27/2017  . Diabetic peripheral neuropathy (HCC) 12/27/2017  . Smoker unmotivated to quit 07/07/2017  . Chronic GERD 07/07/2017  . Chronic anxiety 07/07/2017  . Dyslipidemia 02/24/2017  . Diabetes (HCC) 02/24/2017  . Headache 02/24/2017    Large right perirectal abscess that is being irrigated and packed (does not appear to be happening q shift).   9 Days Post-Op    LOS: 11 days   Matt B. Daphine Deutscher, MD, [facility] [phone] to reach the surgeon on call.    06/03/2020 9:17 AM

## 2020-06-03 NOTE — Progress Notes (Signed)
Physical Therapy Treatment Patient Details Name: Kristie Tapia MRN: [id number] DOB: [birth year] Today's Date: 06/03/2020    History of Present Illness Patient is a 49 year old female, with a history of CVA, diabetes, fibromyalgia, who comes in secondary to right buttock pain and abdominal pain.   Pt s/p I and D x2 of non viable tissues and drainage of abscess.    PT Comments    Patient making progressing towards physical therapy goals. Patient modI for bed mobility and sit to stand transfer with RW. Patient ambulated 250' with supervision and RW. Patient continues to be limited by pain, decreased activity tolerance, impaired balance, generalized weakness.     Follow Up Recommendations  No PT follow up     Equipment Recommendations  Rolling Deboraha Goar with 5" wheels    Recommendations for Other Services       Precautions / Restrictions Precautions Precautions: Fall Restrictions Weight Bearing Restrictions: No    Mobility  Bed Mobility Overal bed mobility: Modified Independent                Transfers Overall transfer level: Modified independent Equipment used: Rolling Jatoya Armbrister (2 wheeled)                Ambulation/Gait Ambulation/Gait assistance: Supervision Gait Distance (Feet): 250 Feet Assistive device: Rolling Ronnie Doo (2 wheeled) Gait Pattern/deviations: Step-through pattern     General Gait Details: supervision for safety   Stairs             Wheelchair Mobility    Modified Rankin (Stroke Patients Only)       Balance Overall balance assessment: Needs assistance Sitting-balance support: No upper extremity supported;Feet supported Sitting balance-Leahy Scale: Good     Standing balance support: Bilateral upper extremity supported;During functional activity Standing balance-Leahy Scale: Fair Standing balance comment: reliant on UE support                            Cognition Arousal/Alertness: Awake/alert Behavior During  Therapy: WFL for tasks assessed/performed Overall Cognitive Status: Within Functional Limits for tasks assessed                                        Exercises      General Comments General comments (skin integrity, edema, etc.): husband present      Pertinent Vitals/Pain Pain Assessment: 0-10 Pain Score: 7  Pain Location: buttocks Pain Descriptors / Indicators: Grimacing Pain Intervention(s): Monitored during session    Home Living                      Prior Function            PT Goals (current goals can now be found in the care plan section) Acute Rehab PT Goals Patient Stated Goal: get this pain and swelling down PT Goal Formulation: With patient Time For Goal Achievement: 06/16/20 Potential to Achieve Goals: Good Progress towards PT goals: Progressing toward goals    Frequency    Min 3X/week      PT Plan Current plan remains appropriate    Co-evaluation              AM-PAC PT "6 Clicks" Mobility   Outcome Measure  Help needed turning from your back to your side while in a flat bed without using bedrails?: None Help needed moving from lying on your  back to sitting on the side of a flat bed without using bedrails?: None Help needed moving to and from a bed to a chair (including a wheelchair)?: A Little Help needed standing up from a chair using your arms (e.g., wheelchair or bedside chair)?: A Little Help needed to walk in hospital room?: A Little Help needed climbing 3-5 steps with a railing? : A Little 6 Click Score: 20    End of Session   Activity Tolerance: Patient tolerated treatment well Patient left: in bed;with call bell/phone within reach Nurse Communication: Mobility status;Other (comment) (need for dressing reinforced) PT Visit Diagnosis: Other abnormalities of gait and mobility (R26.89);Pain;Difficulty in walking, not elsewhere classified (R26.2) Pain - part of body:  (buttock)     Time: 8588-5027 PT  Time Calculation (min) (ACUTE ONLY): 14 min  Charges:  $Therapeutic Activity: 8-22 mins                     Gregor Hams, PT, DPT Acute Rehabilitation Services Pager [phone] Office [phone]    [name] 06/03/2020, 1:18 PM

## 2020-06-03 NOTE — Progress Notes (Signed)
PROGRESS NOTE    Kristie Tapia  ZOX:[id number] DOB: Jul 18, 1970 DOA: 05/23/2020 PCP: System, Provider Not In   Chief Complaint  Patient presents with  . Abdominal Pain    Brief Narrative:  Kristie Tapia an 49 y.o.femalewith pmh uncontrolled diabetes mellitus type 2, CVA, OSA, and chronic pain who was admitted on 12/20 for necrotizing fasciitis of the R buttock.She is awake and alert. She reported that she started with a boil on her buttock and tried to pop it. It spread and she tried to lance it without success. She as having abdominal pain as well and went to urgent care and eventually had a CT showing severe infection and so came in and had debridement.   Assessment & Plan:   Principal Problem:   Necrotizing soft tissue infection Active Problems:   Dyslipidemia   Diabetes (HCC)   Smoker unmotivated to quit   CVA (cerebral vascular accident) (HCC)   Obesity   Chronic musculoskeletal pain   Necrotizing soft tissue infection of buttock  -Status post I&D on 12/20 and 12/22  - wound care per surgery, hydrotherapy -WBC still elevated at 20.3 -Cultures positive for Prevotella (+beta lactamase) and gardnerella vaginalis -Initially started on vancomycin, cefepime, and Flagyl . Antibiotics switched to Zosyn per ID on 12/29.  Recommending at least 21 days abx total. - of note, pt requested transfer 12/30 due to concern about pt care (dressing changes, etc) -> defer to primary team   Diabetes mellitus type 2 (uncontrolled) -Patient admits to poor adherence to diabetic regimen at home -Hemoglobin A1c 8.9 -Hold Actos -Continue Lantus 22 units daily - will add mealtime  -Continue CBGs with meals with moderate SSI -Appreciate diabetic education and adjust regimen as needed   Normocytic anemia: Stable.  Mild downtrend.   -Continue to monitor   Enterococcus UTI:  linezolid x 5 days per ID   Hypoalbuminemia/moderate protein calorie malnutrition: Acute.  Albumin  was noted to be low at 2.7 on 12/20.  Suspect hypoalbuminemia and recent surgical procedures likely leading to third spacing as cause for swelling. -prealbumin 10.3 -Glucerna in between meals -Continue using Lasix as needed to help with fluid   Diastolic dysfunction -Last EF noted to be 55 to 60% with grade 1 diastolic dysfunction in 2019 -Patient with 2+ pitting edema in the lower extremies generalized -Strict intake and output -Daily weights -Check BNP 329 -Increase lasix to 40 mg PO daily, continue to monitor  -Reassess in a.m. and determine if patient needs continued IV diuresis   Superficial Venous Thrombosis  Bilateral upper extremity cephalic vein superficial vein thrombosis    History of CVA -Continue to hold Plavix for now and resume when medically appropriate   Depression and anxiety -Continue current regimen   Early onset dementia -Continue Aricept   GERD  PPI BID   PICC in place -2/2 poor access, follow   DVT prophylaxis: lovenox Code Status: full  Family Communication: mother at bedside Disposition:   Status is: Inpatient  Remains inpatient appropriate because:Inpatient level of care appropriate due to severity of illness   Dispo: The patient is from: Home              Anticipated d/c is to: pending              Anticipated d/c date is: > 3 days              Patient currently is not medically stable to d/c.   Consultants:   [facility] consulting  ID  General surgery is primary  Procedures:  I&D x 2  Antimicrobials:  Anti-infectives (From admission, onward)   Start     Dose/Rate Route Frequency Ordered Stop   06/02/20 1545  linezolid (ZYVOX) tablet 600 mg        600 mg Oral Every 12 hours 06/02/20 1452 06/06/20 0959   06/01/20 1400  piperacillin-tazobactam (ZOSYN) IVPB 3.375 g        3.375 g 12.5 mL/hr over 240 Minutes Intravenous Every 8 hours 06/01/20 0945     05/26/20 1400  metroNIDAZOLE (FLAGYL) tablet 500 mg  Status:   Discontinued        500 mg Oral Every 8 hours 05/26/20 0853 06/01/20 0939   05/25/20 2200  vancomycin (VANCOCIN) IVPB 1000 mg/200 mL premix  Status:  Discontinued        1,000 mg 200 mL/hr over 60 Minutes Intravenous Every 12 hours 05/25/20 0918 05/30/20 1129   05/24/20 1800  vancomycin (VANCOREADY) IVPB 750 mg/150 mL  Status:  Discontinued        750 mg 150 mL/hr over 60 Minutes Intravenous Every 24 hours 05/23/20 1914 05/24/20 0811   05/24/20 1400  ceFEPIme (MAXIPIME) 2 g in sodium chloride 0.9 % 100 mL IVPB  Status:  Discontinued        2 g 200 mL/hr over 30 Minutes Intravenous Every 8 hours 05/24/20 0811 06/01/20 0939   05/24/20 0900  vancomycin (VANCOCIN) IVPB 1000 mg/200 mL premix  Status:  Discontinued        1,000 mg 200 mL/hr over 60 Minutes Intravenous Every 12 hours 05/24/20 0811 05/25/20 0918   05/24/20 0800  ceFEPIme (MAXIPIME) 2 g in sodium chloride 0.9 % 100 mL IVPB  Status:  Discontinued        2 g 200 mL/hr over 30 Minutes Intravenous Every 12 hours 05/23/20 1914 05/24/20 0811   05/23/20 2200  clindamycin (CLEOCIN) IVPB 600 mg  Status:  Discontinued        600 mg 100 mL/hr over 30 Minutes Intravenous Every 8 hours 05/23/20 2129 05/26/20 0853   05/23/20 1830  vancomycin (VANCOREADY) IVPB 1500 mg/300 mL        1,500 mg 150 mL/hr over 120 Minutes Intravenous  Once 05/23/20 1813 05/23/20 2229   05/23/20 1800  ceFEPIme (MAXIPIME) 2 g in sodium chloride 0.9 % 100 mL IVPB        2 g 200 mL/hr over 30 Minutes Intravenous  Once 05/23/20 1750 05/23/20 1934   05/23/20 1800  metroNIDAZOLE (FLAGYL) IVPB 500 mg        500 mg 100 mL/hr over 60 Minutes Intravenous  Once 05/23/20 1750 05/23/20 2027   05/23/20 1800  vancomycin (VANCOCIN) IVPB 1000 mg/200 mL premix  Status:  Discontinued        1,000 mg 200 mL/hr over 60 Minutes Intravenous  Once 05/23/20 1750 05/23/20 1813      Subjective: Wound was just changed after hydrotherapy No new complaints  Objective: Vitals:    06/02/20 2042 06/03/20 0400 06/03/20 0809 06/03/20 0815  BP: (!) 119/57 (!) 115/53 (!) 121/59   Pulse: 76 69 72   Resp: 18 17 17    Temp: 98.4 F (36.9 C) 98.1 F (36.7 C) 98.2 F (36.8 C)   TempSrc: Oral Oral Oral   SpO2: 95%  (!) 88% 94%  Weight:      Height:       No intake or output data in the 24 hours ending 06/03/20 1517 06/05/20  05/23/20 1542 05/24/20 0200  Weight: 87.1 kg 92.1 kg    Examination:  General: No acute distress. Cardiovascular: Heart sounds show a regular rate, and rhythm.  Lungs: Clear to auscultation bilaterally Abdomen: Soft, nontender, nondistended  Neurological: Alert and oriented 3. Moves all extremities 4. Cranial nerves II through XII grossly intact. Skin: Warm and dry. No rashes or lesions. Extremities: mild LE edema     Data Reviewed: I have personally reviewed following labs and imaging studies  CBC: Recent Labs  Lab 05/30/20 0236 05/31/20 0125 06/01/20 0141 06/02/20 0220 06/03/20 0203  WBC 21.6* 22.5* 21.8* 20.2* 20.3*  HGB 11.9* 11.7* 11.6* 11.0* 11.4*  HCT 33.7* 35.4* 34.7* 32.9* 35.5*  MCV 88.2 89.2 89.2 90.1 91.7  PLT 403* 438* 438* 433* 472*    Basic Metabolic Panel: Recent Labs  Lab 05/30/20 0236 05/31/20 0125 06/01/20 0141 06/02/20 0220 06/03/20 0203  NA 141 139 138 137 135  K 3.1* 3.2* 3.6 3.3* 4.1  CL 107 104 102 95* 95*  CO2 26 27 28  34* 34*  GLUCOSE 193* 136* 160* 177* 206*  BUN 13 9 7 14 15   CREATININE 1.06* 0.91 0.88 1.06* 1.13*  CALCIUM 7.8* 7.7* 7.6* 7.6* 7.8*  MG 1.9  --   --   --   --     GFR: Estimated Creatinine Clearance: 67.5 mL/min (A) (by C-G formula based on SCr of 1.13 mg/dL (H)).  Liver Function Tests: No results for input(s): AST, ALT, ALKPHOS, BILITOT, PROT, ALBUMIN in the last 168 hours.  CBG: Recent Labs  Lab 06/02/20 2155 06/02/20 2345 06/03/20 0424 06/03/20 0806 06/03/20 1201  GLUCAP 223* 172* 166* 197* 168*     Recent Results (from the past 240 hour(s))   Culture, Urine     Status: Abnormal   Collection Time: 06/01/20  5:06 PM   Specimen: Urine, Random  Result Value Ref Range Status   Specimen Description URINE, RANDOM  Final   Special Requests   Final    NONE Performed at [facility], [address] [place], [address] [place]    Culture (A)  Final    >=100,000 COLONIES/mL VANCOMYCIN RESISTANT ENTEROCOCCUS   Report Status 06/03/2020 FINAL  Final   Organism ID, Bacteria VANCOMYCIN RESISTANT ENTEROCOCCUS (A)  Final      Susceptibility   Vancomycin resistant enterococcus - MIC*    AMPICILLIN >=32 RESISTANT Resistant     NITROFURANTOIN 32 SENSITIVE Sensitive     VANCOMYCIN >=32 RESISTANT Resistant     LINEZOLID 2 SENSITIVE Sensitive     * >=100,000 COLONIES/mL VANCOMYCIN RESISTANT ENTEROCOCCUS         Radiology Studies: VAS [id number] UPPER EXTREMITY VENOUS DUPLEX  Result Date: 06/02/2020 UPPER VENOUS STUDY  Indications: Pain Comparison Study: No previous Performing Technologist: [place] RVT  Examination Guidelines: A complete evaluation includes B-mode imaging, spectral Doppler, color Doppler, and power Doppler as needed of all accessible portions of each vessel. Bilateral testing is considered an integral part of a complete examination. Limited examinations for reoccurring indications may be performed as noted.  Right Findings: +----------+------------+---------+-----------+----------+-------+ RIGHT     CompressiblePhasicitySpontaneousPropertiesSummary +----------+------------+---------+-----------+----------+-------+ IJV           Full       Yes       Yes                      +----------+------------+---------+-----------+----------+-------+ Subclavian  Yes       Yes                      +----------+------------+---------+-----------+----------+-------+ Axillary      Full       Yes       Yes                      +----------+------------+---------+-----------+----------+-------+ Brachial       Full       Yes       Yes                      +----------+------------+---------+-----------+----------+-------+ Radial        Full                                          +----------+------------+---------+-----------+----------+-------+ Ulnar         Full                                          +----------+------------+---------+-----------+----------+-------+ Cephalic      None                                   Acute  +----------+------------+---------+-----------+----------+-------+ Basilic       Full                                          +----------+------------+---------+-----------+----------+-------+  Left Findings: +----------+------------+---------+-----------+----------+-------+ LEFT      CompressiblePhasicitySpontaneousPropertiesSummary +----------+------------+---------+-----------+----------+-------+ IJV           Full       Yes       Yes                      +----------+------------+---------+-----------+----------+-------+ Subclavian               Yes       Yes                      +----------+------------+---------+-----------+----------+-------+ Axillary      Full       Yes       Yes                      +----------+------------+---------+-----------+----------+-------+ Brachial      Full       Yes       Yes                      +----------+------------+---------+-----------+----------+-------+ Radial        Full                                          +----------+------------+---------+-----------+----------+-------+ Ulnar         Full                                          +----------+------------+---------+-----------+----------+-------+  Cephalic      None                                          +----------+------------+---------+-----------+----------+-------+ Basilic       Full                                          +----------+------------+---------+-----------+----------+-------+   Summary:  Right: No evidence of deep vein thrombosis in the upper extremity. Findings consistent with acute superficial vein thrombosis involving the right cephalic vein.  Left: No evidence of deep vein thrombosis in the upper extremity. Findings consistent with acute superficial vein thrombosis involving the left cephalic vein.  *See table(s) above for measurements and observations.  Diagnosing physician: Lemar LivingsBrandon Cain MD Electronically signed by Lemar LivingsBrandon Cain MD on 06/02/2020 at 8:17:47 PM.    Final    US EKG SITE RITE  Result Date: 06/02/2020 If Site Rite image not attached, placement could not be confirmed due to current cardiac rhythm.       Scheduled Meds: . acetic acid   Topical TID  . Chlorhexidine Gluconate Cloth  6 each Topical Daily  . donepezil  10 mg Oral Daily  . enoxaparin (LOVENOX) injection  40 mg Subcutaneous Q24H  . escitalopram  20 mg Oral QHS  . famotidine  40 mg Oral QHS  . feeding supplement  237 mL Oral QHS  . feeding supplement (GLUCERNA SHAKE)  237 mL Oral TID BM  . furosemide  40 mg Oral Daily  . insulin aspart  0-15 Units Subcutaneous TID WC & HS  . insulin aspart  3 Units Subcutaneous TID WC  . insulin glargine  22 Units Subcutaneous Daily  . linezolid  600 mg Oral Q12H  . multivitamin with minerals  1 tablet Oral Daily  . nutrition supplement (JUVEN)  1 packet Oral BID BM  . pantoprazole  40 mg Oral BID  . polycarbophil  625 mg Oral Daily  . pregabalin  100 mg Oral q AM  . pregabalin  200 mg Oral QHS  . rosuvastatin  20 mg Oral QHS  . saccharomyces boulardii  250 mg Oral BID  . sodium chloride flush  10-40 mL Intracatheter Q12H  . sodium hypochlorite   Irrigation TID  . traZODone  150 mg Oral QHS   Continuous Infusions: . piperacillin-tazobactam (ZOSYN)  IV 3.375 g (06/03/20 1350)     LOS: 11 days    Time spent: over 30 min    Lacretia Nicksaldwell Powell, MD [facility]   To contact the attending provider between 7A-7P or the covering  provider during after hours 7P-7A, please log into the web site [email or web address] and access using universal [place] password for that web site. If you do not have the password, please call the hospital operator.  06/03/2020, 3:17 PM

## 2020-06-03 NOTE — Progress Notes (Signed)
Physical Therapy Wound Treatment Patient Details  Name: Kristie Tapia MRN: [id number] Date of Birth: 03-May-1971  Today's Date: 06/03/2020 Time: 1005-1022 Time Calculation (min): 17 min  Subjective  Subjective: Agreeable to hydrotherapy - reports she is going for imaging later so surgery can assess whether she needs another I&D. Patient and Family Stated Goals: Avoid another surgery Date of Onset: 05/23/20 Prior Treatments: I&D in OR x 2  Pain Score: Pain Score: 7   Wound Assessment  Wound / Incision (Open or Dehisced) 05/24/20 Incision - Dehisced Buttocks Right (Active)  Wound Image   05/30/20 1511  Dressing Type ABD;Gauze (Comment);Moist to dry 06/03/20 1130  Dressing Changed Changed 06/03/20 1130  Dressing Status Clean;Dry;Intact 06/03/20 1130  Dressing Change Frequency Twice a day 06/03/20 1130  Site / Wound Assessment Granulation tissue;Pink;Yellow;Painful 06/03/20 1130  % Wound base Red or Granulating 40% 06/03/20 1130  % Wound base Yellow/Fibrinous Exudate 55% 06/03/20 1130  % Wound base Black/Eschar 5% 06/03/20 1130  % Wound base Other/Granulation Tissue (Comment) 0% 06/03/20 1130  Peri-wound Assessment Intact 06/03/20 1130  Wound Length (cm) 11.5 cm 05/29/20 0924  Wound Width (cm) 5.8 cm 05/29/20 0924  Wound Depth (cm) 10 cm 05/29/20 0924  Wound Volume (cm^3) 667 cm^3 05/29/20 0924  Wound Surface Area (cm^2) 66.7 cm^2 05/29/20 0924  Tunneling (cm) 8 05/30/20 2245  Margins Attached edges (approximated) 06/03/20 1130  Closure None 06/03/20 1130  Drainage Amount Minimal 06/03/20 1130  Drainage Description Serosanguineous 06/03/20 1130  Treatment Hydrotherapy (Pulse lavage);Debridement (Selective);Packing (Impregnated strip) 06/03/20 1130  Dakin's used in packing  Hydrotherapy Pulsed lavage therapy - wound location: rt buttock Pulsed Lavage with Suction (psi): 12 psi Pulsed Lavage with Suction - Normal Saline Used: 1000 mL Pulsed Lavage Tip: Tip with splash  shield Selective Debridement Selective Debridement - Location: rt buttock Selective Debridement - Tools Used: Scalpel;Scissors Selective Debridement - Tissue Removed: yellow unviable tissue   Wound Assessment and Plan  Wound Therapy - Assess/Plan/Recommendations Wound Therapy - Clinical Statement: Wound continues to progess with increased granulation tissue.  Still with increased pain at base of wound.  Will benefit from continued hydrotherapy and debridement. Wound Therapy - Functional Problem List: Open, painful wound and inadvisable to sit for prolonged periods due to wound location Factors Delaying/Impairing Wound Healing: Infection - systemic/local;Diabetes Mellitus Hydrotherapy Plan: Debridement;Dressing change;Patient/family education;Pulsatile lavage with suction Wound Therapy - Frequency: 6X / week Wound Therapy - Follow Up Recommendations: Home health RN Wound Plan: see above  Wound Therapy Goals- Improve the function of patient's integumentary system by progressing the wound(s) through the phases of wound healing (inflammation - proliferation - remodeling) by: Decrease Necrotic Tissue to: <40% Decrease Necrotic Tissue - Progress: Progressing toward goal Increase Granulation Tissue to: >40% Increase Granulation Tissue - Progress: Progressing toward goal Decrease Length/Width/Depth by (cm): 0/0/1.0cm Decrease Length/Width/Depth - Progress: Progressing toward goal Improve Drainage Characteristics: Min;Serous Improve Drainage Characteristics - Progress: Progressing toward goal Patient/Family will be able to : verbalize wound dressing application to instruct future caregivers Patient/Family Instruction Goal - Progress: Progressing toward goal  Goals will be updated until maximal potential achieved or discharge criteria met.  Discharge criteria: when goals achieved, discharge from hospital, MD decision/surgical intervention, no progress towards goals, refusal/missing three  consecutive treatments without notification or medical reason.  GP     Shanna Cisco 06/03/2020, 11:39 AM 06/03/2020 Lesleigh Noe, PT Acute Rehabilitation Services Pager:  [phone] Office:  [phone]

## 2020-06-04 DIAGNOSIS — M7989 Other specified soft tissue disorders: Secondary | ICD-10-CM | POA: Diagnosis not present

## 2020-06-04 LAB — COMPREHENSIVE METABOLIC PANEL
ALT: 10 U/L (ref 0–44)
AST: 17 U/L (ref 15–41)
Albumin: 2.2 g/dL — ABNORMAL LOW (ref 3.5–5.0)
Alkaline Phosphatase: 104 U/L (ref 38–126)
Anion gap: 9 (ref 5–15)
BUN: 11 mg/dL (ref 6–20)
CO2: 32 mmol/L (ref 22–32)
Calcium: 8.2 mg/dL — ABNORMAL LOW (ref 8.9–10.3)
Chloride: 97 mmol/L — ABNORMAL LOW (ref 98–111)
Creatinine, Ser: 1.13 mg/dL — ABNORMAL HIGH (ref 0.44–1.00)
GFR, Estimated: 60 mL/min — ABNORMAL LOW (ref >60)
Glucose, Bld: 143 mg/dL — ABNORMAL HIGH (ref 70–99)
Potassium: 4 mmol/L (ref 3.5–5.1)
Sodium: 138 mmol/L (ref 135–145)
Total Bilirubin: 0.6 mg/dL (ref 0.3–1.2)
Total Protein: 6 g/dL — ABNORMAL LOW (ref 6.5–8.1)

## 2020-06-04 LAB — CBC WITH DIFFERENTIAL/PLATELET
Abs Immature Granulocytes: 0.52 10*3/uL — ABNORMAL HIGH (ref 0.00–0.07)
Basophils Absolute: 0.1 10*3/uL (ref 0.0–0.1)
Basophils Relative: 1 %
Eosinophils Absolute: 0.6 10*3/uL — ABNORMAL HIGH (ref 0.0–0.5)
Eosinophils Relative: 3 %
HCT: 37.2 % (ref 36.0–46.0)
Hemoglobin: 12.1 g/dL (ref 12.0–15.0)
Immature Granulocytes: 3 %
Lymphocytes Relative: 12 %
Lymphs Abs: 2.1 10*3/uL (ref 0.7–4.0)
MCH: 30.1 pg (ref 26.0–34.0)
MCHC: 32.5 g/dL (ref 30.0–36.0)
MCV: 92.5 fL (ref 80.0–100.0)
Monocytes Absolute: 0.9 10*3/uL (ref 0.1–1.0)
Monocytes Relative: 5 %
Neutro Abs: 13.6 10*3/uL — ABNORMAL HIGH (ref 1.7–7.7)
Neutrophils Relative %: 76 %
Platelets: 510 10*3/uL — ABNORMAL HIGH (ref 150–400)
RBC: 4.02 MIL/uL (ref 3.87–5.11)
RDW: 16.1 % — ABNORMAL HIGH (ref 11.5–15.5)
WBC: 17.8 10*3/uL — ABNORMAL HIGH (ref 4.0–10.5)
nRBC: 0 % (ref 0.0–0.2)

## 2020-06-04 LAB — MAGNESIUM: Magnesium: 2.1 mg/dL (ref 1.7–2.4)

## 2020-06-04 LAB — PHOSPHORUS: Phosphorus: 4.4 mg/dL (ref 2.5–4.6)

## 2020-06-04 LAB — GLUCOSE, CAPILLARY
Glucose-Capillary: 136 mg/dL — ABNORMAL HIGH (ref 70–99)
Glucose-Capillary: 145 mg/dL — ABNORMAL HIGH (ref 70–99)
Glucose-Capillary: 156 mg/dL — ABNORMAL HIGH (ref 70–99)
Glucose-Capillary: 157 mg/dL — ABNORMAL HIGH (ref 70–99)
Glucose-Capillary: 226 mg/dL — ABNORMAL HIGH (ref 70–99)
Glucose-Capillary: 257 mg/dL — ABNORMAL HIGH (ref 70–99)

## 2020-06-04 MED ORDER — CLOPIDOGREL BISULFATE 75 MG PO TABS
75.0000 mg | ORAL_TABLET | Freq: Every day | ORAL | Status: DC
  Administered 2020-06-05 – 2020-06-10 (×6): 75 mg via ORAL
  Filled 2020-06-04 (×6): qty 1

## 2020-06-04 NOTE — Progress Notes (Signed)
10 Days Post-Op    CC: Buttocks pain  Subjective: Patient sitting up in the chair complains of pain which is continuous.  She is in no acute distress.  Ambulates quite easily.  Objective: Vital signs in last 24 hours: Temp:  [97.5 F (36.4 C)-99.1 F (37.3 C)] 97.5 F (36.4 C) (01/01 0756) Pulse Rate:  [55-67] 67 (01/01 0756) Resp:  [15-17] 15 (01/01 0756) BP: (104-115)/(52-57) 115/57 (01/01 0756) SpO2:  [91 %-95 %] 93 % (01/01 0756) Last BM Date: 06/03/20 P.o. not recorded 750 urine recorded BM times 1 Afebrile vital signs are stable Creatinine 1.13 Glucose 249-143 WBC 17.8 Specimen Description URINE, RANDOM   Special Requests NONE  Performed at [facility], [address], [place], [place] [address]   Culture >=100,000 COLONIES/mL VANCOMYCIN RESISTANT ENTEROCOCCUSAbnormal   Report Status 06/03/2020 FINAL   Organism ID, Bacteria VANCOMYCIN RESISTANT ENTEROCOCCUSAbnormal     Intake/Output from previous day: 12/31 0701 - 01/01 0700 In: -  Out: 750 [Urine:750] Intake/Output this shift: Total I/O In: 240 [P.O.:240] Out: -   General appearance: alert, cooperative and no distress Resp: clear to auscultation bilaterally GI: soft, non-tender; bowel sounds normal; no masses,  no organomegaly Skin: See picture below    Lab Results:  Recent Labs    06/03/20 0203 06/04/20 0345  WBC 20.3* 17.8*  HGB 11.4* 12.1  HCT 35.5* 37.2  PLT 472* 510*    BMET Recent Labs    06/03/20 0203 06/04/20 0345  NA 135 138  K 4.1 4.0  CL 95* 97*  CO2 34* 32  GLUCOSE 206* 143*  BUN 15 11  CREATININE 1.13* 1.13*  CALCIUM 7.8* 8.2*   PT/INR No results for input(s): LABPROT, INR in the last 72 hours.  Recent Labs  Lab 06/04/20 0345  AST 17  ALT 10  ALKPHOS 104  BILITOT 0.6  PROT 6.0*  ALBUMIN 2.2*     Lipase     Component Value Date/Time   LIPASE 17 05/23/2020 1549     Medications: . acetic acid   Topical TID  . Chlorhexidine Gluconate Cloth  6  each Topical Daily  . donepezil  10 mg Oral Daily  . enoxaparin (LOVENOX) injection  40 mg Subcutaneous Q24H  . escitalopram  20 mg Oral QHS  . famotidine  40 mg Oral QHS  . feeding supplement  237 mL Oral QHS  . feeding supplement (GLUCERNA SHAKE)  237 mL Oral TID BM  . furosemide  40 mg Oral Daily  . insulin aspart  0-15 Units Subcutaneous TID WC & HS  . insulin aspart  3 Units Subcutaneous TID WC  . insulin glargine  22 Units Subcutaneous Daily  . linezolid  600 mg Oral Q12H  . multivitamin with minerals  1 tablet Oral Daily  . nutrition supplement (JUVEN)  1 packet Oral BID BM  . pantoprazole  40 mg Oral BID  . polycarbophil  625 mg Oral Daily  . pregabalin  100 mg Oral q AM  . pregabalin  200 mg Oral QHS  . rosuvastatin  20 mg Oral QHS  . saccharomyces boulardii  250 mg Oral BID  . sodium chloride flush  10-40 mL Intracatheter Q12H  . sodium hypochlorite   Irrigation TID  . traZODone  150 mg Oral QHS   Antibiotics Given (last 72 hours)    Date/Time Action Medication Dose Rate   06/01/20 1253 New Bag/Given   piperacillin-tazobactam (ZOSYN) IVPB 3.375 g 3.375 g 12.5 mL/hr  06/01/20 2238 New Bag/Given   piperacillin-tazobactam (ZOSYN) IVPB 3.375 g 3.375 g 12.5 mL/hr   06/02/20 0654 New Bag/Given   piperacillin-tazobactam (ZOSYN) IVPB 3.375 g 3.375 g 12.5 mL/hr   06/02/20 1526 Given   linezolid (ZYVOX) tablet 600 mg 600 mg    06/02/20 1700 New Bag/Given   piperacillin-tazobactam (ZOSYN) IVPB 3.375 g 3.375 g 12.5 mL/hr   06/02/20 2146 New Bag/Given   piperacillin-tazobactam (ZOSYN) IVPB 3.375 g 3.375 g 12.5 mL/hr   06/02/20 2149 Given   linezolid (ZYVOX) tablet 600 mg 600 mg    06/03/20 1540 New Bag/Given   piperacillin-tazobactam (ZOSYN) IVPB 3.375 g 3.375 g 12.5 mL/hr   06/03/20 0867 Given   linezolid (ZYVOX) tablet 600 mg 600 mg    06/03/20 1350 New Bag/Given   piperacillin-tazobactam (ZOSYN) IVPB 3.375 g 3.375 g 12.5 mL/hr   06/03/20 2121 Given   linezolid (ZYVOX)  tablet 600 mg 600 mg    06/03/20 2124 New Bag/Given   piperacillin-tazobactam (ZOSYN) IVPB 3.375 g 3.375 g 12.5 mL/hr   06/04/20 6195 New Bag/Given   piperacillin-tazobactam (ZOSYN) IVPB 3.375 g 3.375 g 12.5 mL/hr   06/04/20 0932 Given   linezolid (ZYVOX) tablet 600 mg 600 mg       Assessment/Plan Necrotizing soft tissue infection of the buttock and retroperitoneal abscess - S/P debridement by Dr. Derrell Lolling 12/20&12/22.   -  -WBC 21.8>> 20.2>> 20.3>> 17.8  - Cont BID dressing changeswithDakins. Hydrotherapy.  - Mobilize  Acute hypoxic ventilator dependent respiratory failure-resolved.On RA. Some sob. No chest pain. EKG and CXR. Cont home lasix. Hx DM2 -A1c 8.9.SSI. Persistent hyperglycemia. Diabetic coordinator consult. Changes made per their recs. Hx CVA- on Plavix at home.Was on hold in case she needed further OR, restarted by [facility] 12/26. Stopped again 12/27.  HLD - crestor Hx of gastric ulcer - Pepcid. Avoid nsaids Mult psych meds - home meds ABL Anemia - hgb stable Obesity BMI 33.79 FEN-CM diet.Replace K ID-cultureswithGardnerellaVaginalis. Report pending. Continue Cleocin 12/23 x 1; vancomycin 12/23 -12/29;  cefepime 12/23-12/29; Flagyl 12/23-12/29;   linezolid 12/30>> day 3; Zosyn 12/29 >> day 4 Foley-Out. Voiding VTE-SCDs,Lovenox Dispo- Wet to dry dressing  Plan: Continue wet-to-dry dressings twice daily, use panties to hold the dressing in place no tape.  Per GI Dr. Roanna Banning assistance with her multiple medical issues.  She says her husband does not have the stomach to do the dressing changes.  We will need to begin working on home health on Monday.      LOS: 12 days    Geraldin Habermehl 06/04/2020 Please see Amion

## 2020-06-04 NOTE — Progress Notes (Signed)
PROGRESS NOTE    Kristie Tapia  VQM:[id number] DOB: August 23, 1970 DOA: 05/23/2020 PCP: System, Provider Not In   Chief Complaint  Patient presents with  . Abdominal Pain    Brief Narrative:  Kristie Lowdermilkis an 50 y.o.femalewith pmh uncontrolled diabetes mellitus type 2, CVA, OSA, and chronic pain who was admitted on 12/20 for necrotizing fasciitis of the R buttock.She is awake and alert. She reported that she started with Kristie Tapia boil on her buttock and tried to pop it. It spread and she tried to lance it without success. She as having abdominal pain as well and went to urgent care and eventually had Kristie Tapia CT showing severe infection and so came in and had debridement.   Assessment & Plan:   Principal Problem:   Necrotizing soft tissue infection Active Problems:   Dyslipidemia   Diabetes (HCC)   Smoker unmotivated to quit   CVA (cerebral vascular accident) (HCC)   Obesity   Chronic musculoskeletal pain   Necrotizing soft tissue infection of buttock  -Status post I&D on 12/20 and 12/22  - wound care per surgery, hydrotherapy -WBC still elevated at 17.8 -Cultures positive for Prevotella (+beta lactamase) and gardnerella vaginalis -Initially started on vancomycin, cefepime, and Flagyl . Antibiotics switched to Zosyn per ID on 12/29.  Recommending at least 21 days abx total.   Diabetes mellitus type 2 (uncontrolled) -Patient admits to poor adherence to diabetic regimen at home -Hemoglobin A1c 8.9 -Hold Actos -Continue Lantus 22 units daily - will add mealtime  -Continue CBGs with meals with moderate SSI -Appreciate diabetic education and adjust regimen as needed   Normocytic anemia: Stable.  Mild downtrend.   -Continue to monitor   Enterococcus UTI:  linezolid x 5 days per ID   Hypoalbuminemia/moderate protein calorie malnutrition: Acute.  Albumin was noted to be low at 2.7 on 12/20.  Suspect hypoalbuminemia and recent surgical procedures likely leading to third  spacing as cause for swelling. -prealbumin 10.3 -Glucerna in between meals -Continue using Lasix as needed to help with fluid   Diastolic dysfunction -Last EF noted to be 55 to 60% with grade 1 diastolic dysfunction in 2019 -Patient with 2+ pitting edema in the lower extremies generalized -Strict intake and output -Daily weights -Check BNP 329 -Increase lasix to 40 mg PO daily, continue to monitor  -Reassess in Chasmine Lender.m. and determine if patient needs continued IV diuresis   Superficial Venous Thrombosis  Bilateral upper extremity cephalic vein superficial vein thrombosis    History of CVA -Continue to hold Plavix for now and resume when medically appropriate   Depression and anxiety -Continue current regimen   Early onset dementia -Continue Aricept   GERD  PPI BID   PICC in place -2/2 poor access, follow   DVT prophylaxis: lovenox Code Status: full  Family Communication: mother at bedside Disposition:   Status is: Inpatient  Remains inpatient appropriate because:Inpatient level of care appropriate due to severity of illness   Dispo: The patient is from: Home              Anticipated d/c is to: pending              Anticipated d/c date is: > 3 days              Patient currently is not medically stable to d/c.   Consultants:   [facility] consulting  ID  General surgery is primary  Procedures:  I&D x 2  Antimicrobials:  Anti-infectives (From admission, onward)   Start  Dose/Rate Route Frequency Ordered Stop   06/02/20 1545  linezolid (ZYVOX) tablet 600 mg        600 mg Oral Every 12 hours 06/02/20 1452 06/06/20 0959   06/01/20 1400  piperacillin-tazobactam (ZOSYN) IVPB 3.375 g        3.375 g 12.5 mL/hr over 240 Minutes Intravenous Every 8 hours 06/01/20 0945     05/26/20 1400  metroNIDAZOLE (FLAGYL) tablet 500 mg  Status:  Discontinued        500 mg Oral Every 8 hours 05/26/20 0853 06/01/20 0939   05/25/20 2200  vancomycin (VANCOCIN) IVPB 1000  mg/200 mL premix  Status:  Discontinued        1,000 mg 200 mL/hr over 60 Minutes Intravenous Every 12 hours 05/25/20 0918 05/30/20 1129   05/24/20 1800  vancomycin (VANCOREADY) IVPB 750 mg/150 mL  Status:  Discontinued        750 mg 150 mL/hr over 60 Minutes Intravenous Every 24 hours 05/23/20 1914 05/24/20 0811   05/24/20 1400  ceFEPIme (MAXIPIME) 2 g in sodium chloride 0.9 % 100 mL IVPB  Status:  Discontinued        2 g 200 mL/hr over 30 Minutes Intravenous Every 8 hours 05/24/20 0811 06/01/20 0939   05/24/20 0900  vancomycin (VANCOCIN) IVPB 1000 mg/200 mL premix  Status:  Discontinued        1,000 mg 200 mL/hr over 60 Minutes Intravenous Every 12 hours 05/24/20 0811 05/25/20 0918   05/24/20 0800  ceFEPIme (MAXIPIME) 2 g in sodium chloride 0.9 % 100 mL IVPB  Status:  Discontinued        2 g 200 mL/hr over 30 Minutes Intravenous Every 12 hours 05/23/20 1914 05/24/20 0811   05/23/20 2200  clindamycin (CLEOCIN) IVPB 600 mg  Status:  Discontinued        600 mg 100 mL/hr over 30 Minutes Intravenous Every 8 hours 05/23/20 2129 05/26/20 0853   05/23/20 1830  vancomycin (VANCOREADY) IVPB 1500 mg/300 mL        1,500 mg 150 mL/hr over 120 Minutes Intravenous  Once 05/23/20 1813 05/23/20 2229   05/23/20 1800  ceFEPIme (MAXIPIME) 2 g in sodium chloride 0.9 % 100 mL IVPB        2 g 200 mL/hr over 30 Minutes Intravenous  Once 05/23/20 1750 05/23/20 1934   05/23/20 1800  metroNIDAZOLE (FLAGYL) IVPB 500 mg        500 mg 100 mL/hr over 60 Minutes Intravenous  Once 05/23/20 1750 05/23/20 2027   05/23/20 1800  vancomycin (VANCOCIN) IVPB 1000 mg/200 mL premix  Status:  Discontinued        1,000 mg 200 mL/hr over 60 Minutes Intravenous  Once 05/23/20 1750 05/23/20 1813      Subjective: Daughter on phone as we talk  Worried about eventual d/c plan - doesn't want to go to rehab  Objective: Vitals:   06/03/20 1947 06/04/20 0341 06/04/20 0756 06/04/20 1425  BP: (!) 111/57 (!) 104/52 (!) 115/57 (!)  106/57  Pulse: 67 (!) 55 67 70  Resp: 15 15 15 17   Temp: 99.1 F (37.3 C) 98 F (36.7 C) (!) 97.5 F (36.4 C) 98 F (36.7 C)  TempSrc: Oral Oral Oral Oral  SpO2: 95% 91% 93% 94%  Weight:      Height:        Intake/Output Summary (Last 24 hours) at 06/04/2020 1611 Last data filed at 06/04/2020 1300 Gross per 24 hour  Intake 480 ml  Output 754 ml  Net -274 ml   Filed Weights   05/23/20 1542 05/24/20 0200  Weight: 87.1 kg 92.1 kg    Examination:  General: No acute distress. Cardiovascular: Heart sounds show Kristie Tapia regular rate, and rhythm.  Lungs: Clear to auscultation bilaterally  Abdomen: Soft, nontender, nondistended  Neurological: Alert and oriented 3. Moves all extremities 4. Cranial nerves II through XII grossly intact. Skin: extensive gluteal wound (see imaging) Extremities: No clubbing or cyanosis. No edema.      Data Reviewed: I have personally reviewed following labs and imaging studies  CBC: Recent Labs  Lab 05/31/20 0125 06/01/20 0141 06/02/20 0220 06/03/20 0203 06/04/20 0345  WBC 22.5* 21.8* 20.2* 20.3* 17.8*  NEUTROABS  --   --   --   --  13.6*  HGB 11.7* 11.6* 11.0* 11.4* 12.1  HCT 35.4* 34.7* 32.9* 35.5* 37.2  MCV 89.2 89.2 90.1 91.7 92.5  PLT 438* 438* 433* 472* 510*    Basic Metabolic Panel: Recent Labs  Lab 05/30/20 0236 05/31/20 0125 06/01/20 0141 06/02/20 0220 06/03/20 0203 06/04/20 0345  NA 141 139 138 137 135 138  K 3.1* 3.2* 3.6 3.3* 4.1 4.0  CL 107 104 102 95* 95* 97*  CO2 26 27 28  34* 34* 32  GLUCOSE 193* 136* 160* 177* 206* 143*  BUN 13 9 7 14 15 11   CREATININE 1.06* 0.91 0.88 1.06* 1.13* 1.13*  CALCIUM 7.8* 7.7* 7.6* 7.6* 7.8* 8.2*  MG 1.9  --   --   --   --  2.1  PHOS  --   --   --   --   --  4.4    GFR: Estimated Creatinine Clearance: 67.5 mL/min (Kristie Tapia) (by C-G formula based on SCr of 1.13 mg/dL (H)).  Liver Function Tests: Recent Labs  Lab 06/04/20 0345  AST 17  ALT 10  ALKPHOS 104  BILITOT 0.6  PROT 6.0*   ALBUMIN 2.2*    CBG: Recent Labs  Lab 06/03/20 1953 06/04/20 0026 06/04/20 0343 06/04/20 0837 06/04/20 1129  GLUCAP 196* 157* 145* 136* 156*     Recent Results (from the past 240 hour(s))  Culture, Urine     Status: Abnormal   Collection Time: 06/01/20  5:06 PM   Specimen: Urine, Random  Result Value Ref Range Status   Specimen Description URINE, RANDOM  Final   Special Requests   Final    NONE Performed at [facility], [address], [place], [address] [place]    Culture (Kristie Tapia)  Final    >=100,000 COLONIES/mL VANCOMYCIN RESISTANT ENTEROCOCCUS   Report Status 06/03/2020 FINAL  Final   Organism ID, Bacteria VANCOMYCIN RESISTANT ENTEROCOCCUS (Kristie Tapia)  Final      Susceptibility   Vancomycin resistant enterococcus - MIC*    AMPICILLIN >=32 RESISTANT Resistant     NITROFURANTOIN 32 SENSITIVE Sensitive     VANCOMYCIN >=32 RESISTANT Resistant     LINEZOLID 2 SENSITIVE Sensitive     * >=100,000 COLONIES/mL VANCOMYCIN RESISTANT ENTEROCOCCUS         Radiology Studies: No results found.      Scheduled Meds: . acetic acid   Topical TID  . Chlorhexidine Gluconate Cloth  6 each Topical Daily  . clopidogrel  75 mg Oral Daily  . donepezil  10 mg Oral Daily  . enoxaparin (LOVENOX) injection  40 mg Subcutaneous Q24H  . escitalopram  20 mg Oral QHS  . famotidine  40 mg Oral QHS  . feeding supplement  237 mL Oral QHS  . feeding supplement (GLUCERNA SHAKE)  237 mL Oral TID BM  . furosemide  40 mg Oral Daily  . insulin aspart  0-15 Units Subcutaneous TID WC & HS  . insulin aspart  3 Units Subcutaneous TID WC  . insulin glargine  22 Units Subcutaneous Daily  . linezolid  600 mg Oral Q12H  . multivitamin with minerals  1 tablet Oral Daily  . nutrition supplement (JUVEN)  1 packet Oral BID BM  . pantoprazole  40 mg Oral BID  . polycarbophil  625 mg Oral Daily  . pregabalin  100 mg Oral q AM  . pregabalin  200 mg Oral QHS  . rosuvastatin  20 mg Oral QHS  . saccharomyces  boulardii  250 mg Oral BID  . sodium chloride flush  10-40 mL Intracatheter Q12H  . sodium hypochlorite   Irrigation TID  . traZODone  150 mg Oral QHS   Continuous Infusions: . piperacillin-tazobactam (ZOSYN)  IV 3.375 g (06/04/20 1417)     LOS: 12 days    Time spent: over 77 min    Fayrene Helper, MD [facility]   To contact the attending provider between 7A-7P or the covering provider during after hours 7P-7A, please log into the web site [email or web address] and access using universal [place] password for that web site. If you do not have the password, please call the hospital operator.  06/04/2020, 4:11 PM

## 2020-06-04 NOTE — Progress Notes (Signed)
Physical Therapy Wound Treatment Patient Details  Name: Kristie Tapia MRN: [id number] Date of Birth: Jun 19, 1970  Today's Date: 06/04/2020 Time: 0865-7846 Time Calculation (min): 30 min  Subjective  Subjective: Pt feels that there has been some inconsistencies in her care in relation to getting dressing changes following bowel movements Patient and Family Stated Goals: Avoid another surgery Date of Onset: 05/23/20 Prior Treatments: I&D in OR x 2  Pain Score:  8/10 (with some areas during pulsatile lavage; premedicated)  Wound Assessment  Wound / Incision (Open or Dehisced) 05/24/20 Incision - Dehisced Buttocks Right (Active)  Dressing Type ABD;Barrier Film (skin prep);Gauze (Comment);Moist to moist 06/04/20 0923  Dressing Changed Changed 06/04/20 0923  Dressing Status Clean;Dry;Intact 06/04/20 0923  Dressing Change Frequency Twice a day 06/04/20 0923  Site / Wound Assessment Yellow;Red;Pink;Painful 06/04/20 0923  % Wound base Red or Granulating 40% 06/03/20 1130  % Wound base Yellow/Fibrinous Exudate 55% 06/03/20 1130  % Wound base Black/Eschar 5% 06/03/20 1130  % Wound base Other/Granulation Tissue (Comment) 0% 06/03/20 1130  Peri-wound Assessment Intact 06/04/20 0923  Wound Length (cm) 11.5 cm 05/29/20 0924  Wound Width (cm) 5.8 cm 05/29/20 0924  Wound Depth (cm) 10 cm 05/29/20 0924  Wound Volume (cm^3) 667 cm^3 05/29/20 0924  Wound Surface Area (cm^2) 66.7 cm^2 05/29/20 0924  Tunneling (cm) 8 05/30/20 2245  Margins Attached edges (approximated) 06/03/20 1130  Closure None 06/03/20 1700  Drainage Amount Moderate 06/04/20 0923  Drainage Description Serosanguineous 06/04/20 9629  Treatment Debridement (Selective);Hydrotherapy (Pulse lavage);Packing (Saline gauze) 06/04/20 0923   Hydrotherapy Pulsed lavage therapy - wound location: rt buttock Pulsed Lavage with Suction (psi): 12 psi Pulsed Lavage with Suction - Normal Saline Used: 1000 mL Pulsed Lavage Tip: Tip with splash  shield Selective Debridement Selective Debridement - Location: rt buttock Selective Debridement - Tools Used: Scalpel;Scissors Selective Debridement - Tissue Removed: yellow unviable tissue   Wound Assessment and Plan  Wound Therapy - Assess/Plan/Recommendations Wound Therapy - Clinical Statement: Increased sanguineous drainage with minimal debridement. Still with increased pain at base of wound.  Will benefit from continued hydrotherapy and debridement. Wound Therapy - Functional Problem List: Open, painful wound and inadvisable to sit for prolonged periods due to wound location Factors Delaying/Impairing Wound Healing: Infection - systemic/local;Diabetes Mellitus Hydrotherapy Plan: Debridement;Dressing change;Patient/family education;Pulsatile lavage with suction Wound Therapy - Frequency: 6X / week Wound Therapy - Follow Up Recommendations: Home health RN Wound Plan: see above  Wound Therapy Goals- Improve the function of patient's integumentary system by progressing the wound(s) through the phases of wound healing (inflammation - proliferation - remodeling) by: Decrease Necrotic Tissue to: <40% Decrease Necrotic Tissue - Progress: Progressing toward goal Increase Granulation Tissue to: >40% Increase Granulation Tissue - Progress: Progressing toward goal Decrease Length/Width/Depth by (cm): 0/0/1.0cm Improve Drainage Characteristics: Min;Serous Improve Drainage Characteristics - Progress: Progressing toward goal Patient/Family will be able to : verbalize wound dressing application to instruct future caregivers Patient/Family Instruction Goal - Progress: Progressing toward goal Goals/treatment plan/discharge plan were made with and agreed upon by patient/family: Yes Time For Goal Achievement: 7 days Wound Therapy - Potential for Goals: Fair  Goals will be updated until maximal potential achieved or discharge criteria met.  Discharge criteria: when goals achieved, discharge from  hospital, MD decision/surgical intervention, no progress towards goals, refusal/missing three consecutive treatments without notification or medical reason.  GP  Wyona Almas, PT, DPT Acute Rehabilitation Services Pager [phone] Office [phone]      Deno Etienne 06/04/2020, 9:26 AM

## 2020-06-04 NOTE — Progress Notes (Signed)
Pharmacy Antibiotic Note  Taiwan Millon is a 50 y.o. female admitted on 05/23/2020 with Necrotizing soft tissue infection of R-buttock - culture growing few gardnerella vaginalis and prevotella sp, beta lactamase+.  Pharmacy has been consulted for Zosyn dosing.  Day 11 Zosyn. ID planning at least 21 days. Afebrile, WBC down to 17.8. SCr slightly up from baseline at 1.13.  Pt also on linezolid for VRE in urine. DDI with escitalopram noted, will monitor for s/sx of serotonin syndrome.  Plan: Continue Zosyn 3.375g IV q8h (4h infusion) Linezolid 600 mg PO BID x 5 days per ID Monitor clinical progress, c/s, renal function F/u ID plan for LOT   Height: 5\' 5"  (165.1 cm) Weight: 92.1 kg (203 lb 0.7 oz) IBW/kg (Calculated) : 57  Temp (24hrs), Avg:98.3 F (36.8 C), Min:98 F (36.7 C), Max:99.1 F (37.3 C)  Recent Labs  Lab 05/31/20 0125 06/01/20 0141 06/02/20 0220 06/03/20 0203 06/04/20 0345  WBC 22.5* 21.8* 20.2* 20.3* 17.8*  CREATININE 0.91 0.88 1.06* 1.13* 1.13*    Estimated Creatinine Clearance: 67.5 mL/min (A) (by C-G formula based on SCr of 1.13 mg/dL (H)).    Allergies  Allergen Reactions  . Sulfa Antibiotics Other (See Comments)    Burns the inside of my mouth; "blisters; leaves tongue and inside of mouth solid red"  . Tape Other (See Comments)    Blisters- USE PAPER TAPE ONLY  . Decongestant [Pseudoephedrine Hcl Er] Itching    ALL DECONGESTANTS  . Duloxetine Other (See Comments)    Felt "out of it"  . Fremanezumab-Vfrm Other (See Comments) and Nausea Only  . Tizanidine Other (See Comments)    headache  . Chlorpheniramine Itching  . Red Dye Other (See Comments)    CAUSES MIGRAINES  . Eggs Or Egg-Derived Products Nausea And Vomiting  . Latex Itching  . Percocet [Oxycodone-Acetaminophen] Other (See Comments)    PT reports having nightmares when taking Percocet  . Prednisone Other (See Comments)    "Gives me a bad attitude"  . Zoloft [Sertraline Hcl] Rash     08/02/20, PharmD, BCPS PGY2 Pharmacy Resident Phone between 7 am - 3:30 pm: Lulu Riding  Please check AMION for all [facility] Pharmacy phone numbers After 10:00 PM, call Main Pharmacy [phone]  06/04/2020 7:44 AM

## 2020-06-04 NOTE — Plan of Care (Signed)
  Problem: Clinical Measurements: Goal: Ability to maintain clinical measurements within normal limits will improve Outcome: Progressing Goal: Diagnostic test results will improve Outcome: Progressing   Problem: Elimination: Goal: Will not experience complications related to bowel motility Outcome: Progressing   Problem: Pain Managment: Goal: General experience of comfort will improve Outcome: Progressing

## 2020-06-05 DIAGNOSIS — M7989 Other specified soft tissue disorders: Secondary | ICD-10-CM | POA: Diagnosis not present

## 2020-06-05 LAB — CBC WITH DIFFERENTIAL/PLATELET
Abs Immature Granulocytes: 0.42 10*3/uL — ABNORMAL HIGH (ref 0.00–0.07)
Basophils Absolute: 0.1 10*3/uL (ref 0.0–0.1)
Basophils Relative: 1 %
Eosinophils Absolute: 0.3 10*3/uL (ref 0.0–0.5)
Eosinophils Relative: 2 %
HCT: 37 % (ref 36.0–46.0)
Hemoglobin: 11.9 g/dL — ABNORMAL LOW (ref 12.0–15.0)
Immature Granulocytes: 2 %
Lymphocytes Relative: 13 %
Lymphs Abs: 2.2 10*3/uL (ref 0.7–4.0)
MCH: 30.2 pg (ref 26.0–34.0)
MCHC: 32.2 g/dL (ref 30.0–36.0)
MCV: 93.9 fL (ref 80.0–100.0)
Monocytes Absolute: 1 10*3/uL (ref 0.1–1.0)
Monocytes Relative: 6 %
Neutro Abs: 13.6 10*3/uL — ABNORMAL HIGH (ref 1.7–7.7)
Neutrophils Relative %: 76 %
Platelets: 537 10*3/uL — ABNORMAL HIGH (ref 150–400)
RBC: 3.94 MIL/uL (ref 3.87–5.11)
RDW: 16.1 % — ABNORMAL HIGH (ref 11.5–15.5)
WBC: 17.7 10*3/uL — ABNORMAL HIGH (ref 4.0–10.5)
nRBC: 0 % (ref 0.0–0.2)

## 2020-06-05 LAB — PHOSPHORUS: Phosphorus: 4.5 mg/dL (ref 2.5–4.6)

## 2020-06-05 LAB — COMPREHENSIVE METABOLIC PANEL
ALT: 10 U/L (ref 0–44)
AST: 15 U/L (ref 15–41)
Albumin: 2.3 g/dL — ABNORMAL LOW (ref 3.5–5.0)
Alkaline Phosphatase: 90 U/L (ref 38–126)
Anion gap: 8 (ref 5–15)
BUN: 15 mg/dL (ref 6–20)
CO2: 30 mmol/L (ref 22–32)
Calcium: 8 mg/dL — ABNORMAL LOW (ref 8.9–10.3)
Chloride: 97 mmol/L — ABNORMAL LOW (ref 98–111)
Creatinine, Ser: 1.14 mg/dL — ABNORMAL HIGH (ref 0.44–1.00)
GFR, Estimated: 59 mL/min — ABNORMAL LOW (ref >60)
Glucose, Bld: 136 mg/dL — ABNORMAL HIGH (ref 70–99)
Potassium: 4.4 mmol/L (ref 3.5–5.1)
Sodium: 135 mmol/L (ref 135–145)
Total Bilirubin: 0.6 mg/dL (ref 0.3–1.2)
Total Protein: 6.3 g/dL — ABNORMAL LOW (ref 6.5–8.1)

## 2020-06-05 LAB — GLUCOSE, CAPILLARY
Glucose-Capillary: 101 mg/dL — ABNORMAL HIGH (ref 70–99)
Glucose-Capillary: 134 mg/dL — ABNORMAL HIGH (ref 70–99)
Glucose-Capillary: 139 mg/dL — ABNORMAL HIGH (ref 70–99)
Glucose-Capillary: 190 mg/dL — ABNORMAL HIGH (ref 70–99)
Glucose-Capillary: 193 mg/dL — ABNORMAL HIGH (ref 70–99)
Glucose-Capillary: 228 mg/dL — ABNORMAL HIGH (ref 70–99)

## 2020-06-05 LAB — MAGNESIUM: Magnesium: 2 mg/dL (ref 1.7–2.4)

## 2020-06-05 MED ORDER — INSULIN ASPART 100 UNIT/ML ~~LOC~~ SOLN
5.0000 [IU] | Freq: Three times a day (TID) | SUBCUTANEOUS | Status: DC
  Administered 2020-06-05 – 2020-06-06 (×4): 5 [IU] via SUBCUTANEOUS

## 2020-06-05 NOTE — Progress Notes (Signed)
PROGRESS NOTE    Kristie Tapia  BOF:[id number] DOB: [birth year] DOA: 05/23/2020 PCP: System, Provider Not In   Chief Complaint  Patient presents with  . Abdominal Pain    Brief Narrative:  Kristie Lowdermilkis an 50 y.o.femalewith pmh uncontrolled diabetes mellitus type 2, CVA, OSA, and chronic pain who was admitted on 12/20 for necrotizing fasciitis of the R buttock.She is awake and alert. She reported that she started with Journii Nierman boil on her buttock and tried to pop it. It spread and she tried to lance it without success. She as having abdominal pain as well and went to urgent care and eventually had Daimion Adamcik CT showing severe infection and so came in and had debridement.   Assessment & Plan:   Principal Problem:   Necrotizing soft tissue infection Active Problems:   Dyslipidemia   Diabetes (HCC)   Smoker unmotivated to quit   CVA (cerebral vascular accident) (HCC)   Obesity   Chronic musculoskeletal pain   Necrotizing soft tissue infection of buttock  -Status post I&D on 12/20 and 12/22  - wound care per surgery, hydrotherapy -WBC still elevated at 17.7 -Cultures positive for Prevotella (+beta lactamase) and gardnerella vaginalis -Initially started on vancomycin, cefepime, and Flagyl . Antibiotics switched to Zosyn per ID on 12/29.  Recommending at least 21 days abx total.   Diabetes mellitus type 2 (uncontrolled) -Patient admits to poor adherence to diabetic regimen at home -Hemoglobin A1c 8.9 -Hold Actos -Continue Lantus 22 units daily - mealtime -Continue CBGs with meals with moderate SSI -Appreciate diabetic education and adjust regimen as needed   Normocytic anemia: Stable.   -Continue to monitor   Enterococcus UTI:  linezolid x 5 days per ID   Hypoalbuminemia/moderate protein calorie malnutrition: Acute.  Albumin was noted to be low at 2.7 on 12/20.  Suspect hypoalbuminemia and recent surgical procedures likely leading to third spacing as cause for  swelling. -prealbumin 10.3 -Glucerna in between meals -Continue using Lasix as needed to help with fluid   Diastolic dysfunction -Last EF noted to be 55 to 60% with grade 1 diastolic dysfunction in 2019 -Patient with 2+ pitting edema in the lower extremies generalized -Strict intake and output -Daily weights -Check BNP 329 -Increase lasix to 40 mg PO daily, continue to monitor - improved swelling   Superficial Venous Thrombosis  Bilateral upper extremity cephalic vein superficial vein thrombosis    History of CVA -continue plavix   Depression and anxiety -Continue current regimen   Early onset dementia -Continue Aricept   GERD  PPI BID   PICC in place -2/2 poor access, follow   DVT prophylaxis: lovenox Code Status: full  Family Communication: daughter at bedside Disposition:   Status is: Inpatient  Remains inpatient appropriate because:Inpatient level of care appropriate due to severity of illness   Dispo: The patient is from: Home              Anticipated d/c is to: pending              Anticipated d/c date is: > 3 days              Patient currently is not medically stable to d/c.   Consultants:   [facility] consulting  ID  General surgery is primary  Procedures:  I&D x 2  Antimicrobials:  Anti-infectives (From admission, onward)   Start     Dose/Rate Route Frequency Ordered Stop   06/02/20 1545  linezolid (ZYVOX) tablet 600 mg  600 mg Oral Every 12 hours 06/02/20 1452 06/06/20 0959   06/01/20 1400  piperacillin-tazobactam (ZOSYN) IVPB 3.375 g        3.375 g 12.5 mL/hr over 240 Minutes Intravenous Every 8 hours 06/01/20 0945     05/26/20 1400  metroNIDAZOLE (FLAGYL) tablet 500 mg  Status:  Discontinued        500 mg Oral Every 8 hours 05/26/20 0853 06/01/20 0939   05/25/20 2200  vancomycin (VANCOCIN) IVPB 1000 mg/200 mL premix  Status:  Discontinued        1,000 mg 200 mL/hr over 60 Minutes Intravenous Every 12 hours 05/25/20 0918  05/30/20 1129   05/24/20 1800  vancomycin (VANCOREADY) IVPB 750 mg/150 mL  Status:  Discontinued        750 mg 150 mL/hr over 60 Minutes Intravenous Every 24 hours 05/23/20 1914 05/24/20 0811   05/24/20 1400  ceFEPIme (MAXIPIME) 2 g in sodium chloride 0.9 % 100 mL IVPB  Status:  Discontinued        2 g 200 mL/hr over 30 Minutes Intravenous Every 8 hours 05/24/20 0811 06/01/20 0939   05/24/20 0900  vancomycin (VANCOCIN) IVPB 1000 mg/200 mL premix  Status:  Discontinued        1,000 mg 200 mL/hr over 60 Minutes Intravenous Every 12 hours 05/24/20 0811 05/25/20 0918   05/24/20 0800  ceFEPIme (MAXIPIME) 2 g in sodium chloride 0.9 % 100 mL IVPB  Status:  Discontinued        2 g 200 mL/hr over 30 Minutes Intravenous Every 12 hours 05/23/20 1914 05/24/20 0811   05/23/20 2200  clindamycin (CLEOCIN) IVPB 600 mg  Status:  Discontinued        600 mg 100 mL/hr over 30 Minutes Intravenous Every 8 hours 05/23/20 2129 05/26/20 0853   05/23/20 1830  vancomycin (VANCOREADY) IVPB 1500 mg/300 mL        1,500 mg 150 mL/hr over 120 Minutes Intravenous  Once 05/23/20 1813 05/23/20 2229   05/23/20 1800  ceFEPIme (MAXIPIME) 2 g in sodium chloride 0.9 % 100 mL IVPB        2 g 200 mL/hr over 30 Minutes Intravenous  Once 05/23/20 1750 05/23/20 1934   05/23/20 1800  metroNIDAZOLE (FLAGYL) IVPB 500 mg        500 mg 100 mL/hr over 60 Minutes Intravenous  Once 05/23/20 1750 05/23/20 2027   05/23/20 1800  vancomycin (VANCOCIN) IVPB 1000 mg/200 mL premix  Status:  Discontinued        1,000 mg 200 mL/hr over 60 Minutes Intravenous  Once 05/23/20 1750 05/23/20 1813      Subjective: Frustrated at length of time it's taking for dressing changes C/o pain  Objective: Vitals:   06/04/20 1425 06/04/20 2101 06/05/20 0447 06/05/20 0740  BP: (!) 106/57 (!) 103/47 98/68 (!) 105/56  Pulse: 70 70 73 68  Resp: 17 16 18 17   Temp: 98 F (36.7 C) 98.7 F (37.1 C) 97.9 F (36.6 C) 97.6 F (36.4 C)  TempSrc: Oral Oral   Oral  SpO2: 94% 96% 93% 94%  Weight:      Height:        Intake/Output Summary (Last 24 hours) at 06/05/2020 1448 Last data filed at 06/05/2020 0900 Gross per 24 hour  Intake 480 ml  Output --  Net 480 ml   Filed Weights   05/23/20 1542 05/24/20 0200  Weight: 87.1 kg 92.1 kg    Examination:  General: No acute distress. Cardiovascular:  Heart sounds show Twisha Vanpelt regular rate, and rhythm. Lungs: Clear to auscultation bilaterally  Abdomen: Soft, nontender, nondistended  Neurological: Alert and oriented 3. Moves all extremities 4. Cranial nerves II through XII grossly intact. Skin: extensive gluteal woudn Extremities: No clubbing or cyanosis. Trace edema      Data Reviewed: I have personally reviewed following labs and imaging studies  CBC: Recent Labs  Lab 06/01/20 0141 06/02/20 0220 06/03/20 0203 06/04/20 0345 06/05/20 0418  WBC 21.8* 20.2* 20.3* 17.8* 17.7*  NEUTROABS  --   --   --  13.6* 13.6*  HGB 11.6* 11.0* 11.4* 12.1 11.9*  HCT 34.7* 32.9* 35.5* 37.2 37.0  MCV 89.2 90.1 91.7 92.5 93.9  PLT 438* 433* 472* 510* 537*    Basic Metabolic Panel: Recent Labs  Lab 05/30/20 0236 05/31/20 0125 06/01/20 0141 06/02/20 0220 06/03/20 0203 06/04/20 0345 06/05/20 0418  NA 141   < > 138 137 135 138 135  K 3.1*   < > 3.6 3.3* 4.1 4.0 4.4  CL 107   < > 102 95* 95* 97* 97*  CO2 26   < > 28 34* 34* 32 30  GLUCOSE 193*   < > 160* 177* 206* 143* 136*  BUN 13   < > 7 14 15 11 15   CREATININE 1.06*   < > 0.88 1.06* 1.13* 1.13* 1.14*  CALCIUM 7.8*   < > 7.6* 7.6* 7.8* 8.2* 8.0*  MG 1.9  --   --   --   --  2.1 2.0  PHOS  --   --   --   --   --  4.4 4.5   < > = values in this interval not displayed.    GFR: Estimated Creatinine Clearance: 66.9 mL/min (Madisin Hasan) (by C-G formula based on SCr of 1.14 mg/dL (H)).  Liver Function Tests: Recent Labs  Lab 06/04/20 0345 06/05/20 0418  AST 17 15  ALT 10 10  ALKPHOS 104 90  BILITOT 0.6 0.6  PROT 6.0* 6.3*  ALBUMIN 2.2* 2.3*     CBG: Recent Labs  Lab 06/04/20 2100 06/05/20 0015 06/05/20 0449 06/05/20 0816 06/05/20 1142  GLUCAP 257* 101* 139* 193* 228*     Recent Results (from the past 240 hour(s))  Culture, Urine     Status: Abnormal   Collection Time: 06/01/20  5:06 PM   Specimen: Urine, Random  Result Value Ref Range Status   Specimen Description URINE, RANDOM  Final   Special Requests   Final    NONE Performed at [facility], [place] [address], [place], [place] [address]    Culture (Shuronda Santino)  Final    >=100,000 COLONIES/mL VANCOMYCIN RESISTANT ENTEROCOCCUS   Report Status 06/03/2020 FINAL  Final   Organism ID, Bacteria VANCOMYCIN RESISTANT ENTEROCOCCUS (Trace Cederberg)  Final      Susceptibility   Vancomycin resistant enterococcus - MIC*    AMPICILLIN >=32 RESISTANT Resistant     NITROFURANTOIN 32 SENSITIVE Sensitive     VANCOMYCIN >=32 RESISTANT Resistant     LINEZOLID 2 SENSITIVE Sensitive     * >=100,000 COLONIES/mL VANCOMYCIN RESISTANT ENTEROCOCCUS         Radiology Studies: No results found.      Scheduled Meds: . acetic acid   Topical TID  . Chlorhexidine Gluconate Cloth  6 each Topical Daily  . clopidogrel  75 mg Oral Daily  . donepezil  10 mg Oral Daily  . enoxaparin (LOVENOX) injection  40 mg Subcutaneous Q24H  . escitalopram  20 mg Oral QHS  . famotidine  40 mg Oral QHS  . feeding supplement  237 mL Oral QHS  . feeding supplement (GLUCERNA SHAKE)  237 mL Oral TID BM  . furosemide  40 mg Oral Daily  . insulin aspart  0-15 Units Subcutaneous TID WC & HS  . insulin aspart  3 Units Subcutaneous TID WC  . insulin glargine  22 Units Subcutaneous Daily  . linezolid  600 mg Oral Q12H  . multivitamin with minerals  1 tablet Oral Daily  . nutrition supplement (JUVEN)  1 packet Oral BID BM  . pantoprazole  40 mg Oral BID  . polycarbophil  625 mg Oral Daily  . pregabalin  100 mg Oral q AM  . pregabalin  200 mg Oral QHS  . rosuvastatin  20 mg Oral QHS  . saccharomyces boulardii  250  mg Oral BID  . sodium chloride flush  10-40 mL Intracatheter Q12H  . sodium hypochlorite   Irrigation TID  . traZODone  150 mg Oral QHS   Continuous Infusions: . piperacillin-tazobactam (ZOSYN)  IV 3.375 g (06/05/20 1402)     LOS: 13 days    Time spent: over 30 min    Lacretia Nicks, MD [facility]   To contact the attending provider between 7A-7P or the covering provider during after hours 7P-7A, please log into the web site [email or web address] and access using universal [place] password for that web site. If you do not have the password, please call the hospital operator.  06/05/2020, 2:48 PM

## 2020-06-05 NOTE — Progress Notes (Signed)
Patient ID: Kristie Tapia, female   DOB: [birth year], 50 y.o.   MRN: [id number] [facility] Surgery Progress Note:   11 Days Post-Op  Subjective: Mental status is alert-she is watching TV and is more relaxed.  Complaints fewer. Objective: Vital signs in last 24 hours: Temp:  [97.6 F (36.4 C)-98.7 F (37.1 C)] 97.6 F (36.4 C) (01/02 0740) Pulse Rate:  [68-73] 68 (01/02 0740) Resp:  [16-18] 17 (01/02 0740) BP: (98-106)/(47-68) 105/56 (01/02 0740) SpO2:  [93 %-96 %] 94 % (01/02 0740)  Intake/Output from previous day: 01/01 0701 - 01/02 0700 In: 720 [P.O.:720] Out: 4 [Urine:3; Stool:1] Intake/Output this shift: Total I/O In: 240 [P.O.:240] Out: -   Physical Exam: Work of breathing is normal.  Mesh panties in place holding packing.    Lab Results:  Results for orders placed or performed during the hospital encounter of 05/23/20 (from the past 48 hour(s))  Glucose, capillary     Status: Abnormal   Collection Time: 06/03/20 12:01 PM  Result Value Ref Range   Glucose-Capillary 168 (H) 70 - 99 mg/dL    Comment: Glucose reference range applies only to samples taken after fasting for at least 8 hours.  Glucose, capillary     Status: Abnormal   Collection Time: 06/03/20  4:44 PM  Result Value Ref Range   Glucose-Capillary 249 (H) 70 - 99 mg/dL    Comment: Glucose reference range applies only to samples taken after fasting for at least 8 hours.  Glucose, capillary     Status: Abnormal   Collection Time: 06/03/20  7:53 PM  Result Value Ref Range   Glucose-Capillary 196 (H) 70 - 99 mg/dL    Comment: Glucose reference range applies only to samples taken after fasting for at least 8 hours.   Comment 1 Document in Chart   Glucose, capillary     Status: Abnormal   Collection Time: 06/04/20 12:26 AM  Result Value Ref Range   Glucose-Capillary 157 (H) 70 - 99 mg/dL    Comment: Glucose reference range applies only to samples taken after fasting for at least 8 hours.   Comment 1  Document in Chart   Glucose, capillary     Status: Abnormal   Collection Time: 06/04/20  3:43 AM  Result Value Ref Range   Glucose-Capillary 145 (H) 70 - 99 mg/dL    Comment: Glucose reference range applies only to samples taken after fasting for at least 8 hours.   Comment 1 Document in Chart   CBC with Differential/Platelet     Status: Abnormal   Collection Time: 06/04/20  3:45 AM  Result Value Ref Range   WBC 17.8 (H) 4.0 - 10.5 K/uL   RBC 4.02 3.87 - 5.11 MIL/uL   Hemoglobin 12.1 12.0 - 15.0 g/dL   HCT 56.3 87.5 - 64.3 %   MCV 92.5 80.0 - 100.0 fL   MCH 30.1 26.0 - 34.0 pg   MCHC 32.5 30.0 - 36.0 g/dL   RDW 32.9 (H) 51.8 - 84.1 %   Platelets 510 (H) 150 - 400 K/uL   nRBC 0.0 0.0 - 0.2 %   Neutrophils Relative % 76 %   Neutro Abs 13.6 (H) 1.7 - 7.7 K/uL   Lymphocytes Relative 12 %   Lymphs Abs 2.1 0.7 - 4.0 K/uL   Monocytes Relative 5 %   Monocytes Absolute 0.9 0.1 - 1.0 K/uL   Eosinophils Relative 3 %   Eosinophils Absolute 0.6 (H) 0.0 - 0.5 K/uL  Basophils Relative 1 %   Basophils Absolute 0.1 0.0 - 0.1 K/uL   Immature Granulocytes 3 %   Abs Immature Granulocytes 0.52 (H) 0.00 - 0.07 K/uL    Comment: Performed at [facility], [place] [address], [place], [place] [address]  Comprehensive metabolic panel     Status: Abnormal   Collection Time: 06/04/20  3:45 AM  Result Value Ref Range   Sodium 138 135 - 145 mmol/L   Potassium 4.0 3.5 - 5.1 mmol/L   Chloride 97 (L) 98 - 111 mmol/L   CO2 32 22 - 32 mmol/L   Glucose, Bld 143 (H) 70 - 99 mg/dL    Comment: Glucose reference range applies only to samples taken after fasting for at least 8 hours.   BUN 11 6 - 20 mg/dL   Creatinine, Ser 1.13 (H) 0.44 - 1.00 mg/dL   Calcium 8.2 (L) 8.9 - 10.3 mg/dL   Total Protein 6.0 (L) 6.5 - 8.1 g/dL   Albumin 2.2 (L) 3.5 - 5.0 g/dL   AST 17 15 - 41 U/L   ALT 10 0 - 44 U/L   Alkaline Phosphatase 104 38 - 126 U/L   Total Bilirubin 0.6 0.3 - 1.2 mg/dL   GFR, Estimated 60 (L) >60  mL/min    Comment: (NOTE) Calculated using the CKD-EPI Creatinine Equation (2021)    Anion gap 9 5 - 15    Comment: Performed at [facility], [place] [address] [place], [place] [address]  Magnesium     Status: None   Collection Time: 06/04/20  3:45 AM  Result Value Ref Range   Magnesium 2.1 1.7 - 2.4 mg/dL    Comment: Performed at [facility] [address] [place], [place] [address]  Phosphorus     Status: None   Collection Time: 06/04/20  3:45 AM  Result Value Ref Range   Phosphorus 4.4 2.5 - 4.6 mg/dL    Comment: Performed at [place] [address] [place], [place] [address]  Glucose, capillary     Status: Abnormal   Collection Time: 06/04/20  8:37 AM  Result Value Ref Range   Glucose-Capillary 136 (H) 70 - 99 mg/dL    Comment: Glucose reference range applies only to samples taken after fasting for at least 8 hours.  Glucose, capillary     Status: Abnormal   Collection Time: 06/04/20 11:29 AM  Result Value Ref Range   Glucose-Capillary 156 (H) 70 - 99 mg/dL    Comment: Glucose reference range applies only to samples taken after fasting for at least 8 hours.  Glucose, capillary     Status: Abnormal   Collection Time: 06/04/20  4:16 PM  Result Value Ref Range   Glucose-Capillary 226 (H) 70 - 99 mg/dL    Comment: Glucose reference range applies only to samples taken after fasting for at least 8 hours.  Glucose, capillary     Status: Abnormal   Collection Time: 06/04/20  9:00 PM  Result Value Ref Range   Glucose-Capillary 257 (H) 70 - 99 mg/dL    Comment: Glucose reference range applies only to samples taken after fasting for at least 8 hours.  Glucose, capillary     Status: Abnormal   Collection Time: 06/05/20 12:15 AM  Result Value Ref Range   Glucose-Capillary 101 (H) 70 - 99 mg/dL    Comment: Glucose reference range applies only to samples taken after fasting for at least 8 hours.  CBC with Differential/Platelet     Status: Abnormal   Collection  Time: 06/05/20  4:18 AM  Result Value Ref Range   WBC 17.7 (H) 4.0 - 10.5 K/uL   RBC 3.94 3.87 - 5.11 MIL/uL   Hemoglobin 11.9 (L) 12.0 - 15.0 g/dL   HCT 67.1 24.5 - 80.9 %   MCV 93.9 80.0 - 100.0 fL   MCH 30.2 26.0 - 34.0 pg   MCHC 32.2 30.0 - 36.0 g/dL   RDW 98.3 (H) 38.2 - 50.5 %   Platelets 537 (H) 150 - 400 K/uL   nRBC 0.0 0.0 - 0.2 %   Neutrophils Relative % 76 %   Neutro Abs 13.6 (H) 1.7 - 7.7 K/uL   Lymphocytes Relative 13 %   Lymphs Abs 2.2 0.7 - 4.0 K/uL   Monocytes Relative 6 %   Monocytes Absolute 1.0 0.1 - 1.0 K/uL   Eosinophils Relative 2 %   Eosinophils Absolute 0.3 0.0 - 0.5 K/uL   Basophils Relative 1 %   Basophils Absolute 0.1 0.0 - 0.1 K/uL   Immature Granulocytes 2 %   Abs Immature Granulocytes 0.42 (H) 0.00 - 0.07 K/uL    Comment: Performed at [facility], [address] [place], [place] [address]  Comprehensive metabolic panel     Status: Abnormal   Collection Time: 06/05/20  4:18 AM  Result Value Ref Range   Sodium 135 135 - 145 mmol/L   Potassium 4.4 3.5 - 5.1 mmol/L   Chloride 97 (L) 98 - 111 mmol/L   CO2 30 22 - 32 mmol/L   Glucose, Bld 136 (H) 70 - 99 mg/dL    Comment: Glucose reference range applies only to samples taken after fasting for at least 8 hours.   BUN 15 6 - 20 mg/dL   Creatinine, Ser 3.41 (H) 0.44 - 1.00 mg/dL   Calcium 8.0 (L) 8.9 - 10.3 mg/dL   Total Protein 6.3 (L) 6.5 - 8.1 g/dL   Albumin 2.3 (L) 3.5 - 5.0 g/dL   AST 15 15 - 41 U/L   ALT 10 0 - 44 U/L   Alkaline Phosphatase 90 38 - 126 U/L   Total Bilirubin 0.6 0.3 - 1.2 mg/dL   GFR, Estimated 59 (L) >60 mL/min    Comment: (NOTE) Calculated using the CKD-EPI Creatinine Equation (2021)    Anion gap 8 5 - 15    Comment: Performed at [facility], [address], [place], [place] [address]  Magnesium     Status: None   Collection Time: 06/05/20  4:18 AM  Result Value Ref Range   Magnesium 2.0 1.7 - 2.4 mg/dL    Comment: Performed at [facility],  [address] [place], [place] [address]  Phosphorus     Status: None   Collection Time: 06/05/20  4:18 AM  Result Value Ref Range   Phosphorus 4.5 2.5 - 4.6 mg/dL    Comment: Performed at [facility], [address] [place], [place] [address]  Glucose, capillary     Status: Abnormal   Collection Time: 06/05/20  4:49 AM  Result Value Ref Range   Glucose-Capillary 139 (H) 70 - 99 mg/dL    Comment: Glucose reference range applies only to samples taken after fasting for at least 8 hours.  Glucose, capillary     Status: Abnormal   Collection Time: 06/05/20  8:16 AM  Result Value Ref Range   Glucose-Capillary 193 (H) 70 -  99 mg/dL    Comment: Glucose reference range applies only to samples taken after fasting for at least 8 hours.    Radiology/Results: No results found.  Anti-infectives: Anti-infectives (From admission, onward)   Start     Dose/Rate Route Frequency Ordered Stop   06/02/20 1545  linezolid (ZYVOX) tablet 600 mg        600 mg Oral Every 12 hours 06/02/20 1452 06/06/20 0959   06/01/20 1400  piperacillin-tazobactam (ZOSYN) IVPB 3.375 g        3.375 g 12.5 mL/hr over 240 Minutes Intravenous Every 8 hours 06/01/20 0945     05/26/20 1400  metroNIDAZOLE (FLAGYL) tablet 500 mg  Status:  Discontinued        500 mg Oral Every 8 hours 05/26/20 0853 06/01/20 0939   05/25/20 2200  vancomycin (VANCOCIN) IVPB 1000 mg/200 mL premix  Status:  Discontinued        1,000 mg 200 mL/hr over 60 Minutes Intravenous Every 12 hours 05/25/20 0918 05/30/20 1129   05/24/20 1800  vancomycin (VANCOREADY) IVPB 750 mg/150 mL  Status:  Discontinued        750 mg 150 mL/hr over 60 Minutes Intravenous Every 24 hours 05/23/20 1914 05/24/20 0811   05/24/20 1400  ceFEPIme (MAXIPIME) 2 g in sodium chloride 0.9 % 100 mL IVPB  Status:  Discontinued        2 g 200 mL/hr over 30 Minutes Intravenous Every 8 hours 05/24/20 0811 06/01/20 0939   05/24/20 0900  vancomycin (VANCOCIN) IVPB 1000 mg/200 mL premix   Status:  Discontinued        1,000 mg 200 mL/hr over 60 Minutes Intravenous Every 12 hours 05/24/20 0811 05/25/20 0918   05/24/20 0800  ceFEPIme (MAXIPIME) 2 g in sodium chloride 0.9 % 100 mL IVPB  Status:  Discontinued        2 g 200 mL/hr over 30 Minutes Intravenous Every 12 hours 05/23/20 1914 05/24/20 0811   05/23/20 2200  clindamycin (CLEOCIN) IVPB 600 mg  Status:  Discontinued        600 mg 100 mL/hr over 30 Minutes Intravenous Every 8 hours 05/23/20 2129 05/26/20 0853   05/23/20 1830  vancomycin (VANCOREADY) IVPB 1500 mg/300 mL        1,500 mg 150 mL/hr over 120 Minutes Intravenous  Once 05/23/20 1813 05/23/20 2229   05/23/20 1800  ceFEPIme (MAXIPIME) 2 g in sodium chloride 0.9 % 100 mL IVPB        2 g 200 mL/hr over 30 Minutes Intravenous  Once 05/23/20 1750 05/23/20 1934   05/23/20 1800  metroNIDAZOLE (FLAGYL) IVPB 500 mg        500 mg 100 mL/hr over 60 Minutes Intravenous  Once 05/23/20 1750 05/23/20 2027   05/23/20 1800  vancomycin (VANCOCIN) IVPB 1000 mg/200 mL premix  Status:  Discontinued        1,000 mg 200 mL/hr over 60 Minutes Intravenous  Once 05/23/20 1750 05/23/20 1813      Assessment/Plan: Problem List: Patient Active Problem List   Diagnosis Date Noted  . Status post surgery 05/23/2020  . Necrotizing soft tissue infection 05/23/2020  . Lower respiratory infection 07/06/2018  . Cough 07/06/2018  . Chronic musculoskeletal pain 07/06/2018  . CVA (cerebral vascular accident) (HCC) 12/27/2017  . Tobacco abuse 12/27/2017  . Obesity 12/27/2017  . Ischemic stroke (HCC) 12/27/2017  . Diabetic peripheral neuropathy (HCC) 12/27/2017  . Smoker unmotivated to quit 07/07/2017  . Chronic GERD 07/07/2017  .  Chronic anxiety 07/07/2017  . Dyslipidemia 02/24/2017  . Diabetes (HCC) 02/24/2017  . Headache 02/24/2017    Improved;  Should be able to go home with wound packing assistance this week.  WBC down to 17K.   11 Days Post-Op    LOS: 13 days   Matt B. Daphine Deutscher,  MD, [facility] [phone] to reach the surgeon on call.    06/05/2020 10:28 AM

## 2020-06-06 LAB — CBC WITH DIFFERENTIAL/PLATELET
Abs Immature Granulocytes: 0.25 10*3/uL — ABNORMAL HIGH (ref 0.00–0.07)
Basophils Absolute: 0.1 10*3/uL (ref 0.0–0.1)
Basophils Relative: 1 %
Eosinophils Absolute: 0.4 10*3/uL (ref 0.0–0.5)
Eosinophils Relative: 3 %
HCT: 38.5 % (ref 36.0–46.0)
Hemoglobin: 12.4 g/dL (ref 12.0–15.0)
Immature Granulocytes: 2 %
Lymphocytes Relative: 13 %
Lymphs Abs: 2.1 10*3/uL (ref 0.7–4.0)
MCH: 30 pg (ref 26.0–34.0)
MCHC: 32.2 g/dL (ref 30.0–36.0)
MCV: 93.2 fL (ref 80.0–100.0)
Monocytes Absolute: 1 10*3/uL (ref 0.1–1.0)
Monocytes Relative: 6 %
Neutro Abs: 12.3 10*3/uL — ABNORMAL HIGH (ref 1.7–7.7)
Neutrophils Relative %: 75 %
Platelets: 562 10*3/uL — ABNORMAL HIGH (ref 150–400)
RBC: 4.13 MIL/uL (ref 3.87–5.11)
RDW: 16.1 % — ABNORMAL HIGH (ref 11.5–15.5)
WBC: 16.1 10*3/uL — ABNORMAL HIGH (ref 4.0–10.5)
nRBC: 0 % (ref 0.0–0.2)

## 2020-06-06 LAB — COMPREHENSIVE METABOLIC PANEL
ALT: 11 U/L (ref 0–44)
AST: 17 U/L (ref 15–41)
Albumin: 2.5 g/dL — ABNORMAL LOW (ref 3.5–5.0)
Alkaline Phosphatase: 106 U/L (ref 38–126)
Anion gap: 12 (ref 5–15)
BUN: 12 mg/dL (ref 6–20)
CO2: 28 mmol/L (ref 22–32)
Calcium: 8.3 mg/dL — ABNORMAL LOW (ref 8.9–10.3)
Chloride: 96 mmol/L — ABNORMAL LOW (ref 98–111)
Creatinine, Ser: 1.28 mg/dL — ABNORMAL HIGH (ref 0.44–1.00)
GFR, Estimated: 51 mL/min — ABNORMAL LOW (ref >60)
Glucose, Bld: 152 mg/dL — ABNORMAL HIGH (ref 70–99)
Potassium: 4.4 mmol/L (ref 3.5–5.1)
Sodium: 136 mmol/L (ref 135–145)
Total Bilirubin: 0.8 mg/dL (ref 0.3–1.2)
Total Protein: 6.8 g/dL (ref 6.5–8.1)

## 2020-06-06 LAB — PHOSPHORUS: Phosphorus: 5.1 mg/dL — ABNORMAL HIGH (ref 2.5–4.6)

## 2020-06-06 LAB — GLUCOSE, CAPILLARY
Glucose-Capillary: 125 mg/dL — ABNORMAL HIGH (ref 70–99)
Glucose-Capillary: 148 mg/dL — ABNORMAL HIGH (ref 70–99)
Glucose-Capillary: 154 mg/dL — ABNORMAL HIGH (ref 70–99)
Glucose-Capillary: 196 mg/dL — ABNORMAL HIGH (ref 70–99)
Glucose-Capillary: 197 mg/dL — ABNORMAL HIGH (ref 70–99)
Glucose-Capillary: 271 mg/dL — ABNORMAL HIGH (ref 70–99)

## 2020-06-06 LAB — MAGNESIUM: Magnesium: 2.1 mg/dL (ref 1.7–2.4)

## 2020-06-06 MED ORDER — INSULIN ASPART 100 UNIT/ML ~~LOC~~ SOLN
7.0000 [IU] | Freq: Three times a day (TID) | SUBCUTANEOUS | Status: DC
  Administered 2020-06-07 – 2020-06-10 (×11): 7 [IU] via SUBCUTANEOUS

## 2020-06-06 NOTE — Plan of Care (Signed)
  Problem: Pain Managment: Goal: General experience of comfort will improve Outcome: Progressing   Problem: Safety: Goal: Ability to remain free from injury will improve Outcome: Progressing   Problem: Skin Integrity: Goal: Risk for impaired skin integrity will decrease Outcome: Progressing   

## 2020-06-06 NOTE — Progress Notes (Signed)
Progress Note: General Surgery Service   Chief Complaint/Subjective: Upper back pain, walking in room some but gets "woozy"  Objective: Vital signs in last 24 hours: Temp:  [97.8 F (36.6 C)-98.9 F (37.2 C)] 98.1 F (36.7 C) (01/03 0759) Pulse Rate:  [64-73] 64 (01/03 0759) Resp:  [17-18] 17 (01/03 0759) BP: (101-111)/(58-60) 111/58 (01/03 0759) SpO2:  [91 %-99 %] 91 % (01/03 0759) Last BM Date: 06/04/20  Intake/Output from previous day: 01/02 0701 - 01/03 0700 In: 480 [P.O.:480] Out: 5 [Urine:5] Intake/Output this shift: No intake/output data recorded.  Gen: NAD  Resp: nonlabored  Card: RRR  Abd: soft, NT, ND  Back: right sided wound packed, tan subcutaneous tissue no granulation  Lab Results: CBC  Recent Labs    06/05/20 0418 06/06/20 0425  WBC 17.7* 16.1*  HGB 11.9* 12.4  HCT 37.0 38.5  PLT 537* 562*   BMET Recent Labs    06/05/20 0418 06/06/20 0425  NA 135 136  K 4.4 4.4  CL 97* 96*  CO2 30 28  GLUCOSE 136* 152*  BUN 15 12  CREATININE 1.14* 1.28*  CALCIUM 8.0* 8.3*   PT/INR No results for input(s): LABPROT, INR in the last 72 hours. ABG No results for input(s): PHART, HCO3 in the last 72 hours.  Invalid input(s): PCO2, PO2  Anti-infectives: Anti-infectives (From admission, onward)   Start     Dose/Rate Route Frequency Ordered Stop   06/02/20 1545  linezolid (ZYVOX) tablet 600 mg        600 mg Oral Every 12 hours 06/02/20 1452 06/05/20 2133   06/01/20 1400  piperacillin-tazobactam (ZOSYN) IVPB 3.375 g        3.375 g 12.5 mL/hr over 240 Minutes Intravenous Every 8 hours 06/01/20 0945     05/26/20 1400  metroNIDAZOLE (FLAGYL) tablet 500 mg  Status:  Discontinued        500 mg Oral Every 8 hours 05/26/20 0853 06/01/20 0939   05/25/20 2200  vancomycin (VANCOCIN) IVPB 1000 mg/200 mL premix  Status:  Discontinued        1,000 mg 200 mL/hr over 60 Minutes Intravenous Every 12 hours 05/25/20 0918 05/30/20 1129   05/24/20 1800  vancomycin  (VANCOREADY) IVPB 750 mg/150 mL  Status:  Discontinued        750 mg 150 mL/hr over 60 Minutes Intravenous Every 24 hours 05/23/20 1914 05/24/20 0811   05/24/20 1400  ceFEPIme (MAXIPIME) 2 g in sodium chloride 0.9 % 100 mL IVPB  Status:  Discontinued        2 g 200 mL/hr over 30 Minutes Intravenous Every 8 hours 05/24/20 0811 06/01/20 0939   05/24/20 0900  vancomycin (VANCOCIN) IVPB 1000 mg/200 mL premix  Status:  Discontinued        1,000 mg 200 mL/hr over 60 Minutes Intravenous Every 12 hours 05/24/20 0811 05/25/20 0918   05/24/20 0800  ceFEPIme (MAXIPIME) 2 g in sodium chloride 0.9 % 100 mL IVPB  Status:  Discontinued        2 g 200 mL/hr over 30 Minutes Intravenous Every 12 hours 05/23/20 1914 05/24/20 0811   05/23/20 2200  clindamycin (CLEOCIN) IVPB 600 mg  Status:  Discontinued        600 mg 100 mL/hr over 30 Minutes Intravenous Every 8 hours 05/23/20 2129 05/26/20 0853   05/23/20 1830  vancomycin (VANCOREADY) IVPB 1500 mg/300 mL        1,500 mg 150 mL/hr over 120 Minutes Intravenous  Once 05/23/20 1813  05/23/20 2229   05/23/20 1800  ceFEPIme (MAXIPIME) 2 g in sodium chloride 0.9 % 100 mL IVPB        2 g 200 mL/hr over 30 Minutes Intravenous  Once 05/23/20 1750 05/23/20 1934   05/23/20 1800  metroNIDAZOLE (FLAGYL) IVPB 500 mg        500 mg 100 mL/hr over 60 Minutes Intravenous  Once 05/23/20 1750 05/23/20 2027   05/23/20 1800  vancomycin (VANCOCIN) IVPB 1000 mg/200 mL premix  Status:  Discontinued        1,000 mg 200 mL/hr over 60 Minutes Intravenous  Once 05/23/20 1750 05/23/20 1813      Medications: Scheduled Meds: . acetic acid   Topical TID  . Chlorhexidine Gluconate Cloth  6 each Topical Daily  . clopidogrel  75 mg Oral Daily  . donepezil  10 mg Oral Daily  . enoxaparin (LOVENOX) injection  40 mg Subcutaneous Q24H  . escitalopram  20 mg Oral QHS  . famotidine  40 mg Oral QHS  . feeding supplement  237 mL Oral QHS  . feeding supplement (GLUCERNA SHAKE)  237 mL Oral  TID BM  . insulin aspart  0-15 Units Subcutaneous TID WC & HS  . insulin aspart  5 Units Subcutaneous TID WC  . insulin glargine  22 Units Subcutaneous Daily  . multivitamin with minerals  1 tablet Oral Daily  . nutrition supplement (JUVEN)  1 packet Oral BID BM  . pantoprazole  40 mg Oral BID  . polycarbophil  625 mg Oral Daily  . pregabalin  100 mg Oral q AM  . pregabalin  200 mg Oral QHS  . rosuvastatin  20 mg Oral QHS  . saccharomyces boulardii  250 mg Oral BID  . sodium chloride flush  10-40 mL Intracatheter Q12H  . sodium hypochlorite   Irrigation TID  . traZODone  150 mg Oral QHS   Continuous Infusions: . piperacillin-tazobactam (ZOSYN)  IV 3.375 g (06/06/20 0602)   PRN Meds:.acetaminophen, albuterol, alum & mag hydroxide-simeth, diphenhydrAMINE, fluticasone, HYDROcodone-acetaminophen, HYDROmorphone (DILAUDID) injection, LORazepam, ondansetron (ZOFRAN) IV, promethazine **OR** promethazine **OR** promethazine, sodium chloride flush, SUMAtriptan  Assessment/Plan: s/p Procedure(s): INCISION AND DRAINAGE BUTTOCK ABSCESS 05/25/2020  Buttock and retroperitoneal abscess- S/P debridement by Dr. Derrell Lolling 12/20, 12/22, cont BID dressing changes, switch back to saline Acute hypoxic ventilator dependent respiratory failure-resolved FEN- persistent hyperglycemia, but improving ID-cultures polymicrobial gram stain, so far only Gardnerella, cover anaerobes, GPC and GNR. vanc 12/20-12/27/cefepime 12/20-12/29, zosyn 12/29-->  VTE- Lovenoxppx dosing.  Pain controloptimize PO meds  Dispo- med surg, discharge planning for home with home health   LOS: 14 days   Rodman Pickle, MD [phone] [facility]

## 2020-06-06 NOTE — Progress Notes (Signed)
Physical Therapy Treatment Patient Details Name: Kristie Tapia MRN: [id number] DOB: [birth year] Today's Date: 06/06/2020    History of Present Illness Patient is a 50 year old female, with a history of CVA, diabetes, fibromyalgia, who comes in secondary to right buttock pain and abdominal pain.   Pt s/p I and D x2 of non viable tissues and drainage of abscess.    PT Comments    Pt supine in bed on arrival this session.  Pt eager to mobilize and getting closer to baseline mobility.  Minor balance impairment remains and limitations of pain are still present.  Pt tolerated progression to stair training with good outcome.    Follow Up Recommendations  No PT follow up     Equipment Recommendations  Rolling walker with 5" wheels    Recommendations for Other Services       Precautions / Restrictions Precautions Precautions: Fall Restrictions Weight Bearing Restrictions: No    Mobility  Bed Mobility Overal bed mobility: Modified Independent                Transfers Overall transfer level: Modified independent Equipment used: Rolling walker (2 wheeled) Transfers: Sit to/from Stand Sit to Stand: Modified independent (Device/Increase time)            Ambulation/Gait Ambulation/Gait assistance: Supervision Gait Distance (Feet): 300 Feet Assistive device: Rolling walker (2 wheeled) Gait Pattern/deviations: Step-through pattern;Trunk flexed     General Gait Details: Cues to relax shoulder and extend trunk and head to improve posture.  Minor drift noted with using no AD in room.   Stairs Stairs: Yes Stairs assistance: Supervision Stair Management: One rail Right Number of Stairs: 4 General stair comments: Cues for sequencing and safety.   Wheelchair Mobility    Modified Rankin (Stroke Patients Only)       Balance Overall balance assessment: Needs assistance Sitting-balance support: No upper extremity supported;Feet supported Sitting balance-Leahy  Scale: Good       Standing balance-Leahy Scale: Fair                              Cognition Arousal/Alertness: Awake/alert Behavior During Therapy: WFL for tasks assessed/performed Overall Cognitive Status: Within Functional Limits for tasks assessed Area of Impairment: Attention;Safety/judgement;Awareness;Problem solving                   Current Attention Level: Sustained     Safety/Judgement: Decreased awareness of safety Awareness: Emergent Problem Solving: Slow processing General Comments: impulsive at times      Exercises      General Comments        Pertinent Vitals/Pain Pain Assessment: Faces Faces Pain Scale: Hurts little more Pain Location: buttocks, back and L foot ( from fibromyalgia ) Pain Descriptors / Indicators: Grimacing;Discomfort Pain Intervention(s): Monitored during session;Repositioned    Home Living                      Prior Function            PT Goals (current goals can now be found in the care plan section) Acute Rehab PT Goals Patient Stated Goal: get this pain and swelling down PT Goal Formulation: With patient Potential to Achieve Goals: Good Progress towards PT goals: Progressing toward goals    Frequency    Min 3X/week      PT Plan Current plan remains appropriate    Co-evaluation  AM-PAC PT "6 Clicks" Mobility   Outcome Measure  Help needed turning from your back to your side while in a flat bed without using bedrails?: None Help needed moving from lying on your back to sitting on the side of a flat bed without using bedrails?: None Help needed moving to and from a bed to a chair (including a wheelchair)?: A Little Help needed standing up from a chair using your arms (e.g., wheelchair or bedside chair)?: A Little Help needed to walk in hospital room?: A Little Help needed climbing 3-5 steps with a railing? : A Little 6 Click Score: 20    End of Session Equipment  Utilized During Treatment: Gait belt Activity Tolerance: Patient tolerated treatment well Patient left: in bed;with call bell/phone within reach Nurse Communication: Mobility status PT Visit Diagnosis: Other abnormalities of gait and mobility (R26.89);Pain;Difficulty in walking, not elsewhere classified (R26.2) Pain - Right/Left: Right Pain - part of body:  (buttock)     Time: 7846-9629 PT Time Calculation (min) (ACUTE ONLY): 18 min  Charges:  $Gait Training: 8-22 mins                     Bonney Leitz , PTA Acute Rehabilitation Services Pager [phone] Office [phone]     Ether Goebel Artis Delay 06/06/2020, 2:57 PM

## 2020-06-06 NOTE — Progress Notes (Signed)
PROGRESS NOTE    Kristie Tapia  SKA:[id number] DOB: [birth year] DOA: 05/23/2020 PCP: System, Provider Not In   Chief Complaint  Patient presents with  . Abdominal Pain    Brief Narrative:  Kristie Lowdermilkis an 50 y.o.femalewith pmh uncontrolled diabetes mellitus type 2, CVA, OSA, and chronic pain who was admitted on 12/20 for necrotizing fasciitis of the R buttock.She is awake and alert. She reported that she started with Kristie Tapia boil on her buttock and tried to pop it. It spread and she tried to lance it without success. She as having abdominal pain as well and went to urgent care and eventually had Nicholos Aloisi CT showing severe infection and so came in and had debridement.   Assessment & Plan:   Principal Problem:   Necrotizing soft tissue infection Active Problems:   Dyslipidemia   Diabetes (HCC)   Smoker unmotivated to quit   CVA (cerebral vascular accident) (HCC)   Obesity   Chronic musculoskeletal pain   Necrotizing soft tissue infection of buttock  -Status post I&D on 12/20 and 12/22  - wound care per surgery, hydrotherapy -WBC still elevated at 16.1 -Cultures positive for Prevotella (+beta lactamase) and gardnerella vaginalis -Initially started on vancomycin, cefepime, and Flagyl . Antibiotics switched to Zosyn per ID on 12/29.  Recommending at least 21 days abx total (stop date will depend on wound healing per ID).   Diabetes mellitus type 2 (uncontrolled) -Patient admits to poor adherence to diabetic regimen at home -Hemoglobin A1c 8.9 -Hold Actos -Continue Lantus 22 units daily - mealtime -Continue CBGs with meals with moderate SSI -Appreciate diabetic education and adjust regimen as needed   Normocytic anemia: Stable.   -Continue to monitor   Enterococcus UTI:  linezolid x 5 days per ID   Hypoalbuminemia/moderate protein calorie malnutrition: Acute.  Albumin was noted to be low at 2.7 on 12/20.  Suspect hypoalbuminemia and recent surgical procedures  likely leading to third spacing as cause for swelling. -prealbumin 10.3 -Glucerna in between meals -Continue using Lasix as needed to help with fluid   Diastolic dysfunction -Last EF noted to be 55 to 60% with grade 1 diastolic dysfunction in 2019 -Patient with 2+ pitting edema in the lower extremies generalized -Strict intake and output -Daily weights -Check BNP 329 -creatinine rising, hold lasix today   Superficial Venous Thrombosis  Bilateral upper extremity cephalic vein superficial vein thrombosis    History of CVA -continue plavix   Depression and anxiety -Continue current regimen   Early onset dementia -Continue Aricept   GERD  PPI BID   PICC in place -2/2 poor access, follow   DVT prophylaxis: lovenox Code Status: full  Family Communication: daughter at bedside Disposition:   Status is: Inpatient  Remains inpatient appropriate because:Inpatient level of care appropriate due to severity of illness   Dispo: The patient is from: Home              Anticipated d/c is to: pending              Anticipated d/c date is: > 3 days              Patient currently is not medically stable to d/c.   Consultants:   [facility] consulting  ID  General surgery is primary  Procedures:  I&D x 2  Antimicrobials:  Anti-infectives (From admission, onward)   Start     Dose/Rate Route Frequency Ordered Stop   06/02/20 1545  linezolid (ZYVOX) tablet 600 mg  600 mg Oral Every 12 hours 06/02/20 1452 06/05/20 2133   06/01/20 1400  piperacillin-tazobactam (ZOSYN) IVPB 3.375 g        3.375 g 12.5 mL/hr over 240 Minutes Intravenous Every 8 hours 06/01/20 0945     05/26/20 1400  metroNIDAZOLE (FLAGYL) tablet 500 mg  Status:  Discontinued        500 mg Oral Every 8 hours 05/26/20 0853 06/01/20 0939   05/25/20 2200  vancomycin (VANCOCIN) IVPB 1000 mg/200 mL premix  Status:  Discontinued        1,000 mg 200 mL/hr over 60 Minutes Intravenous Every 12 hours 05/25/20  0918 05/30/20 1129   05/24/20 1800  vancomycin (VANCOREADY) IVPB 750 mg/150 mL  Status:  Discontinued        750 mg 150 mL/hr over 60 Minutes Intravenous Every 24 hours 05/23/20 1914 05/24/20 0811   05/24/20 1400  ceFEPIme (MAXIPIME) 2 g in sodium chloride 0.9 % 100 mL IVPB  Status:  Discontinued        2 g 200 mL/hr over 30 Minutes Intravenous Every 8 hours 05/24/20 0811 06/01/20 0939   05/24/20 0900  vancomycin (VANCOCIN) IVPB 1000 mg/200 mL premix  Status:  Discontinued        1,000 mg 200 mL/hr over 60 Minutes Intravenous Every 12 hours 05/24/20 0811 05/25/20 0918   05/24/20 0800  ceFEPIme (MAXIPIME) 2 g in sodium chloride 0.9 % 100 mL IVPB  Status:  Discontinued        2 g 200 mL/hr over 30 Minutes Intravenous Every 12 hours 05/23/20 1914 05/24/20 0811   05/23/20 2200  clindamycin (CLEOCIN) IVPB 600 mg  Status:  Discontinued        600 mg 100 mL/hr over 30 Minutes Intravenous Every 8 hours 05/23/20 2129 05/26/20 0853   05/23/20 1830  vancomycin (VANCOREADY) IVPB 1500 mg/300 mL        1,500 mg 150 mL/hr over 120 Minutes Intravenous  Once 05/23/20 1813 05/23/20 2229   05/23/20 1800  ceFEPIme (MAXIPIME) 2 g in sodium chloride 0.9 % 100 mL IVPB        2 g 200 mL/hr over 30 Minutes Intravenous  Once 05/23/20 1750 05/23/20 1934   05/23/20 1800  metroNIDAZOLE (FLAGYL) IVPB 500 mg        500 mg 100 mL/hr over 60 Minutes Intravenous  Once 05/23/20 1750 05/23/20 2027   05/23/20 1800  vancomycin (VANCOCIN) IVPB 1000 mg/200 mL premix  Status:  Discontinued        1,000 mg 200 mL/hr over 60 Minutes Intravenous  Once 05/23/20 1750 05/23/20 1813      Subjective: No new complaints  Objective: Vitals:   06/05/20 2022 06/06/20 0450 06/06/20 0759 06/06/20 1527  BP: (!) 101/58 (!) 108/59 (!) 111/58 111/63  Pulse: 73 72 64 66  Resp: 18 18 17 17   Temp: 98.9 F (37.2 C) 98.4 F (36.9 C) 98.1 F (36.7 C) 98.6 F (37 C)  TempSrc: Oral Oral Oral Oral  SpO2: 99% 95% 91% 97%  Weight:       Height:       No intake or output data in the 24 hours ending 06/06/20 1737 Filed Weights   05/23/20 1542 05/24/20 0200  Weight: 87.1 kg 92.1 kg    Examination:  General: No acute distress. Sitting up on edge of bd. Cardiovascular: Heart sounds show Meighan Treto regular rate, and rhythm Lungs: Clear to auscultation bilaterally  Abdomen: Soft, nontender, nondistended  Neurological: Alert and oriented  3. Moves all extremities 4. Cranial nerves II through XII grossly intact. Skin: wound not examined today, just had hydrotherapy Extremities: No clubbing or cyanosis. No edema.       Data Reviewed: I have personally reviewed following labs and imaging studies  CBC: Recent Labs  Lab 06/02/20 0220 06/03/20 0203 06/04/20 0345 06/05/20 0418 06/06/20 0425  WBC 20.2* 20.3* 17.8* 17.7* 16.1*  NEUTROABS  --   --  13.6* 13.6* 12.3*  HGB 11.0* 11.4* 12.1 11.9* 12.4  HCT 32.9* 35.5* 37.2 37.0 38.5  MCV 90.1 91.7 92.5 93.9 93.2  PLT 433* 472* 510* 537* 562*    Basic Metabolic Panel: Recent Labs  Lab 06/02/20 0220 06/03/20 0203 06/04/20 0345 06/05/20 0418 06/06/20 0425  NA 137 135 138 135 136  K 3.3* 4.1 4.0 4.4 4.4  CL 95* 95* 97* 97* 96*  CO2 34* 34* 32 30 28  GLUCOSE 177* 206* 143* 136* 152*  BUN 14 15 11 15 12   CREATININE 1.06* 1.13* 1.13* 1.14* 1.28*  CALCIUM 7.6* 7.8* 8.2* 8.0* 8.3*  MG  --   --  2.1 2.0 2.1  PHOS  --   --  4.4 4.5 5.1*    GFR: Estimated Creatinine Clearance: 59.6 mL/min (Muranda Coye) (by C-G formula based on SCr of 1.28 mg/dL (H)).  Liver Function Tests: Recent Labs  Lab 06/04/20 0345 06/05/20 0418 06/06/20 0425  AST 17 15 17   ALT 10 10 11   ALKPHOS 104 90 106  BILITOT 0.6 0.6 0.8  PROT 6.0* 6.3* 6.8  ALBUMIN 2.2* 2.3* 2.5*    CBG: Recent Labs  Lab 06/06/20 0002 06/06/20 0450 06/06/20 0801 06/06/20 1156 06/06/20 1613  GLUCAP 148* 154* 125* 197* 271*     Recent Results (from the past 240 hour(s))  Culture, Urine     Status: Abnormal    Collection Time: 06/01/20  5:06 PM   Specimen: Urine, Random  Result Value Ref Range Status   Specimen Description URINE, RANDOM  Final   Special Requests   Final    NONE Performed at [facility], [address], [place], [facility] [address]    Culture (Montoya Brandel)  Final    >=100,000 COLONIES/mL VANCOMYCIN RESISTANT ENTEROCOCCUS   Report Status 06/03/2020 FINAL  Final   Organism ID, Bacteria VANCOMYCIN RESISTANT ENTEROCOCCUS (Georgia Baria)  Final      Susceptibility   Vancomycin resistant enterococcus - MIC*    AMPICILLIN >=32 RESISTANT Resistant     NITROFURANTOIN 32 SENSITIVE Sensitive     VANCOMYCIN >=32 RESISTANT Resistant     LINEZOLID 2 SENSITIVE Sensitive     * >=100,000 COLONIES/mL VANCOMYCIN RESISTANT ENTEROCOCCUS         Radiology Studies: No results found.      Scheduled Meds: . acetic acid   Topical TID  . Chlorhexidine Gluconate Cloth  6 each Topical Daily  . clopidogrel  75 mg Oral Daily  . donepezil  10 mg Oral Daily  . enoxaparin (LOVENOX) injection  40 mg Subcutaneous Q24H  . escitalopram  20 mg Oral QHS  . famotidine  40 mg Oral QHS  . feeding supplement  237 mL Oral QHS  . feeding supplement (GLUCERNA SHAKE)  237 mL Oral TID BM  . insulin aspart  0-15 Units Subcutaneous TID WC & HS  . [START ON 06/07/2020] insulin aspart  7 Units Subcutaneous TID WC  . insulin glargine  22 Units Subcutaneous Daily  . multivitamin with minerals  1 tablet Oral Daily  . nutrition  supplement (JUVEN)  1 packet Oral BID BM  . pantoprazole  40 mg Oral BID  . polycarbophil  625 mg Oral Daily  . pregabalin  100 mg Oral q AM  . pregabalin  200 mg Oral QHS  . rosuvastatin  20 mg Oral QHS  . saccharomyces boulardii  250 mg Oral BID  . sodium chloride flush  10-40 mL Intracatheter Q12H  . sodium hypochlorite   Irrigation TID  . traZODone  150 mg Oral QHS   Continuous Infusions: . piperacillin-tazobactam (ZOSYN)  IV 3.375 g (06/06/20 1207)     LOS: 14 days    Time spent: over 56  min    Fayrene Helper, MD [facility]   To contact the attending provider between 7A-7P or the covering provider during after hours 7P-7A, please log into the web site [email or web address] and access using universal [place] password for that web site. If you do not have the password, please call the hospital operator.  06/06/2020, 5:37 PM

## 2020-06-06 NOTE — Progress Notes (Signed)
Physical Therapy Wound Treatment Patient Details  Name: Kristie Tapia MRN: [id number] Date of Birth: 1970/09/09  Today's Date: 06/06/2020 Time: 1020-1056 Time Calculation (min): 36 min  Subjective  Subjective: Pleasant and agreeable to hydrotherapy Patient and Family Stated Goals: Avoid another surgery Date of Onset: 05/23/20 Prior Treatments: I&D in OR x 2  Pain Score: Pt premedicated and did not appear to be in pain throughout session.   Wound Assessment  Wound / Incision (Open or Dehisced) 05/24/20 Incision - Dehisced Buttocks Right (Active)  Wound Image   06/06/20 1228  Dressing Type ABD;Barrier Film (skin prep);Gauze (Comment);Moist to dry 06/06/20 1228  Dressing Changed Changed 06/06/20 1228  Dressing Status Clean;Dry;Intact 06/06/20 1228  Dressing Change Frequency Daily 06/06/20 1228  Site / Wound Assessment Yellow;Pink;Painful;Bleeding;Brown 06/06/20 1228  % Wound base Red or Granulating 20% 06/06/20 1228  % Wound base Yellow/Fibrinous Exudate 75% 06/06/20 1228  % Wound base Black/Eschar 5% 06/06/20 1228  % Wound base Other/Granulation Tissue (Comment) 0% 06/06/20 1228  Peri-wound Assessment Erythema (blanchable);Excoriated 06/06/20 1228  Wound Length (cm) 9.8 cm 06/06/20 1000  Wound Width (cm) 6 cm 06/06/20 1000  Wound Depth (cm) 10.1 cm 06/06/20 1000  Wound Volume (cm^3) 593.88 cm^3 06/06/20 1000  Wound Surface Area (cm^2) 58.8 cm^2 06/06/20 1000  Tunneling (cm) 5 at 12:00, 4.5v at 1:00-2:00 06/06/20 1000  Margins Unattached edges (unapproximated) 06/06/20 1228  Closure None 06/06/20 1228  Drainage Amount Minimal 06/06/20 1228  Drainage Description Serosanguineous 06/06/20 1228  Treatment Debridement (Selective);Hydrotherapy (Pulse lavage);Packing (Dakin's soaked gauze) 06/06/20 1228   Hydrotherapy Pulsed lavage therapy - wound location: rt buttock Pulsed Lavage with Suction (psi): 12 psi Pulsed Lavage with Suction - Normal Saline Used: 1000 mL Pulsed Lavage  Tip: Tip with splash shield Selective Debridement Selective Debridement - Location: rt buttock Selective Debridement - Tools Used: Scissors;Forceps;Scalpel Selective Debridement - Tissue Removed: yellow unviable tissue   Wound Assessment and Plan  Wound Therapy - Assess/Plan/Recommendations Wound Therapy - Clinical Statement: No dressing on wound upon arrival. Pt reports she has been using mesh briefs to keep dressing on instead of tape due to skin irritation. Noted periwound red, hot to the touch, and with scabbed areas that appear like scratches. Pt reports she has not been scratching area, however was noted to be reaching back and touching the area several times throughout session. She was reminded not to touch her wound or periwound to avoid contaminating the wound. Periwound area was "crusted" with skin prep (barrier film) and anti-fungal powder. Debridement of the buttock wound is progressing with some bleeding noted during debridement, and visible pink healthy tissue emerging through necrotic adipose. This patient will benefit from continued hydrotherapy for selective removal of unviable tissue, to decrease bioburden, and promote wound bed healing. Wound Therapy - Functional Problem List: Open, painful wound and inadvisable to sit for prolonged periods due to wound location Factors Delaying/Impairing Wound Healing: Infection - systemic/local;Diabetes Mellitus Hydrotherapy Plan: Debridement;Dressing change;Patient/family education;Pulsatile lavage with suction Wound Therapy - Frequency: 6X / week Wound Therapy - Follow Up Recommendations: Home health RN Wound Plan: see above  Wound Therapy Goals- Improve the function of patient's integumentary system by progressing the wound(s) through the phases of wound healing (inflammation - proliferation - remodeling) by: Decrease Necrotic Tissue to: <40% Increase Granulation Tissue to: >40% Decrease Length/Width/Depth by (cm): 0/0/1.0cm Improve  Drainage Characteristics: Min;Serous Patient/Family will be able to : verbalize wound dressing application to instruct future caregivers Goals/treatment plan/discharge plan were made with and agreed upon by patient/family:  Yes Time For Goal Achievement: 7 days Wound Therapy - Potential for Goals: Fair  Goals will be updated until maximal potential achieved or discharge criteria met.  Discharge criteria: when goals achieved, discharge from hospital, MD decision/surgical intervention, no progress towards goals, refusal/missing three consecutive treatments without notification or medical reason.  GP     Thelma Comp 06/06/2020, 12:46 PM  Rolinda Roan, PT, DPT Acute Rehabilitation Services Pager: [phone] Office: [phone]

## 2020-06-07 LAB — CBC WITH DIFFERENTIAL/PLATELET
Abs Immature Granulocytes: 0.13 10*3/uL — ABNORMAL HIGH (ref 0.00–0.07)
Basophils Absolute: 0.1 10*3/uL (ref 0.0–0.1)
Basophils Relative: 1 %
Eosinophils Absolute: 0.4 10*3/uL (ref 0.0–0.5)
Eosinophils Relative: 3 %
HCT: 36.7 % (ref 36.0–46.0)
Hemoglobin: 12.1 g/dL (ref 12.0–15.0)
Immature Granulocytes: 1 %
Lymphocytes Relative: 14 %
Lymphs Abs: 1.9 10*3/uL (ref 0.7–4.0)
MCH: 30.8 pg (ref 26.0–34.0)
MCHC: 33 g/dL (ref 30.0–36.0)
MCV: 93.4 fL (ref 80.0–100.0)
Monocytes Absolute: 0.9 10*3/uL (ref 0.1–1.0)
Monocytes Relative: 6 %
Neutro Abs: 10.6 10*3/uL — ABNORMAL HIGH (ref 1.7–7.7)
Neutrophils Relative %: 75 %
Platelets: 565 10*3/uL — ABNORMAL HIGH (ref 150–400)
RBC: 3.93 MIL/uL (ref 3.87–5.11)
RDW: 16.1 % — ABNORMAL HIGH (ref 11.5–15.5)
WBC: 14 10*3/uL — ABNORMAL HIGH (ref 4.0–10.5)
nRBC: 0 % (ref 0.0–0.2)

## 2020-06-07 LAB — PHOSPHORUS: Phosphorus: 4.2 mg/dL (ref 2.5–4.6)

## 2020-06-07 LAB — COMPREHENSIVE METABOLIC PANEL
ALT: 10 U/L (ref 0–44)
AST: 14 U/L — ABNORMAL LOW (ref 15–41)
Albumin: 2.5 g/dL — ABNORMAL LOW (ref 3.5–5.0)
Alkaline Phosphatase: 97 U/L (ref 38–126)
Anion gap: 8 (ref 5–15)
BUN: 11 mg/dL (ref 6–20)
CO2: 27 mmol/L (ref 22–32)
Calcium: 8.2 mg/dL — ABNORMAL LOW (ref 8.9–10.3)
Chloride: 98 mmol/L (ref 98–111)
Creatinine, Ser: 1.16 mg/dL — ABNORMAL HIGH (ref 0.44–1.00)
GFR, Estimated: 58 mL/min — ABNORMAL LOW (ref >60)
Glucose, Bld: 139 mg/dL — ABNORMAL HIGH (ref 70–99)
Potassium: 4.6 mmol/L (ref 3.5–5.1)
Sodium: 133 mmol/L — ABNORMAL LOW (ref 135–145)
Total Bilirubin: 0.5 mg/dL (ref 0.3–1.2)
Total Protein: 6.7 g/dL (ref 6.5–8.1)

## 2020-06-07 LAB — GLUCOSE, CAPILLARY
Glucose-Capillary: 129 mg/dL — ABNORMAL HIGH (ref 70–99)
Glucose-Capillary: 141 mg/dL — ABNORMAL HIGH (ref 70–99)
Glucose-Capillary: 154 mg/dL — ABNORMAL HIGH (ref 70–99)
Glucose-Capillary: 185 mg/dL — ABNORMAL HIGH (ref 70–99)

## 2020-06-07 LAB — MAGNESIUM: Magnesium: 2.1 mg/dL (ref 1.7–2.4)

## 2020-06-07 MED ORDER — LINEZOLID 600 MG PO TABS
600.0000 mg | ORAL_TABLET | Freq: Two times a day (BID) | ORAL | Status: DC
  Administered 2020-06-08: 600 mg via ORAL
  Filled 2020-06-07 (×2): qty 1

## 2020-06-07 MED ORDER — GLUCERNA SHAKE PO LIQD
237.0000 mL | Freq: Two times a day (BID) | ORAL | Status: DC
  Administered 2020-06-09 – 2020-06-10 (×2): 237 mL via ORAL

## 2020-06-07 MED ORDER — FUROSEMIDE 20 MG PO TABS
20.0000 mg | ORAL_TABLET | Freq: Every day | ORAL | Status: DC
  Administered 2020-06-08 – 2020-06-10 (×3): 20 mg via ORAL
  Filled 2020-06-07 (×3): qty 1

## 2020-06-07 MED ORDER — ENSURE MAX PROTEIN PO LIQD
11.0000 [oz_av] | Freq: Every day | ORAL | Status: DC
  Administered 2020-06-07 – 2020-06-08 (×2): 11 [oz_av] via ORAL

## 2020-06-07 NOTE — Progress Notes (Signed)
Consult NOTE    Kristie Tapia  VQM:[id number] DOB: 1970/08/02 DOA: 05/23/2020 PCP: System, Provider Not In   Chief Complaint  Patient presents with  . Abdominal Pain    Brief Narrative:  Kristie Lowdermilkis an 50 y.o.femalewith pmh uncontrolled diabetes mellitus type 2, CVA, OSA, and chronic pain who was admitted on 12/20 for necrotizing fasciitis of the R buttock.She is awake and alert. She reported that she started with Kristie Tapia boil on her buttock and tried to pop it. It spread and she tried to lance it without success. She as having abdominal pain as well and went to urgent care and eventually had Kristie Tapia CT showing severe infection and so came in and had debridement.   Hospitalist were consulted for assistance with medical management.  Currently improving, likely to d/c towards the end of the week.  Assessment & Plan:   Principal Problem:   Necrotizing soft tissue infection Active Problems:   Dyslipidemia   Diabetes (HCC)   Smoker unmotivated to quit   CVA (cerebral vascular accident) (HCC)   Obesity   Chronic musculoskeletal pain   Necrotizing soft tissue infection of buttock  -Status post I&D on 12/20 and 12/22  - wound care per surgery, hydrotherapy -WBC still elevated at 14 -> downtrending -Cultures positive for Prevotella (+beta lactamase) and gardnerella vaginalis -Initially started on vancomycin, cefepime, and Flagyl . Antibiotics switched to Zosyn per ID on 12/29.  Recommending at least 21 days abx total (stop date will depend on wound healing per ID).   Diabetes mellitus type 2 (uncontrolled) -Patient admits to poor adherence to diabetic regimen at home -BG's appropriate today -Hemoglobin A1c 8.9 -Hold Actos -Continue Lantus 22 units daily - 7 units aspart TID with meals -Continue CBGs with meals with moderate SSI -Appreciate diabetic education and adjust regimen as needed   Normocytic anemia: Stable.   -Continue to monitor   Vanc Resistant  Enterococcus UTI:  linezolid x 5 days per ID (received 4 days, will order 1 more day)   Hypoalbuminemia/moderate protein calorie malnutrition: Acute.  Albumin was noted to be low at 2.7 on 12/20.  Suspect hypoalbuminemia and recent surgical procedures likely leading to third spacing as cause for swelling. -prealbumin 10.3 -Glucerna in between meals -Continue using Lasix as needed to help with fluid   Diastolic dysfunction -Last EF noted to be 55 to 60% with grade 1 diastolic dysfunction in 2019 -Patient with 2+ pitting edema in the lower extremies generalized -> improved -Strict intake and output -Daily weights -Check BNP 329 -resume home lasix dose   Superficial Venous Thrombosis  Bilateral upper extremity cephalic vein superficial vein thrombosis    History of CVA -continue plavix   Depression and anxiety -Continue current regimen   Early onset dementia -Continue Aricept   GERD  PPI BID   PICC in place -2/2 poor access, follow   DVT prophylaxis: lovenox Code Status: full  Family Communication: daughter over phone (she placed her daughter on speaker) Disposition:   Status is: Inpatient  Remains inpatient appropriate because:Inpatient level of care appropriate due to severity of illness   Dispo: The patient is from: Home              Anticipated d/c is to: pending              Anticipated d/c date is: > 3 days              Patient currently is not medically stable to d/c.   Consultants:  [facility] consulting  ID  General surgery is primary  Procedures:  I&D x 2  Antimicrobials:  Anti-infectives (From admission, onward)   Start     Dose/Rate Route Frequency Ordered Stop   06/02/20 1545  linezolid (ZYVOX) tablet 600 mg        600 mg Oral Every 12 hours 06/02/20 1452 06/05/20 2133   06/01/20 1400  piperacillin-tazobactam (ZOSYN) IVPB 3.375 g        3.375 g 12.5 mL/hr over 240 Minutes Intravenous Every 8 hours 06/01/20 0945     05/26/20 1400   metroNIDAZOLE (FLAGYL) tablet 500 mg  Status:  Discontinued        500 mg Oral Every 8 hours 05/26/20 0853 06/01/20 0939   05/25/20 2200  vancomycin (VANCOCIN) IVPB 1000 mg/200 mL premix  Status:  Discontinued        1,000 mg 200 mL/hr over 60 Minutes Intravenous Every 12 hours 05/25/20 0918 05/30/20 1129   05/24/20 1800  vancomycin (VANCOREADY) IVPB 750 mg/150 mL  Status:  Discontinued        750 mg 150 mL/hr over 60 Minutes Intravenous Every 24 hours 05/23/20 1914 05/24/20 0811   05/24/20 1400  ceFEPIme (MAXIPIME) 2 g in sodium chloride 0.9 % 100 mL IVPB  Status:  Discontinued        2 g 200 mL/hr over 30 Minutes Intravenous Every 8 hours 05/24/20 0811 06/01/20 0939   05/24/20 0900  vancomycin (VANCOCIN) IVPB 1000 mg/200 mL premix  Status:  Discontinued        1,000 mg 200 mL/hr over 60 Minutes Intravenous Every 12 hours 05/24/20 0811 05/25/20 0918   05/24/20 0800  ceFEPIme (MAXIPIME) 2 g in sodium chloride 0.9 % 100 mL IVPB  Status:  Discontinued        2 g 200 mL/hr over 30 Minutes Intravenous Every 12 hours 05/23/20 1914 05/24/20 0811   05/23/20 2200  clindamycin (CLEOCIN) IVPB 600 mg  Status:  Discontinued        600 mg 100 mL/hr over 30 Minutes Intravenous Every 8 hours 05/23/20 2129 05/26/20 0853   05/23/20 1830  vancomycin (VANCOREADY) IVPB 1500 mg/300 mL        1,500 mg 150 mL/hr over 120 Minutes Intravenous  Once 05/23/20 1813 05/23/20 2229   05/23/20 1800  ceFEPIme (MAXIPIME) 2 g in sodium chloride 0.9 % 100 mL IVPB        2 g 200 mL/hr over 30 Minutes Intravenous  Once 05/23/20 1750 05/23/20 1934   05/23/20 1800  metroNIDAZOLE (FLAGYL) IVPB 500 mg        500 mg 100 mL/hr over 60 Minutes Intravenous  Once 05/23/20 1750 05/23/20 2027   05/23/20 1800  vancomycin (VANCOCIN) IVPB 1000 mg/200 mL premix  Status:  Discontinued        1,000 mg 200 mL/hr over 60 Minutes Intravenous  Once 05/23/20 1750 05/23/20 1813      Subjective: No new complaints today askign about eventual  wound care at home (discussed should talk to surgery about that)  Objective: Vitals:   06/06/20 1940 06/07/20 0534 06/07/20 0737 06/07/20 1505  BP: (!) 120/56 114/61 117/60 116/60  Pulse: 69 68 71 63  Resp: 16  17 17   Temp: 98.7 F (37.1 C) 98.4 F (36.9 C) 98.6 F (37 C) 98.7 F (37.1 C)  TempSrc: Oral Oral Oral Oral  SpO2: 95% 99% 92% 95%  Weight:      Height:       No  intake or output data in the 24 hours ending 06/07/20 1601 Filed Weights   05/23/20 1542 05/24/20 0200  Weight: 87.1 kg 92.1 kg    Examination:  General: No acute distress. Cardiovascular: RRR  Lungs: unlabored Abdomen: Soft, nontender, nondistended  Neurological: Alert and oriented 3. Moves all extremities 4. Cranial nerves II through XII grossly intact. Skin: gluteal wound, packed Extremities: No clubbing or cyanosis. Trace edema.       Data Reviewed: I have personally reviewed following labs and imaging studies  CBC: Recent Labs  Lab 06/03/20 0203 06/04/20 0345 06/05/20 0418 06/06/20 0425 06/07/20 0412  WBC 20.3* 17.8* 17.7* 16.1* 14.0*  NEUTROABS  --  13.6* 13.6* 12.3* 10.6*  HGB 11.4* 12.1 11.9* 12.4 12.1  HCT 35.5* 37.2 37.0 38.5 36.7  MCV 91.7 92.5 93.9 93.2 93.4  PLT 472* 510* 537* 562* 565*    Basic Metabolic Panel: Recent Labs  Lab 06/03/20 0203 06/04/20 0345 06/05/20 0418 06/06/20 0425 06/07/20 0412  NA 135 138 135 136 133*  K 4.1 4.0 4.4 4.4 4.6  CL 95* 97* 97* 96* 98  CO2 34* 32 30 28 27   GLUCOSE 206* 143* 136* 152* 139*  BUN 15 11 15 12 11   CREATININE 1.13* 1.13* 1.14* 1.28* 1.16*  CALCIUM 7.8* 8.2* 8.0* 8.3* 8.2*  MG  --  2.1 2.0 2.1 2.1  PHOS  --  4.4 4.5 5.1* 4.2    GFR: Estimated Creatinine Clearance: 65.8 mL/min (Jameah Rouser) (by C-G formula based on SCr of 1.16 mg/dL (H)).  Liver Function Tests: Recent Labs  Lab 06/04/20 0345 06/05/20 0418 06/06/20 0425 06/07/20 0412  AST 17 15 17  14*  ALT 10 10 11 10   ALKPHOS 104 90 106 97  BILITOT 0.6 0.6 0.8 0.5   PROT 6.0* 6.3* 6.8 6.7  ALBUMIN 2.2* 2.3* 2.5* 2.5*    CBG: Recent Labs  Lab 06/06/20 1613 06/06/20 1940 06/07/20 0003 06/07/20 0656 06/07/20 0804  GLUCAP 271* 196* 185* 154* 141*     Recent Results (from the past 240 hour(s))  Culture, Urine     Status: Abnormal   Collection Time: 06/01/20  5:06 PM   Specimen: Urine, Random  Result Value Ref Range Status   Specimen Description URINE, RANDOM  Final   Special Requests   Final    NONE Performed at [facility], [address], [place], 06/03/20 [facility]    Culture (Marguriete Wootan)  Final    >=100,000 COLONIES/mL VANCOMYCIN RESISTANT ENTEROCOCCUS   Report Status 06/03/2020 FINAL  Final   Organism ID, Bacteria VANCOMYCIN RESISTANT ENTEROCOCCUS (Travoris Bushey)  Final      Susceptibility   Vancomycin resistant enterococcus - MIC*    AMPICILLIN >=32 RESISTANT Resistant     NITROFURANTOIN 32 SENSITIVE Sensitive     VANCOMYCIN >=32 RESISTANT Resistant     LINEZOLID 2 SENSITIVE Sensitive     * >=100,000 COLONIES/mL VANCOMYCIN RESISTANT ENTEROCOCCUS         Radiology Studies: No results found.      Scheduled Meds: . Chlorhexidine Gluconate Cloth  6 each Topical Daily  . clopidogrel  75 mg Oral Daily  . donepezil  10 mg Oral Daily  . enoxaparin (LOVENOX) injection  40 mg Subcutaneous Q24H  . escitalopram  20 mg Oral QHS  . famotidine  40 mg Oral QHS  . [START ON 06/08/2020] feeding supplement (GLUCERNA SHAKE)  237 mL Oral BID BM  . insulin aspart  0-15 Units Subcutaneous TID WC & HS  . insulin  aspart  7 Units Subcutaneous TID WC  . insulin glargine  22 Units Subcutaneous Daily  . multivitamin with minerals  1 tablet Oral Daily  . pantoprazole  40 mg Oral BID  . polycarbophil  625 mg Oral Daily  . pregabalin  100 mg Oral q AM  . pregabalin  200 mg Oral QHS  . Ensure Max Protein  11 oz Oral QHS  . rosuvastatin  20 mg Oral QHS  . saccharomyces boulardii  250 mg Oral BID  . sodium chloride flush  10-40 mL Intracatheter Q12H  .  traZODone  150 mg Oral QHS   Continuous Infusions: . piperacillin-tazobactam (ZOSYN)  IV 3.375 g (06/07/20 1257)     LOS: 15 days    Time spent: over 30 min    Lacretia Nicks, MD [facility]   To contact the attending provider between 7A-7P or the covering provider during after hours 7P-7A, please log into the web site [email or web address] and access using universal [place] password for that web site. If you do not have the password, please call the hospital operator.  06/07/2020, 4:01 PM

## 2020-06-07 NOTE — Plan of Care (Signed)

## 2020-06-07 NOTE — Progress Notes (Signed)
[facility] Progress Note  13 Days Post-Op  Subjective: CC-  Tolerating dressing changes well. WBC down 14, afebrile  Objective: Vital signs in last 24 hours: Temp:  [98.4 F (36.9 C)-98.7 F (37.1 C)] 98.6 F (37 C) (01/04 0737) Pulse Rate:  [66-71] 71 (01/04 0737) Resp:  [16-17] 17 (01/04 0737) BP: (111-120)/(56-63) 117/60 (01/04 0737) SpO2:  [92 %-99 %] 92 % (01/04 0737) Last BM Date: 06/04/20  Intake/Output from previous day: No intake/output data recorded. Intake/Output this shift: No intake/output data recorded.  PE: Gen:  Alert, NAD Pulm:  rate and effort normal Abd: Soft, NT/ND Ext:  calves soft and nontender, trace BLE edema Psych: A&Ox4  Skin: warm and dry GU: Right buttock wound measuring ~8cm x 6cm x 6cm with6cm tracking cephaladand 7cm towards right hip. No gross discharge after dressing removed. Periwound without induration or cellulitis. There is some granulation tissue at the base but mostly tan subcutaneous tissue     Lab Results:  Recent Labs    06/06/20 0425 06/07/20 0412  WBC 16.1* 14.0*  HGB 12.4 12.1  HCT 38.5 36.7  PLT 562* 565*   BMET Recent Labs    06/06/20 0425 06/07/20 0412  NA 136 133*  K 4.4 4.6  CL 96* 98  CO2 28 27  GLUCOSE 152* 139*  BUN 12 11  CREATININE 1.28* 1.16*  CALCIUM 8.3* 8.2*   PT/INR No results for input(s): LABPROT, INR in the last 72 hours. CMP     Component Value Date/Time   NA 133 (L) 06/07/2020 0412   NA 137 07/06/2017 1442   K 4.6 06/07/2020 0412   CL 98 06/07/2020 0412   CO2 27 06/07/2020 0412   GLUCOSE 139 (H) 06/07/2020 0412   BUN 11 06/07/2020 0412   BUN 12 07/06/2017 1442   CREATININE 1.16 (H) 06/07/2020 0412   CREATININE 0.84 02/02/2016 1517   CALCIUM 8.2 (L) 06/07/2020 0412   PROT 6.7 06/07/2020 0412   ALBUMIN 2.5 (L) 06/07/2020 0412   ALBUMIN 4.4 07/06/2017 1442   AST 14 (L) 06/07/2020 0412   ALT 10 06/07/2020 0412   ALKPHOS 97 06/07/2020 0412   BILITOT 0.5  06/07/2020 0412   GFRNONAA 58 (L) 06/07/2020 0412   GFRNONAA 85 02/02/2016 1517   GFRAA >60 01/20/2019 2216   GFRAA >89 02/02/2016 1517   Lipase     Component Value Date/Time   LIPASE 17 05/23/2020 1549       Studies/Results: No results found.  Anti-infectives: Anti-infectives (From admission, onward)   Start     Dose/Rate Route Frequency Ordered Stop   06/02/20 1545  linezolid (ZYVOX) tablet 600 mg        600 mg Oral Every 12 hours 06/02/20 1452 06/05/20 2133   06/01/20 1400  piperacillin-tazobactam (ZOSYN) IVPB 3.375 g        3.375 g 12.5 mL/hr over 240 Minutes Intravenous Every 8 hours 06/01/20 0945     05/26/20 1400  metroNIDAZOLE (FLAGYL) tablet 500 mg  Status:  Discontinued        500 mg Oral Every 8 hours 05/26/20 0853 06/01/20 0939   05/25/20 2200  vancomycin (VANCOCIN) IVPB 1000 mg/200 mL premix  Status:  Discontinued        1,000 mg 200 mL/hr over 60 Minutes Intravenous Every 12 hours 05/25/20 0918 05/30/20 1129   05/24/20 1800  vancomycin (VANCOREADY) IVPB 750 mg/150 mL  Status:  Discontinued        750 mg 150 mL/hr over  60 Minutes Intravenous Every 24 hours 05/23/20 1914 05/24/20 0811   05/24/20 1400  ceFEPIme (MAXIPIME) 2 g in sodium chloride 0.9 % 100 mL IVPB  Status:  Discontinued        2 g 200 mL/hr over 30 Minutes Intravenous Every 8 hours 05/24/20 0811 06/01/20 0939   05/24/20 0900  vancomycin (VANCOCIN) IVPB 1000 mg/200 mL premix  Status:  Discontinued        1,000 mg 200 mL/hr over 60 Minutes Intravenous Every 12 hours 05/24/20 0811 05/25/20 0918   05/24/20 0800  ceFEPIme (MAXIPIME) 2 g in sodium chloride 0.9 % 100 mL IVPB  Status:  Discontinued        2 g 200 mL/hr over 30 Minutes Intravenous Every 12 hours 05/23/20 1914 05/24/20 0811   05/23/20 2200  clindamycin (CLEOCIN) IVPB 600 mg  Status:  Discontinued        600 mg 100 mL/hr over 30 Minutes Intravenous Every 8 hours 05/23/20 2129 05/26/20 0853   05/23/20 1830  vancomycin (VANCOREADY) IVPB 1500  mg/300 mL        1,500 mg 150 mL/hr over 120 Minutes Intravenous  Once 05/23/20 1813 05/23/20 2229   05/23/20 1800  ceFEPIme (MAXIPIME) 2 g in sodium chloride 0.9 % 100 mL IVPB        2 g 200 mL/hr over 30 Minutes Intravenous  Once 05/23/20 1750 05/23/20 1934   05/23/20 1800  metroNIDAZOLE (FLAGYL) IVPB 500 mg        500 mg 100 mL/hr over 60 Minutes Intravenous  Once 05/23/20 1750 05/23/20 2027   05/23/20 1800  vancomycin (VANCOCIN) IVPB 1000 mg/200 mL premix  Status:  Discontinued        1,000 mg 200 mL/hr over 60 Minutes Intravenous  Once 05/23/20 1750 05/23/20 1813       Assessment/Plan NSTI of the buttock and retroperitoneal abscess - S/P debridement by Dr. Derrell Lolling 12/20&12/22.   T 12/29 without residual abscess, there is a decreased small amount of retroperitoneal gas around the right psoas muscle without evidence of abscess.  - Cx's with Gardnerella vaginalis, few Prevotella species, beta lactamase positive. Appreciate ID assistance. Recommend "Would aim for at least 21 days of anbx total (12/31 is day #10). " - Continue BID wet to dry dressing changes and Hydrotherapy Acute hypoxic ventilator dependent respiratory failure- resolved Hx DM2 -A1c 8.9.SSI. Persistent hyperglycemia but improved. Diabetic coordinator consult. Changes made per their recs. Hx CVA- Plavix restarted 1/1 HLD - crestor Hx of gastric ulcer - Pepcid. Avoid nsaids Mult psych meds - home meds ABL Anemia- hgb stable Obesity BMI 33.79 Enterococcus UTI - completed 5 days linezolid FEN-CM diet ID- cultures polymicrobial gram stain,so far only Gardnerella, cover anaerobes, GPC and GNR.vanc 12/20-12/27/cefepime 12/20-12/29, zosyn 12/29-->  Foley-Out. Voiding VTE-SCDs,Lovenox Dispo- Continue BID wet to dry dressing changes and hydrotherapy. WBC is trending down. If this continues to improve and wound remains stable, may be ready for discharge home by the end of the week with home health  assistance. Patient requesting wound care follow up at [facility].   LOS: 15 days    Franne Forts, [facility] 06/07/2020, 9:25 AM Please see Amion for pager number during day hours 7:00am-4:30pm

## 2020-06-07 NOTE — Progress Notes (Signed)
Physical Therapy Wound Treatment Patient Details  Name: Kristie Tapia MRN: [id number] Date of Birth: 1970-11-25  Today's Date: 06/07/2020 Time: 2119-4174 Time Calculation (min): 39 min  Subjective  Subjective: Pleasant and agreeable to hydrotherapy Patient and Family Stated Goals: Avoid another surgery Date of Onset: 05/23/20 Prior Treatments: I&D in OR x 2  Pain Score: Pain Score: 7   Wound Assessment  Wound / Incision (Open or Dehisced) 05/24/20 Incision - Dehisced Buttocks Right (Active)  Dressing Type ABD;Gauze (Comment);Moist to dry 06/07/20 1359  Dressing Changed Changed 06/07/20 1359  Dressing Status Clean;Dry;Intact 06/07/20 1359  Dressing Change Frequency Daily 06/07/20 1359  Site / Wound Assessment Yellow;Pink;Brown 06/07/20 1359  % Wound base Red or Granulating 20% 06/07/20 1359  % Wound base Yellow/Fibrinous Exudate 75% 06/07/20 1359  % Wound base Black/Eschar 5% 06/07/20 1359  % Wound base Other/Granulation Tissue (Comment) 0% 06/07/20 1359  Peri-wound Assessment Erythema (blanchable);Excoriated 06/07/20 1359  Wound Length (cm) 9.8 cm 06/06/20 1000  Wound Width (cm) 6 cm 06/06/20 1000  Wound Depth (cm) 10.1 cm 06/06/20 1000  Wound Volume (cm^3) 593.88 cm^3 06/06/20 1000  Wound Surface Area (cm^2) 58.8 cm^2 06/06/20 1000  Tunneling (cm) 5 at 12:00, 4.5v at 1:00-2:00 06/06/20 1000  Margins Unattached edges (unapproximated) 06/07/20 1359  Closure None 06/07/20 1359  Drainage Amount Minimal 06/07/20 1359  Drainage Description Purulent 06/07/20 1359  Treatment Debridement (Selective);Hydrotherapy (Pulse lavage);Packing (Saline gauze) 06/07/20 1359   Hydrotherapy Pulsed lavage therapy - wound location: rt buttock Pulsed Lavage with Suction (psi): 12 psi Pulsed Lavage with Suction - Normal Saline Used: 1000 mL Pulsed Lavage Tip: Tip with splash shield Selective Debridement Selective Debridement - Location: rt buttock Selective Debridement - Tools Used:  Forceps;Scalpel Selective Debridement - Tissue Removed: yellow and brown unviable tissue   Wound Assessment and Plan  Wound Therapy - Assess/Plan/Recommendations Wound Therapy - Clinical Statement: Periwound area appears improved today with decreased erythema. Area "crusted" again with barrier film (skin prep) and anti-fungal powder. Debridement of the buttock wound is progressing with some bleeding noted during debridement, and visible pink healthy tissue emerging through necrotic adipose. This patient will benefit from continued hydrotherapy for selective removal of unviable tissue, to decrease bioburden, and promote wound bed healing. Wound Therapy - Functional Problem List: Open, painful wound and inadvisable to sit for prolonged periods due to wound location Factors Delaying/Impairing Wound Healing: Infection - systemic/local;Diabetes Mellitus Hydrotherapy Plan: Debridement;Dressing change;Patient/family education;Pulsatile lavage with suction Wound Therapy - Frequency: 6X / week Wound Therapy - Follow Up Recommendations: Home health RN Wound Plan: see above  Wound Therapy Goals- Improve the function of patient's integumentary system by progressing the wound(s) through the phases of wound healing (inflammation - proliferation - remodeling) by: Decrease Necrotic Tissue to: <40% Decrease Necrotic Tissue - Progress: Progressing toward goal Increase Granulation Tissue to: >40% Increase Granulation Tissue - Progress: Progressing toward goal Decrease Length/Width/Depth by (cm): 0/0/1.0cm Decrease Length/Width/Depth - Progress: Progressing toward goal Improve Drainage Characteristics: Min;Serous Improve Drainage Characteristics - Progress: Progressing toward goal Patient/Family will be able to : verbalize wound dressing application to instruct future caregivers Patient/Family Instruction Goal - Progress: Progressing toward goal Goals/treatment plan/discharge plan were made with and agreed upon  by patient/family: Yes Time For Goal Achievement: 7 days Wound Therapy - Potential for Goals: Fair  Goals will be updated until maximal potential achieved or discharge criteria met.  Discharge criteria: when goals achieved, discharge from hospital, MD decision/surgical intervention, no progress towards goals, refusal/missing three consecutive treatments without notification or  medical reason.  GP     Thelma Comp 06/07/2020, 2:07 PM   Rolinda Roan, PT, DPT Acute Rehabilitation Services Pager: [phone] Office: [phone]

## 2020-06-07 NOTE — Progress Notes (Signed)
Nutrition Follow-up  DOCUMENTATION CODES:   Not applicable  INTERVENTION:   -D/c Ensure Enlive -D/c Juven -Continue Magic cup TID with meals, each supplement provides 290 kcal and 9 grams of protein -Continue MVI with minerals daily -Decrease Glucerna Shake po to BID, each supplement provides 220 kcal and 10 grams of protein -Ensure Max po daily, each supplement provides 150 kcal and 30 grams of protein.   NUTRITION DIAGNOSIS:   Increased nutrient needs related to post-op healing as evidenced by estimated needs.  Ongoing  GOAL:   Patient will meet greater than or equal to 90% of their needs  Progressing   MONITOR:   PO intake,Supplement acceptance  REASON FOR ASSESSMENT:   Rounds    ASSESSMENT:   Pt with PMH of CVA, DM, fibromyalgia and IBS (on lomotil up to QID 12/2019) who is admitted for R buttock pain and abd pain. Per CT pt with necrotizing soft tissue infection to the R buttock, air tracking above the pelvic wall and into the retroperitoneal/perinephric space.  12/29-hydrotherapy initiated  Reviewed I/O's: +12.3 L since 05/24/20  Per MD notes, pt s/p debridement of buttock and retroperitoneal abscess on 05/23/20 and 05/25/20.  Pt unavailable at time of visit.   Pt remains with good appetite. Noted meal completions 50-100%.   Noted pt with multiple supplements ordered (Juven, Glucerna, and Ensure Enlive). Pt is generally taking Ensure and Glucerna, however, refusing Juven.   Per MD notes, plan possible discharge with home health services by the end of the week.   Labs reviewed: Na: 133, CBGS: 154-271 (inpatient orders for glycemic control are 0-15 units insulin aspart TID with meals and bedtime, 7 units insulin aspart TID with meals, and 22 units insulin glargine daily.   Diet Order:   Diet Order            Diet Carb Modified Fluid consistency: Thin; Room service appropriate? Yes  Diet effective now                 EDUCATION NEEDS:   Education  needs have been addressed  Skin:  Skin Assessment: Skin Integrity Issues: Skin Integrity Issues:: Incisions Incisions: rt buttocks- dehisced  Last BM:  06/05/20  Height:   Ht Readings from Last 1 Encounters:  05/23/20 5\' 5"  (1.651 m)    Weight:   Wt Readings from Last 1 Encounters:  05/24/20 92.1 kg    Ideal Body Weight:  56.8 kg  BMI:  Body mass index is 33.79 kg/m.  Estimated Nutritional Needs:   Kcal:  2200-2400  Protein:  115-130 grams  Fluid:  > 2L/day    05/26/20, RD, LDN, CDCES Registered Dietitian II Certified Diabetes Care and Education Specialist Please refer to [facility] for RD and/or RD on-call/weekend/after hours pager

## 2020-06-08 DIAGNOSIS — M7989 Other specified soft tissue disorders: Secondary | ICD-10-CM | POA: Diagnosis not present

## 2020-06-08 DIAGNOSIS — M7918 Myalgia, other site: Secondary | ICD-10-CM

## 2020-06-08 DIAGNOSIS — G8929 Other chronic pain: Secondary | ICD-10-CM

## 2020-06-08 DIAGNOSIS — I639 Cerebral infarction, unspecified: Secondary | ICD-10-CM

## 2020-06-08 DIAGNOSIS — E1142 Type 2 diabetes mellitus with diabetic polyneuropathy: Secondary | ICD-10-CM | POA: Diagnosis not present

## 2020-06-08 DIAGNOSIS — Z794 Long term (current) use of insulin: Secondary | ICD-10-CM | POA: Diagnosis not present

## 2020-06-08 LAB — BASIC METABOLIC PANEL
Anion gap: 11 (ref 5–15)
BUN: 11 mg/dL (ref 6–20)
CO2: 28 mmol/L (ref 22–32)
Calcium: 8.3 mg/dL — ABNORMAL LOW (ref 8.9–10.3)
Chloride: 97 mmol/L — ABNORMAL LOW (ref 98–111)
Creatinine, Ser: 1.23 mg/dL — ABNORMAL HIGH (ref 0.44–1.00)
GFR, Estimated: 54 mL/min — ABNORMAL LOW (ref >60)
Glucose, Bld: 156 mg/dL — ABNORMAL HIGH (ref 70–99)
Potassium: 4.2 mmol/L (ref 3.5–5.1)
Sodium: 136 mmol/L (ref 135–145)

## 2020-06-08 LAB — GLUCOSE, CAPILLARY
Glucose-Capillary: 220 mg/dL — ABNORMAL HIGH (ref 70–99)
Glucose-Capillary: 153 mg/dL — ABNORMAL HIGH (ref 70–99)
Glucose-Capillary: 197 mg/dL — ABNORMAL HIGH (ref 70–99)
Glucose-Capillary: 188 mg/dL — ABNORMAL HIGH (ref 70–99)

## 2020-06-08 LAB — CBC
HCT: 35.9 % — ABNORMAL LOW (ref 36.0–46.0)
Hemoglobin: 11.8 g/dL — ABNORMAL LOW (ref 12.0–15.0)
MCH: 30.9 pg (ref 26.0–34.0)
MCHC: 32.9 g/dL (ref 30.0–36.0)
MCV: 94 fL (ref 80.0–100.0)
Platelets: 502 10*3/uL — ABNORMAL HIGH (ref 150–400)
RBC: 3.82 MIL/uL — ABNORMAL LOW (ref 3.87–5.11)
RDW: 16.3 % — ABNORMAL HIGH (ref 11.5–15.5)
WBC: 12.3 10*3/uL — ABNORMAL HIGH (ref 4.0–10.5)
nRBC: 0 % (ref 0.0–0.2)

## 2020-06-08 MED ORDER — METRONIDAZOLE 500 MG PO TABS
500.0000 mg | ORAL_TABLET | Freq: Two times a day (BID) | ORAL | Status: DC
  Administered 2020-06-08 – 2020-06-10 (×5): 500 mg via ORAL
  Filled 2020-06-08 (×5): qty 1

## 2020-06-08 MED ORDER — CEFDINIR 300 MG PO CAPS
300.0000 mg | ORAL_CAPSULE | Freq: Two times a day (BID) | ORAL | Status: DC
  Administered 2020-06-08 – 2020-06-10 (×5): 300 mg via ORAL
  Filled 2020-06-08 (×6): qty 1

## 2020-06-08 MED ORDER — AMOXICILLIN-POT CLAVULANATE 875-125 MG PO TABS: 1.0000 | ORAL_TABLET | Freq: Two times a day (BID) | ORAL | Status: DC

## 2020-06-08 NOTE — Plan of Care (Signed)
  Problem: Activity: Goal: Risk for activity intolerance will decrease Outcome: Progressing   Problem: Pain Managment: Goal: General experience of comfort will improve Outcome: Progressing   Problem: Skin Integrity: Goal: Risk for impaired skin integrity will decrease Outcome: Progressing   

## 2020-06-08 NOTE — Progress Notes (Signed)
[facility]  Date of Admission:  05/23/2020      Total days of antibiotics 16  Zosyn 7 days   Completed course of linezolid UTI x 3d   Previously course of cefepime + metronidazole          ASSESSMENT: Kristie Tapia is a 50 y.o. female with necrotizing soft tissue wound involving the right buttock/perianal region. Last OR trip 12/22 - she has steadily been improving since changing to zosyn over the last 7 days with near resolution of her elevated WBC count; no fevers.Her wound is very large but is clean appearing based on photos in chart from today. No signs of ongoing infection at this time. She will need long standing care with wound clinic, glycemic control, offloading and attention to nutrition.   Given the depth of the damage would treat for 2 additional weeks with PO antibiotics at D/C. She refused Augmentin due to previous severe vaginitis from this. We discussed other options and will proceed with Cefdinir + Metronidazole BID dosing. She would rather deal with nausea side effects. We also discussed discharge home with PO Diflucan 150 mg PRN x 2 doses to have on hand as well.   It is not clear as to why linezolid was resumed last night as her urinary symptoms resolved - will D/C.   Anticipated discharge at the end of the week. Start PO meds today. I told her she will not need her PICC line (wanted to keep it for home "just in case").  She can follow up with ID as needed outpatient should there be any concerns with her wound care team.   I spoke with her nurse today - she will coordinate for her daughter to come in for dressing care recommendations.    PLAN: 1. Stop zosyn  2. Change to Cefdinir + metronidazole BID to complete 2 additional weeks of antibiotics  3. Stop linezolid  4. Diflucan 150 mg tabs Q 5-7d at home PRN     Principal Problem:   Necrotizing soft tissue infection Active Problems:   Dyslipidemia   Diabetes (HCC)    Smoker unmotivated to quit   CVA (cerebral vascular accident) (HCC)   Obesity   Chronic musculoskeletal pain   . Chlorhexidine Gluconate Cloth  6 each Topical Daily  . clopidogrel  75 mg Oral Daily  . donepezil  10 mg Oral Daily  . enoxaparin (LOVENOX) injection  40 mg Subcutaneous Q24H  . escitalopram  20 mg Oral QHS  . famotidine  40 mg Oral QHS  . feeding supplement (GLUCERNA SHAKE)  237 mL Oral BID BM  . furosemide  20 mg Oral Daily  . insulin aspart  0-15 Units Subcutaneous TID WC & HS  . insulin aspart  7 Units Subcutaneous TID WC  . insulin glargine  22 Units Subcutaneous Daily  . linezolid  600 mg Oral Q12H  . multivitamin with minerals  1 tablet Oral Daily  . pantoprazole  40 mg Oral BID  . polycarbophil  625 mg Oral Daily  . pregabalin  100 mg Oral q AM  . pregabalin  200 mg Oral QHS  . Ensure Max Protein  11 oz Oral QHS  . rosuvastatin  20 mg Oral QHS  . saccharomyces boulardii  250 mg Oral BID  . sodium chloride flush  10-40 mL Intracatheter Q12H  . traZODone  150 mg Oral QHS    SUBJECTIVE: Doing well. Hopeful to go  home. Needs to have her daughter come in for teaching re: dressing care.   Absolutely will not take Augmentin - she had a very traumatizing yeast infection in the past while on this and does not want that to cause trouble with the location of her current wound.   Wants to keep her PICC line "just in case."    Review of Systems: Review of Systems  Constitutional: Negative for chills, fever, malaise/fatigue and weight loss.  HENT: Negative for tinnitus.   Eyes: Negative for blurred vision and photophobia.  Respiratory: Negative for cough, sputum production and shortness of breath.   Cardiovascular: Negative for chest pain.  Gastrointestinal: Negative for abdominal pain, diarrhea, nausea and vomiting.  Genitourinary: Negative for dysuria.  Musculoskeletal: Negative for joint pain, myalgias and neck pain.  Skin: Negative for rash.  Neurological:  Negative for headaches.  Psychiatric/Behavioral: Negative for depression and substance abuse. The patient is not nervous/anxious.      Allergies  Allergen Reactions  . Sulfa Antibiotics Other (See Comments)    Burns the inside of my mouth; "blisters; leaves tongue and inside of mouth solid red"  . Tape Other (See Comments)    Blisters- USE PAPER TAPE ONLY  . Decongestant [Pseudoephedrine Hcl Er] Itching    ALL DECONGESTANTS  . Duloxetine Other (See Comments)    Felt "out of it"  . Fremanezumab-Vfrm Other (See Comments) and Nausea Only  . Tizanidine Other (See Comments)    headache  . Chlorpheniramine Itching  . Red Dye Other (See Comments)    CAUSES MIGRAINES  . Eggs Or Egg-Derived Products Nausea And Vomiting  . Latex Itching  . Percocet [Oxycodone-Acetaminophen] Other (See Comments)    PT reports having nightmares when taking Percocet  . Prednisone Other (See Comments)    "Gives me a bad attitude"  . Zoloft [Sertraline Hcl] Rash    OBJECTIVE: Vitals:   06/07/20 1505 06/07/20 2023 06/08/20 0600 06/08/20 0757  BP: 116/60 (!) 119/59 (!) 117/58 121/78  Pulse: 63 74 (!) 57 78  Resp: 17 15 16 20   Temp: 98.7 F (37.1 C) 98.4 F (36.9 C) 97.8 F (36.6 C) 98.4 F (36.9 C)  TempSrc: Oral Oral Oral Oral  SpO2: 95% 96%  100%  Weight:      Height:       Body mass index is 33.79 kg/m.  Physical Exam Nursing note reviewed.  Constitutional:      Appearance: She is well-developed. She is obese. She is not ill-appearing.  Cardiovascular:     Rate and Rhythm: Normal rate and regular rhythm.     Heart sounds: Normal heart sounds.  Abdominal:     General: Bowel sounds are normal.     Palpations: Abdomen is soft.     Tenderness: There is no abdominal tenderness.  Skin:    General: Skin is warm and dry.     Capillary Refill: Capillary refill takes less than 2 seconds.  Neurological:     Mental Status: She is alert and oriented to person, place, and time.    06/08/2020  wound appears clean. Periwound is without erythema or induration. Large volume subcutaneous fat present.    Lab Results Lab Results  Component Value Date   WBC 12.3 (H) 06/08/2020   HGB 11.8 (L) 06/08/2020   HCT 35.9 (L) 06/08/2020   MCV 94.0 06/08/2020   PLT 502 (H) 06/08/2020    Lab Results  Component Value Date   CREATININE 1.23 (H) 06/08/2020   BUN 11  06/08/2020   NA 136 06/08/2020   K 4.2 06/08/2020   CL 97 (L) 06/08/2020   CO2 28 06/08/2020    Lab Results  Component Value Date   ALT 10 06/07/2020   AST 14 (L) 06/07/2020   ALKPHOS 97 06/07/2020   BILITOT 0.5 06/07/2020     Microbiology:    Janene Madeira, MSN, NP-C [facility] [email or web address] Pager: [phone] Office: [phone] [place]: [phone]

## 2020-06-08 NOTE — Progress Notes (Addendum)
RN showed pt's daughter how to pack and change wound dressing at home. Daughter verbalized understanding and all questions answered.  1833: pt's wound has been repacked by this RN 5 times this shift.

## 2020-06-08 NOTE — TOC Initial Note (Addendum)
Transition of Care [facility]) - Initial/Assessment Note    Patient Details  Name: Kristie Tapia MRN: [id number] Date of Birth: 10-Sep-1970  [facility] of [facility] CM/SW Contact:    Erin Sons, LCSW Phone Number: 06/08/2020, 10:07 AM  Clinical Narrative:                 CSW responded to [facility] consult. Pt lives at home with her husband in [place]. Her daughter lives next door. Pt is requesting [facility]. Pt states she has an appointment with Jaci Standard w/ [facility] on Tuesday to check on her wound.    CSW called [facility]; they do not have RN staff for pt.   1155: [facility] cannot accept.   [facility] Cannot accept  Expected Discharge Plan: Home w Home Health Services Barriers to Discharge: Continued Medical Work up   Patient Goals and CMS Choice Patient states their goals for this hospitalization and ongoing recovery are:: Home with Home Health CMS Medicare.gov Compare Post Acute Care list provided to:: Patient Choice offered to / list presented to : Patient  Expected Discharge Plan and Services Expected Discharge Plan: Home w Home Health Services       Living arrangements for the past 2 months: Single Family Home                                      Prior Living Arrangements/Services Living arrangements for the past 2 months: Single Family Home Lives with:: Spouse          Need for Family Participation in Patient Care: Yes (Comment) Care giver support system in place?: Yes (comment)      Activities of Daily Living Home Assistive Devices/Equipment: None ADL Screening (condition at time of admission) Patient's cognitive ability adequate to safely complete daily activities?: Yes Is the patient deaf or have difficulty hearing?: No Does the patient have difficulty seeing, even when wearing glasses/contacts?: No Does the patient have difficulty concentrating, remembering, or making decisions?: No Patient able to express need for assistance with ADLs?: Yes Does  the patient have difficulty dressing or bathing?: Yes Independently performs ADLs?: Yes (appropriate for developmental age) Does the patient have difficulty walking or climbing stairs?: Yes Weakness of Legs: Both Weakness of Arms/Hands: None  Permission Sought/Granted                  Emotional Assessment Appearance:: Appears stated age Attitude/Demeanor/Rapport:  (Distracted by pain)   Orientation: : Oriented to Self,Oriented to Place,Oriented to  Time,Oriented to Situation Alcohol / Substance Use: Not Applicable Psych Involvement: No (comment)  Admission diagnosis:  Necrotizing fasciitis (HCC) [M72.6] Status post surgery [Z98.890] Necrotizing soft tissue infection [M79.89] Patient Active Problem List   Diagnosis Date Noted  . Status post surgery 05/23/2020  . Necrotizing soft tissue infection 05/23/2020  . Lower respiratory infection 07/06/2018  . Cough 07/06/2018  . Chronic musculoskeletal pain 07/06/2018  . CVA (cerebral vascular accident) (HCC) 12/27/2017  . Tobacco abuse 12/27/2017  . Obesity 12/27/2017  . Ischemic stroke (HCC) 12/27/2017  . Diabetic peripheral neuropathy (HCC) 12/27/2017  . Smoker unmotivated to quit 07/07/2017  . Chronic GERD 07/07/2017  . Chronic anxiety 07/07/2017  . Dyslipidemia 02/24/2017  . Diabetes (HCC) 02/24/2017  . Headache 02/24/2017   PCP:  System, Provider Not In Pharmacy:   [facility] [place] - [address] [place]  [address] Phone: [phone] Fax: [phone]  [facility] - [place], [place] - [address] [place] [address] Phone: [phone] Fax: [phone]     Social Determinants of Health (SDOH) Interventions    Readmission Risk Interventions No flowsheet data found.

## 2020-06-08 NOTE — Progress Notes (Signed)
Physical Therapy Wound Treatment Patient Details  Name: Kristie Tapia MRN: [id number] Date of Birth: 08-Dec-1970  Today's Date: 06/08/2020 Time: 1040-1130 Time Calculation (min): 50 min  Subjective  Subjective: Pleasant and agreeable to hydrotherapy Patient and Family Stated Goals: Avoid another surgery Date of Onset: 05/23/20 Prior Treatments: I&D in OR x 2  Pain Score: Premedicated and tolerated well. Wound Assessment  Wound / Incision (Open or Dehisced) 05/24/20 Incision - Dehisced Buttocks Right (Active)  Dressing Type ABD;Gauze (Comment);Mesh briefs 06/08/20 1328  Dressing Changed Changed 06/08/20 1328  Dressing Status Clean;Dry;Intact 06/08/20 1328  Dressing Change Frequency Daily 06/08/20 1328  Site / Wound Assessment Yellow;Pink;Brown 06/08/20 1328  % Wound base Red or Granulating 30% 06/08/20 1328  % Wound base Yellow/Fibrinous Exudate 65% 06/08/20 1328  % Wound base Black/Eschar 5% 06/08/20 1328  % Wound base Other/Granulation Tissue (Comment) 0% 06/08/20 1328  Peri-wound Assessment Erythema (blanchable) 06/08/20 1328  Wound Length (cm) 9.8 cm 06/06/20 1000  Wound Width (cm) 6 cm 06/06/20 1000  Wound Depth (cm) 10.1 cm 06/06/20 1000  Wound Volume (cm^3) 593.88 cm^3 06/06/20 1000  Wound Surface Area (cm^2) 58.8 cm^2 06/06/20 1000  Tunneling (cm) 5 at 12:00, 4.5v at 1:00-2:00 06/06/20 1000  Margins Unattached edges (unapproximated) 06/08/20 1328  Closure None 06/08/20 1328  Drainage Amount Minimal 06/08/20 1328  Drainage Description Purulent 06/08/20 1328  Non-staged Wound Description Full thickness 06/08/20 0945  Treatment Debridement (Selective);Hydrotherapy (Pulse lavage);Packing (Saline gauze) 06/08/20 1328   Hydrotherapy Pulsed lavage therapy - wound location: rt buttock Pulsed Lavage with Suction (psi): 12 psi Pulsed Lavage with Suction - Normal Saline Used: 1000 mL Pulsed Lavage Tip: Tip with splash shield Selective Debridement Selective Debridement -  Location: rt buttock Selective Debridement - Tools Used: Forceps;Scalpel Selective Debridement - Tissue Removed: yellow and brown unviable tissue   Wound Assessment and Plan  Wound Therapy - Assess/Plan/Recommendations Wound Therapy - Clinical Statement: Periwound area appears improved today with decreased erythema. Area "crusted" again with barrier film (skin prep) and anti-fungal powder. Debridement of the buttock wound is progressing with visible pink healthy tissue emerging through necrotic adipose. This patient will benefit from continued hydrotherapy for selective removal of unviable tissue, to decrease bioburden, and promote wound bed healing. Wound Therapy - Functional Problem List: Open, painful wound and inadvisable to sit for prolonged periods due to wound location Factors Delaying/Impairing Wound Healing: Infection - systemic/local;Diabetes Mellitus Hydrotherapy Plan: Debridement;Dressing change;Patient/family education;Pulsatile lavage with suction Wound Therapy - Frequency: 6X / week Wound Therapy - Follow Up Recommendations: Home health RN Wound Plan: see above  Wound Therapy Goals- Improve the function of patient's integumentary system by progressing the wound(s) through the phases of wound healing (inflammation - proliferation - remodeling) by: Decrease Necrotic Tissue to: <40% Decrease Necrotic Tissue - Progress: Progressing toward goal Increase Granulation Tissue to: >40% Increase Granulation Tissue - Progress: Progressing toward goal Decrease Length/Width/Depth by (cm): 0/0/1.0cm Decrease Length/Width/Depth - Progress: Progressing toward goal Improve Drainage Characteristics: Min;Serous Improve Drainage Characteristics - Progress: Progressing toward goal Patient/Family will be able to : verbalize wound dressing application to instruct future caregivers Patient/Family Instruction Goal - Progress: Progressing toward goal Goals/treatment plan/discharge plan were made with  and agreed upon by patient/family: Yes Time For Goal Achievement: 7 days Wound Therapy - Potential for Goals: Fair  Goals will be updated until maximal potential achieved or discharge criteria met.  Discharge criteria: when goals achieved, discharge from hospital, MD decision/surgical intervention, no progress towards goals, refusal/missing three consecutive treatments without notification or  medical reason.  GP     Thelma Comp 06/08/2020, 1:33 PM   Rolinda Roan, PT, DPT Acute Rehabilitation Services Pager: [phone] Office: [phone]

## 2020-06-08 NOTE — Progress Notes (Signed)
Progress Note: General Surgery Service   Chief Complaint/Subjective: Ambulated some, continued  Back pain  Objective: Vital signs in last 24 hours: Temp:  [97.8 F (36.6 C)-98.7 F (37.1 C)] 98.4 F (36.9 C) (01/05 0757) Pulse Rate:  [57-78] 78 (01/05 0757) Resp:  [15-20] 20 (01/05 0757) BP: (116-121)/(58-78) 121/78 (01/05 0757) SpO2:  [95 %-100 %] 100 % (01/05 0757) Last BM Date: 06/04/20  Intake/Output from previous day: No intake/output data recorded. Intake/Output this shift: No intake/output data recorded.  Gen: NAD  Resp: nonlabored  Card: RRR  Abd: soft, NT, ND  Back: perirectal wound coverd with bandage  Lab Results: CBC  Recent Labs    06/07/20 0412 06/08/20 0320  WBC 14.0* 12.3*  HGB 12.1 11.8*  HCT 36.7 35.9*  PLT 565* 502*   BMET Recent Labs    06/07/20 0412 06/08/20 0320  NA 133* 136  K 4.6 4.2  CL 98 97*  CO2 27 28  GLUCOSE 139* 156*  BUN 11 11  CREATININE 1.16* 1.23*  CALCIUM 8.2* 8.3*   PT/INR No results for input(s): LABPROT, INR in the last 72 hours. ABG No results for input(s): PHART, HCO3 in the last 72 hours.  Invalid input(s): PCO2, PO2  Anti-infectives: Anti-infectives (From admission, onward)   Start     Dose/Rate Route Frequency Ordered Stop   06/07/20 2200  linezolid (ZYVOX) tablet 600 mg        600 mg Oral Every 12 hours 06/07/20 1613 06/08/20 2159   06/02/20 1545  linezolid (ZYVOX) tablet 600 mg        600 mg Oral Every 12 hours 06/02/20 1452 06/05/20 2133   06/01/20 1400  piperacillin-tazobactam (ZOSYN) IVPB 3.375 g        3.375 g 12.5 mL/hr over 240 Minutes Intravenous Every 8 hours 06/01/20 0945     05/26/20 1400  metroNIDAZOLE (FLAGYL) tablet 500 mg  Status:  Discontinued        500 mg Oral Every 8 hours 05/26/20 0853 06/01/20 0939   05/25/20 2200  vancomycin (VANCOCIN) IVPB 1000 mg/200 mL premix  Status:  Discontinued        1,000 mg 200 mL/hr over 60 Minutes Intravenous Every 12 hours 05/25/20 0918  05/30/20 1129   05/24/20 1800  vancomycin (VANCOREADY) IVPB 750 mg/150 mL  Status:  Discontinued        750 mg 150 mL/hr over 60 Minutes Intravenous Every 24 hours 05/23/20 1914 05/24/20 0811   05/24/20 1400  ceFEPIme (MAXIPIME) 2 g in sodium chloride 0.9 % 100 mL IVPB  Status:  Discontinued        2 g 200 mL/hr over 30 Minutes Intravenous Every 8 hours 05/24/20 0811 06/01/20 0939   05/24/20 0900  vancomycin (VANCOCIN) IVPB 1000 mg/200 mL premix  Status:  Discontinued        1,000 mg 200 mL/hr over 60 Minutes Intravenous Every 12 hours 05/24/20 0811 05/25/20 0918   05/24/20 0800  ceFEPIme (MAXIPIME) 2 g in sodium chloride 0.9 % 100 mL IVPB  Status:  Discontinued        2 g 200 mL/hr over 30 Minutes Intravenous Every 12 hours 05/23/20 1914 05/24/20 0811   05/23/20 2200  clindamycin (CLEOCIN) IVPB 600 mg  Status:  Discontinued        600 mg 100 mL/hr over 30 Minutes Intravenous Every 8 hours 05/23/20 2129 05/26/20 0853   05/23/20 1830  vancomycin (VANCOREADY) IVPB 1500 mg/300 mL  1,500 mg 150 mL/hr over 120 Minutes Intravenous  Once 05/23/20 1813 05/23/20 2229   05/23/20 1800  ceFEPIme (MAXIPIME) 2 g in sodium chloride 0.9 % 100 mL IVPB        2 g 200 mL/hr over 30 Minutes Intravenous  Once 05/23/20 1750 05/23/20 1934   05/23/20 1800  metroNIDAZOLE (FLAGYL) IVPB 500 mg        500 mg 100 mL/hr over 60 Minutes Intravenous  Once 05/23/20 1750 05/23/20 2027   05/23/20 1800  vancomycin (VANCOCIN) IVPB 1000 mg/200 mL premix  Status:  Discontinued        1,000 mg 200 mL/hr over 60 Minutes Intravenous  Once 05/23/20 1750 05/23/20 1813      Medications: Scheduled Meds: . Chlorhexidine Gluconate Cloth  6 each Topical Daily  . clopidogrel  75 mg Oral Daily  . donepezil  10 mg Oral Daily  . enoxaparin (LOVENOX) injection  40 mg Subcutaneous Q24H  . escitalopram  20 mg Oral QHS  . famotidine  40 mg Oral QHS  . feeding supplement (GLUCERNA SHAKE)  237 mL Oral BID BM  . furosemide  20 mg  Oral Daily  . insulin aspart  0-15 Units Subcutaneous TID WC & HS  . insulin aspart  7 Units Subcutaneous TID WC  . insulin glargine  22 Units Subcutaneous Daily  . linezolid  600 mg Oral Q12H  . multivitamin with minerals  1 tablet Oral Daily  . pantoprazole  40 mg Oral BID  . polycarbophil  625 mg Oral Daily  . pregabalin  100 mg Oral q AM  . pregabalin  200 mg Oral QHS  . Ensure Max Protein  11 oz Oral QHS  . rosuvastatin  20 mg Oral QHS  . saccharomyces boulardii  250 mg Oral BID  . sodium chloride flush  10-40 mL Intracatheter Q12H  . traZODone  150 mg Oral QHS   Continuous Infusions: . piperacillin-tazobactam (ZOSYN)  IV 3.375 g (06/08/20 1884)   PRN Meds:.acetaminophen, albuterol, alum & mag hydroxide-simeth, diphenhydrAMINE, fluticasone, HYDROcodone-acetaminophen, HYDROmorphone (DILAUDID) injection, LORazepam, ondansetron (ZOFRAN) IV, promethazine **OR** promethazine **OR** promethazine, sodium chloride flush, SUMAtriptan  Assessment/Plan: s/p Procedure(s): INCISION AND DRAINAGE BUTTOCK ABSCESS 05/25/2020 -WBC improving -discharge planning for home health and wound clinic -change Abx to augmentin at time of discharge    LOS: 16 days   Rodman Pickle, MD [phone] [facility]

## 2020-06-08 NOTE — Progress Notes (Signed)
Physical Therapy Treatment Patient Details Name: Kristie Tapia MRN: [id number] DOB: [birth year] Today's Date: 06/08/2020    History of Present Illness Patient is a 50 year old female, with a history of CVA, diabetes, fibromyalgia, who comes in secondary to right buttock pain and abdominal pain.   Pt s/p I and D x2 of non viable tissues and drainage of abscess.    PT Comments    Pt up ad lib in hallway on staff arrival, agreeable to therapy session and with good participation and tolerance for mobility. Pt noted to be not wearing socks and gown not covering backside fully, pt assisted back into room to clean feet and don socks and cover pt up properly. Pt able to progress gait distance to ~369ft with no AD and min guard/minA and at times IV pole (Supervision when using IV pole). Pt performed 3 steps with Supervision and railing, needing cues for safety. Pt easily distracted and at times with decreased safety during ambulation tasks in room/hallway which increases her risk of falls, pt would benefit from staff assist for hallway ambulation, RN notified. Pt reports 7/10 modified RPE (fatigue) at end of session. Pt continues to benefit from PT services to progress toward functional mobility goals. D/C recs below, pending progress.  Follow Up Recommendations  No PT follow up     Equipment Recommendations  Rolling walker with 5" wheels (pt may be more amenable to cane if refuses RW?)    Recommendations for Other Services       Precautions / Restrictions Precautions Precautions: Fall Restrictions Weight Bearing Restrictions: No    Mobility  Bed Mobility Overal bed mobility: Modified Independent                Transfers Overall transfer level: Modified independent Equipment used: None Transfers: Sit to/from Stand Sit to Stand: Modified independent (Device/Increase time)         General transfer comment: cues for safety and awareness of IV  line/pole  Ambulation/Gait Ambulation/Gait assistance: Min assist;Min guard Gait Distance (Feet): 300 Feet (including 1 seated break after 161ft) Assistive device: 1 person hand held assist;IV Pole Gait Pattern/deviations: Step-to pattern     General Gait Details: pt Supervision using IV pole but with no AD needs minA to min guard/HHA at times due to inconsistent foot placement and at times drifting L/R; offered to obtain cane for pt to trial but pt defers   Stairs Stairs: Yes Stairs assistance: Supervision Stair Management: One rail Right Number of Stairs: 3 General stair comments: Cues for sequencing and safety and awareness of IV line   Wheelchair Mobility    Modified Rankin (Stroke Patients Only)       Balance Overall balance assessment: Needs assistance Sitting-balance support: No upper extremity supported;Feet supported Sitting balance-Leahy Scale: Good     Standing balance support: During functional activity;No upper extremity supported;Single extremity supported Standing balance-Leahy Scale: Fair Standing balance comment: pt with some lateral LOB requiring min guard to minA to correct during dynamic standing tasks, but when holding IV pole no LOB                            Cognition Arousal/Alertness: Awake/alert Behavior During Therapy: WFL for tasks assessed/performed Overall Cognitive Status: Within Functional Limits for tasks assessed Area of Impairment: Attention;Safety/judgement;Awareness;Problem solving                         Safety/Judgement: Decreased awareness of safety  Problem Solving: Slow processing General Comments: impulsive at times      Exercises      General Comments General comments (skin integrity, edema, etc.): mesh underpants and dressing covering buttocks wound; pt denies dizziness/SOB during mobility, VSS per chart and not otherwise assessed      Pertinent Vitals/Pain Pain Assessment: 0-10 Pain Score:  7  Pain Location: buttocks, mid-back Pain Descriptors / Indicators: Grimacing;Discomfort Pain Intervention(s): Monitored during session;Repositioned    Home Living                      Prior Function            PT Goals (current goals can now be found in the care plan section) Acute Rehab PT Goals Patient Stated Goal: get this pain and swelling down PT Goal Formulation: With patient Time For Goal Achievement: 06/16/20 Potential to Achieve Goals: Good Progress towards PT goals: Progressing toward goals    Frequency    Min 3X/week      PT Plan Current plan remains appropriate    Co-evaluation              AM-PAC PT "6 Clicks" Mobility   Outcome Measure  Help needed turning from your back to your side while in a flat bed without using bedrails?: None Help needed moving from lying on your back to sitting on the side of a flat bed without using bedrails?: None Help needed moving to and from a bed to a chair (including a wheelchair)?: A Little Help needed standing up from a chair using your arms (e.g., wheelchair or bedside chair)?: None Help needed to walk in hospital room?: A Little Help needed climbing 3-5 steps with a railing? : A Little 6 Click Score: 21    End of Session Equipment Utilized During Treatment: Gait belt Activity Tolerance: Patient tolerated treatment well Patient left: in bed;with call bell/phone within reach (seated EOB to eat with tray table in front) Nurse Communication: Mobility status PT Visit Diagnosis: Other abnormalities of gait and mobility (R26.89);Pain;Difficulty in walking, not elsewhere classified (R26.2) Pain - Right/Left: Right Pain - part of body:  (buttock and mid-back)     Time: 3559-7416 PT Time Calculation (min) (ACUTE ONLY): 17 min  Charges:  $Gait Training: 8-22 mins                     Chipper Koudelka P., PTA Acute Rehabilitation Services Pager: [phone] Office: [phone]   [place] 06/08/2020, 2:28  PM

## 2020-06-08 NOTE — Progress Notes (Signed)
Consult NOTE    Kristie Tapia  NLZ:[id number] DOB: [birth year] DOA: 05/23/2020 PCP: System, Provider Not In    Brief Narrative:  Kristie Tapia an 50 y.o.femalewith pmh uncontrolled diabetes mellitus type 2, CVA, OSA, and chronic pain who was admitted on 12/20 for necrotizing fasciitis of the R buttock.She is awake and alert. She reported that she started with a boil on her buttock and tried to pop it. It spread and she tried to lance it without success. She as having abdominal pain as well and went to urgent care and eventually had a CT showing severe infection and so came in and had debridement.   Hospitalist were consulted for assistance with medical management   Today, Patient continues to complain of back pain, denies any other new complaints.   Assessment & Plan:   Principal Problem:   Necrotizing soft tissue infection Active Problems:   Dyslipidemia   Diabetes (HCC)   Smoker unmotivated to quit   CVA (cerebral vascular accident) (HCC)   Obesity   Chronic musculoskeletal pain   Necrotizing soft tissue infection of buttock  Currently afebrile, with downtrending leukocytosis Status post I&D on 12/20 and 12/22  Wound care per surgery, hydrotherapy Cultures positive for Prevotella (+beta lactamase) and gardnerella vaginalis ID on board, recommend transition to p.o. cefdinir, Flagyl to complete 4-week course, patient declined Augmentin due to prior vaginitis. Recommend Diflucan at discharge  Diabetes mellitus type 2 (uncontrolled) Hemoglobin A1c 8.9 Continue Lantus, aspart TID with meals, SSI, Accu-Cheks, hypoglycemic protocol  Vanc Resistant Enterococcus UTI Completed linezolid x 5 days per ID  Hypoalbuminemia/moderate protein calorie malnutrition Prealbumin 10.3 Glucerna supplementation in between meals  Chronic diastolic dysfunction Last EF noted to be 55 to 60% with grade 1 diastolic dysfunction in 2019 BNP 329 Continue home Lasix dose Strict  I's and O's, daily weight  GERD Continue PPI twice daily  Superficial Venous Thrombosis Bilateral upper extremity cephalic vein superficial vein thrombosis   History of CVA Continue plavix  Depression and anxiety Continue current regimen  Early onset dementia Continue Aricept   DVT prophylaxis: lovenox Code Status: full  Family Communication: None at bedside Disposition:   Status is: Inpatient  Remains inpatient appropriate because:Inpatient level of care appropriate due to severity of illness   Dispo: The patient is from: Home              Anticipated d/c is to: Home              Anticipated d/c date is: TBD by primary attending              Patient currently is not medically stable to d/c.   Consultants:   [facility] consulting  ID  General surgery is primary  Procedures:  I&D x 2  Antimicrobials:  Anti-infectives (From admission, onward)   Start     Dose/Rate Route Frequency Ordered Stop   06/09/20 0800  amoxicillin-clavulanate (AUGMENTIN) 875-125 MG per tablet 1 tablet  Status:  Discontinued        1 tablet Oral Every 12 hours 06/08/20 0934 06/08/20 1002   06/08/20 1400  metroNIDAZOLE (FLAGYL) tablet 500 mg        500 mg Oral Every 12 hours 06/08/20 1012     06/08/20 1400  cefdinir (OMNICEF) capsule 300 mg        300 mg Oral Every 12 hours 06/08/20 1016     06/07/20 2200  linezolid (ZYVOX) tablet 600 mg  Status:  Discontinued  600 mg Oral Every 12 hours 06/07/20 1613 06/08/20 0932   06/02/20 1545  linezolid (ZYVOX) tablet 600 mg        600 mg Oral Every 12 hours 06/02/20 1452 06/05/20 2133   06/01/20 1400  piperacillin-tazobactam (ZOSYN) IVPB 3.375 g  Status:  Discontinued        3.375 g 12.5 mL/hr over 240 Minutes Intravenous Every 8 hours 06/01/20 0945 06/08/20 1005   05/26/20 1400  metroNIDAZOLE (FLAGYL) tablet 500 mg  Status:  Discontinued        500 mg Oral Every 8 hours 05/26/20 0853 06/01/20 0939   05/25/20 2200  vancomycin (VANCOCIN) IVPB  1000 mg/200 mL premix  Status:  Discontinued        1,000 mg 200 mL/hr over 60 Minutes Intravenous Every 12 hours 05/25/20 0918 05/30/20 1129   05/24/20 1800  vancomycin (VANCOREADY) IVPB 750 mg/150 mL  Status:  Discontinued        750 mg 150 mL/hr over 60 Minutes Intravenous Every 24 hours 05/23/20 1914 05/24/20 0811   05/24/20 1400  ceFEPIme (MAXIPIME) 2 g in sodium chloride 0.9 % 100 mL IVPB  Status:  Discontinued        2 g 200 mL/hr over 30 Minutes Intravenous Every 8 hours 05/24/20 0811 06/01/20 0939   05/24/20 0900  vancomycin (VANCOCIN) IVPB 1000 mg/200 mL premix  Status:  Discontinued        1,000 mg 200 mL/hr over 60 Minutes Intravenous Every 12 hours 05/24/20 0811 05/25/20 0918   05/24/20 0800  ceFEPIme (MAXIPIME) 2 g in sodium chloride 0.9 % 100 mL IVPB  Status:  Discontinued        2 g 200 mL/hr over 30 Minutes Intravenous Every 12 hours 05/23/20 1914 05/24/20 0811   05/23/20 2200  clindamycin (CLEOCIN) IVPB 600 mg  Status:  Discontinued        600 mg 100 mL/hr over 30 Minutes Intravenous Every 8 hours 05/23/20 2129 05/26/20 0853   05/23/20 1830  vancomycin (VANCOREADY) IVPB 1500 mg/300 mL        1,500 mg 150 mL/hr over 120 Minutes Intravenous  Once 05/23/20 1813 05/23/20 2229   05/23/20 1800  ceFEPIme (MAXIPIME) 2 g in sodium chloride 0.9 % 100 mL IVPB        2 g 200 mL/hr over 30 Minutes Intravenous  Once 05/23/20 1750 05/23/20 1934   05/23/20 1800  metroNIDAZOLE (FLAGYL) IVPB 500 mg        500 mg 100 mL/hr over 60 Minutes Intravenous  Once 05/23/20 1750 05/23/20 2027   05/23/20 1800  vancomycin (VANCOCIN) IVPB 1000 mg/200 mL premix  Status:  Discontinued        1,000 mg 200 mL/hr over 60 Minutes Intravenous  Once 05/23/20 1750 05/23/20 1813      Objective: Vitals:   06/07/20 1505 06/07/20 2023 06/08/20 0600 06/08/20 0757  BP: 116/60 (!) 119/59 (!) 117/58 121/78  Pulse: 63 74 (!) 57 78  Resp: 17 15 16 20   Temp: 98.7 F (37.1 C) 98.4 F (36.9 C) 97.8 F (36.6  C) 98.4 F (36.9 C)  TempSrc: Oral Oral Oral Oral  SpO2: 95% 96%  100%  Weight:      Height:        Intake/Output Summary (Last 24 hours) at 06/08/2020 1336 Last data filed at 06/08/2020 0900 Gross per 24 hour  Intake 240 ml  Output -  Net 240 ml   Filed Weights   05/23/20 1542 05/24/20  0200  Weight: 87.1 kg 92.1 kg    Examination:  General: NAD   Cardiovascular: S1, S2 present  Respiratory: CTAB  Abdomen: Soft, nontender, nondistended, bowel sounds present  Musculoskeletal: Trace bilateral pedal edema noted  Skin:  Gluteal wound dressing C/D/I  Psychiatry: Normal mood    Data Reviewed: I have personally reviewed following labs and imaging studies  CBC: Recent Labs  Lab 06/04/20 0345 06/05/20 0418 06/06/20 0425 06/07/20 0412 06/08/20 0320  WBC 17.8* 17.7* 16.1* 14.0* 12.3*  NEUTROABS 13.6* 13.6* 12.3* 10.6*  --   HGB 12.1 11.9* 12.4 12.1 11.8*  HCT 37.2 37.0 38.5 36.7 35.9*  MCV 92.5 93.9 93.2 93.4 94.0  PLT 510* 537* 562* 565* 502*    Basic Metabolic Panel: Recent Labs  Lab 06/04/20 0345 06/05/20 0418 06/06/20 0425 06/07/20 0412 06/08/20 0320  NA 138 135 136 133* 136  K 4.0 4.4 4.4 4.6 4.2  CL 97* 97* 96* 98 97*  CO2 32 30 28 27 28   GLUCOSE 143* 136* 152* 139* 156*  BUN 11 15 12 11 11   CREATININE 1.13* 1.14* 1.28* 1.16* 1.23*  CALCIUM 8.2* 8.0* 8.3* 8.2* 8.3*  MG 2.1 2.0 2.1 2.1  --   PHOS 4.4 4.5 5.1* 4.2  --     GFR: Estimated Creatinine Clearance: 62 mL/min (A) (by C-G formula based on SCr of 1.23 mg/dL (H)).  Liver Function Tests: Recent Labs  Lab 06/04/20 0345 06/05/20 0418 06/06/20 0425 06/07/20 0412  AST 17 15 17  14*  ALT 10 10 11 10   ALKPHOS 104 90 106 97  BILITOT 0.6 0.6 0.8 0.5  PROT 6.0* 6.3* 6.8 6.7  ALBUMIN 2.2* 2.3* 2.5* 2.5*    CBG: Recent Labs  Lab 06/07/20 0656 06/07/20 0804 06/07/20 1712 06/08/20 0754 06/08/20 1141  GLUCAP 154* 141* 129* 220* 153*     Recent Results (from the past 240 hour(s))   Culture, Urine     Status: Abnormal   Collection Time: 06/01/20  5:06 PM   Specimen: Urine, Random  Result Value Ref Range Status   Specimen Description URINE, RANDOM  Final   Special Requests   Final    NONE Performed at [facility], [address], [place], 06/03/20 [facility]    Culture (A)  Final    >=100,000 COLONIES/mL VANCOMYCIN RESISTANT ENTEROCOCCUS   Report Status 06/03/2020 FINAL  Final   Organism ID, Bacteria VANCOMYCIN RESISTANT ENTEROCOCCUS (A)  Final      Susceptibility   Vancomycin resistant enterococcus - MIC*    AMPICILLIN >=32 RESISTANT Resistant     NITROFURANTOIN 32 SENSITIVE Sensitive     VANCOMYCIN >=32 RESISTANT Resistant     LINEZOLID 2 SENSITIVE Sensitive     * >=100,000 COLONIES/mL VANCOMYCIN RESISTANT ENTEROCOCCUS         Radiology Studies: No results found.      Scheduled Meds: . cefdinir  300 mg Oral Q12H  . Chlorhexidine Gluconate Cloth  6 each Topical Daily  . clopidogrel  75 mg Oral Daily  . donepezil  10 mg Oral Daily  . enoxaparin (LOVENOX) injection  40 mg Subcutaneous Q24H  . escitalopram  20 mg Oral QHS  . famotidine  40 mg Oral QHS  . feeding supplement (GLUCERNA SHAKE)  237 mL Oral BID BM  . furosemide  20 mg Oral Daily  . insulin aspart  0-15 Units Subcutaneous TID WC & HS  . insulin aspart  7 Units Subcutaneous TID WC  . insulin glargine  22 Units Subcutaneous Daily  . metroNIDAZOLE  500 mg Oral Q12H  . multivitamin with minerals  1 tablet Oral Daily  . pantoprazole  40 mg Oral BID  . polycarbophil  625 mg Oral Daily  . pregabalin  100 mg Oral q AM  . pregabalin  200 mg Oral QHS  . Ensure Max Protein  11 oz Oral QHS  . rosuvastatin  20 mg Oral QHS  . saccharomyces boulardii  250 mg Oral BID  . sodium chloride flush  10-40 mL Intracatheter Q12H  . traZODone  150 mg Oral QHS   Continuous Infusions:    LOS: 16 days       Alma Friendly, MD [facility]  06/08/2020, 1:36 PM

## 2020-06-08 NOTE — Discharge Instructions (Signed)
Wet to Dry WOUND CARE: - Change dressing twice daily - Supplies: sterile saline, kerlex, scissors, ABD pads, tape  1. Remove dressing and all packing carefully, moistening with sterile saline as needed to avoid packing/internal dressing sticking to the wound. 2.   Clean edges of skin around the wound with water/gauze, making sure there is no tape debris or leakage left on skin that could cause skin irritation or breakdown. 3.   Dampen and clean kerlex with sterile saline and pack wound from wound base to skin level, making sure to take note of any possible areas of wound tracking, tunneling and packing appropriately. Wound can be packed loosely. Trim kerlex to size if a whole kerlex is not required. 4.   Cover wound with a dry ABD pad and secure with tape or mesh underwear.  5.   Write the date/time on the dry dressing/tape to better track when the last dressing change occurred. - apply any skin protectant/powder if recommended by clinician to protect skin/skin folds. - change dressing as needed if leakage occurs, wound gets contaminated, or patient requests to shower. - You may shower daily with wound open and following the shower the wound should be dried and a clean dressing placed.  - Medical grade tape as well as packing supplies can be found at [employer] on [address] or [employer] on [address]. The remaining supplies can be found at your local drug store, [employer] etc.

## 2020-06-09 LAB — GLUCOSE, CAPILLARY
Glucose-Capillary: 174 mg/dL — ABNORMAL HIGH (ref 70–99)
Glucose-Capillary: 254 mg/dL — ABNORMAL HIGH (ref 70–99)
Glucose-Capillary: 115 mg/dL — ABNORMAL HIGH (ref 70–99)
Glucose-Capillary: 123 mg/dL — ABNORMAL HIGH (ref 70–99)

## 2020-06-09 MED ORDER — FLUCONAZOLE 150 MG PO TABS: 150.0000 mg | ORAL_TABLET | ORAL | 0 refills | Status: AC

## 2020-06-09 NOTE — Progress Notes (Signed)
Called and reported to security.

## 2020-06-09 NOTE — Progress Notes (Signed)
Consult NOTE    Kristie Tapia  ZOX:[id number] DOB: 1970/11/04 DOA: 05/23/2020 PCP: System, Provider Not In    Brief Narrative:  Kristie Tapia an 50 y.o.femalewith pmh uncontrolled diabetes mellitus type 2, CVA, OSA, and chronic pain who was admitted on 12/20 for necrotizing fasciitis of the R buttock.She is awake and alert. She reported that she started with a boil on her buttock and tried to pop it. It spread and she tried to lance it without success. She as having abdominal pain as well and went to urgent care and eventually had a CT showing severe infection and so came in and had debridement.   Hospitalist were consulted for assistance with medical management   Today, patient denies any new complaints.   Assessment & Plan:   Principal Problem:   Necrotizing soft tissue infection Active Problems:   Dyslipidemia   Diabetes ([place])   Smoker unmotivated to quit   CVA (cerebral vascular accident) ([place])   Obesity   Chronic musculoskeletal pain   Necrotizing soft tissue infection of buttock  Currently afebrile, with downtrending leukocytosis Status post I&D on 12/20 and 12/22  Wound care per surgery, hydrotherapy Cultures positive for Prevotella (+beta lactamase) and gardnerella vaginalis ID on board, recommend transition to p.o. cefdinir, Flagyl to complete 4-week course, patient declined Augmentin due to prior vaginitis. Recommend Diflucan at discharge (order placed)  Diabetes mellitus type 2 (uncontrolled) Hemoglobin A1c 8.9 Continue Lantus, aspart TID with meals, SSI, Accu-Cheks, hypoglycemic protocol  Vanc Resistant Enterococcus UTI Completed linezolid x 5 days per ID  Hypoalbuminemia/moderate protein calorie malnutrition Prealbumin 10.3 Glucerna supplementation in between meals  Chronic diastolic dysfunction Last EF noted to be 55 to 60% with grade 1 diastolic dysfunction in 8119 BNP 329 Continue home Lasix dose Strict I's and O's, daily  weight  GERD Continue PPI twice daily  Superficial Venous Thrombosis Bilateral upper extremity cephalic vein superficial vein thrombosis   History of CVA Continue plavix  Depression and anxiety Continue current regimen  Early onset dementia Continue Aricept   DVT prophylaxis: lovenox Code Status: full  Family Communication: None at bedside Disposition:   Status is: Inpatient  Remains inpatient appropriate because:Inpatient level of care appropriate due to severity of illness   Dispo: The patient is from: Home              Anticipated d/c is to: Home              Anticipated d/c date is: TBD by primary attending              Patient currently is not medically stable to d/c.   Consultants:   [place] consulting  ID  General surgery is primary  Procedures:  I&D x 2  Antimicrobials:  Anti-infectives (From admission, onward)   Start     Dose/Rate Route Frequency Ordered Stop   06/09/20 0800  amoxicillin-clavulanate (AUGMENTIN) 875-125 MG per tablet 1 tablet  Status:  Discontinued        1 tablet Oral Every 12 hours 06/08/20 0934 06/08/20 1002   06/08/20 1400  metroNIDAZOLE (FLAGYL) tablet 500 mg        500 mg Oral Every 12 hours 06/08/20 1012     06/08/20 1400  cefdinir (OMNICEF) capsule 300 mg        300 mg Oral Every 12 hours 06/08/20 1016     06/07/20 2200  linezolid (ZYVOX) tablet 600 mg  Status:  Discontinued        600 mg Oral  Every 12 hours 06/07/20 1613 06/08/20 0932   06/02/20 1545  linezolid (ZYVOX) tablet 600 mg        600 mg Oral Every 12 hours 06/02/20 1452 06/05/20 2133   06/01/20 1400  piperacillin-tazobactam (ZOSYN) IVPB 3.375 g  Status:  Discontinued        3.375 g 12.5 mL/hr over 240 Minutes Intravenous Every 8 hours 06/01/20 0945 06/08/20 1005   05/26/20 1400  metroNIDAZOLE (FLAGYL) tablet 500 mg  Status:  Discontinued        500 mg Oral Every 8 hours 05/26/20 0853 06/01/20 0939   05/25/20 2200  vancomycin (VANCOCIN) IVPB 1000 mg/200 mL premix   Status:  Discontinued        1,000 mg 200 mL/hr over 60 Minutes Intravenous Every 12 hours 05/25/20 0918 05/30/20 1129   05/24/20 1800  vancomycin (VANCOREADY) IVPB 750 mg/150 mL  Status:  Discontinued        750 mg 150 mL/hr over 60 Minutes Intravenous Every 24 hours 05/23/20 1914 05/24/20 0811   05/24/20 1400  ceFEPIme (MAXIPIME) 2 g in sodium chloride 0.9 % 100 mL IVPB  Status:  Discontinued        2 g 200 mL/hr over 30 Minutes Intravenous Every 8 hours 05/24/20 0811 06/01/20 0939   05/24/20 0900  vancomycin (VANCOCIN) IVPB 1000 mg/200 mL premix  Status:  Discontinued        1,000 mg 200 mL/hr over 60 Minutes Intravenous Every 12 hours 05/24/20 0811 05/25/20 0918   05/24/20 0800  ceFEPIme (MAXIPIME) 2 g in sodium chloride 0.9 % 100 mL IVPB  Status:  Discontinued        2 g 200 mL/hr over 30 Minutes Intravenous Every 12 hours 05/23/20 1914 05/24/20 0811   05/23/20 2200  clindamycin (CLEOCIN) IVPB 600 mg  Status:  Discontinued        600 mg 100 mL/hr over 30 Minutes Intravenous Every 8 hours 05/23/20 2129 05/26/20 0853   05/23/20 1830  vancomycin (VANCOREADY) IVPB 1500 mg/300 mL        1,500 mg 150 mL/hr over 120 Minutes Intravenous  Once 05/23/20 1813 05/23/20 2229   05/23/20 1800  ceFEPIme (MAXIPIME) 2 g in sodium chloride 0.9 % 100 mL IVPB        2 g 200 mL/hr over 30 Minutes Intravenous  Once 05/23/20 1750 05/23/20 1934   05/23/20 1800  metroNIDAZOLE (FLAGYL) IVPB 500 mg        500 mg 100 mL/hr over 60 Minutes Intravenous  Once 05/23/20 1750 05/23/20 2027   05/23/20 1800  vancomycin (VANCOCIN) IVPB 1000 mg/200 mL premix  Status:  Discontinued        1,000 mg 200 mL/hr over 60 Minutes Intravenous  Once 05/23/20 1750 05/23/20 1813      Objective: Vitals:   06/08/20 0757 06/08/20 1458 06/08/20 2137 06/09/20 0758  BP: 121/78 (!) 104/56 (!) 112/53 108/65  Pulse: 78 63 63 65  Resp: 20 18 16 18   Temp: 98.4 F (36.9 C) 98.6 F (37 C) 98.3 F (36.8 C) (!) 97.5 F (36.4 C)   TempSrc: Oral Oral Oral Oral  SpO2: 100% 96% 96% 95%  Weight:      Height:        Intake/Output Summary (Last 24 hours) at 06/09/2020 1127 Last data filed at 06/08/2020 1700 Gross per 24 hour  Intake 480 ml  Output -  Net 480 ml   Filed Weights   05/23/20 1542 05/24/20 0200  Weight:  87.1 kg 92.1 kg    Examination:  General: NAD   Cardiovascular: S1, S2 present  Respiratory: CTAB  Abdomen: Soft, nontender, nondistended, bowel sounds present  Musculoskeletal: Trace bilateral pedal edema noted  Skin:  Gluteal wound dressing C/D/I  Psychiatry: Normal mood    Data Reviewed: I have personally reviewed following labs and imaging studies  CBC: Recent Labs  Lab 06/04/20 0345 06/05/20 0418 06/06/20 0425 06/07/20 0412 06/08/20 0320  WBC 17.8* 17.7* 16.1* 14.0* 12.3*  NEUTROABS 13.6* 13.6* 12.3* 10.6*  --   HGB 12.1 11.9* 12.4 12.1 11.8*  HCT 37.2 37.0 38.5 36.7 35.9*  MCV 92.5 93.9 93.2 93.4 94.0  PLT 510* 537* 562* 565* 502*    Basic Metabolic Panel: Recent Labs  Lab 06/04/20 0345 06/05/20 0418 06/06/20 0425 06/07/20 0412 06/08/20 0320  NA 138 135 136 133* 136  K 4.0 4.4 4.4 4.6 4.2  CL 97* 97* 96* 98 97*  CO2 32 30 28 27 28   GLUCOSE 143* 136* 152* 139* 156*  BUN 11 15 12 11 11   CREATININE 1.13* 1.14* 1.28* 1.16* 1.23*  CALCIUM 8.2* 8.0* 8.3* 8.2* 8.3*  MG 2.1 2.0 2.1 2.1  --   PHOS 4.4 4.5 5.1* 4.2  --     GFR: Estimated Creatinine Clearance: 62 mL/min (A) (by C-G formula based on SCr of 1.23 mg/dL (H)).  Liver Function Tests: Recent Labs  Lab 06/04/20 0345 06/05/20 0418 06/06/20 0425 06/07/20 0412  AST 17 15 17  14*  ALT 10 10 11 10   ALKPHOS 104 90 106 97  BILITOT 0.6 0.6 0.8 0.5  PROT 6.0* 6.3* 6.8 6.7  ALBUMIN 2.2* 2.3* 2.5* 2.5*    CBG: Recent Labs  Lab 06/08/20 0754 06/08/20 1141 06/08/20 1609 06/08/20 2151 06/09/20 0754  GLUCAP 220* 153* 197* 188* 174*     Recent Results (from the past 240 hour(s))  Culture, Urine      Status: Abnormal   Collection Time: 06/01/20  5:06 PM   Specimen: Urine, Random  Result Value Ref Range Status   Specimen Description URINE, RANDOM  Final   Special Requests   Final    NONE Performed at [facility], [address] [place], 06/03/20 [facility]    Culture (A)  Final    >=100,000 COLONIES/mL VANCOMYCIN RESISTANT ENTEROCOCCUS   Report Status 06/03/2020 FINAL  Final   Organism ID, Bacteria VANCOMYCIN RESISTANT ENTEROCOCCUS (A)  Final      Susceptibility   Vancomycin resistant enterococcus - MIC*    AMPICILLIN >=32 RESISTANT Resistant     NITROFURANTOIN 32 SENSITIVE Sensitive     VANCOMYCIN >=32 RESISTANT Resistant     LINEZOLID 2 SENSITIVE Sensitive     * >=100,000 COLONIES/mL VANCOMYCIN RESISTANT ENTEROCOCCUS         Radiology Studies: No results found.      Scheduled Meds: . cefdinir  300 mg Oral Q12H  . Chlorhexidine Gluconate Cloth  6 each Topical Daily  . clopidogrel  75 mg Oral Daily  . donepezil  10 mg Oral Daily  . enoxaparin (LOVENOX) injection  40 mg Subcutaneous Q24H  . escitalopram  20 mg Oral QHS  . famotidine  40 mg Oral QHS  . feeding supplement (GLUCERNA SHAKE)  237 mL Oral BID BM  . furosemide  20 mg Oral Daily  . insulin aspart  0-15 Units Subcutaneous TID WC & HS  . insulin aspart  7 Units Subcutaneous TID WC  . insulin glargine  22 Units Subcutaneous  Daily  . metroNIDAZOLE  500 mg Oral Q12H  . multivitamin with minerals  1 tablet Oral Daily  . pantoprazole  40 mg Oral BID  . polycarbophil  625 mg Oral Daily  . pregabalin  100 mg Oral q AM  . pregabalin  200 mg Oral QHS  . Ensure Max Protein  11 oz Oral QHS  . rosuvastatin  20 mg Oral QHS  . saccharomyces boulardii  250 mg Oral BID  . sodium chloride flush  10-40 mL Intracatheter Q12H  . traZODone  150 mg Oral QHS   Continuous Infusions:    LOS: 17 days       Briant Cedar, MD [facility]  06/09/2020, 11:27 AM

## 2020-06-09 NOTE — Progress Notes (Signed)
[facility] Progress Note  15 Days Post-Op  Subjective: CC-  Comfortable this morning. Daughter came yesterday to learn how to do dressing change. Patient and her mother nervous about going home. Switched to oral cefdinir and flagyl today. Afebrile.  Objective: Vital signs in last 24 hours: Temp:  [97.5 F (36.4 C)-98.6 F (37 C)] 97.5 F (36.4 C) (01/06 0758) Pulse Rate:  [63-65] 65 (01/06 0758) Resp:  [16-18] 18 (01/06 0758) BP: (104-112)/(53-65) 108/65 (01/06 0758) SpO2:  [95 %-96 %] 95 % (01/06 0758) Last BM Date: 06/04/20  Intake/Output from previous day: 01/05 0701 - 01/06 0700 In: 720 [P.O.:720] Out: -  Intake/Output this shift: No intake/output data recorded.  PE: Gen:  Alert, NAD Pulm:  rate and effort normal Abd: Soft, NT/ND Ext:  calves soft and nontender, trace BLE edema Psych: A&Ox4  Skin: warm and dry GU: Right buttock wound measuring ~8cm x 6cm x 6cm with6cm tracking cephaladand 7cm towards right hip. No gross discharge after dressing removed.Periwoundwithout induration or cellulitis. There is some granulation tissue at the base but mostly tan subcutaneous tissue     Lab Results:  Recent Labs    06/07/20 0412 06/08/20 0320  WBC 14.0* 12.3*  HGB 12.1 11.8*  HCT 36.7 35.9*  PLT 565* 502*   BMET Recent Labs    06/07/20 0412 06/08/20 0320  NA 133* 136  K 4.6 4.2  CL 98 97*  CO2 27 28  GLUCOSE 139* 156*  BUN 11 11  CREATININE 1.16* 1.23*  CALCIUM 8.2* 8.3*   PT/INR No results for input(s): LABPROT, INR in the last 72 hours. CMP     Component Value Date/Time   NA 136 06/08/2020 0320   NA 137 07/06/2017 1442   K 4.2 06/08/2020 0320   CL 97 (L) 06/08/2020 0320   CO2 28 06/08/2020 0320   GLUCOSE 156 (H) 06/08/2020 0320   BUN 11 06/08/2020 0320   BUN 12 07/06/2017 1442   CREATININE 1.23 (H) 06/08/2020 0320   CREATININE 0.84 02/02/2016 1517   CALCIUM 8.3 (L) 06/08/2020 0320   PROT 6.7 06/07/2020 0412   ALBUMIN 2.5  (L) 06/07/2020 0412   ALBUMIN 4.4 07/06/2017 1442   AST 14 (L) 06/07/2020 0412   ALT 10 06/07/2020 0412   ALKPHOS 97 06/07/2020 0412   BILITOT 0.5 06/07/2020 0412   GFRNONAA 54 (L) 06/08/2020 0320   GFRNONAA 85 02/02/2016 1517   GFRAA >60 01/20/2019 2216   GFRAA >89 02/02/2016 1517   Lipase     Component Value Date/Time   LIPASE 17 05/23/2020 1549       Studies/Results: No results found.  Anti-infectives: Anti-infectives (From admission, onward)   Start     Dose/Rate Route Frequency Ordered Stop   06/09/20 0800  amoxicillin-clavulanate (AUGMENTIN) 875-125 MG per tablet 1 tablet  Status:  Discontinued        1 tablet Oral Every 12 hours 06/08/20 0934 06/08/20 1002   06/08/20 1400  metroNIDAZOLE (FLAGYL) tablet 500 mg        500 mg Oral Every 12 hours 06/08/20 1012     06/08/20 1400  cefdinir (OMNICEF) capsule 300 mg        300 mg Oral Every 12 hours 06/08/20 1016     06/07/20 2200  linezolid (ZYVOX) tablet 600 mg  Status:  Discontinued        600 mg Oral Every 12 hours 06/07/20 1613 06/08/20 0932   06/02/20 1545  linezolid (ZYVOX) tablet 600 mg  600 mg Oral Every 12 hours 06/02/20 1452 06/05/20 2133   06/01/20 1400  piperacillin-tazobactam (ZOSYN) IVPB 3.375 g  Status:  Discontinued        3.375 g 12.5 mL/hr over 240 Minutes Intravenous Every 8 hours 06/01/20 0945 06/08/20 1005   05/26/20 1400  metroNIDAZOLE (FLAGYL) tablet 500 mg  Status:  Discontinued        500 mg Oral Every 8 hours 05/26/20 0853 06/01/20 0939   05/25/20 2200  vancomycin (VANCOCIN) IVPB 1000 mg/200 mL premix  Status:  Discontinued        1,000 mg 200 mL/hr over 60 Minutes Intravenous Every 12 hours 05/25/20 0918 05/30/20 1129   05/24/20 1800  vancomycin (VANCOREADY) IVPB 750 mg/150 mL  Status:  Discontinued        750 mg 150 mL/hr over 60 Minutes Intravenous Every 24 hours 05/23/20 1914 05/24/20 0811   05/24/20 1400  ceFEPIme (MAXIPIME) 2 g in sodium chloride 0.9 % 100 mL IVPB  Status:   Discontinued        2 g 200 mL/hr over 30 Minutes Intravenous Every 8 hours 05/24/20 0811 06/01/20 0939   05/24/20 0900  vancomycin (VANCOCIN) IVPB 1000 mg/200 mL premix  Status:  Discontinued        1,000 mg 200 mL/hr over 60 Minutes Intravenous Every 12 hours 05/24/20 0811 05/25/20 0918   05/24/20 0800  ceFEPIme (MAXIPIME) 2 g in sodium chloride 0.9 % 100 mL IVPB  Status:  Discontinued        2 g 200 mL/hr over 30 Minutes Intravenous Every 12 hours 05/23/20 1914 05/24/20 0811   05/23/20 2200  clindamycin (CLEOCIN) IVPB 600 mg  Status:  Discontinued        600 mg 100 mL/hr over 30 Minutes Intravenous Every 8 hours 05/23/20 2129 05/26/20 0853   05/23/20 1830  vancomycin (VANCOREADY) IVPB 1500 mg/300 mL        1,500 mg 150 mL/hr over 120 Minutes Intravenous  Once 05/23/20 1813 05/23/20 2229   05/23/20 1800  ceFEPIme (MAXIPIME) 2 g in sodium chloride 0.9 % 100 mL IVPB        2 g 200 mL/hr over 30 Minutes Intravenous  Once 05/23/20 1750 05/23/20 1934   05/23/20 1800  metroNIDAZOLE (FLAGYL) IVPB 500 mg        500 mg 100 mL/hr over 60 Minutes Intravenous  Once 05/23/20 1750 05/23/20 2027   05/23/20 1800  vancomycin (VANCOCIN) IVPB 1000 mg/200 mL premix  Status:  Discontinued        1,000 mg 200 mL/hr over 60 Minutes Intravenous  Once 05/23/20 1750 05/23/20 1813       Assessment/Plan NSTI of the buttock and retroperitoneal abscess - S/P debridement by Dr. Rosendo Gros 12/20&12/22.   T12/29 without residual abscess, there is a decreased small amount of retroperitoneal gas around the right psoas muscle without evidence of abscess. - Cx's with Gardnerella vaginalis, few Prevotella species, beta lactamase positive. Appreciate ID assistance. Recommend "2 weeks of sufficient IV therapy.  Will transition to PO cefdinir and flagyl today to complete 4 week course" - Continue BID wet to dry dressing changes and Hydrotherapy Acute hypoxic ventilator dependent respiratory failure- resolved Hx DM2  -A1c 8.9.SSI. Persistent hyperglycemia but improved. Diabetic coordinator consult. Changes made per their recs. Hx CVA- Plavix restarted 1/1 HLD - crestor Hx of gastric ulcer - Pepcid. Avoid nsaids Mult psych meds - home meds ABL Anemia- hgb stable Obesity BMI 33.79 Enterococcus UTI - completed course of  linezolid FEN-CM diet ID-cultures polymicrobial gram stain,so far only Gardnerella, cover anaerobes, GPC and GNR.vanc 12/20-12/27/cefepime 12/20-12/29, zosyn 12/29-->1/5, cefdinir/flagyl 1/5>> Foley-Out. Voiding VTE-SCDs,Lovenox Dispo- Continue BID wet to dry dressing changes and hydrotherapy today. She has been transitioned to oral antibiotics. Our office has sent referral to Wound clinic in [facility] through [facility] (patient request). I will touch base with social work and see if home health is arranged. once this is set up I think she will be ready for discharge home.   LOS: 17 days    Franne Forts, [facility] 06/09/2020, 8:44 AM Please see Amion for pager number during day hours 7:00am-4:30pm

## 2020-06-09 NOTE — Progress Notes (Addendum)
[facility] cannot accept. They are on a complete hold.  [facility] unable to accept.  [facility] asked to fax referral to [phone]

## 2020-06-09 NOTE — Plan of Care (Signed)
  Problem: Safety: Goal: Ability to remain free from injury will improve Outcome: Progressing   Problem: Pain Managment: Goal: General experience of comfort will improve Outcome: Progressing   Problem: Skin Integrity: Goal: Risk for impaired skin integrity will decrease Outcome: Progressing   

## 2020-06-09 NOTE — Progress Notes (Signed)
Physical Therapy Wound Treatment Patient Details  Name: Kristie Tapia MRN: [id number] Date of Birth: April 12, 1971  Today's Date: 06/09/2020 Time: 1135-1208 Time Calculation (min): 33 min  Subjective  Subjective: Pleasant and agreeable to hydrotherapy Patient and Family Stated Goals: Avoid another surgery Date of Onset: 05/23/20 Prior Treatments: I&D in OR x 2  Pain Score: Premedicated and tolerated well.   Wound Assessment  Wound / Incision (Open or Dehisced) 05/24/20 Incision - Dehisced Buttocks Right (Active)  Dressing Type ABD;Gauze (Comment);Mesh briefs;Moist to dry 06/09/20 1408  Dressing Changed Changed 06/09/20 1408  Dressing Status Clean;Dry;Intact 06/09/20 1408  Dressing Change Frequency Daily 06/09/20 1408  Site / Wound Assessment Yellow;Pink;Brown 06/09/20 1408  % Wound base Red or Granulating 35% 06/09/20 1408  % Wound base Yellow/Fibrinous Exudate 60% 06/09/20 1408  % Wound base Black/Eschar 5% 06/09/20 1408  % Wound base Other/Granulation Tissue (Comment) 0% 06/09/20 1408  Peri-wound Assessment Intact 06/09/20 1408  Wound Length (cm) 9.8 cm 06/06/20 1000  Wound Width (cm) 6 cm 06/06/20 1000  Wound Depth (cm) 10.1 cm 06/06/20 1000  Wound Volume (cm^3) 593.88 cm^3 06/06/20 1000  Wound Surface Area (cm^2) 58.8 cm^2 06/06/20 1000  Tunneling (cm) 5 at 12:00, 4.5v at 1:00-2:00 06/06/20 1000  Margins Unattached edges (unapproximated) 06/09/20 1408  Closure None 06/09/20 1408  Drainage Amount Minimal 06/09/20 1408  Drainage Description Serosanguineous 06/09/20 1408  Non-staged Wound Description Full thickness 06/09/20 1408  Treatment Debridement (Selective);Hydrotherapy (Pulse lavage);Packing (Saline gauze) 06/09/20 1408   Hydrotherapy Pulsed lavage therapy - wound location: rt buttock Pulsed Lavage with Suction (psi): 12 psi Pulsed Lavage with Suction - Normal Saline Used: 1000 mL Pulsed Lavage Tip: Tip with splash shield Selective Debridement Selective Debridement  - Location: rt buttock Selective Debridement - Tools Used: Forceps;Scalpel Selective Debridement - Tissue Removed: yellow and brown unviable tissue   Wound Assessment and Plan  Wound Therapy - Assess/Plan/Recommendations Wound Therapy - Clinical Statement: Periwound area appears improved today with decreased erythema, however scabbing present from pt scratching. Area "crusted" again with barrier film (skin prep) and anti-fungal powder. Debridement of the buttock wound is progressing with visible pink healthy tissue emerging through necrotic adipose. This patient will benefit from continued hydrotherapy for selective removal of unviable tissue, to decrease bioburden, and promote wound bed healing. Wound Therapy - Functional Problem List: Open, painful wound and inadvisable to sit for prolonged periods due to wound location Factors Delaying/Impairing Wound Healing: Infection - systemic/local;Diabetes Mellitus Hydrotherapy Plan: Debridement;Dressing change;Patient/family education;Pulsatile lavage with suction Wound Therapy - Frequency: 6X / week Wound Therapy - Follow Up Recommendations: Home health RN Wound Plan: see above  Wound Therapy Goals- Improve the function of patient's integumentary system by progressing the wound(s) through the phases of wound healing (inflammation - proliferation - remodeling) by: Decrease Necrotic Tissue to: <40% Decrease Necrotic Tissue - Progress: Progressing toward goal Increase Granulation Tissue to: >40% Increase Granulation Tissue - Progress: Progressing toward goal Decrease Length/Width/Depth by (cm): 0/0/1.0cm Decrease Length/Width/Depth - Progress: Progressing toward goal Improve Drainage Characteristics: Min;Serous Improve Drainage Characteristics - Progress: Progressing toward goal Patient/Family will be able to : verbalize wound dressing application to instruct future caregivers Patient/Family Instruction Goal - Progress: Progressing toward  goal Goals/treatment plan/discharge plan were made with and agreed upon by patient/family: Yes Time For Goal Achievement: 7 days Wound Therapy - Potential for Goals: Fair  Goals will be updated until maximal potential achieved or discharge criteria met.  Discharge criteria: when goals achieved, discharge from hospital, MD decision/surgical intervention, no progress  towards goals, refusal/missing three consecutive treatments without notification or medical reason.  GP     Thelma Comp 06/09/2020, 2:15 PM   Rolinda Roan, PT, DPT Acute Rehabilitation Services Pager: [phone] Office: [phone]

## 2020-06-10 LAB — CBC WITH DIFFERENTIAL/PLATELET
Abs Immature Granulocytes: 0.11 10*3/uL — ABNORMAL HIGH (ref 0.00–0.07)
Basophils Absolute: 0.1 10*3/uL (ref 0.0–0.1)
Basophils Relative: 1 %
Eosinophils Absolute: 0.4 10*3/uL (ref 0.0–0.5)
Eosinophils Relative: 4 %
HCT: 36.5 % (ref 36.0–46.0)
Hemoglobin: 11.9 g/dL — ABNORMAL LOW (ref 12.0–15.0)
Immature Granulocytes: 1 %
Lymphocytes Relative: 19 %
Lymphs Abs: 1.9 10*3/uL (ref 0.7–4.0)
MCH: 30.6 pg (ref 26.0–34.0)
MCHC: 32.6 g/dL (ref 30.0–36.0)
MCV: 93.8 fL (ref 80.0–100.0)
Monocytes Absolute: 0.7 10*3/uL (ref 0.1–1.0)
Monocytes Relative: 7 %
Neutro Abs: 6.5 10*3/uL (ref 1.7–7.7)
Neutrophils Relative %: 68 %
Platelets: 423 10*3/uL — ABNORMAL HIGH (ref 150–400)
RBC: 3.89 MIL/uL (ref 3.87–5.11)
RDW: 16 % — ABNORMAL HIGH (ref 11.5–15.5)
WBC: 9.6 10*3/uL (ref 4.0–10.5)
nRBC: 0 % (ref 0.0–0.2)

## 2020-06-10 LAB — GLUCOSE, CAPILLARY
Glucose-Capillary: 137 mg/dL — ABNORMAL HIGH (ref 70–99)
Glucose-Capillary: 176 mg/dL — ABNORMAL HIGH (ref 70–99)
Glucose-Capillary: 215 mg/dL — ABNORMAL HIGH (ref 70–99)

## 2020-06-10 LAB — BASIC METABOLIC PANEL
Anion gap: 10 (ref 5–15)
BUN: 9 mg/dL (ref 6–20)
CO2: 28 mmol/L (ref 22–32)
Calcium: 8.4 mg/dL — ABNORMAL LOW (ref 8.9–10.3)
Chloride: 99 mmol/L (ref 98–111)
Creatinine, Ser: 0.99 mg/dL (ref 0.44–1.00)
GFR, Estimated: >60 mL/min (ref >60)
Glucose, Bld: 142 mg/dL — ABNORMAL HIGH (ref 70–99)
Potassium: 3.9 mmol/L (ref 3.5–5.1)
Sodium: 137 mmol/L (ref 135–145)

## 2020-06-10 MED ORDER — CEFDINIR 300 MG PO CAPS: 300.0000 mg | ORAL_CAPSULE | Freq: Two times a day (BID) | ORAL | 0 refills | Status: AC

## 2020-06-10 MED ORDER — HYDROCODONE-ACETAMINOPHEN 5-325 MG PO TABS: 1.0000 | ORAL_TABLET | Freq: Four times a day (QID) | ORAL | 0 refills | Status: AC | PRN

## 2020-06-10 MED ORDER — METRONIDAZOLE 500 MG PO TABS: 500.0000 mg | ORAL_TABLET | Freq: Two times a day (BID) | ORAL | 0 refills | Status: AC

## 2020-06-10 NOTE — Plan of Care (Signed)

## 2020-06-10 NOTE — Progress Notes (Signed)
Patient given discharge instructions and stated understanding.  She is waiting for IV therapy to remove her pick line to leave.

## 2020-06-10 NOTE — Plan of Care (Signed)
Patient being discharged

## 2020-06-10 NOTE — Progress Notes (Signed)
Consult NOTE    Kristie Tapia  WPY:[id number] DOB: 21-Aug-1970 DOA: 05/23/2020 PCP: System, Provider Not In    Brief Narrative:  Kristie Tapia an 50 y.o.femalewith pmh uncontrolled diabetes mellitus type 2, CVA, OSA, and chronic pain who was admitted on 12/20 for necrotizing fasciitis of the R buttock.She is awake and alert. She reported that she started with a boil on her buttock and tried to pop it. It spread and she tried to lance it without success. She as having abdominal pain as well and went to urgent care and eventually had a CT showing severe infection and so came in and had debridement.   Hospitalist were consulted for assistance with medical management   Today, patient denied any new complaints   Assessment & Plan:   Principal Problem:   Necrotizing soft tissue infection Active Problems:   Dyslipidemia   Diabetes (HCC)   Smoker unmotivated to quit   CVA (cerebral vascular accident) (HCC)   Obesity   Chronic musculoskeletal pain   Necrotizing soft tissue infection of buttock  Currently afebrile, with downtrending leukocytosis Status post I&D on 12/20 and 12/22  Wound care per surgery, hydrotherapy Cultures positive for Prevotella (+beta lactamase) and gardnerella vaginalis Discharge on p.o. cefdinir, Flagyl to complete 4-week course as per ID, also D/C on Diflucan every 72 hours x 3 doses  Diabetes mellitus type 2 (uncontrolled) Hemoglobin A1c 8.9 Continue Home regimen  Vanc Resistant Enterococcus UTI Completed linezolid x 5 days per ID  Hypoalbuminemia/moderate protein calorie malnutrition Prealbumin 10.3 Glucerna supplementation in between meals  Chronic diastolic dysfunction Last EF noted to be 55 to 60% with grade 1 diastolic dysfunction in 2019 BNP 329 Continue home Lasix dose  GERD Continue PPI twice daily  Superficial Venous Thrombosis Bilateral upper extremity cephalic vein superficial vein thrombosis   History of  CVA Continue plavix  Depression and anxiety Continue current regimen  Early onset dementia Continue Aricept   DVT prophylaxis: lovenox Code Status: full  Family Communication: None at bedside Disposition:   Status is: Inpatient  Remains inpatient appropriate because:Inpatient level of care appropriate due to severity of illness   Dispo: The patient is from: Home              Anticipated d/c is to: Home              Anticipated d/c date is: 06/10/20              Patient currently is not medically stable to d/c.   Consultants:   TRH consulting  ID  General surgery is primary  Procedures:  I&D x 2  Antimicrobials:  Anti-infectives (From admission, onward)   Start     Dose/Rate Route Frequency Ordered Stop   06/10/20 0000  metroNIDAZOLE (FLAGYL) 500 MG tablet        500 mg Oral 2 times daily 06/10/20 0936 06/25/20 2359   06/10/20 0000  cefdinir (OMNICEF) 300 MG capsule        300 mg Oral 2 times daily 06/10/20 0936 06/25/20 2359   06/09/20 0800  amoxicillin-clavulanate (AUGMENTIN) 875-125 MG per tablet 1 tablet  Status:  Discontinued        1 tablet Oral Every 12 hours 06/08/20 0934 06/08/20 1002   06/09/20 0000  fluconazole (DIFLUCAN) 150 MG tablet        150 mg Oral every 72 hours 06/09/20 1132 06/16/20 2359   06/08/20 1400  metroNIDAZOLE (FLAGYL) tablet 500 mg  500 mg Oral Every 12 hours 06/08/20 1012     06/08/20 1400  cefdinir (OMNICEF) capsule 300 mg        300 mg Oral Every 12 hours 06/08/20 1016     06/07/20 2200  linezolid (ZYVOX) tablet 600 mg  Status:  Discontinued        600 mg Oral Every 12 hours 06/07/20 1613 06/08/20 0932   06/02/20 1545  linezolid (ZYVOX) tablet 600 mg        600 mg Oral Every 12 hours 06/02/20 1452 06/05/20 2133   06/01/20 1400  piperacillin-tazobactam (ZOSYN) IVPB 3.375 g  Status:  Discontinued        3.375 g 12.5 mL/hr over 240 Minutes Intravenous Every 8 hours 06/01/20 0945 06/08/20 1005   05/26/20 1400  metroNIDAZOLE  (FLAGYL) tablet 500 mg  Status:  Discontinued        500 mg Oral Every 8 hours 05/26/20 0853 06/01/20 0939   05/25/20 2200  vancomycin (VANCOCIN) IVPB 1000 mg/200 mL premix  Status:  Discontinued        1,000 mg 200 mL/hr over 60 Minutes Intravenous Every 12 hours 05/25/20 0918 05/30/20 1129   05/24/20 1800  vancomycin (VANCOREADY) IVPB 750 mg/150 mL  Status:  Discontinued        750 mg 150 mL/hr over 60 Minutes Intravenous Every 24 hours 05/23/20 1914 05/24/20 0811   05/24/20 1400  ceFEPIme (MAXIPIME) 2 g in sodium chloride 0.9 % 100 mL IVPB  Status:  Discontinued        2 g 200 mL/hr over 30 Minutes Intravenous Every 8 hours 05/24/20 0811 06/01/20 0939   05/24/20 0900  vancomycin (VANCOCIN) IVPB 1000 mg/200 mL premix  Status:  Discontinued        1,000 mg 200 mL/hr over 60 Minutes Intravenous Every 12 hours 05/24/20 0811 05/25/20 0918   05/24/20 0800  ceFEPIme (MAXIPIME) 2 g in sodium chloride 0.9 % 100 mL IVPB  Status:  Discontinued        2 g 200 mL/hr over 30 Minutes Intravenous Every 12 hours 05/23/20 1914 05/24/20 0811   05/23/20 2200  clindamycin (CLEOCIN) IVPB 600 mg  Status:  Discontinued        600 mg 100 mL/hr over 30 Minutes Intravenous Every 8 hours 05/23/20 2129 05/26/20 0853   05/23/20 1830  vancomycin (VANCOREADY) IVPB 1500 mg/300 mL        1,500 mg 150 mL/hr over 120 Minutes Intravenous  Once 05/23/20 1813 05/23/20 2229   05/23/20 1800  ceFEPIme (MAXIPIME) 2 g in sodium chloride 0.9 % 100 mL IVPB        2 g 200 mL/hr over 30 Minutes Intravenous  Once 05/23/20 1750 05/23/20 1934   05/23/20 1800  metroNIDAZOLE (FLAGYL) IVPB 500 mg        500 mg 100 mL/hr over 60 Minutes Intravenous  Once 05/23/20 1750 05/23/20 2027   05/23/20 1800  vancomycin (VANCOCIN) IVPB 1000 mg/200 mL premix  Status:  Discontinued        1,000 mg 200 mL/hr over 60 Minutes Intravenous  Once 05/23/20 1750 05/23/20 1813      Objective: Vitals:   06/09/20 0758 06/09/20 1500 06/09/20 2233 06/10/20  0400  BP: 108/65 102/61 (!) 129/58 114/60  Pulse: 65 62 61 (!) 59  Resp: 18 18 17 15   Temp: (!) 97.5 F (36.4 C) 98.3 F (36.8 C) 98.4 F (36.9 C) 98.2 F (36.8 C)  TempSrc: Oral Oral Oral Oral  SpO2: 95% 98% 98% 92%  Weight:      Height:        Intake/Output Summary (Last 24 hours) at 06/10/2020 1142 Last data filed at 06/10/2020 0900 Gross per 24 hour  Intake 720 ml  Output --  Net 720 ml   Filed Weights   05/23/20 1542 05/24/20 0200  Weight: 87.1 kg 92.1 kg    Examination:  General: NAD   Cardiovascular: S1, S2 present  Respiratory: CTAB  Abdomen: Soft, nontender, nondistended, bowel sounds present  Musculoskeletal: No bilateral pedal edema noted  Skin:  Gluteal wound dressing C/D/I  Psychiatry: Normal mood    Data Reviewed: I have personally reviewed following labs and imaging studies  CBC: Recent Labs  Lab 06/04/20 0345 06/05/20 0418 06/06/20 0425 06/07/20 0412 06/08/20 0320 06/10/20 0410  WBC 17.8* 17.7* 16.1* 14.0* 12.3* 9.6  NEUTROABS 13.6* 13.6* 12.3* 10.6*  --  6.5  HGB 12.1 11.9* 12.4 12.1 11.8* 11.9*  HCT 37.2 37.0 38.5 36.7 35.9* 36.5  MCV 92.5 93.9 93.2 93.4 94.0 93.8  PLT 510* 537* 562* 565* 502* 423*    Basic Metabolic Panel: Recent Labs  Lab 06/04/20 0345 06/05/20 0418 06/06/20 0425 06/07/20 0412 06/08/20 0320 06/10/20 0410  NA 138 135 136 133* 136 137  K 4.0 4.4 4.4 4.6 4.2 3.9  CL 97* 97* 96* 98 97* 99  CO2 32 30 28 27 28 28   GLUCOSE 143* 136* 152* 139* 156* 142*  BUN 11 15 12 11 11 9   CREATININE 1.13* 1.14* 1.28* 1.16* 1.23* 0.99  CALCIUM 8.2* 8.0* 8.3* 8.2* 8.3* 8.4*  MG 2.1 2.0 2.1 2.1  --   --   PHOS 4.4 4.5 5.1* 4.2  --   --     GFR: Estimated Creatinine Clearance: 77 mL/min (by C-G formula based on SCr of 0.99 mg/dL).  Liver Function Tests: Recent Labs  Lab 06/04/20 0345 06/05/20 0418 06/06/20 0425 06/07/20 0412  AST 17 15 17  14*  ALT 10 10 11 10   ALKPHOS 104 90 106 97  BILITOT 0.6 0.6 0.8 0.5   PROT 6.0* 6.3* 6.8 6.7  ALBUMIN 2.2* 2.3* 2.5* 2.5*    CBG: Recent Labs  Lab 06/09/20 1210 06/09/20 1648 06/09/20 2023 06/10/20 0531 06/10/20 1026  GLUCAP 254* 115* 123* 137* 176*     Recent Results (from the past 240 hour(s))  Culture, Urine     Status: Abnormal   Collection Time: 06/01/20  5:06 PM   Specimen: Urine, Random  Result Value Ref Range Status   Specimen Description URINE, RANDOM  Final   Special Requests   Final    NONE Performed at [facility], [place] [address] [place], [place] [address]    Culture (A)  Final    >=100,000 COLONIES/mL VANCOMYCIN RESISTANT ENTEROCOCCUS   Report Status 06/03/2020 FINAL  Final   Organism ID, Bacteria VANCOMYCIN RESISTANT ENTEROCOCCUS (A)  Final      Susceptibility   Vancomycin resistant enterococcus - MIC*    AMPICILLIN >=32 RESISTANT Resistant     NITROFURANTOIN 32 SENSITIVE Sensitive     VANCOMYCIN >=32 RESISTANT Resistant     LINEZOLID 2 SENSITIVE Sensitive     * >=100,000 COLONIES/mL VANCOMYCIN RESISTANT ENTEROCOCCUS         Radiology Studies: No results found.      Scheduled Meds: . cefdinir  300 mg Oral Q12H  . Chlorhexidine Gluconate Cloth  6 each Topical Daily  . clopidogrel  75 mg Oral Daily  .  donepezil  10 mg Oral Daily  . enoxaparin (LOVENOX) injection  40 mg Subcutaneous Q24H  . escitalopram  20 mg Oral QHS  . famotidine  40 mg Oral QHS  . feeding supplement (GLUCERNA SHAKE)  237 mL Oral BID BM  . furosemide  20 mg Oral Daily  . insulin aspart  0-15 Units Subcutaneous TID WC & HS  . insulin aspart  7 Units Subcutaneous TID WC  . insulin glargine  22 Units Subcutaneous Daily  . metroNIDAZOLE  500 mg Oral Q12H  . multivitamin with minerals  1 tablet Oral Daily  . pantoprazole  40 mg Oral BID  . polycarbophil  625 mg Oral Daily  . pregabalin  100 mg Oral q AM  . pregabalin  200 mg Oral QHS  . Ensure Max Protein  11 oz Oral QHS  . rosuvastatin  20 mg Oral QHS  . saccharomyces boulardii   250 mg Oral BID  . sodium chloride flush  10-40 mL Intracatheter Q12H  . traZODone  150 mg Oral QHS   Continuous Infusions:    LOS: 18 days       Briant Cedar, MD [facility]  06/10/2020, 11:42 AM

## 2020-06-10 NOTE — Discharge Summary (Signed)
[facility] Discharge Summary   Patient ID: Tristine Langi MRN: [id number] DOB/AGE: [birth year] 50 y.o.  Admit date: 05/23/2020 Discharge date: 06/10/2020  Admitting Diagnosis: Necrotizing soft tissue infection History of CVA Anxiety Fibromyalgia Diabetes type 2  Discharge Diagnosis NSTI of the buttock and retroperitoneal abscess Acute hypoxic ventilator dependent respiratory failure Hx DM2 Hx CVA HLD Hx of gastric ulcer Mult psych meds ABL Anemia Obesity BMI 33.79 Enterococcus UTI   Consultants Infectious disease TRH  Imaging: No results found.  Procedures Dr. Rosendo Gros (05/23/2021) - IRRIGATION AND DEBRIDEMENT BUTTOCKS ABSCESS and retroperitoneum  Dr. Rosendo Gros (05/25/2021) - INCISION AND DEBRIDEMENT OF RIGHT BUTTOCK NECROTIZING SOFT TISSUE INFECTION, excise approximately 5 x 5 cm of subcutaneous necrotic tissue.  There is no viable tissue excised  Hospital Course:  Kristie Tapia is a 50yo female with multiple medical problems including hx CVA on plavix, DM, HLD, fibromyalgia, obesity who presented to [facility] 12/20 complaining of right buttock and abdominal pain and fever. She states that the pain to the right gluteal area became swollen.  She tried to allow this area to drain however it never did.  Upon evaluation the ER she underwent CT scan.  CT scan was significant for necrotizing soft tissue infection to the right buttock, with air tracking above the pelvic wall and into the right retroperitoneal/perinephric space. She was also noted to have a leukocytosis and elevated lactate. Patient was started on broad spectrum antibiotics and taken to the OR that evening for irrigation and debridement buttocks abscess and retroperitoneum. She was admitted to the ICU postoperatively and kept on the ventilator. She was taken back to the OR on 12/22 for further debridement (excise approximately 5 x 5 cm of subcutaneous necrotic tissue). Successfully extubated 12/22.  Intraoperative cultures grew Gardnerella vaginalis, few Prevotella species, beta lactamase positive. Infectious disease was consulted for assistance with antibiotic management and ultimately recommended a 4 week course of antibiotics. She completed 2 weeks of sufficient IV therapy during admission and was discharged home on 2 weeks of cefdinir and flagyl.  Wound was managed with dressing changes and hydrotherapy. Hospitalist service consulted for assistance with management of her multiple medical problems. On 1/7, the patient was voiding well, tolerating diet, ambulating well, pain well controlled, WBC normalized, vital signs stable, wound stable and felt stable for discharge home.  Patient will follow up as below and knows to call with questions or concerns.    I have personally reviewed the patients medication history on the [place] controlled substance database.    Physical Exam: Gen: Alert, NAD Pulm: rate and effort normal Abd: Soft, NT/ND KYH:CWCBJS soft and nontender, trace BLE edema Psych: A&Ox4  Skin: warm and dry EG:BTDVV buttock wound measuring~8cm x 6cm x 6cm with6cm tracking cephaladand7cm towards right hip. No gross discharge after dressing removed.Periwoundwithout induration or cellulitis. There is some granulation tissue at the base but mostly tan subcutaneous tissue     Allergies as of 06/10/2020      Reactions   Sulfa Antibiotics Other (See Comments)   Burns the inside of my mouth; "blisters; leaves tongue and inside of mouth solid red"   Tape Other (See Comments)   Blisters- USE PAPER TAPE ONLY   Decongestant [pseudoephedrine Hcl Er] Itching   ALL DECONGESTANTS   Duloxetine Other (See Comments)   Felt "out of it"   Fremanezumab-vfrm Other (See Comments), Nausea Only   Tizanidine Other (See Comments)   headache   Chlorpheniramine Itching   Red Dye Other (See Comments)   CAUSES  MIGRAINES   Eggs Or Egg-derived Products Nausea And Vomiting   Latex Itching    Percocet [oxycodone-acetaminophen] Other (See Comments)   PT reports having nightmares when taking Percocet   Prednisone Other (See Comments)   "Gives me a bad attitude"   Zoloft [sertraline Hcl] Rash      Medication List    TAKE these medications   acetaminophen 500 MG tablet Commonly known as: TYLENOL Take 1,000 mg by mouth every 6 (six) hours as needed for fever (for headaches).   cefdinir 300 MG capsule Commonly known as: OMNICEF Take 1 capsule (300 mg total) by mouth 2 (two) times daily for 15 days.   clopidogrel 75 MG tablet Commonly known as: PLAVIX Take 1 tablet by mouth once daily   cyanocobalamin 1000 MCG/ML injection Commonly known as: (VITAMIN B-12) Inject 1,000 mcg into the muscle every Saturday.   diphenoxylate-atropine 2.5-0.025 MG tablet Commonly known as: LOMOTIL Take 1 tablet by mouth daily.   donepezil 10 MG tablet Commonly known as: ARICEPT Take 10 mg by mouth daily.   escitalopram 20 MG tablet Commonly known as: LEXAPRO Take 20 mg by mouth at bedtime.   famotidine 40 MG tablet Commonly known as: PEPCID Take 40 mg by mouth at bedtime.   fluconazole 150 MG tablet Commonly known as: Diflucan Take 1 tablet (150 mg total) by mouth every 3 (three) days for 3 doses.   fluticasone 50 MCG/ACT nasal spray Commonly known as: FLONASE Place 1-2 sprays into both nostrils daily as needed for allergies or rhinitis.   FreeStyle Libre 14 Day Sensor Misc 1 each by Other route every 14 (fourteen) days. Use to monitor glucose levels. Change sensor every 14 days   furosemide 20 MG tablet Commonly known as: LASIX Take 1 tablet (20 mg total) by mouth daily as needed for fluid or edema (feet or legs).   HYDROcodone-acetaminophen 5-325 MG tablet Commonly known as: NORCO/VICODIN Take 1 tablet by mouth every 6 (six) hours as needed for moderate pain or severe pain.   ibuprofen 200 MG tablet Commonly known as: ADVIL Take 800 mg by mouth every 6 (six) hours as  needed for fever or headache.   insulin lispro 100 UNIT/ML KwikPen Commonly known as: HUMALOG Inject 10-15 Units into the skin 2 (two) times daily with a meal.   LORazepam 1 MG tablet Commonly known as: ATIVAN TAKE 1 TABLET BY MOUTH TWICE DAILY AS NEEDED FOR ANXIETY What changed:   how much to take  how to take this  when to take this  reasons to take this  additional instructions   metroNIDAZOLE 500 MG tablet Commonly known as: Flagyl Take 1 tablet (500 mg total) by mouth 2 (two) times daily for 15 days.   multivitamin with minerals Tabs tablet Take 1 tablet by mouth daily.   nitroGLYCERIN 0.4 MG SL tablet Commonly known as: NITROSTAT Place 0.4 mg under the tongue every 5 (five) minutes as needed for chest pain.   pantoprazole 40 MG tablet Commonly known as: PROTONIX Take 40 mg by mouth 2 (two) times daily.   pioglitazone 45 MG tablet Commonly known as: ACTOS Take 45 mg by mouth daily.   pregabalin 100 MG capsule Commonly known as: LYRICA Take 100-200 mg by mouth See admin instructions. Take 1 tablet (100 mg) by mouth every morning and 2 tablets (200 mg) at night   promethazine 25 MG tablet Commonly known as: PHENERGAN Take 25 mg by mouth every 6 (six) hours as needed for nausea.  rizatriptan 10 MG tablet Commonly known as: MAXALT Take 10 mg by mouth once as needed for migraine. Max 2 tablets/week   rosuvastatin 20 MG tablet Commonly known as: CRESTOR Take 20 mg by mouth at bedtime.   traZODone 100 MG tablet Commonly known as: DESYREL Take 1.5 tablets (150 mg total) by mouth at bedtime. What changed: how much to take   V-Go 40 Kit 1 Device by Does not apply route daily.   Ventolin HFA 108 (90 Base) MCG/ACT inhaler Generic drug: albuterol INHALE 2 PUFFS BY MOUTH EVERY 6 HOURS AS NEEDED FOR WHEEZING FOR SHORTNESS OF BREATH What changed: See the new instructions.            Durable Medical Equipment  (From admission, onward)         Start      Ordered   06/06/20 1046  For home use only DME Walker rolling  Once       Question Answer Comment  Walker: With 5 Inch Wheels   Patient needs a walker to treat with the following condition Necrotizing soft tissue infection      06/06/20 1046            Follow-up Information    [facility]. Call.   Why: We have sent a referral to their office, they should contact you with an appointment Contact information: Address: [address] [place], [place] [address]  Phone: [phone]       Primary care physician Follow up.   Why: Follow up next week as scheduled with your primary care physician       [facility] Call.   Specialty: General Surgery Why: Call as needed. If unable to be seen at the wound center within a couple weeks of discharge, please call our office for appointment in the mean time for wound check Contact information: [address] [place] [phone]              Signed: Wellington Hampshire, [facility] 06/10/2020, 9:37 AM Please see Amion for pager number during day hours 7:00am-4:30pm

## 2020-06-10 NOTE — TOC Progression Note (Addendum)
Transition of Care [facility]) - Progression Note    Patient Details  Name: Kristie Tapia MRN: [id number] Date of Birth: 02-26-1971  Transition of Care [facility]) CM/SW Contact  Erin Sons, [place] Phone Number: 06/10/2020, 9:31 AM  Clinical Narrative:     Referral made to [facility] with [facility]. She stated they would likely be able to accept. Will confirm with CSW once reviewed.  [facility] cannot accept. [facility] cannot assist.   1324: [facility] is able to accept for [facility] RN start of care next week which PA is okay with. Pt will discharge home. Daughter will transport.   Expected Discharge Plan: Home w Home Health Services Barriers to Discharge: Continued Medical Work up  Expected Discharge Plan and Services Expected Discharge Plan: Home w Home Health Services       Living arrangements for the past 2 months: Single Family Home                                       Social Determinants of Health (SDOH) Interventions    Readmission Risk Interventions No flowsheet data found.

## 2020-06-10 NOTE — Progress Notes (Addendum)
Physical Therapy Wound Treatment/DC note Patient Details  Name: Kristie Tapia MRN: [id number] Date of Birth: 01/22/1971  Today's Date: 06/10/2020 Time: 0912-0935 Time Calculation (min): 23 min  Subjective  Subjective: Pleasant and agreeable to hydrotherapy Patient and Family Stated Goals: Avoid another surgery Date of Onset: 05/23/20 Prior Treatments: I&D in OR x 2  Pain Score: Pain Score: Pt able to tolerate pulse lavage and dressing change without premedication.   Wound Assessment  Wound / Incision (Open or Dehisced) 05/24/20 Incision - Dehisced Buttocks Right (Active)  Dressing Type ABD;Gauze (Comment) 06/10/20 1000  Dressing Changed Changed 06/10/20 1000  Dressing Status Clean;Dry;Intact 06/10/20 1000  Dressing Change Frequency Daily 06/10/20 1000  Site / Wound Assessment Yellow;Pink 06/10/20 1000  % Wound base Red or Granulating 40% 06/10/20 1000  % Wound base Yellow/Fibrinous Exudate 60% 06/10/20 1000  % Wound base Black/Eschar 0% 06/10/20 1000  % Wound base Other/Granulation Tissue (Comment) 0% 06/10/20 1000  Peri-wound Assessment Intact 06/10/20 1000  Wound Length (cm) 9.8 cm 06/06/20 1000  Wound Width (cm) 6 cm 06/06/20 1000  Wound Depth (cm) 10.1 cm 06/06/20 1000  Wound Volume (cm^3) 593.88 cm^3 06/06/20 1000  Wound Surface Area (cm^2) 58.8 cm^2 06/06/20 1000  Tunneling (cm) 5 at 12:00, 4.5v at 1:00-2:00 06/06/20 1000  Margins Unattached edges (unapproximated) 06/10/20 1000  Closure None 06/10/20 1000  Drainage Amount Minimal 06/10/20 1000  Drainage Description Serosanguineous 06/10/20 1000  Non-staged Wound Description Full thickness 06/10/20 1000  Treatment Hydrotherapy (Pulse lavage);Packing (Saline gauze) 06/10/20 1000   Hydrotherapy Pulsed lavage therapy - wound location: rt buttock Pulsed Lavage with Suction (psi): 8 psi (4-8) Pulsed Lavage with Suction - Normal Saline Used: 1000 mL Pulsed Lavage Tip: Tip with splash shield   Wound Assessment and Plan   Wound Therapy - Assess/Plan/Recommendations Wound Therapy - Clinical Statement: Wound continues to improve. Pt to dc home today with follow up at [place] - Functional Problem List: Open, painful wound and inadvisable to sit for prolonged periods due to wound location Factors Delaying/Impairing Wound Healing: Infection - systemic/local;Diabetes Mellitus Hydrotherapy Plan: Debridement;Dressing change;Patient/family education;Pulsatile lavage with suction Wound Therapy - Frequency: 6X / week Wound Therapy - Follow Up Recommendations: Home health RN;Wound [place] Wound Plan: see above  Wound Therapy Goals- Improve the function of patient's integumentary system by progressing the wound(s) through the phases of wound healing (inflammation - proliferation - remodeling) by: Decrease Necrotic Tissue to: <40% Decrease Necrotic Tissue - Progress: Progressing toward goal Increase Granulation Tissue to: >40% Increase Granulation Tissue - Progress: Progressing toward goal Decrease Length/Width/Depth by (cm): 0/0/1.0cm Decrease Length/Width/Depth - Progress: Progressing toward goal Improve Drainage Characteristics: Min;Serous Improve Drainage Characteristics - Progress: Progressing toward goal Patient/Family will be able to : verbalize wound dressing application to instruct future caregivers Patient/Family Instruction Goal - Progress: Progressing toward goal  Goals will be updated until maximal potential achieved or discharge criteria met.  Discharge criteria: when goals achieved, discharge from hospital, MD decision/surgical intervention, no progress towards goals, refusal/missing three consecutive treatments without notification or medical reason.  GP     Shary Decamp [place] 06/10/2020, 10:09 AM [place] Pager [phone] Office [phone]

## 2020-12-31 ENCOUNTER — Encounter (HOSPITAL_COMMUNITY): Payer: Self-pay | Admitting: Emergency Medicine

## 2020-12-31 ENCOUNTER — Emergency Department (HOSPITAL_COMMUNITY)
Admission: EM | Admit: 2020-12-31 | Discharge: 2020-12-31 | Disposition: A | Discharge status: Home / Self Care | Payer: Medicare (Managed Care) | Attending: Physician Assistant | Admitting: Physician Assistant

## 2020-12-31 ENCOUNTER — Other Ambulatory Visit: Payer: Self-pay

## 2020-12-31 DIAGNOSIS — R1030 Lower abdominal pain, unspecified: Secondary | ICD-10-CM | POA: Diagnosis not present

## 2020-12-31 DIAGNOSIS — R102 Pelvic and perineal pain: Secondary | ICD-10-CM | POA: Diagnosis not present

## 2020-12-31 DIAGNOSIS — Z5321 Procedure and treatment not carried out due to patient leaving prior to being seen by health care provider: Secondary | ICD-10-CM | POA: Insufficient documentation

## 2020-12-31 LAB — CBC WITH DIFFERENTIAL/PLATELET
Abs Immature Granulocytes: 0.15 10*3/uL — ABNORMAL HIGH (ref 0.00–0.07)
Basophils Absolute: 0.2 10*3/uL — ABNORMAL HIGH (ref 0.0–0.1)
Basophils Relative: 1 %
Eosinophils Absolute: 0.5 10*3/uL (ref 0.0–0.5)
Eosinophils Relative: 4 %
HCT: 53 % — ABNORMAL HIGH (ref 36.0–46.0)
Hemoglobin: 17.8 g/dL — ABNORMAL HIGH (ref 12.0–15.0)
Immature Granulocytes: 1 %
Lymphocytes Relative: 16 %
Lymphs Abs: 2.3 10*3/uL (ref 0.7–4.0)
MCH: 29.1 pg (ref 26.0–34.0)
MCHC: 33.6 g/dL (ref 30.0–36.0)
MCV: 86.6 fL (ref 80.0–100.0)
Monocytes Absolute: 0.8 10*3/uL (ref 0.1–1.0)
Monocytes Relative: 6 %
Neutro Abs: 10.6 10*3/uL — ABNORMAL HIGH (ref 1.7–7.7)
Neutrophils Relative %: 72 %
Platelets: 415 10*3/uL — ABNORMAL HIGH (ref 150–400)
RBC: 6.12 MIL/uL — ABNORMAL HIGH (ref 3.87–5.11)
RDW: 17.6 % — ABNORMAL HIGH (ref 11.5–15.5)
WBC: 14.6 10*3/uL — ABNORMAL HIGH (ref 4.0–10.5)
nRBC: 0 % (ref 0.0–0.2)

## 2020-12-31 LAB — BASIC METABOLIC PANEL
Anion gap: 9 (ref 5–15)
BUN: 19 mg/dL (ref 6–20)
CO2: 31 mmol/L (ref 22–32)
Calcium: 9.3 mg/dL (ref 8.9–10.3)
Chloride: 98 mmol/L (ref 98–111)
Creatinine, Ser: 1.19 mg/dL — ABNORMAL HIGH (ref 0.44–1.00)
GFR, Estimated: 56 mL/min — ABNORMAL LOW (ref >60)
Glucose, Bld: 153 mg/dL — ABNORMAL HIGH (ref 70–99)
Potassium: 4 mmol/L (ref 3.5–5.1)
Sodium: 138 mmol/L (ref 135–145)

## 2020-12-31 NOTE — ED Provider Notes (Signed)
Emergency Medicine Provider Triage Evaluation Note  Kristie Tapia , a 50 y.o. female  was evaluated in triage.  Pt complains of abscess on her right leg/vaginal area x1 week..  Patient has history of necrotizing fasciitis in December 2021 was hospitalized here at [facility], wound on her buttock.  She is concerned and wants to get checked today to make sure she does not have the infection again.  She is also endorsing a week of diarrhea.  She has a history of type 2 diabetes.  She states she always has chills, unsure if she had any fever.  Review of Systems  Positive: wound Negative: Abdominal pain, nausea, vomiting  Physical Exam  BP 134/72 (BP Location: Right Arm)   Pulse 69   Temp 98.2 F (36.8 C) (Oral)   Resp 18   SpO2 94%  Gen:   Awake, no distress   Resp:  Normal effort  MSK:   Moves extremities without difficulty  Other:  Abdominal tenderness.  Medical Decision Making  Medically screening exam initiated at 6:03 PM.  Appropriate orders placed.  Kristie Tapia was informed that the remainder of the evaluation will be completed by another provider, this initial triage assessment does not replace that evaluation, and the importance of remaining in the ED until their evaluation is complete.  Unable to see the area of abscess here in the triage area.  Basic labs ordered.   Portions of this note were generated with Scientist, clinical (histocompatibility and immunogenetics). Dictation errors may occur despite best attempts at proofreading.    Shanon Ace, PA-C 12/31/20 1806    Terald Sleeper, MD 01/01/21 [phone]

## 2020-12-31 NOTE — ED Triage Notes (Signed)
Reports sore on vaginal area x 1 week with pain radiating to lower abd.  Denies fever/chills.

## 2021-08-17 ENCOUNTER — Other Ambulatory Visit: Payer: Self-pay

## 2021-08-17 ENCOUNTER — Ambulatory Visit
Admission: RE | Admit: 2021-08-17 | Discharge: 2021-08-17 | Disposition: A | Discharge status: Home / Self Care | Payer: Medicare HMO | Source: Ambulatory Visit | Attending: Student | Admitting: Student

## 2021-08-17 ENCOUNTER — Other Ambulatory Visit: Payer: Self-pay | Admitting: Student

## 2021-08-17 MED ORDER — IOPAMIDOL (ISOVUE-300) INJECTION 61%
100.0000 mL | Freq: Once | INTRAVENOUS | Status: AC | PRN
  Administered 2021-08-17: 100 mL via INTRAVENOUS

## 2022-04-30 ENCOUNTER — Emergency Department (HOSPITAL_COMMUNITY)
Admission: EM | Admit: 2022-04-30 | Discharge: 2022-04-30 | Disposition: A | Discharge status: Home / Self Care | Payer: Medicare HMO | Attending: Emergency Medicine | Admitting: Emergency Medicine

## 2022-04-30 DIAGNOSIS — Z91040 Latex allergy status: Secondary | ICD-10-CM | POA: Insufficient documentation

## 2022-04-30 DIAGNOSIS — Z7984 Long term (current) use of oral hypoglycemic drugs: Secondary | ICD-10-CM | POA: Diagnosis not present

## 2022-04-30 DIAGNOSIS — Z7902 Long term (current) use of antithrombotics/antiplatelets: Secondary | ICD-10-CM | POA: Diagnosis not present

## 2022-04-30 DIAGNOSIS — G8929 Other chronic pain: Secondary | ICD-10-CM | POA: Diagnosis not present

## 2022-04-30 DIAGNOSIS — Z794 Long term (current) use of insulin: Secondary | ICD-10-CM | POA: Insufficient documentation

## 2022-04-30 DIAGNOSIS — M545 Low back pain, unspecified: Secondary | ICD-10-CM | POA: Insufficient documentation

## 2022-04-30 DIAGNOSIS — E119 Type 2 diabetes mellitus without complications: Secondary | ICD-10-CM | POA: Diagnosis not present

## 2022-04-30 LAB — URINALYSIS, ROUTINE W REFLEX MICROSCOPIC
Bilirubin Urine: NEGATIVE
Glucose, UA: 250 mg/dL — AB
Hgb urine dipstick: NEGATIVE
Ketones, ur: NEGATIVE mg/dL
Leukocytes,Ua: NEGATIVE
Nitrite: NEGATIVE
Protein, ur: NEGATIVE mg/dL
Specific Gravity, Urine: <1.005 — ABNORMAL LOW (ref 1.005–1.030)
pH: 6 (ref 5.0–8.0)

## 2022-04-30 MED ORDER — KETOROLAC TROMETHAMINE 15 MG/ML IJ SOLN: 15.0000 mg | Freq: Once | INTRAMUSCULAR | Status: DC

## 2022-04-30 MED ORDER — CYCLOBENZAPRINE HCL 10 MG PO TABS: 10.0000 mg | ORAL_TABLET | Freq: Two times a day (BID) | ORAL | 0 refills | Status: DC | PRN

## 2022-04-30 NOTE — ED Triage Notes (Signed)
The pt is here for lower back pain and rt hip pain  the pt is c/o the pain for one week worse since yesterday  no known injury

## 2022-04-30 NOTE — ED Provider Notes (Signed)
[facility] Provider Note   CSN: [id number] Arrival date & time: 04/30/22  6384     History  Chief Complaint  Patient presents with   Back Pain    Kristie Tapia is a 51 y.o. female.   Back Pain  The patient is a 51 year old female with medical history of chronic back, prior CVA, anxiety, fibromyalgia, T2DM presenting for evaluation of right-sided lower back pain which has been worsening over the past few days.  The patient has right lumbar pain which she states radiates around her right flank to her suprapubic region.  She has no chest pain, shortness of breath, urinary symptoms, nausea, vomiting, numbness, weakness, saddle anesthesia, falls.    Home Medications Prior to Admission medications   Medication Sig Start Date End Date Taking? Authorizing Provider  cyclobenzaprine (FLEXERIL) 10 MG tablet Take 1 tablet (10 mg total) by mouth 2 (two) times daily as needed for muscle spasms. 04/30/22  Yes Dani Gobble, MD  acetaminophen (TYLENOL) 500 MG tablet Take 1,000 mg by mouth every 6 (six) hours as needed for fever (for headaches).    [provider]  clopidogrel (PLAVIX) 75 MG tablet Take 1 tablet by mouth once daily Patient taking differently: Take 75 mg by mouth daily. 05/28/19   Daleen Squibb, MD  Continuous Blood Gluc Sensor (FREESTYLE LIBRE 14 DAY SENSOR) MISC 1 each by Other route every 14 (fourteen) days. Use to monitor glucose levels. Change sensor every 14 days 09/15/18   Renato Shin, MD  cyanocobalamin (,VITAMIN B-12,) 1000 MCG/ML injection Inject 1,000 mcg into the muscle every Saturday. 12/23/19   [provider]  diphenoxylate-atropine (LOMOTIL) 2.5-0.025 MG tablet Take 1 tablet by mouth daily. 01/25/20   [provider]  donepezil (ARICEPT) 10 MG tablet Take 10 mg by mouth daily. 05/13/20   [provider]  escitalopram (LEXAPRO) 20 MG tablet Take 20 mg by mouth at bedtime. 05/12/20    [provider]  famotidine (PEPCID) 40 MG tablet Take 40 mg by mouth at bedtime.    [provider]  fluticasone (FLONASE) 50 MCG/ACT nasal spray Place 1-2 sprays into both nostrils daily as needed for allergies or rhinitis. 11/07/18   Jacelyn Pi, Lilia Argue, MD  furosemide (LASIX) 20 MG tablet Take 1 tablet (20 mg total) by mouth daily as needed for fluid or edema (feet or legs). 08/28/18   Jacelyn Pi, Lilia Argue, MD  HYDROcodone-acetaminophen (NORCO/VICODIN) 5-325 MG tablet Take 1 tablet by mouth every 6 (six) hours as needed for moderate pain or severe pain. 06/10/20   Meuth, Brooke A, PA-C  ibuprofen (ADVIL) 200 MG tablet Take 800 mg by mouth every 6 (six) hours as needed for fever or headache.    [provider]  Insulin Disposable Pump (V-GO 40) KIT 1 Device by Does not apply route daily. Patient not taking: Reported on 05/23/2020 11/14/17   Renato Shin, MD  insulin lispro (HUMALOG) 100 UNIT/ML KwikPen Inject 10-15 Units into the skin 2 (two) times daily with a meal.    [provider]  LORazepam (ATIVAN) 1 MG tablet TAKE 1 TABLET BY MOUTH TWICE DAILY AS NEEDED FOR ANXIETY Patient taking differently: No sig reported 11/07/18   Jacelyn Pi, Lilia Argue, MD  Multiple Vitamin (MULTIVITAMIN WITH MINERALS) TABS tablet Take 1 tablet by mouth daily.    [provider]  nitroGLYCERIN (NITROSTAT) 0.4 MG SL tablet Place 0.4 mg under the tongue every 5 (five) minutes as needed for  chest pain.    [provider]  pantoprazole (PROTONIX) 40 MG tablet Take 40 mg by mouth 2 (two) times daily.  08/12/17   [provider]  pioglitazone (ACTOS) 45 MG tablet Take 45 mg by mouth daily. 02/23/20   [provider]  pregabalin (LYRICA) 100 MG capsule Take 100-200 mg by mouth See admin instructions. Take 1 tablet (100 mg) by mouth every morning and 2 tablets (200 mg) at night 05/02/20   [provider]  promethazine (PHENERGAN) 25 MG tablet Take 25  mg by mouth every 6 (six) hours as needed for nausea.  08/05/17   [provider]  rizatriptan (MAXALT) 10 MG tablet Take 10 mg by mouth once as needed for migraine. Max 2 tablets/week 04/20/20   [provider]  rosuvastatin (CRESTOR) 20 MG tablet Take 20 mg by mouth at bedtime. 05/02/20   [provider]  traZODone (DESYREL) 100 MG tablet Take 1.5 tablets (150 mg total) by mouth at bedtime. Patient taking differently: Take 200 mg by mouth at bedtime. 11/07/18   Jacelyn Pi, Lilia Argue, MD  VENTOLIN HFA 108 [phone] Base) MCG/ACT inhaler INHALE 2 PUFFS BY MOUTH EVERY 6 HOURS AS NEEDED FOR WHEEZING FOR SHORTNESS OF BREATH Patient taking differently: Inhale 2 puffs into the lungs every 6 (six) hours as needed for wheezing or shortness of breath. 09/18/18   Daleen Squibb, MD      Allergies    Sulfa antibiotics, Tape, Decongestant [pseudoephedrine hcl er], Duloxetine, Fremanezumab-vfrm, Tizanidine, Chlorpheniramine, Red dye, Eggs or egg-derived products, Latex, Percocet [oxycodone-acetaminophen], Prednisone, and Zoloft [sertraline hcl]    Review of Systems   Review of Systems  Musculoskeletal:  Positive for back pain.   See HPI Physical Exam Updated Vital Signs BP (!) 112/98 (BP Location: Right Arm)   Pulse 72   Temp 97.6 F (36.4 C) (Oral)   Resp 16   Ht _0  (1.651 m)   Wt 92.1 kg   SpO2 92%   BMI 33.79 kg/m  Physical Exam Vitals and nursing note reviewed.  Constitutional:      General: She is not in acute distress.    Appearance: She is well-developed.  HENT:     Head: Normocephalic and atraumatic.  Eyes:     Conjunctiva/sclera: Conjunctivae normal.  Cardiovascular:     Rate and Rhythm: Normal rate and regular rhythm.     Heart sounds: No murmur heard. Pulmonary:     Effort: Pulmonary effort is normal. No respiratory distress.     Breath sounds: Normal breath sounds.  Abdominal:     Palpations: Abdomen is soft.     Tenderness: There is no  abdominal tenderness.  Musculoskeletal:        General: No swelling.     Cervical back: Neck supple.  Skin:    General: Skin is warm and dry.     Capillary Refill: Capillary refill takes less than 2 seconds.  Neurological:     General: No focal deficit present.     Mental Status: She is alert and oriented to person, place, and time.     Cranial Nerves: No cranial nerve deficit.     Sensory: No sensory deficit.     Motor: No weakness.     Coordination: Coordination normal.     Gait: Gait normal.     Comments: No saddle anesthesia, weakness, or reflex abnormalities.  Psychiatric:        Mood and Affect: Mood normal.  ED Results / Procedures / Treatments   Labs (all labs ordered are listed, but only abnormal results are displayed) Labs Reviewed  URINALYSIS, ROUTINE W REFLEX MICROSCOPIC - Abnormal; Notable for the following components:      Result Value   Specific Gravity, Urine <1.005 (*)    Glucose, UA 250 (*)    All other components within normal limits    EKG None  Radiology No results found.  Procedures Procedures    Medications Ordered in ED Medications  ketorolac (TORADOL) 15 MG/ML injection 15 mg (has no administration in time range)    ED Course/ Medical Decision Making/ A&P                           Medical Decision Making The patient is a 51 year old female with medical history of chronic back, prior CVA, anxiety, fibromyalgia, T2DM presenting for evaluation of right-sided lower back pain which has been worsening over the past few days.  The differential diagnosis considered includes: Pyelonephritis, nephrolithiasis, radiculopathy, MSK strain, fracture, UTI, epidural abscess, dislocation.  On initial evaluation, patient was hemodynamically stable and afebrile.  The patient's physical exam did not reveal any signs of saddle anesthesia, paresthesias, weakness, gait abnormality.  She was found to have right-sided lumbar tenderness to palpation but with  straight leg negative.  Based on the patient's history and physical exam and history of chronic back pain without any red flag findings, radiculopathy is favored as the source of her symptoms.  A urinalysis was ordered which did not show any signs of significant RBCs making nephrolithiasis less likely.  The patient also stated that her pain was constant and not colicky and she denied urinary symptoms further making nephrolithiasis less likely.  Due to the absence of red flag symptoms additional imaging was deferred at this time.  The patient was discharged a prescription for Flexeril and instructions to follow-up with her PCP for reevaluation.  She was given a dose of Toradol while in the emergency department and strict return precautions.  Amount and/or Complexity of Data Reviewed External Data Reviewed: notes. Labs: ordered. Decision-making details documented in ED Course.  Risk Prescription drug management.   Patient's presentation is most consistent with exacerbation of chronic illness.         Final Clinical Impression(s) / ED Diagnoses Final diagnoses:  Chronic right-sided low back pain without sciatica    Rx / DC Orders ED Discharge Orders          Ordered    cyclobenzaprine (FLEXERIL) 10 MG tablet  2 times daily PRN        04/30/22 1326              Dani Gobble, MD 04/30/22 1550    Sherwood Gambler, MD 05/08/22 1510

## 2022-11-13 ENCOUNTER — Other Ambulatory Visit (HOSPITAL_COMMUNITY): Payer: Self-pay | Admitting: *Deleted

## 2022-11-13 DIAGNOSIS — R1310 Dysphagia, unspecified: Secondary | ICD-10-CM

## 2022-11-13 DIAGNOSIS — R059 Cough, unspecified: Secondary | ICD-10-CM

## 2022-11-22 ENCOUNTER — Ambulatory Visit (HOSPITAL_COMMUNITY)
Admission: RE | Admit: 2022-11-22 | Discharge: 2022-11-22 | Disposition: A | Discharge status: Home / Self Care | Payer: Medicare HMO | Source: Ambulatory Visit

## 2022-11-22 ENCOUNTER — Ambulatory Visit (HOSPITAL_COMMUNITY)
Admission: RE | Admit: 2022-11-22 | Discharge: 2022-11-22 | Disposition: A | Discharge status: Home / Self Care | Payer: Medicare HMO | Source: Ambulatory Visit | Attending: Otolaryngology | Admitting: Otolaryngology

## 2022-11-22 DIAGNOSIS — T17308S Unspecified foreign body in larynx causing other injury, sequela: Secondary | ICD-10-CM | POA: Diagnosis not present

## 2022-11-22 DIAGNOSIS — R1310 Dysphagia, unspecified: Secondary | ICD-10-CM | POA: Insufficient documentation

## 2022-11-22 DIAGNOSIS — R059 Cough, unspecified: Secondary | ICD-10-CM

## 2022-11-22 DIAGNOSIS — J690 Pneumonitis due to inhalation of food and vomit: Secondary | ICD-10-CM | POA: Diagnosis present

## 2023-10-02 ENCOUNTER — Emergency Department (HOSPITAL_COMMUNITY)

## 2023-10-02 ENCOUNTER — Encounter (HOSPITAL_COMMUNITY)
Admission: EM | Disposition: A | Discharge status: Home / Self Care | Payer: Self-pay | Source: Home / Self Care | Attending: Cardiovascular Disease

## 2023-10-02 ENCOUNTER — Inpatient Hospital Stay (HOSPITAL_COMMUNITY)
Admission: EM | Admit: 2023-10-02 | Discharge: 2023-10-04 | DRG: 281 | Disposition: A | Discharge status: Home / Self Care | Attending: Cardiovascular Disease | Admitting: Cardiovascular Disease

## 2023-10-02 DIAGNOSIS — I2111 ST elevation (STEMI) myocardial infarction involving right coronary artery: Secondary | ICD-10-CM

## 2023-10-02 DIAGNOSIS — Z7984 Long term (current) use of oral hypoglycemic drugs: Secondary | ICD-10-CM | POA: Diagnosis not present

## 2023-10-02 DIAGNOSIS — E118 Type 2 diabetes mellitus with unspecified complications: Secondary | ICD-10-CM

## 2023-10-02 DIAGNOSIS — G4733 Obstructive sleep apnea (adult) (pediatric): Secondary | ICD-10-CM | POA: Diagnosis present

## 2023-10-02 DIAGNOSIS — Z635 Disruption of family by separation and divorce: Secondary | ICD-10-CM

## 2023-10-02 DIAGNOSIS — Z79899 Other long term (current) drug therapy: Secondary | ICD-10-CM | POA: Diagnosis not present

## 2023-10-02 DIAGNOSIS — Z888 Allergy status to other drugs, medicaments and biological substances status: Secondary | ICD-10-CM

## 2023-10-02 DIAGNOSIS — Z90710 Acquired absence of both cervix and uterus: Secondary | ICD-10-CM

## 2023-10-02 DIAGNOSIS — Z794 Long term (current) use of insulin: Secondary | ICD-10-CM | POA: Diagnosis not present

## 2023-10-02 DIAGNOSIS — Z91041 Radiographic dye allergy status: Secondary | ICD-10-CM | POA: Diagnosis not present

## 2023-10-02 DIAGNOSIS — K219 Gastro-esophageal reflux disease without esophagitis: Secondary | ICD-10-CM | POA: Diagnosis present

## 2023-10-02 DIAGNOSIS — Z9049 Acquired absence of other specified parts of digestive tract: Secondary | ICD-10-CM

## 2023-10-02 DIAGNOSIS — F17210 Nicotine dependence, cigarettes, uncomplicated: Secondary | ICD-10-CM | POA: Diagnosis not present

## 2023-10-02 DIAGNOSIS — I2119 ST elevation (STEMI) myocardial infarction involving other coronary artery of inferior wall: Secondary | ICD-10-CM | POA: Diagnosis present

## 2023-10-02 DIAGNOSIS — Z91012 Allergy to eggs: Secondary | ICD-10-CM | POA: Diagnosis not present

## 2023-10-02 DIAGNOSIS — I10 Essential (primary) hypertension: Secondary | ICD-10-CM | POA: Diagnosis present

## 2023-10-02 DIAGNOSIS — R0789 Other chest pain: Secondary | ICD-10-CM | POA: Diagnosis present

## 2023-10-02 DIAGNOSIS — I748 Embolism and thrombosis of other arteries: Secondary | ICD-10-CM | POA: Diagnosis not present

## 2023-10-02 DIAGNOSIS — Z8673 Personal history of transient ischemic attack (TIA), and cerebral infarction without residual deficits: Secondary | ICD-10-CM

## 2023-10-02 DIAGNOSIS — Z803 Family history of malignant neoplasm of breast: Secondary | ICD-10-CM

## 2023-10-02 DIAGNOSIS — I252 Old myocardial infarction: Secondary | ICD-10-CM

## 2023-10-02 DIAGNOSIS — I2510 Atherosclerotic heart disease of native coronary artery without angina pectoris: Secondary | ICD-10-CM

## 2023-10-02 DIAGNOSIS — Q2112 Patent foramen ovale: Secondary | ICD-10-CM | POA: Diagnosis not present

## 2023-10-02 DIAGNOSIS — Z833 Family history of diabetes mellitus: Secondary | ICD-10-CM

## 2023-10-02 DIAGNOSIS — Z881 Allergy status to other antibiotic agents status: Secondary | ICD-10-CM

## 2023-10-02 DIAGNOSIS — I213 ST elevation (STEMI) myocardial infarction of unspecified site: Principal | ICD-10-CM

## 2023-10-02 DIAGNOSIS — Z5986 Financial insecurity: Secondary | ICD-10-CM

## 2023-10-02 DIAGNOSIS — Z7901 Long term (current) use of anticoagulants: Secondary | ICD-10-CM

## 2023-10-02 DIAGNOSIS — Z91048 Other nonmedicinal substance allergy status: Secondary | ICD-10-CM | POA: Diagnosis not present

## 2023-10-02 DIAGNOSIS — Z885 Allergy status to narcotic agent status: Secondary | ICD-10-CM

## 2023-10-02 DIAGNOSIS — Z882 Allergy status to sulfonamides status: Secondary | ICD-10-CM | POA: Diagnosis not present

## 2023-10-02 DIAGNOSIS — F419 Anxiety disorder, unspecified: Secondary | ICD-10-CM | POA: Diagnosis present

## 2023-10-02 DIAGNOSIS — Z7902 Long term (current) use of antithrombotics/antiplatelets: Secondary | ICD-10-CM | POA: Diagnosis not present

## 2023-10-02 DIAGNOSIS — E785 Hyperlipidemia, unspecified: Secondary | ICD-10-CM | POA: Diagnosis present

## 2023-10-02 DIAGNOSIS — Z5941 Food insecurity: Secondary | ICD-10-CM

## 2023-10-02 DIAGNOSIS — I219 Acute myocardial infarction, unspecified: Secondary | ICD-10-CM

## 2023-10-02 DIAGNOSIS — E1159 Type 2 diabetes mellitus with other circulatory complications: Secondary | ICD-10-CM | POA: Diagnosis not present

## 2023-10-02 DIAGNOSIS — E119 Type 2 diabetes mellitus without complications: Secondary | ICD-10-CM | POA: Diagnosis present

## 2023-10-02 DIAGNOSIS — Z91040 Latex allergy status: Secondary | ICD-10-CM

## 2023-10-02 HISTORY — PX: LEFT HEART CATH AND CORONARY ANGIOGRAPHY: CATH118249

## 2023-10-02 HISTORY — PX: CORONARY/GRAFT ACUTE MI REVASCULARIZATION: CATH118305

## 2023-10-02 LAB — CBC
HCT: 43.3 % (ref 36.0–46.0)
HCT: 41.4 % (ref 36.0–46.0)
Hemoglobin: 13.7 g/dL (ref 12.0–15.0)
Hemoglobin: 13.5 g/dL (ref 12.0–15.0)
MCH: 28.8 pg (ref 26.0–34.0)
MCH: 29.6 pg (ref 26.0–34.0)
MCHC: 31.6 g/dL (ref 30.0–36.0)
MCHC: 32.6 g/dL (ref 30.0–36.0)
MCV: 91.2 fL (ref 80.0–100.0)
MCV: 90.8 fL (ref 80.0–100.0)
Platelets: 353 10*3/uL (ref 150–400)
Platelets: 326 10*3/uL (ref 150–400)
RBC: 4.75 MIL/uL (ref 3.87–5.11)
RBC: 4.56 MIL/uL (ref 3.87–5.11)
RDW: 16.9 % — ABNORMAL HIGH (ref 11.5–15.5)
RDW: 17 % — ABNORMAL HIGH (ref 11.5–15.5)
WBC: 15.5 10*3/uL — ABNORMAL HIGH (ref 4.0–10.5)
WBC: 15.8 10*3/uL — ABNORMAL HIGH (ref 4.0–10.5)
nRBC: 0.5 % — ABNORMAL HIGH (ref 0.0–0.2)
nRBC: 0.3 % — ABNORMAL HIGH (ref 0.0–0.2)

## 2023-10-02 LAB — HEMOGLOBIN A1C
Hgb A1c MFr Bld: 6.6 % — ABNORMAL HIGH (ref 4.8–5.6)
Mean Plasma Glucose: 142.72 mg/dL

## 2023-10-02 LAB — CG4 I-STAT (LACTIC ACID): Lactic Acid, Venous: 0.7 mmol/L (ref 0.5–1.9)

## 2023-10-02 LAB — TROPONIN I (HIGH SENSITIVITY): Troponin I (High Sensitivity): >24000 ng/L (ref <18)

## 2023-10-02 LAB — BASIC METABOLIC PANEL WITH GFR
Anion gap: 11 (ref 5–15)
BUN: 16 mg/dL (ref 6–20)
CO2: 20 mmol/L — ABNORMAL LOW (ref 22–32)
Calcium: 8.5 mg/dL — ABNORMAL LOW (ref 8.9–10.3)
Chloride: 105 mmol/L (ref 98–111)
Creatinine, Ser: 1.23 mg/dL — ABNORMAL HIGH (ref 0.44–1.00)
GFR, Estimated: 53 mL/min — ABNORMAL LOW (ref >60)
Glucose, Bld: 110 mg/dL — ABNORMAL HIGH (ref 70–99)
Potassium: 4.5 mmol/L (ref 3.5–5.1)
Sodium: 136 mmol/L (ref 135–145)

## 2023-10-02 LAB — CREATININE, SERUM
Creatinine, Ser: 1.24 mg/dL — ABNORMAL HIGH (ref 0.44–1.00)
GFR, Estimated: 52 mL/min — ABNORMAL LOW (ref >60)

## 2023-10-02 LAB — MRSA NEXT GEN BY PCR, NASAL: MRSA by PCR Next Gen: NOT DETECTED

## 2023-10-02 LAB — GLUCOSE, CAPILLARY: Glucose-Capillary: 135 mg/dL — ABNORMAL HIGH (ref 70–99)

## 2023-10-02 LAB — POCT ACTIVATED CLOTTING TIME: Activated Clotting Time: 228 s

## 2023-10-02 SURGERY — CORONARY/GRAFT ACUTE MI REVASCULARIZATION
Anesthesia: LOCAL

## 2023-10-02 MED ORDER — HEPARIN SODIUM (PORCINE) 1000 UNIT/ML IJ SOLN
INTRAMUSCULAR | Status: DC | PRN
  Administered 2023-10-02: 4000 [IU] via INTRAVENOUS

## 2023-10-02 MED ORDER — TIROFIBAN HCL IV 12.5 MG/250 ML
0.1500 ug/kg/min | INTRAVENOUS | Status: AC
  Administered 2023-10-03: 0.075 ug/kg/min via INTRAVENOUS
  Filled 2023-10-02 (×2): qty 250

## 2023-10-02 MED ORDER — SODIUM CHLORIDE 0.9% FLUSH
3.0000 mL | Freq: Two times a day (BID) | INTRAVENOUS | Status: DC
  Administered 2023-10-03 – 2023-10-04 (×4): 3 mL via INTRAVENOUS

## 2023-10-02 MED ORDER — ASPIRIN 81 MG PO CHEW: 324.0000 mg | CHEWABLE_TABLET | ORAL | Status: DC

## 2023-10-02 MED ORDER — TIROFIBAN (AGGRASTAT) BOLUS VIA INFUSION
INTRAVENOUS | Status: DC | PRN
  Administered 2023-10-02: 2040 ug via INTRAVENOUS

## 2023-10-02 MED ORDER — INSULIN ASPART 100 UNIT/ML IJ SOLN: 0.0000 [IU] | Freq: Every day | INTRAMUSCULAR | Status: DC

## 2023-10-02 MED ORDER — INSULIN ASPART 100 UNIT/ML IJ SOLN
0.0000 [IU] | Freq: Three times a day (TID) | INTRAMUSCULAR | Status: DC
  Administered 2023-10-03: 3 [IU] via SUBCUTANEOUS

## 2023-10-02 MED ORDER — HEPARIN (PORCINE) IN NACL 1000-0.9 UT/500ML-% IV SOLN
INTRAVENOUS | Status: DC | PRN
  Administered 2023-10-02: 500 mL

## 2023-10-02 MED ORDER — MIDAZOLAM HCL 2 MG/2ML IJ SOLN
INTRAMUSCULAR | Status: DC | PRN
  Administered 2023-10-02: 1 mg via INTRAVENOUS

## 2023-10-02 MED ORDER — SODIUM CHLORIDE 0.9% FLUSH: 3.0000 mL | INTRAVENOUS | Status: DC | PRN

## 2023-10-02 MED ORDER — ROSUVASTATIN CALCIUM 20 MG PO TABS
40.0000 mg | ORAL_TABLET | Freq: Every day | ORAL | Status: DC
Start: 2023-10-02 — End: 2023-10-04
  Administered 2023-10-02 – 2023-10-03 (×2): 40 mg via ORAL
  Filled 2023-10-02 (×2): qty 2

## 2023-10-02 MED ORDER — TIROFIBAN HCL IN NACL 5-0.9 MG/100ML-% IV SOLN
INTRAVENOUS | Status: AC
  Filled 2023-10-02: qty 100

## 2023-10-02 MED ORDER — ASPIRIN 81 MG PO TBEC
81.0000 mg | DELAYED_RELEASE_TABLET | Freq: Every day | ORAL | Status: DC
  Administered 2023-10-03: 81 mg via ORAL
  Filled 2023-10-02: qty 1

## 2023-10-02 MED ORDER — DIAZEPAM 5 MG PO TABS
5.0000 mg | ORAL_TABLET | Freq: Three times a day (TID) | ORAL | Status: DC | PRN
  Administered 2023-10-03 (×2): 5 mg via ORAL
  Filled 2023-10-02 (×2): qty 1

## 2023-10-02 MED ORDER — FENTANYL CITRATE (PF) 100 MCG/2ML IJ SOLN
INTRAMUSCULAR | Status: DC | PRN
  Administered 2023-10-02: 25 ug via INTRAVENOUS

## 2023-10-02 MED ORDER — HEPARIN SODIUM (PORCINE) 5000 UNIT/ML IJ SOLN
4000.0000 [IU] | Freq: Once | INTRAMUSCULAR | Status: AC
  Administered 2023-10-02: 4000 [IU] via INTRAVENOUS
  Filled 2023-10-02: qty 1

## 2023-10-02 MED ORDER — IOHEXOL 350 MG/ML SOLN
INTRAVENOUS | Status: DC | PRN
  Administered 2023-10-02: 65 mL

## 2023-10-02 MED ORDER — TICAGRELOR 90 MG PO TABS
ORAL_TABLET | ORAL | Status: DC | PRN
  Administered 2023-10-02: 180 mg via ORAL

## 2023-10-02 MED ORDER — HEPARIN SODIUM (PORCINE) 1000 UNIT/ML IJ SOLN
INTRAMUSCULAR | Status: AC
Start: 2023-10-02 — End: ?
  Filled 2023-10-02: qty 10

## 2023-10-02 MED ORDER — VERAPAMIL HCL 2.5 MG/ML IV SOLN
INTRAVENOUS | Status: AC
  Filled 2023-10-02: qty 2

## 2023-10-02 MED ORDER — LIDOCAINE HCL (PF) 1 % IJ SOLN
INTRAMUSCULAR | Status: DC | PRN
  Administered 2023-10-02: 2 mL

## 2023-10-02 MED ORDER — SODIUM CHLORIDE 0.9 % IV SOLN: 250.0000 mL | INTRAVENOUS | Status: AC | PRN

## 2023-10-02 MED ORDER — ONDANSETRON HCL 4 MG/2ML IJ SOLN: 4.0000 mg | Freq: Once | INTRAMUSCULAR | Status: DC

## 2023-10-02 MED ORDER — PANTOPRAZOLE SODIUM 40 MG PO TBEC
40.0000 mg | DELAYED_RELEASE_TABLET | Freq: Two times a day (BID) | ORAL | Status: DC
  Administered 2023-10-02 – 2023-10-04 (×4): 40 mg via ORAL
  Filled 2023-10-02 (×4): qty 1

## 2023-10-02 MED ORDER — ASPIRIN 300 MG RE SUPP: 300.0000 mg | RECTAL | Status: DC

## 2023-10-02 MED ORDER — ACETAMINOPHEN 325 MG PO TABS: 650.0000 mg | ORAL_TABLET | ORAL | Status: DC | PRN

## 2023-10-02 MED ORDER — HYDRALAZINE HCL 20 MG/ML IJ SOLN: 10.0000 mg | INTRAMUSCULAR | Status: AC | PRN

## 2023-10-02 MED ORDER — TIROFIBAN HCL IN NACL 5-0.9 MG/100ML-% IV SOLN
INTRAVENOUS | Status: AC | PRN
  Administered 2023-10-02: .15 ug/kg/min via INTRAVENOUS

## 2023-10-02 MED ORDER — NITROGLYCERIN 1 MG/10 ML FOR IR/CATH LAB
INTRA_ARTERIAL | Status: AC
Start: 2023-10-02 — End: 2023-10-03
  Filled 2023-10-02: qty 10

## 2023-10-02 MED ORDER — MIDAZOLAM HCL 2 MG/2ML IJ SOLN
INTRAMUSCULAR | Status: AC
  Filled 2023-10-02: qty 2

## 2023-10-02 MED ORDER — VERAPAMIL HCL 2.5 MG/ML IV SOLN
INTRAVENOUS | Status: DC | PRN
  Administered 2023-10-02: 10 mL via INTRA_ARTERIAL

## 2023-10-02 MED ORDER — ENOXAPARIN SODIUM 40 MG/0.4ML IJ SOSY: 40.0000 mg | PREFILLED_SYRINGE | INTRAMUSCULAR | Status: DC

## 2023-10-02 MED ORDER — ONDANSETRON HCL 4 MG/2ML IJ SOLN: 4.0000 mg | Freq: Four times a day (QID) | INTRAMUSCULAR | Status: DC | PRN

## 2023-10-02 MED ORDER — LIDOCAINE HCL (PF) 1 % IJ SOLN
INTRAMUSCULAR | Status: AC
  Filled 2023-10-02: qty 30

## 2023-10-02 MED ORDER — FENTANYL CITRATE (PF) 100 MCG/2ML IJ SOLN
INTRAMUSCULAR | Status: AC
  Filled 2023-10-02: qty 2

## 2023-10-02 MED ORDER — TICAGRELOR 90 MG PO TABS
90.0000 mg | ORAL_TABLET | Freq: Two times a day (BID) | ORAL | Status: DC
  Administered 2023-10-03: 90 mg via ORAL
  Filled 2023-10-02: qty 1

## 2023-10-02 MED ORDER — TICAGRELOR 90 MG PO TABS
ORAL_TABLET | ORAL | Status: AC
Start: 2023-10-02 — End: ?
  Filled 2023-10-02: qty 2

## 2023-10-02 MED ORDER — LABETALOL HCL 5 MG/ML IV SOLN: 10.0000 mg | INTRAVENOUS | Status: AC | PRN

## 2023-10-02 MED ORDER — SODIUM CHLORIDE 0.9 % IV SOLN: 12.5000 mg | Freq: Four times a day (QID) | INTRAVENOUS | Status: DC | PRN

## 2023-10-02 SURGICAL SUPPLY — 8 items
CATH 5FR JL3.5 JR4 ANG PIG MP (CATHETERS) IMPLANT
DEVICE RAD COMP TR BAND LRG (VASCULAR PRODUCTS) IMPLANT
GLIDESHEATH SLEND SS 6F .021 (SHEATH) IMPLANT
GUIDEWIRE INQWIRE 1.5J.035X260 (WIRE) IMPLANT
KIT ENCORE 26 ADVANTAGE (KITS) IMPLANT
PACK CARDIAC CATHETERIZATION (CUSTOM PROCEDURE TRAY) ×2 IMPLANT
SET ATX-X65L (MISCELLANEOUS) IMPLANT
TUBING CIL FLEX 10 FLL-RA (TUBING) IMPLANT

## 2023-10-02 NOTE — ED Triage Notes (Addendum)
 Patient sent by UC for elevated trop. Patient went to [facility] for CP that felt like acid in her chest this morning at 5am, since resolved but still has 3/10 chest pressure. Patient is A&Ox4, no cardiac history that she is aware of. UC gave 324mg  aspirin  around 1430. Last PO intake 1700.

## 2023-10-02 NOTE — Discharge Instructions (Signed)

## 2023-10-02 NOTE — ED Provider Notes (Signed)
 [facility] Provider Note   CSN: [id number] Arrival date & time: 10/02/23  1910     History  Chief Complaint  Patient presents with   Chest Pain   Emesis    Kristie Tapia is a 53 y.o. female history of hypertension, here presenting with chest pain.  Patient woke up this morning and had some indigestion feeling.  Patient went to urgent care and was given GI cocktail and had an outpatient troponin that is elevated to 40,000.  Results just came back so patient was called and was sent immediately to the ER.  Patient states that she was given aspirin  at urgent care.  Patient still has 2 out of 10 chest pain  The history is provided by the patient.  Emesis      Home Medications Prior to Admission medications   Medication Sig Start Date End Date Taking? Authorizing Provider  acetaminophen  (TYLENOL ) 500 MG tablet Take 1,000 mg by mouth every 6 (six) hours as needed for fever (for headaches).    [provider]  clopidogrel  (PLAVIX ) 75 MG tablet Take 1 tablet by mouth once daily Patient taking differently: Take 75 mg by mouth daily. 05/28/19   Orvel Blanco, MD  Continuous Blood Gluc Sensor (FREESTYLE LIBRE 14 DAY SENSOR) MISC 1 each by Other route every 14 (fourteen) days. Use to monitor glucose levels. Change sensor every 14 days 09/15/18   Gwyndolyn Lerner, MD  cyanocobalamin (,VITAMIN B-12,) 1000 MCG/ML injection Inject 1,000 mcg into the muscle every Saturday. 12/23/19   [provider]  cyclobenzaprine  (FLEXERIL ) 10 MG tablet Take 1 tablet (10 mg total) by mouth 2 (two) times daily as needed for muscle spasms. 04/30/22   Jodeen Munch, MD  diphenoxylate-atropine (LOMOTIL) 2.5-0.025 MG tablet Take 1 tablet by mouth daily. 01/25/20   [provider]  donepezil  (ARICEPT ) 10 MG tablet Take 10 mg by mouth daily. 05/13/20   [provider]  escitalopram  (LEXAPRO ) 20 MG tablet Take 20 mg by mouth at bedtime.  05/12/20   [provider]  famotidine  (PEPCID ) 40 MG tablet Take 40 mg by mouth at bedtime.    [provider]  fluticasone  (FLONASE ) 50 MCG/ACT nasal spray Place 1-2 sprays into both nostrils daily as needed for allergies or rhinitis. 11/07/18   Elyce Hams, Marguerita Shih, MD  furosemide  (LASIX ) 20 MG tablet Take 1 tablet (20 mg total) by mouth daily as needed for fluid or edema (feet or legs). 08/28/18   Elyce Hams, Marguerita Shih, MD  HYDROcodone -acetaminophen  (NORCO/VICODIN) 5-325 MG tablet Take 1 tablet by mouth every 6 (six) hours as needed for moderate pain or severe pain. 06/10/20   Meuth, Brooke A, PA-C  ibuprofen  (ADVIL ) 200 MG tablet Take 800 mg by mouth every 6 (six) hours as needed for fever or headache.    [provider]  Insulin  Disposable Pump (V-GO 40) KIT 1 Device by Does not apply route daily. Patient not taking: Reported on 05/23/2020 11/14/17   Gwyndolyn Lerner, MD  insulin  lispro (HUMALOG ) 100 UNIT/ML KwikPen Inject 10-15 Units into the skin 2 (two) times daily with a meal.    [provider]  LORazepam  (ATIVAN ) 1 MG tablet TAKE 1 TABLET BY MOUTH TWICE DAILY AS NEEDED FOR ANXIETY Patient taking differently: No sig reported 11/07/18   Elyce Hams, Marguerita Shih, MD  Multiple Vitamin (MULTIVITAMIN WITH MINERALS) TABS tablet Take 1 tablet by mouth daily.    [provider]  nitroGLYCERIN  (  NITROSTAT ) 0.4 MG SL tablet Place 0.4 mg under the tongue every 5 (five) minutes as needed for chest pain.    [provider]  pantoprazole  (PROTONIX ) 40 MG tablet Take 40 mg by mouth 2 (two) times daily.  08/12/17   [provider]  pioglitazone  (ACTOS ) 45 MG tablet Take 45 mg by mouth daily. 02/23/20   [provider]  pregabalin  (LYRICA ) 100 MG capsule Take 100-200 mg by mouth See admin instructions. Take 1 tablet (100 mg) by mouth every morning and 2 tablets (200 mg) at night 05/02/20   [provider]  promethazine  (PHENERGAN ) 25 MG tablet  Take 25 mg by mouth every 6 (six) hours as needed for nausea.  08/05/17   [provider]  rizatriptan (MAXALT) 10 MG tablet Take 10 mg by mouth once as needed for migraine. Max 2 tablets/week 04/20/20   [provider]  rosuvastatin  (CRESTOR ) 20 MG tablet Take 20 mg by mouth at bedtime. 05/02/20   [provider]  traZODone  (DESYREL ) 100 MG tablet Take 1.5 tablets (150 mg total) by mouth at bedtime. Patient taking differently: Take 200 mg by mouth at bedtime. 11/07/18   Orvel Blanco, MD  VENTOLIN  HFA 108 [phone] Base) MCG/ACT inhaler INHALE 2 PUFFS BY MOUTH EVERY 6 HOURS AS NEEDED FOR WHEEZING FOR SHORTNESS OF BREATH Patient taking differently: Inhale 2 puffs into the lungs every 6 (six) hours as needed for wheezing or shortness of breath. 09/18/18   Orvel Blanco, MD      Allergies    Sulfa antibiotics, Tape, Decongestant [pseudoephedrine hcl er], Duloxetine , Fremanezumab-vfrm, Tizanidine , Chlorpheniramine, Red dye #40 (allura red), Egg-derived products, Latex, Percocet [oxycodone -acetaminophen ], Prednisone, and Zoloft [sertraline hcl]    Review of Systems   Review of Systems  Cardiovascular:  Positive for chest pain.  Gastrointestinal:  Positive for vomiting.  All other systems reviewed and are negative.   Physical Exam Updated Vital Signs BP 108/62   Pulse 67   Temp 98.1 F (36.7 C)   Resp 17   Ht 5\' 5"  (1.651 m)   Wt 81.6 kg   SpO2 100%   BMI 29.94 kg/m  Physical Exam Vitals and nursing note reviewed.  Constitutional:      Appearance: She is well-developed.  HENT:     Head: Normocephalic.  Eyes:     Extraocular Movements: Extraocular movements intact.     Pupils: Pupils are equal, round, and reactive to light.  Cardiovascular:     Rate and Rhythm: Normal rate and regular rhythm.     Heart sounds: Normal heart sounds.  Pulmonary:     Effort: Pulmonary effort is normal.     Breath sounds: Normal breath sounds.  Abdominal:      General: Bowel sounds are normal.     Palpations: Abdomen is soft.  Musculoskeletal:        General: Normal range of motion.     Cervical back: Normal range of motion and neck supple.  Skin:    General: Skin is warm.     Capillary Refill: Capillary refill takes less than 2 seconds.  Neurological:     General: No focal deficit present.     Mental Status: She is alert and oriented to person, place, and time.  Psychiatric:        Mood and Affect: Mood normal.        Behavior: Behavior normal.     ED Results / Procedures / Treatments   Labs (all  labs ordered are listed, but only abnormal results are displayed) Labs Reviewed  BASIC METABOLIC PANEL WITH GFR  CBC  TROPONIN I (HIGH SENSITIVITY)    EKG EKG Interpretation Date/Time:  Wednesday October 02 2023 20:04:48 EDT Ventricular Rate:  62 PR Interval:  188 QRS Duration:  76 QT Interval:  426 QTC Calculation: 432 R Axis:   47  Text Interpretation: Normal sinus rhythm Low voltage QRS STEMI leads III, aVF Abnormal ECG When compared with ECG of 02-Oct-2023 19:56, no significant change since earlier in the day Confirmed by Florette Hurry [phone]) on 10/02/2023 8:16:00 PM  Radiology No results found.  Procedures Procedures    CRITICAL CARE Performed by: Florette Hurry   Total critical care time: 35 minutes  Critical care time was exclusive of separately billable procedures and treating other patients.  Critical care was necessary to treat or prevent imminent or life-threatening deterioration.  Critical care was time spent personally by me on the following activities: development of treatment plan with patient and/or surrogate as well as nursing, discussions with consultants, evaluation of patient's response to treatment, examination of patient, obtaining history from patient or surrogate, ordering and performing treatments and interventions, ordering and review of laboratory studies, ordering and review of radiographic studies,  pulse oximetry and re-evaluation of patient's condition.   Medications Ordered in ED Medications  promethazine  (PHENERGAN ) 12.5 mg in sodium chloride  0.9 % 50 mL IVPB (has no administration in time range)  heparin  injection 4,000 Units (4,000 Units Intravenous Given 10/02/23 2020)    ED Course/ Medical Decision Making/ A&P                                 Medical Decision Making Deveny Pegues is a 53 y.o. female here presenting with chest pain.  Patient had an outpatient troponin of 40,000.  Initial EKG showed small elevation of 2 3 aVF.  Repeat EKG showed still minimal elevation.  Patient still has pain so code STEMI was activated.  Discussed with Dr. Arlester Ladd.  Patient was given heparin  bolus in the ED.  Patient already received aspirin  earlier today.  Patient will go emergently to Cath Lab   Problems Addressed: ST elevation myocardial infarction (STEMI), unspecified artery [facility]): acute illness or injury  Amount and/or Complexity of Data Reviewed Labs: ordered. Decision-making details documented in ED Course. Radiology: ordered and independent interpretation performed. Decision-making details documented in ED Course. ECG/medicine tests: ordered and independent interpretation performed. Decision-making details documented in ED Course.  Risk Prescription drug management. Decision regarding hospitalization.    Final Clinical Impression(s) / ED Diagnoses Final diagnoses:  ST elevation myocardial infarction (STEMI), unspecified artery [facility])    Rx / DC Orders ED Discharge Orders     None         Dalene Duck, MD 10/02/23 2032

## 2023-10-02 NOTE — H&P (Signed)
 Cardiology Admission History and Physical   Patient ID: Kristie Tapia MRN: [id number]; DOB: 02/13/1971   Admission date: 10/02/2023  PCP:  System, Provider Not In   [facility] Providers Cardiologist:  None        Chief Complaint:  chest pain  Patient Profile:   Kristie Tapia is a 53 y.o. female with T2DM, OSA who is being seen 10/02/2023 for the evaluation of STEMI and chest pain.  History of Present Illness:   Kristie Tapia is a 53 year old female with history of type 2 diabetes for 2 decades, OSA, GERD, active tobacco abuse who started having chest pain earlier in the day today on 10/02/2023.  Noted that she woke up feeling like she had severe reflux and chest pressure.  Had nausea and an episode of vomiting.  The symptoms persisted throughout the day and gradually worsened.  Thus her husband took her for evaluation urgent care.  The urgent care ECG was unremarkable and labs were drawn which included a troponin value.  First troponin value was 40,000.  The providers at the urgent care directed her to come to the emergency department at [facility].  The Emergency Department.  She is hemodynamically stable.  Repeat ECG showed inferior ST elevation.  She was still having chest pain and pressure.  Thus code STEMI was activated.  She received aspirin  load and 1 bolus of heparin .   Past Medical History:  Diagnosis Date   Anemia    Anxiety    Complication of anesthesia ` 1998   "lungs collapsed during LEEP procedure"   Gallstones    GERD (gastroesophageal reflux disease)    H/O hiatal hernia    History of stomach ulcers    Migraines    Sleep apnea    Stroke (HCC)    Type II diabetes mellitus (HCC)     Past Surgical History:  Procedure Laterality Date   ABDOMINAL EXPLORATION SURGERY     "I've had several"   ABDOMINAL HYSTERECTOMY  08/1998   APPENDECTOMY  01/03/2012   DILATION AND CURETTAGE OF UTERUS     INCISION AND DRAINAGE ABSCESS N/A 05/25/2020    Procedure: INCISION AND DRAINAGE BUTTOCK ABSCESS;  Surgeon: Shela Derby, MD;  Location: [facility] OR;  Service: General;  Laterality: N/A;   INCISION AND DRAINAGE PERIRECTAL ABSCESS N/A 05/23/2020   Procedure: IRRIGATION AND DEBRIDEMENT BUTTOCKS ABSCESS and retroperitoneum (N/A);  Surgeon: Shela Derby, MD;  Location: [facility] OR;  Service: General;  Laterality: N/A;   LAPAROSCOPIC APPENDECTOMY  01/03/2012   Procedure: APPENDECTOMY LAPAROSCOPIC;  Surgeon: Quitman Bucy, MD;  Location: [facility] OR;  Service: General;  Laterality: N/A;   LEEP  1998   "lungs collapsed"   TUBAL LIGATION  05/1998     Medications Prior to Admission: Prior to Admission medications   Medication Sig Start Date End Date Taking? Authorizing Provider  acetaminophen  (TYLENOL ) 500 MG tablet Take 1,000 mg by mouth every 6 (six) hours as needed for fever (for headaches).    [provider]  clopidogrel  (PLAVIX ) 75 MG tablet Take 1 tablet by mouth once daily Patient taking differently: Take 75 mg by mouth daily. 05/28/19   Orvel Blanco, MD  Continuous Blood Gluc Sensor (FREESTYLE LIBRE 14 DAY SENSOR) MISC 1 each by Other route every 14 (fourteen) days. Use to monitor glucose levels. Change sensor every 14 days 09/15/18   Gwyndolyn Lerner, MD  cyanocobalamin (,VITAMIN B-12,) 1000 MCG/ML injection Inject 1,000 mcg into the muscle every Saturday. 12/23/19  [provider]  cyclobenzaprine  (FLEXERIL ) 10 MG tablet Take 1 tablet (10 mg total) by mouth 2 (two) times daily as needed for muscle spasms. 04/30/22   Jodeen Munch, MD  diphenoxylate-atropine (LOMOTIL) 2.5-0.025 MG tablet Take 1 tablet by mouth daily. 01/25/20   [provider]  donepezil  (ARICEPT ) 10 MG tablet Take 10 mg by mouth daily. 05/13/20   [provider]  escitalopram  (LEXAPRO ) 20 MG tablet Take 20 mg by mouth at bedtime. 05/12/20   [provider]  famotidine  (PEPCID ) 40 MG tablet Take 40 mg by mouth at bedtime.     [provider]  fluticasone  (FLONASE ) 50 MCG/ACT nasal spray Place 1-2 sprays into both nostrils daily as needed for allergies or rhinitis. 11/07/18   Elyce Hams, Marguerita Shih, MD  furosemide  (LASIX ) 20 MG tablet Take 1 tablet (20 mg total) by mouth daily as needed for fluid or edema (feet or legs). 08/28/18   Elyce Hams, Marguerita Shih, MD  HYDROcodone -acetaminophen  (NORCO/VICODIN) 5-325 MG tablet Take 1 tablet by mouth every 6 (six) hours as needed for moderate pain or severe pain. 06/10/20   Meuth, Brooke A, PA-C  ibuprofen  (ADVIL ) 200 MG tablet Take 800 mg by mouth every 6 (six) hours as needed for fever or headache.    [provider]  Insulin  Disposable Pump (V-GO 40) KIT 1 Device by Does not apply route daily. Patient not taking: Reported on 05/23/2020 11/14/17   Gwyndolyn Lerner, MD  insulin  lispro (HUMALOG ) 100 UNIT/ML KwikPen Inject 10-15 Units into the skin 2 (two) times daily with a meal.    [provider]  LORazepam  (ATIVAN ) 1 MG tablet TAKE 1 TABLET BY MOUTH TWICE DAILY AS NEEDED FOR ANXIETY Patient taking differently: No sig reported 11/07/18   Elyce Hams, Marguerita Shih, MD  Multiple Vitamin (MULTIVITAMIN WITH MINERALS) TABS tablet Take 1 tablet by mouth daily.    [provider]  nitroGLYCERIN  (NITROSTAT ) 0.4 MG SL tablet Place 0.4 mg under the tongue every 5 (five) minutes as needed for chest pain.    [provider]  pantoprazole  (PROTONIX ) 40 MG tablet Take 40 mg by mouth 2 (two) times daily.  08/12/17   [provider]  pioglitazone  (ACTOS ) 45 MG tablet Take 45 mg by mouth daily. 02/23/20   [provider]  pregabalin  (LYRICA ) 100 MG capsule Take 100-200 mg by mouth See admin instructions. Take 1 tablet (100 mg) by mouth every morning and 2 tablets (200 mg) at night 05/02/20   [provider]  promethazine  (PHENERGAN ) 25 MG tablet Take 25 mg by mouth every 6 (six) hours as needed for nausea.  08/05/17   [provider]   rizatriptan (MAXALT) 10 MG tablet Take 10 mg by mouth once as needed for migraine. Max 2 tablets/week 04/20/20   [provider]  rosuvastatin  (CRESTOR ) 20 MG tablet Take 20 mg by mouth at bedtime. 05/02/20   [provider]  traZODone  (DESYREL ) 100 MG tablet Take 1.5 tablets (150 mg total) by mouth at bedtime. Patient taking differently: Take 200 mg by mouth at bedtime. 11/07/18   Elyce Hams, Marguerita Shih, MD  VENTOLIN  HFA 108 (90 Base) MCG/ACT inhaler INHALE 2 PUFFS BY MOUTH EVERY 6 HOURS AS NEEDED FOR WHEEZING FOR SHORTNESS OF BREATH Patient taking differently: Inhale 2 puffs into the lungs every 6 (six) hours as needed for wheezing or shortness of breath. 09/18/18   Orvel Blanco, MD     Allergies:    Allergies  Allergen Reactions   Sulfa Antibiotics Other (See Comments)    Burns the inside of my mouth; "blisters; leaves tongue and inside of mouth solid red"   Tape Other (See Comments)    Blisters- USE PAPER TAPE ONLY   Decongestant [Pseudoephedrine Hcl Er] Itching    ALL DECONGESTANTS   Duloxetine  Other (See Comments)    Felt "out of it"   Fremanezumab-Vfrm Other (See Comments) and Nausea Only   Tizanidine  Other (See Comments)    headache   Chlorpheniramine Itching   Red Dye #40 (Allura Red) Other (See Comments)    CAUSES MIGRAINES   Egg-Derived Products Nausea And Vomiting   Latex Itching   Percocet [Oxycodone -Acetaminophen ] Other (See Comments)    PT reports having nightmares when taking Percocet   Prednisone Other (See Comments)    "Gives me a bad attitude"   Zoloft [Sertraline Hcl] Rash    Social History:   Social History   Socioeconomic History   Marital status: Legally Separated    Spouse name: Not on file   Number of children: 3   Years of education: Not on file   Highest education level: Not on file  Occupational History   Not on file  Tobacco Use   Smoking status: Every Day    Current packs/day: 0.00    Average packs/day: 1 pack/day  for 29.0 years (29.0 ttl pk-yrs)    Types: Cigarettes    Start date: 03/05/1988    Last attempt to quit: 03/05/2017    Years since quitting: 6.5   Smokeless tobacco: Never   Tobacco comments:    has smoked since age 76  Vaping Use   Vaping status: Never Used  Substance and Sexual Activity   Alcohol use: No    Comment: hasn't  had s drink in over 5 yrs   Drug use: No   Sexual activity: Yes  Other Topics Concern   Not on file  Social History Narrative   Not on file   Social Drivers of Health   Financial Resource Strain: Medium Risk (09/27/2023)   Received from [facility]   Overall Financial Resource Strain (CARDIA)    Difficulty of Paying Living Expenses: Somewhat hard  Food Insecurity: Food Insecurity Present (09/27/2023)   Received from [facility]   Hunger Vital Sign    Worried About Running Out of Food in the Last Year: Sometimes true    Ran Out of Food in the Last Year: Sometimes true  Transportation Needs: No Transportation Needs (09/27/2023)   Received from [facility] - Transportation    Lack of Transportation (Medical): No    Lack of Transportation (Non-Medical): No  Physical Activity: Inactive (09/27/2023)   Received from [facility]   Exercise Vital Sign    Days of Exercise per Week: 0 days    Minutes of Exercise per Session: 10 min  Stress: No Stress Concern Present (09/27/2023)   Received from [facility] Occup[facility] - Occupational Stress Questionnaire    Feeling of Stress : Only a little  Social Connections: Socially Integrated (09/27/2023)   Received from [facility]   Social Network    How would you rate your social network (family, work, friends)?: Good participation with social networks  Intimate Partner Violence: Not At Risk (09/27/2023)   Received from [facility]   HITS    Over the last 12 months how often did your partner physically hurt you?: Never    Over  the last 12 months how often did your  partner insult you or talk down to you?: Fairly often    Over the last 12 months how often did your partner threaten you with physical harm?: Never    Over the last 12 months how often did your partner scream or curse at you?: Rarely    Family History:   The patient's family history includes Breast cancer in an other family member; Diabetes in her paternal grandfather.    ROS:  Please see the history of present illness.  All other ROS reviewed and negative.     Physical Exam/Data:   Vitals:   10/02/23 2133 10/02/23 2138 10/02/23 2143 10/02/23 2148  BP:      Pulse: (!) 0 (!) 0 (!) 0 (!) 0  Resp:      Temp:      SpO2:      Weight:      Height:       No intake or output data in the 24 hours ending 10/02/23 2202    10/02/2023    8:04 PM 04/30/2022    6:45 AM 05/24/2020    2:00 AM  Last 3 Weights  Weight (lbs) 179 lb 14.3 oz 203 lb 0.7 oz 203 lb 0.7 oz  Weight (kg) 81.6 kg 92.1 kg 92.1 kg     Body mass index is 29.94 kg/m.  General:  Well nourished, well developed, in mild discomfort HEENT: normal Neck: no JVD Vascular: No carotid bruits; Distal pulses 2+ bilaterally   Cardiac:  normal S1, S2; RRR; no murmur  Lungs:  clear to auscultation bilaterally, no wheezing, rhonchi or rales  Abd: soft, nontender, no hepatomegaly  Ext: no edema Musculoskeletal:  No deformities, BUE and BLE strength normal and equal Skin: warm and dry  Neuro:  CNs 2-12 intact, no focal abnormalities noted Psych:  Normal affect    EKG:  The ECG that was done  was personally reviewed and demonstrates inferior ST changes with inferior ST elevation  Relevant CV Studies: None  Laboratory Data:  High Sensitivity Troponin:   Recent Labs  Lab 10/02/23 1957  TROPONINIHS >24,000*      Chemistry Recent Labs  Lab 10/02/23 1957  NA 136  K 4.5  CL 105  CO2 20*  GLUCOSE 110*  BUN 16  CREATININE 1.23*  CALCIUM  8.5*  GFRNONAA 53*  ANIONGAP 11    No results for input(s): "PROT", "ALBUMIN",  "AST", "ALT", "ALKPHOS", "BILITOT" in the last 168 hours. Lipids No results for input(s): "CHOL", "TRIG", "HDL", "LABVLDL", "LDLCALC", "CHOLHDL" in the last 168 hours. Hematology Recent Labs  Lab 10/02/23 1957  WBC 15.5*  RBC 4.75  HGB 13.7  HCT 43.3  MCV 91.2  MCH 28.8  MCHC 31.6  RDW 16.9*  PLT 353   Thyroid No results for input(s): "TSH", "FREET4" in the last 168 hours. BNPNo results for input(s): "BNP", "PROBNP" in the last 168 hours.  DDimer No results for input(s): "DDIMER" in the last 168 hours.   Radiology/Studies:  CARDIAC CATHETERIZATION Result Date: 10/02/2023 1.  Inferior MI secondary to heavy thrombus in the right PDA and right PLA branches without any significant coronary stenoses 2.  Widely patent left main, LAD, left circumflex, and main RCA branch without significant stenosis 3.  Mild segmental LV dysfunction with hypokinesis of the basal and mid inferior wall 4.  Mildly elevated LVEDP Recommendations: In this patient with TIMI-3 flow, I elected not to perform aspiration thrombectomy due to risk of  embolization.  The patient does not appear to have any coronary lesions outside of the thrombotic appearance of her distal RCA branch vessels.  High suspicion for embolic event.  Will treat with 18 hours of Aggrastat and DAPT with aspirin  and ticagrelor.  Will check lower extremity venous duplex studies and would consider a transesophageal echo to evaluate for cardiac source of embolism.  If the patient does not have recurrent symptoms or worsening of her ST elevation, I do not know that she would really benefit from a relook catheterization.  Monitor in CV-ICU.    DG Chest Port 1 View Result Date: 10/02/2023 CLINICAL DATA:  Chest pain.  Elevated troponins. EXAM: PORTABLE CHEST 1 VIEW COMPARISON:  12/07/2021 FINDINGS: Heart size and pulmonary vascularity are normal. Lungs are clear. No pleural effusion or pneumothorax. Mediastinal contours appear intact. IMPRESSION: No active  disease. Electronically Signed   By: Boyce Byes M.D.   On: 10/02/2023 20:44     Assessment and Plan:   Inferior STEMI Found to have heavy clot burden in the distal RCA rPDA/rPAV bifurcation. Otherwise coronary arteries were normal. Given that this appeared to be embolic and she was hemodynamically stable with minimal ST elevation + TIMI 3 flow - decision was made to treat medically with GPIIa/IIIB inhibitor (aggrastat) and DAPT with aspirin /ticagrelor. Will need to check for source of emboli given prior history of embolic stroke (I.e. cryptogenic sources). Will order lower extremity dopplers and should consider TEE. If symptoms improve and ST elevation resolves then no need for further catheterization.  - continue aspirin /ticag -aggrastat for 18 hours  - rosuva 40 -tte with bubble study ordered -lower extremity venous ultrasound ordered   Type 2 diabetes -sliding scale insulin     Risk Assessment/Risk Scores:    TIMI Risk Score for ST  Elevation MI:   The patient's TIMI risk score is 5, which indicates a 12.4% risk of all cause mortality at 30 days.     Code Status: Full Code  Severity of Illness: The appropriate patient status for this patient is INPATIENT. Inpatient status is judged to be reasonable and necessary in order to provide the required intensity of service to ensure the patient's safety. The patient's presenting symptoms, physical exam findings, and initial radiographic and laboratory data in the context of their chronic comorbidities is felt to place them at high risk for further clinical deterioration. Furthermore, it is not anticipated that the patient will be medically stable for discharge from the hospital within 2 midnights of admission.   * I certify that at the point of admission it is my clinical judgment that the patient will require inpatient hospital care spanning beyond 2 midnights from the point of admission due to high intensity of service, high risk for  further deterioration and high frequency of surveillance required.*   For questions or updates, please contact [facility] Please consult [email or web address] for contact info under     Signed, Bert Britain, MD  10/02/2023 10:02 PM

## 2023-10-02 NOTE — ED Notes (Signed)
 Cardiology MD at bedside.

## 2023-10-03 ENCOUNTER — Other Ambulatory Visit (HOSPITAL_COMMUNITY): Payer: Self-pay

## 2023-10-03 ENCOUNTER — Other Ambulatory Visit: Payer: Self-pay

## 2023-10-03 ENCOUNTER — Inpatient Hospital Stay (HOSPITAL_COMMUNITY)

## 2023-10-03 ENCOUNTER — Telehealth (HOSPITAL_COMMUNITY): Payer: Self-pay | Admitting: Pharmacy Technician

## 2023-10-03 ENCOUNTER — Encounter (HOSPITAL_COMMUNITY): Payer: Self-pay | Admitting: Internal Medicine

## 2023-10-03 DIAGNOSIS — I748 Embolism and thrombosis of other arteries: Secondary | ICD-10-CM

## 2023-10-03 DIAGNOSIS — I2119 ST elevation (STEMI) myocardial infarction involving other coronary artery of inferior wall: Secondary | ICD-10-CM | POA: Diagnosis not present

## 2023-10-03 DIAGNOSIS — E785 Hyperlipidemia, unspecified: Secondary | ICD-10-CM

## 2023-10-03 DIAGNOSIS — I219 Acute myocardial infarction, unspecified: Secondary | ICD-10-CM

## 2023-10-03 DIAGNOSIS — Z8673 Personal history of transient ischemic attack (TIA), and cerebral infarction without residual deficits: Secondary | ICD-10-CM

## 2023-10-03 LAB — CBC
HCT: 41.6 % (ref 36.0–46.0)
Hemoglobin: 13.2 g/dL (ref 12.0–15.0)
MCH: 29.1 pg (ref 26.0–34.0)
MCHC: 31.7 g/dL (ref 30.0–36.0)
MCV: 91.8 fL (ref 80.0–100.0)
Platelets: 310 10*3/uL (ref 150–400)
RBC: 4.53 MIL/uL (ref 3.87–5.11)
RDW: 17.1 % — ABNORMAL HIGH (ref 11.5–15.5)
WBC: 12.2 10*3/uL — ABNORMAL HIGH (ref 4.0–10.5)
nRBC: 0.5 % — ABNORMAL HIGH (ref 0.0–0.2)

## 2023-10-03 LAB — LIPID PANEL
Cholesterol: 72 mg/dL (ref 0–200)
HDL: 21 mg/dL — ABNORMAL LOW (ref >40)
LDL Cholesterol: 8 mg/dL (ref 0–99)
Total CHOL/HDL Ratio: 3.4 ratio
Triglycerides: 217 mg/dL — ABNORMAL HIGH (ref <150)
VLDL: 43 mg/dL — ABNORMAL HIGH (ref 0–40)

## 2023-10-03 LAB — BASIC METABOLIC PANEL WITH GFR
Anion gap: 7 (ref 5–15)
BUN: 13 mg/dL (ref 6–20)
CO2: 24 mmol/L (ref 22–32)
Calcium: 8.1 mg/dL — ABNORMAL LOW (ref 8.9–10.3)
Chloride: 108 mmol/L (ref 98–111)
Creatinine, Ser: 1.04 mg/dL — ABNORMAL HIGH (ref 0.44–1.00)
GFR, Estimated: >60 mL/min (ref >60)
Glucose, Bld: 124 mg/dL — ABNORMAL HIGH (ref 70–99)
Potassium: 4.6 mmol/L (ref 3.5–5.1)
Sodium: 139 mmol/L (ref 135–145)

## 2023-10-03 LAB — ECHOCARDIOGRAM COMPLETE BUBBLE STUDY
Area-P 1/2: 4.17 cm2
Est EF: 50
S' Lateral: 3.7 cm
Single Plane A2C EF: 55.1 %

## 2023-10-03 LAB — HIV ANTIBODY (ROUTINE TESTING W REFLEX): HIV Screen 4th Generation wRfx: NONREACTIVE

## 2023-10-03 LAB — GLUCOSE, CAPILLARY
Glucose-Capillary: 172 mg/dL — ABNORMAL HIGH (ref 70–99)
Glucose-Capillary: 97 mg/dL (ref 70–99)
Glucose-Capillary: 83 mg/dL (ref 70–99)
Glucose-Capillary: 102 mg/dL — ABNORMAL HIGH (ref 70–99)

## 2023-10-03 MED ORDER — APIXABAN 5 MG PO TABS
5.0000 mg | ORAL_TABLET | Freq: Two times a day (BID) | ORAL | Status: DC
  Administered 2023-10-04: 5 mg via ORAL
  Filled 2023-10-03: qty 1

## 2023-10-03 MED ORDER — TRAZODONE HCL 50 MG PO TABS
200.0000 mg | ORAL_TABLET | Freq: Every day | ORAL | Status: DC
  Administered 2023-10-03: 200 mg via ORAL
  Filled 2023-10-03: qty 4

## 2023-10-03 MED ORDER — SODIUM CHLORIDE 0.9 % IV SOLN: INTRAVENOUS | Status: DC

## 2023-10-03 MED ORDER — ESCITALOPRAM OXALATE 10 MG PO TABS
20.0000 mg | ORAL_TABLET | Freq: Every day | ORAL | Status: DC
  Administered 2023-10-03: 20 mg via ORAL
  Filled 2023-10-03: qty 2

## 2023-10-03 MED ORDER — LAMOTRIGINE 25 MG PO TABS
25.0000 mg | ORAL_TABLET | Freq: Every day | ORAL | Status: DC
  Administered 2023-10-03: 25 mg via ORAL
  Filled 2023-10-03 (×2): qty 1

## 2023-10-03 MED ORDER — CLOPIDOGREL BISULFATE 75 MG PO TABS
75.0000 mg | ORAL_TABLET | Freq: Every day | ORAL | Status: DC
  Administered 2023-10-04: 75 mg via ORAL
  Filled 2023-10-03: qty 1

## 2023-10-03 MED ORDER — PERFLUTREN LIPID MICROSPHERE
1.0000 mL | INTRAVENOUS | Status: AC | PRN
  Administered 2023-10-03: 2 mL via INTRAVENOUS

## 2023-10-03 MED ORDER — ORAL CARE MOUTH RINSE: 15.0000 mL | OROMUCOSAL | Status: DC | PRN

## 2023-10-03 MED ORDER — TICAGRELOR 90 MG PO TABS
90.0000 mg | ORAL_TABLET | Freq: Two times a day (BID) | ORAL | Status: AC
  Administered 2023-10-03: 90 mg via ORAL
  Filled 2023-10-03: qty 1

## 2023-10-03 MED ORDER — CHLORHEXIDINE GLUCONATE CLOTH 2 % EX PADS
6.0000 | MEDICATED_PAD | Freq: Every day | CUTANEOUS | Status: DC
  Administered 2023-10-03 – 2023-10-04 (×2): 6 via TOPICAL

## 2023-10-03 NOTE — Telephone Encounter (Signed)
 Patient Product/process development scientist completed.    The patient is insured through [employer]. Patient has Medicare and is not eligible for a copay card, but may be able to apply for patient assistance or Medicare RX Payment Plan (Patient Must reach out to their plan, if eligible for payment plan), if available.    Ran test claim for Eliquis 5 mg and the current 30 day co-pay is $0.00.   This test claim was processed through [facility]- copay amounts may vary at other pharmacies due to pharmacy/plan contracts, or as the patient moves through the different stages of their insurance plan.     Roland Earl, CPHT Pharmacy Technician III Certified Patient Advocate [facility] Patient Advocate Team Direct Number: [phone]  Fax: [phone]

## 2023-10-03 NOTE — Progress Notes (Addendum)
 Progress Note  Patient Name: Kristie Tapia Date of Encounter: 10/03/2023  Primary Cardiologist: None   Subjective   Her primary complaint this morning is dyspnea. She notes that it started this morning roughly when she took her applesauce/pills. She also has mild chest tightness.  Inpatient Medications    Scheduled Meds:  aspirin  EC  81 mg Oral Daily   Chlorhexidine  Gluconate Cloth  6 each Topical Daily   [START ON 10/04/2023] enoxaparin  (LOVENOX ) injection  40 mg Subcutaneous Q24H   insulin  aspart  0-15 Units Subcutaneous TID WC   insulin  aspart  0-5 Units Subcutaneous QHS   pantoprazole   40 mg Oral BID   rosuvastatin   40 mg Oral QHS   sodium chloride  flush  3 mL Intravenous Q12H   ticagrelor   90 mg Oral BID   Continuous Infusions:  sodium chloride      tirofiban  0.15 mcg/kg/min (10/03/23 1033)   PRN Meds: sodium chloride , acetaminophen , diazepam , ondansetron  (ZOFRAN ) IV, mouth rinse, perflutren  lipid microspheres (DEFINITY ) IV suspension, sodium chloride  flush   Vital Signs    Vitals:   10/03/23 0654 10/03/23 0700 10/03/23 0800 10/03/23 0900  BP:  (!) 109/52 (!) 95/56 103/61  Pulse: 66 62 (!) 58 60  Resp: 12 15 15  (!) 21  Temp:   (!) 97 F (36.1 C)   TempSrc:   Axillary   SpO2: 95% 93% 96% 96%  Weight:      Height:        Intake/Output Summary (Last 24 hours) at 10/03/2023 1050 Last data filed at 10/03/2023 1000 Gross per 24 hour  Intake 731.57 ml  Output 50 ml  Net 681.57 ml   Filed Weights   10/02/23 2004 10/02/23 2200 10/03/23 0600  Weight: 81.6 kg 82.7 kg 82.7 kg    Telemetry    NSR - Personally Reviewed  EKG: Sinus rhythm with 1  AVB biphasic ST and T waves in III and aVF-likely with 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  Wearing oxygen [place]. Neck: No JVD Cardiac: RRR, no murmurs, rubs, or gallops.  Respiratory: Clear to auscultation bilaterally.  Nonlabored, good air movement. GI: Soft,  nontender, non-distended  MS: No edema; No deformity.->  Radial cath site clean dry and intact Neuro:  Nonfocal  Psych: Normal affect   Labs    Chemistry Recent Labs  Lab 10/02/23 1957 10/02/23 2212 10/03/23 0310  NA 136  --  139  K 4.5  --  4.6  CL 105  --  108  CO2 20*  --  24  GLUCOSE 110*  --  124*  BUN 16  --  13  CREATININE 1.23* 1.24* 1.04*  CALCIUM  8.5*  --  8.1*  GFRNONAA 53* 52* >60  ANIONGAP 11  --  7     Hematology Recent Labs  Lab 10/02/23 1957 10/02/23 2212 10/03/23 0310  WBC 15.5* 15.8* 12.2*  RBC 4.75 4.56 4.53  HGB 13.7 13.5 13.2  HCT 43.3 41.4 41.6  MCV 91.2 90.8 91.8  MCH 28.8 29.6 29.1  MCHC 31.6 32.6 31.7  RDW 16.9* 17.0* 17.1*  PLT 353 326 310    Cardiac EnzymesNo results for input(s): "TROPONINI" in the last 168 hours. No results for input(s): "TROPIPOC" in the last 168 hours.   BNPNo results for input(s): "BNP", "PROBNP" in the last 168 hours.   DDimer No results for input(s): "DDIMER" in the last 168 hours.   Radiology  Cardiac Studies   10/03/23  Coronary/Graft Acute MI Revascularization LHC and Coronary angiography  1.  Inferior MI secondary to heavy thrombus in the right PDA and right PLA branches without any significant coronary stenoses 2.  Widely patent left main, LAD, left circumflex, and main RCA branch without significant stenosis 3.  Mild segmental LV dysfunction with hypokinesis of the basal and mid inferior wall 4.  Mildly elevated LVEDP     Recommendations: In this patient with TIMI-3 flow, I elected not to perform aspiration thrombectomy due to risk of embolization.  The patient does not appear to have any coronary lesions outside of the thrombotic appearance of her distal RCA branch vessels.  High suspicion for embolic event.  Will treat with 18 hours of Aggrastat  and DAPT with aspirin  and ticagrelor .  Will check lower extremity venous duplex studies and would consider a transesophageal echo to evaluate for  cardiac source of embolism.  If the patient does not have recurrent symptoms or worsening of her ST elevation, I do not know that she would really benefit from a relook catheterization.  Monitor in CV-ICU.    TTE 10/03/2023: EF~50% with mildly decreased function.  Severe hypokinesis of the basal to mid inferior wall.  GR 2 DD.  Unable to assess PAP.  Bubble study insufficient to exclude PFO/ASD normal valves.  Mildly elevated RAP.  (Technically difficult study) Lower extremity DVT Dopplers (10/03/2023)  Patient Profile     Kristie Tapia is a 53 y.o. female with T2DM, OSA who is being seen 10/02/2023 for the evaluation of STEMI and chest pain.   Assessment & Plan    Inferior STEMI  2/2 heavy thrombus in the right PDA and right PLA branches without any significant coronary stenoses  Concern for embolic etiology  Per LHC, heavy clot burden in the distal RCA rPDA/rPAV bifurcation with otherwise normal coronary arteries. Her MI is presumed to be embolic and she is hemodynamically stable with minimal ST elevation + TIMI 3 flow. Thus, will treat medically and look for source of emboli. Notably, she has a history of embolic stroke in 2019 with unclear source.  - Aggrastat  post-procedure x 18 hours to end this am and DAPT with aspirin  81 /ticagrelor  90 daily - Rosuvastatin  40 daily - Awaiting results of LE doppler & TTE today, TEE tomorrow TTE on my initial assessment look to have relatively normal EF with inferior hypokinesis confirmed by formal read.  Bubble study was inconclusive due to poor windows.  Plan is for TEE tomorrow to evaluate for cardioembolic source.  Informed Consent   Shared Decision Making/Informed Consent   The risks [esophageal damage, perforation (1:10,000 risk), bleeding, pharyngeal hematoma as well as other potential complications associated with conscious sedation including aspiration, arrhythmia, respiratory failure and death], benefits (treatment guidance and diagnostic  support) and alternatives of a transesophageal echocardiogram were discussed in detail with Kristie Tapia and she is willing to proceed.      Based on her having prior issues with cryptogenic CVA episodes and now with potential thromboembolic event causing MRI, I discussed with Dr. Arlester Ladd and we are concerned that she may likely have some type of coagulopathy that would be very difficult to diagnose in the setting of ongoing antiplatelet and Antithrombin therapy.  Would need outpatient assessment once were safely able to stop these medications.  For now the plan will be just convert from DAPT to Plavix  plus DOAC. Will treat with Aggrastat  completing today and then final dose of Brilinta  tonight then as  of 5-25 we will convert to Plavix  700 mg daily without load given no PCI, stop aspirin , and start Eliquis  5 mg twice daily   Dyspnea Started this morning and may be side effect of ticagrelor . Ongoing embolic workup but low concern for PE at this time, vitals stable. Awaiting imaging per above for cardiac evaluation. Will monitor. She is due for 1 more dose of Brilinta  but we are switching to Plavix  tomorrow so that takes away that concern.  She did have some elevated filling pressures but does not seem to be hypervolemic.  If symptoms worsen, can consider diuretic but I suspect that she should normalized today as she diuresis.  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 Was on Plavix  after for several years but has since stopped. This MI would constitute a second embolic event in the last several years and she may benefit from prolonged anticoagulation. Will look into this.  TEE planned for tomorrow, will then convert from DAPT (ASA/Brilinta ) to Plavix /DOAC tomorrow  Type 2 diabetes Sliding scale insulin   Resume home meds on discharge  For questions or updates, please contact [facility] Please consult [email or web address] for contact info under Cardiology/STEMI.    Signed, Carleen Chary, DO  IM Resident PGY-1 10/03/2023, 10:50 AM   ATTENDING ATTESTATION  I have seen, examined and evaluated the patient this morning on rounds along with Carleen Chary, DO .   After reviewing all the available data and chart, we discussed the patients laboratory, study & physical findings as well as symptoms in detail.  I agree with the findings, (our) examination as well as the impression recommendations as per our discussion.    Attending adjustments noted in italics.   Interesting scenario with what appears to potentially be a thromboembolic MI with extensive clot in the PDA and PL but no obvious culprit lesion for plaque rupture.  Echocardiogram not very revealing as far as any PFO or or ASD. Not unexpectedly there is some mild inferior hypokinesis on the echo but EF is still preserved.  To exclude ASD or PFO we will perform TEE tomorrow and or converting to Plavix /DOAC (Eliquis ) tomorrow from DAPT to cover for both arterial and venous thrombosis as potential sources.  I expect that if she is stable following TEE tomorrow, blood pressure and other cardiac standpoint she potentially could be discharged once stable post TEE. She will need close follow-up and determination of when she can safely stop antiplatelet/anticoagulation therapy to order thrombophilia profile.     Randene Bustard, MD, MS Randene Bustard, M.D., M.S. Interventional Chartered certified accountant  Pager # [phone]

## 2023-10-03 NOTE — Plan of Care (Signed)
  Problem: Clinical Measurements: Goal: Ability to maintain clinical measurements within normal limits will improve Outcome: Progressing Goal: Will remain free from infection Outcome: Progressing Goal: Diagnostic test results will improve Outcome: Progressing   Problem: Nutrition: Goal: Adequate nutrition will be maintained Outcome: Progressing   Problem: Coping: Goal: Level of anxiety will decrease Outcome: Progressing   

## 2023-10-03 NOTE — H&P (View-Only) (Signed)
 Progress Note  Patient Name: Kristie Tapia Date of Encounter: 10/03/2023  Primary Cardiologist: None   Subjective   Her primary complaint this morning is dyspnea. She notes that it started this morning roughly when she took her applesauce/pills. She also has mild chest tightness.  Inpatient Medications    Scheduled Meds:  aspirin  EC  81 mg Oral Daily   Chlorhexidine  Gluconate Cloth  6 each Topical Daily   [START ON 10/04/2023] enoxaparin  (LOVENOX ) injection  40 mg Subcutaneous Q24H   insulin  aspart  0-15 Units Subcutaneous TID WC   insulin  aspart  0-5 Units Subcutaneous QHS   pantoprazole   40 mg Oral BID   rosuvastatin   40 mg Oral QHS   sodium chloride  flush  3 mL Intravenous Q12H   ticagrelor   90 mg Oral BID   Continuous Infusions:  sodium chloride      tirofiban  0.15 mcg/kg/min (10/03/23 1033)   PRN Meds: sodium chloride , acetaminophen , diazepam , ondansetron  (ZOFRAN ) IV, mouth rinse, perflutren  lipid microspheres (DEFINITY ) IV suspension, sodium chloride  flush   Vital Signs    Vitals:   10/03/23 0654 10/03/23 0700 10/03/23 0800 10/03/23 0900  BP:  (!) 109/52 (!) 95/56 103/61  Pulse: 66 62 (!) 58 60  Resp: 12 15 15  (!) 21  Temp:   (!) 97 F (36.1 C)   TempSrc:   Axillary   SpO2: 95% 93% 96% 96%  Weight:      Height:        Intake/Output Summary (Last 24 hours) at 10/03/2023 1050 Last data filed at 10/03/2023 1000 Gross per 24 hour  Intake 731.57 ml  Output 50 ml  Net 681.57 ml   Filed Weights   10/02/23 2004 10/02/23 2200 10/03/23 0600  Weight: 81.6 kg 82.7 kg 82.7 kg    Telemetry    NSR - Personally Reviewed  EKG: Sinus rhythm with 1  AVB biphasic ST and T waves in III and aVF-likely with 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  Wearing oxygen [place]. Neck: No JVD Cardiac: RRR, no murmurs, rubs, or gallops.  Respiratory: Clear to auscultation bilaterally.  Nonlabored, good air movement. GI: Soft,  nontender, non-distended  MS: No edema; No deformity.->  Radial cath site clean dry and intact Neuro:  Nonfocal  Psych: Normal affect   Labs    Chemistry Recent Labs  Lab 10/02/23 1957 10/02/23 2212 10/03/23 0310  NA 136  --  139  K 4.5  --  4.6  CL 105  --  108  CO2 20*  --  24  GLUCOSE 110*  --  124*  BUN 16  --  13  CREATININE 1.23* 1.24* 1.04*  CALCIUM  8.5*  --  8.1*  GFRNONAA 53* 52* >60  ANIONGAP 11  --  7     Hematology Recent Labs  Lab 10/02/23 1957 10/02/23 2212 10/03/23 0310  WBC 15.5* 15.8* 12.2*  RBC 4.75 4.56 4.53  HGB 13.7 13.5 13.2  HCT 43.3 41.4 41.6  MCV 91.2 90.8 91.8  MCH 28.8 29.6 29.1  MCHC 31.6 32.6 31.7  RDW 16.9* 17.0* 17.1*  PLT 353 326 310    Cardiac EnzymesNo results for input(s): "TROPONINI" in the last 168 hours. No results for input(s): "TROPIPOC" in the last 168 hours.   BNPNo results for input(s): "BNP", "PROBNP" in the last 168 hours.   DDimer No results for input(s): "DDIMER" in the last 168 hours.   Radiology  Cardiac Studies   10/03/23  Coronary/Graft Acute MI Revascularization LHC and Coronary angiography  1.  Inferior MI secondary to heavy thrombus in the right PDA and right PLA branches without any significant coronary stenoses 2.  Widely patent left main, LAD, left circumflex, and main RCA branch without significant stenosis 3.  Mild segmental LV dysfunction with hypokinesis of the basal and mid inferior wall 4.  Mildly elevated LVEDP     Recommendations: In this patient with TIMI-3 flow, I elected not to perform aspiration thrombectomy due to risk of embolization.  The patient does not appear to have any coronary lesions outside of the thrombotic appearance of her distal RCA branch vessels.  High suspicion for embolic event.  Will treat with 18 hours of Aggrastat  and DAPT with aspirin  and ticagrelor .  Will check lower extremity venous duplex studies and would consider a transesophageal echo to evaluate for  cardiac source of embolism.  If the patient does not have recurrent symptoms or worsening of her ST elevation, I do not know that she would really benefit from a relook catheterization.  Monitor in CV-ICU.    TTE 10/03/2023: EF~50% with mildly decreased function.  Severe hypokinesis of the basal to mid inferior wall.  GR 2 DD.  Unable to assess PAP.  Bubble study insufficient to exclude PFO/ASD normal valves.  Mildly elevated RAP.  (Technically difficult study) Lower extremity DVT Dopplers (10/03/2023)  Patient Profile     Kristie Tapia is a 53 y.o. female with T2DM, OSA who is being seen 10/02/2023 for the evaluation of STEMI and chest pain.   Assessment & Plan    Inferior STEMI  2/2 heavy thrombus in the right PDA and right PLA branches without any significant coronary stenoses  Concern for embolic etiology  Per LHC, heavy clot burden in the distal RCA rPDA/rPAV bifurcation with otherwise normal coronary arteries. Her MI is presumed to be embolic and she is hemodynamically stable with minimal ST elevation + TIMI 3 flow. Thus, will treat medically and look for source of emboli. Notably, she has a history of embolic stroke in 2019 with unclear source.  - Aggrastat  post-procedure x 18 hours to end this am and DAPT with aspirin  81 /ticagrelor  90 daily - Rosuvastatin  40 daily - Awaiting results of LE doppler & TTE today, TEE tomorrow TTE on my initial assessment look to have relatively normal EF with inferior hypokinesis confirmed by formal read.  Bubble study was inconclusive due to poor windows.  Plan is for TEE tomorrow to evaluate for cardioembolic source.  Informed Consent   Shared Decision Making/Informed Consent   The risks [esophageal damage, perforation (1:10,000 risk), bleeding, pharyngeal hematoma as well as other potential complications associated with conscious sedation including aspiration, arrhythmia, respiratory failure and death], benefits (treatment guidance and diagnostic  support) and alternatives of a transesophageal echocardiogram were discussed in detail with Kristie Tapia and she is willing to proceed.      Based on her having prior issues with cryptogenic CVA episodes and now with potential thromboembolic event causing MRI, I discussed with Dr. Arlester Ladd and we are concerned that she may likely have some type of coagulopathy that would be very difficult to diagnose in the setting of ongoing antiplatelet and Antithrombin therapy.  Would need outpatient assessment once were safely able to stop these medications.  For now the plan will be just convert from DAPT to Plavix  plus DOAC. Will treat with Aggrastat  completing today and then final dose of Brilinta  tonight then as  of 5-25 we will convert to Plavix  700 mg daily without load given no PCI, stop aspirin , and start Eliquis  5 mg twice daily   Dyspnea Started this morning and may be side effect of ticagrelor . Ongoing embolic workup but low concern for PE at this time, vitals stable. Awaiting imaging per above for cardiac evaluation. Will monitor. She is due for 1 more dose of Brilinta  but we are switching to Plavix  tomorrow so that takes away that concern.  She did have some elevated filling pressures but does not seem to be hypervolemic.  If symptoms worsen, can consider diuretic but I suspect that she should normalized today as she diuresis.  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 Was on Plavix  after for several years but has since stopped. This MI would constitute a second embolic event in the last several years and she may benefit from prolonged anticoagulation. Will look into this.  TEE planned for tomorrow, will then convert from DAPT (ASA/Brilinta ) to Plavix /DOAC tomorrow  Type 2 diabetes Sliding scale insulin   Resume home meds on discharge  For questions or updates, please contact [facility] Please consult [email or web address] for contact info under Cardiology/STEMI.    Signed, Carleen Chary, DO  IM Resident PGY-1 10/03/2023, 10:50 AM   ATTENDING ATTESTATION  I have seen, examined and evaluated the patient this morning on rounds along with Carleen Chary, DO .   After reviewing all the available data and chart, we discussed the patients laboratory, study & physical findings as well as symptoms in detail.  I agree with the findings, (our) examination as well as the impression recommendations as per our discussion.    Attending adjustments noted in italics.   Interesting scenario with what appears to potentially be a thromboembolic MI with extensive clot in the PDA and PL but no obvious culprit lesion for plaque rupture.  Echocardiogram not very revealing as far as any PFO or or ASD. Not unexpectedly there is some mild inferior hypokinesis on the echo but EF is still preserved.  To exclude ASD or PFO we will perform TEE tomorrow and or converting to Plavix /DOAC (Eliquis ) tomorrow from DAPT to cover for both arterial and venous thrombosis as potential sources.  I expect that if she is stable following TEE tomorrow, blood pressure and other cardiac standpoint she potentially could be discharged once stable post TEE. She will need close follow-up and determination of when she can safely stop antiplatelet/anticoagulation therapy to order thrombophilia profile.     Randene Bustard, MD, MS Randene Bustard, M.D., M.S. Interventional Chartered certified accountant  Pager # [phone]

## 2023-10-03 NOTE — Progress Notes (Signed)
  Echocardiogram 2D Echocardiogram has been performed.  Fain Home RDCS 10/03/2023, 9:26 AM

## 2023-10-03 NOTE — Telephone Encounter (Signed)
 Patient Product/process development scientist completed.    The patient is insured through [employer]. Patient has Medicare and is not eligible for a copay card, but may be able to apply for patient assistance or Medicare RX Payment Plan (Patient Must reach out to their plan, if eligible for payment plan), if available.    Ran test claim for Brilinta  90 mg and the current 30 day co-pay is $0.00.   This test claim was processed through [facility]- copay amounts may vary at other pharmacies due to pharmacy/plan contracts, or as the patient moves through the different stages of their insurance plan.     Morgan Arab, CPHT Pharmacy Technician III Certified Patient Advocate [facility] Patient Advocate Team Direct Number: [phone]  Fax: [phone]

## 2023-10-03 NOTE — Progress Notes (Signed)
 Lower extremity venous duplex completed. Please see CV Procedures for preliminary results.  Estanislao Heimlich, RVT 10/03/23 9:56 AM

## 2023-10-04 ENCOUNTER — Telehealth: Payer: Self-pay | Admitting: Student

## 2023-10-04 ENCOUNTER — Encounter (HOSPITAL_COMMUNITY)
Admission: EM | Disposition: A | Discharge status: Home / Self Care | Payer: Self-pay | Source: Home / Self Care | Attending: Cardiovascular Disease

## 2023-10-04 ENCOUNTER — Inpatient Hospital Stay (HOSPITAL_COMMUNITY): Payer: Self-pay | Admitting: Anesthesiology

## 2023-10-04 ENCOUNTER — Inpatient Hospital Stay (HOSPITAL_COMMUNITY)

## 2023-10-04 ENCOUNTER — Other Ambulatory Visit (HOSPITAL_COMMUNITY): Payer: Self-pay

## 2023-10-04 DIAGNOSIS — F17210 Nicotine dependence, cigarettes, uncomplicated: Secondary | ICD-10-CM

## 2023-10-04 DIAGNOSIS — Q2112 Patent foramen ovale: Secondary | ICD-10-CM

## 2023-10-04 DIAGNOSIS — I10 Essential (primary) hypertension: Secondary | ICD-10-CM

## 2023-10-04 DIAGNOSIS — I219 Acute myocardial infarction, unspecified: Secondary | ICD-10-CM

## 2023-10-04 DIAGNOSIS — I2510 Atherosclerotic heart disease of native coronary artery without angina pectoris: Secondary | ICD-10-CM

## 2023-10-04 DIAGNOSIS — Z794 Long term (current) use of insulin: Secondary | ICD-10-CM

## 2023-10-04 DIAGNOSIS — E119 Type 2 diabetes mellitus without complications: Secondary | ICD-10-CM

## 2023-10-04 DIAGNOSIS — E1159 Type 2 diabetes mellitus with other circulatory complications: Secondary | ICD-10-CM

## 2023-10-04 HISTORY — PX: TRANSESOPHAGEAL ECHOCARDIOGRAM (CATH LAB): EP1270

## 2023-10-04 LAB — ECHO TEE
AR max vel: 2.19 cm2
AV Peak grad: 5.9 mmHg
Ao pk vel: 1.21 m/s

## 2023-10-04 LAB — CBC
HCT: 40.9 % (ref 36.0–46.0)
Hemoglobin: 13.5 g/dL (ref 12.0–15.0)
MCH: 29.3 pg (ref 26.0–34.0)
MCHC: 33 g/dL (ref 30.0–36.0)
MCV: 88.9 fL (ref 80.0–100.0)
Platelets: 274 10*3/uL (ref 150–400)
RBC: 4.6 MIL/uL (ref 3.87–5.11)
RDW: 16.8 % — ABNORMAL HIGH (ref 11.5–15.5)
WBC: 11.7 10*3/uL — ABNORMAL HIGH (ref 4.0–10.5)
nRBC: 0.3 % — ABNORMAL HIGH (ref 0.0–0.2)

## 2023-10-04 LAB — BASIC METABOLIC PANEL WITH GFR
Anion gap: 8 (ref 5–15)
BUN: 8 mg/dL (ref 6–20)
CO2: 22 mmol/L (ref 22–32)
Calcium: 8.7 mg/dL — ABNORMAL LOW (ref 8.9–10.3)
Chloride: 107 mmol/L (ref 98–111)
Creatinine, Ser: 0.94 mg/dL (ref 0.44–1.00)
GFR, Estimated: >60 mL/min (ref >60)
Glucose, Bld: 95 mg/dL (ref 70–99)
Potassium: 4.1 mmol/L (ref 3.5–5.1)
Sodium: 137 mmol/L (ref 135–145)

## 2023-10-04 LAB — LIPOPROTEIN A (LPA): Lipoprotein (a): 47.5 nmol/L — ABNORMAL HIGH (ref <75.0)

## 2023-10-04 LAB — GLUCOSE, CAPILLARY
Glucose-Capillary: 113 mg/dL — ABNORMAL HIGH (ref 70–99)
Glucose-Capillary: 97 mg/dL (ref 70–99)

## 2023-10-04 LAB — ANTITHROMBIN III: AntiThromb III Func: 105 % (ref 75–120)

## 2023-10-04 SURGERY — TRANSESOPHAGEAL ECHOCARDIOGRAM (TEE) (CATHLAB)
Anesthesia: Monitor Anesthesia Care

## 2023-10-04 MED ORDER — ROSUVASTATIN CALCIUM 40 MG PO TABS
40.0000 mg | ORAL_TABLET | Freq: Every day | ORAL | 3 refills | Status: DC
  Filled 2023-10-04: qty 90, 90d supply, fill #0

## 2023-10-04 MED ORDER — BUTAMBEN-TETRACAINE-BENZOCAINE 2-2-14 % EX AERO
INHALATION_SPRAY | CUTANEOUS | Status: DC | PRN
  Administered 2023-10-04: 1 via TOPICAL

## 2023-10-04 MED ORDER — LIDOCAINE 2% (20 MG/ML) 5 ML SYRINGE
INTRAMUSCULAR | Status: DC | PRN
  Administered 2023-10-04: 100 mg via INTRAVENOUS

## 2023-10-04 MED ORDER — CLOPIDOGREL BISULFATE 75 MG PO TABS
75.0000 mg | ORAL_TABLET | Freq: Every day | ORAL | 3 refills | Status: DC
  Filled 2023-10-04: qty 30, 30d supply, fill #0

## 2023-10-04 MED ORDER — PREGABALIN 100 MG PO CAPS: 100.0000 mg | ORAL_CAPSULE | Freq: Every day | ORAL | Status: DC

## 2023-10-04 MED ORDER — APIXABAN 5 MG PO TABS
5.0000 mg | ORAL_TABLET | Freq: Two times a day (BID) | ORAL | 3 refills | Status: DC
  Filled 2023-10-04: qty 60, 30d supply, fill #0

## 2023-10-04 MED ORDER — FAMOTIDINE 20 MG PO TABS: 40.0000 mg | ORAL_TABLET | Freq: Every day | ORAL | Status: DC

## 2023-10-04 MED ORDER — PROPOFOL 10 MG/ML IV BOLUS
INTRAVENOUS | Status: DC | PRN
  Administered 2023-10-04: 40 mg via INTRAVENOUS
  Administered 2023-10-04: 30 mg via INTRAVENOUS

## 2023-10-04 MED ORDER — PREGABALIN 100 MG PO CAPS: 200.0000 mg | ORAL_CAPSULE | Freq: Every day | ORAL | Status: DC

## 2023-10-04 MED ORDER — PROPOFOL 500 MG/50ML IV EMUL
INTRAVENOUS | Status: DC | PRN
  Administered 2023-10-04: 75 ug/kg/min via INTRAVENOUS

## 2023-10-04 NOTE — Progress Notes (Addendum)
 Pt off the monitor, ready for d/c. RN confirms she has ambulated and pt sts she felt "ok". She is limited by a broken patella, in a brace.   Discussed with pt MI, restrictions, smoking cessation, diet, and CRPII for the future. Encouraged walking as her knee will allow. She is contemplative of smoking cessation, resources given. She is generally anxious in the room and prefers to sit in the hallway. Will refer to [place] CRPII however this will be "down the road" due to need for PFO assessment and PT for knee.  [id number] German Koller BS, ACSM-CEP 10/04/2023 12:54 PM

## 2023-10-04 NOTE — Interval H&P Note (Signed)
 History and Physical Interval Note:  10/04/2023 7:47 AM  Minor Amble  has presented today for surgery, with the diagnosis of r/o clot.  The various methods of treatment have been discussed with the patient and family. After consideration of risks, benefits and other options for treatment, the patient has consented to  Procedure(s): TRANSESOPHAGEAL ECHOCARDIOGRAM (N/A) as a surgical intervention.  The patient's history has been reviewed, patient examined, no change in status, stable for surgery.  I have reviewed the patient's chart and labs.  Questions were answered to the patient's satisfaction.     Graydon Fofana A Cristie Mckinney

## 2023-10-04 NOTE — Transfer of Care (Signed)
 Immediate Anesthesia Transfer of Care Note  Patient: Kristie Tapia  Procedure(s) Performed: TRANSESOPHAGEAL ECHOCARDIOGRAM  Patient Location: PACU  Anesthesia Type:MAC  Level of Consciousness: awake and patient cooperative  Airway & Oxygen Therapy: Patient Spontanous Breathing  Post-op Assessment: Report given to RN and Post -op Vital signs reviewed and stable  Post vital signs: Reviewed and stable      Last Vitals:  Vitals Value Taken Time  BP    Temp    Pulse    Resp    SpO2      Last Pain:  Vitals:   10/04/23 0745  TempSrc:   PainSc: 0-No pain         Complications: No notable events documented.

## 2023-10-04 NOTE — Anesthesia Preprocedure Evaluation (Signed)
 Anesthesia Evaluation  Patient identified by MRN, date of birth, ID band Patient awake    Reviewed: Allergy & Precautions, NPO status , Patient's Chart, lab work & pertinent test results  Airway Mallampati: II  TM Distance: >3 FB Neck ROM: Full    Dental  (+) Edentulous Upper, Edentulous Lower   Pulmonary Current Smoker and Patient abstained from smoking.   Pulmonary exam normal        Cardiovascular + CAD and + Past MI  Normal cardiovascular exam     Neuro/Psych  Headaches  Anxiety      Neuromuscular disease CVA    GI/Hepatic Neg liver ROS, hiatal hernia,,,  Endo/Other  diabetes, Insulin  Dependent    Renal/GU negative Renal ROS     Musculoskeletal negative musculoskeletal ROS (+)    Abdominal   Peds  Hematology negative hematology ROS (+)   Anesthesia Other Findings r/o clot  Reproductive/Obstetrics                             Anesthesia Physical Anesthesia Plan  ASA: 3  Anesthesia Plan: MAC   Post-op Pain Management:    Induction: Intravenous  PONV Risk Score and Plan: 1 and Propofol  infusion and Treatment may vary due to age or medical condition  Airway Management Planned: Nasal Cannula  Additional Equipment:   Intra-op Plan:   Post-operative Plan:   Informed Consent: I have reviewed the patients History and Physical, chart, labs and discussed the procedure including the risks, benefits and alternatives for the proposed anesthesia with the patient or authorized representative who has indicated his/her understanding and acceptance.     Dental advisory given  Plan Discussed with: CRNA  Anesthesia Plan Comments:        Anesthesia Quick Evaluation

## 2023-10-04 NOTE — CV Procedure (Signed)
 INDICATIONS: Stroke, embolic MI  PROCEDURE:   Informed consent was obtained prior to the procedure. The risks, benefits and alternatives for the procedure were discussed and the patient comprehended these risks.  Risks include, but are not limited to, cough, sore throat, vomiting, nausea, somnolence, esophageal and stomach trauma or perforation, bleeding, low blood pressure, aspiration, pneumonia, infection, trauma to the teeth and death.    After a procedural time-out, the oropharynx was anesthetized with 20% benzocaine spray.   During this procedure the patient was administered propofol  per anesthesia.  The patient's heart rate, blood pressure, and oxygen saturation were monitored continuously during the procedure. The period of conscious sedation was 30 minutes, of which I was present face-to-face 100% of this time.  The transesophageal probe was inserted in the esophagus and stomach without difficulty and multiple views were obtained.  The patient was kept under observation until the patient left the procedure room.  The patient left the procedure room in stable condition.   Agitated microbubble saline contrast was administered.  COMPLICATIONS:    There were no immediate complications.  FINDINGS:   FORMAL ECHOCARDIOGRAM REPORT PENDING  Study was very challenging due to constant patient coughing. Views and study limited.   PFO, Grade 2 right to left shunt with agitated saline injection. Shunt appears bidirectional.  Normal Biventricular function. Grossly normal valves. Normal, small LAA with no thrombus.    Time Spent Directly with the Patient:  45 minutes   Carlynn Leduc A Georgean Spainhower 10/04/2023, 9:11 AM

## 2023-10-04 NOTE — Progress Notes (Signed)
 Patient seen and evaluated with resident for TEE showing PFO.  Discussed with Dr. Arlester Ladd.  Plan is to continue with the conversion of antiplatelet/anticoagulant to Plavix  plus DOAC (Eliquis  5 mg twice daily) and plan for close follow-up with Dr. Arlester Ladd to discuss the possibility of PFO closure.  We then will determine what the most appropriate therapy would be going forward.  We will check hypercoagulable panel now and then potentially again in the future to evaluate for any hypercoagulable states.  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  Also discussed smoking cessation counseling. On rosuvastatin  40 mg for lipid control. Blood pressure is well-controlled on no medications.  Based on the lack of evidence of CAD, we will hold off on management. No signs or symptoms of angina or CHF now.  Okay for discharge.    Randene Bustard

## 2023-10-04 NOTE — Progress Notes (Signed)
 Echocardiogram Echocardiogram Transesophageal has been performed.  Kristie Tapia 10/04/2023, 9:29 AM

## 2023-10-04 NOTE — Discharge Summary (Addendum)
 Discharge Summary    Patient ID: Kristie Tapia MRN: [id number]; DOB: [birth year]  Admit date: 10/02/2023 Discharge date: 10/04/2023  PCP:  System, Provider Not In   [facility] Providers Cardiologist:  None        Discharge Diagnoses    Principal Problem:   Acute ST elevation myocardial infarction (STEMI) involving other coronary artery of inferior wall (HCC) Active Problems:   Hyperlipidemia LDL goal <70   Coronary artery thrombosis with myocardial infarction [facility])   History of CVA (cerebrovascular accident)  Diagnostic Studies/Procedures    10/03/23  Coronary/Graft Acute MI Revascularization LHC and Coronary angiography   1.  Inferior MI secondary to heavy thrombus in the right PDA and right PLA branches without any significant coronary stenoses 2.  Widely patent left main, LAD, left circumflex, and main RCA branch without significant stenosis 3.  Mild segmental LV dysfunction with hypokinesis of the basal and mid inferior wall 4.  Mildly elevated LVEDP      Recommendations: In this patient with TIMI-3 flow, I elected not to perform aspiration thrombectomy due to risk of embolization.  The patient does not appear to have any coronary lesions outside of the thrombotic appearance of her distal RCA branch vessels.  High suspicion for embolic event.  Will treat with 18 hours of Aggrastat  and DAPT with aspirin  and ticagrelor .  Will check lower extremity venous duplex studies and would consider a transesophageal echo to evaluate for cardiac source of embolism.  If the patient does not have recurrent symptoms or worsening of her ST elevation, I do not know that she would really benefit from a relook catheterization.  Monitor in CV-ICU.   TTE 10/03/23  - EF~50% with mildly decreased function.  Severe hypokinesis of the basal to mid inferior wall.  GR 2 DD.  Unable to assess PAP.  Bubble study insufficient to exclude PFO/ASD normal valves.  Mildly elevated RAP.  (Technically  difficult study)  LE DVT Dopplers 10/03/23 No evidence of LE DVTs bilaterally  TEE 10/04/23 1. Left ventricular ejection fraction, by estimation, is 50 to 55% . The left ventricle has low normal function.  2. Right ventricular systolic function is normal. The right ventricular size is normal.  3. No left atrial/ left atrial appendage thrombus was detected. The LAA emptying velocity was 76 cm/ s.  4. The mitral valve is grossly normal. Trivial mitral valve regurgitation.  5. The aortic valve is tricuspid. Aortic valve regurgitation is not visualized. No aortic stenosis is present.  6. There is Moderate ( Grade III) plaque involving the descending aorta.  7. Evidence of atrial level shunting detected by color flow Doppler. Agitated saline contrast bubble study was positive with shunting observed within 3- 6 cardiac cycles suggestive of interatrial shunt. Grade 2 right to left shunt with agitated saline  History of Present Illness     Kristie Tapia is a 54 y.o. female with T2DM, OSA who is being seen 10/02/2023 for the evaluation of STEMI and chest pain.   Hospital Course     Inferior STEMI  2/2 heavy thrombus in the right PDA and right PLA branches without any significant coronary stenoses  Concern for embolic etiology PFO on TEE   Per LHC, heavy clot burden in the distal RCA rPDA/rPAV bifurcation with otherwise normal coronary arteries. This favors embolic etiology further explained by discovery of PFO on today's TEE. Case was discussed with Dr. Arlester Ladd and there may be benefit to closure and he will see her after hospitalization.  In interim, medical management of plavix , DOAC (in place of aspirin  given concern for emboli), statin. Rfs for VTE include her smoking and diabetes and will collect hypercoagulable labs today. 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 She reported an arm DVT but that is not clear on chart review. Was on Plavix  after for several years but has since stopped, remained on aspirin . MI constitutes a second embolic event in the last several years, complicated by her PFO. - Per above, full anticoagulation with Eliquis  5 BID, hypercoagulable panel collected, op follow-up to address PFO   Dyspnea (Resolved) Noted post-cath and improved as she recovered. She did not have hypoxia or respiratory distress/effort. Possible side effect of initial treatment of Brillinta, which was stopped, and no clinical or evidence to support heart failure or PE.   Type 2 diabetes Sliding scale insulin   - Resume home meds on discharge     Did the patient have an acute coronary syndrome (MI, NSTEMI, STEMI, etc) this admission?:  Yes                               AHA/ACC ACS Clinical Performance & Quality Measures: Aspirin  prescribed? - No - D/c on Plavix  and Eliquis  ADP Receptor Inhibitor (Plavix /Clopidogrel , Brilinta /Ticagrelor  or Effient/Prasugrel) prescribed (includes medically managed patients)? - Yes Beta Blocker prescribed? - No - bradycardia High Intensity Statin (Lipitor 40-80mg  or Crestor  20-40mg ) prescribed? - Yes EF assessed during THIS hospitalization? - Yes For EF <40%, was ACEI/ARB prescribed? - Not Applicable (EF >/= 40%) For EF <40%, Aldosterone Antagonist (Spironolactone or Eplerenone) prescribed? - Not Applicable (EF >/= 40%) Cardiac Rehab Phase II ordered (including medically managed patients)? - Yes   _____________  Discharge Vitals Blood pressure (!) 103/53, pulse 66, temperature 98.1 F (36.7 C), temperature source Temporal, resp. rate 17, height 5\' 5"  (1.651 m), weight 83.5 kg, SpO2 96%.  Filed Weights   10/02/23 2200 10/03/23 0600 10/04/23 0500  Weight: 82.7 kg 82.7 kg 83.5 kg   Physical Exam Constitutional:      General: She is not in acute  distress.    Appearance: She is not ill-appearing.  Cardiovascular:     Rate and Rhythm: Regular rhythm. Bradycardia present.     Heart sounds: Normal heart sounds.     Comments: rate around 58 Pulmonary:     Effort: Pulmonary effort is normal.  Abdominal:     Palpations: Abdomen is soft.     Tenderness: There is no abdominal tenderness.  Musculoskeletal:     Right lower leg: No edema.     Left lower leg: No edema.  Neurological:     General: No focal deficit present.     Mental Status: She is alert and oriented to person, place, and time.  Psychiatric:        Mood and Affect: Mood normal.        Behavior: Behavior normal.    Labs & Radiologic Studies    CBC Recent Labs    10/03/23 0310 10/04/23 0236  WBC 12.2* 11.7*  HGB 13.2 13.5  HCT 41.6 40.9  MCV 91.8 88.9  PLT 310 274   Basic Metabolic Panel Recent Labs    16/10/96 0310 10/04/23 0236  NA 139 137  K 4.6  4.1  CL 108 107  CO2 24 22  GLUCOSE 124* 95  BUN 13 8  CREATININE 1.04* 0.94  CALCIUM  8.1* 8.7*   Liver Function Tests No results for input(s): "AST", "ALT", "ALKPHOS", "BILITOT", "PROT", "ALBUMIN" in the last 72 hours. No results for input(s): "LIPASE", "AMYLASE" in the last 72 hours. High Sensitivity Troponin:   Recent Labs  Lab 10/02/23 1957  TROPONINIHS >24,000*    BNP Invalid input(s): "POCBNP" D-Dimer No results for input(s): "DDIMER" in the last 72 hours. Hemoglobin A1C Recent Labs    10/02/23 2212  HGBA1C 6.6*   Fasting Lipid Panel Recent Labs    10/03/23 0310  CHOL 72  HDL 21*  LDLCALC 8  TRIG 217*  CHOLHDL 3.4   Thyroid Function Tests No results for input(s): "TSH", "T4TOTAL", "T3FREE", "THYROIDAB" in the last 72 hours.  Invalid input(s): "FREET3" _____________  ECHO TEE Result Date: 10/04/2023    TRANSESOPHOGEAL ECHO REPORT   Patient Name:   Kristie Tapia Date of Exam: 10/04/2023 Medical Rec #:  [id number]       Height:       65.0 in Accession #:    [id number]       Weight:       184.1 lb Date of Birth:  [birth year]      BSA:          1.910 m Patient Age:    52 years        BP:           106/74 mmHg Patient Gender: F               HR:           96 bpm. Exam Location:  Inpatient Procedure: Transesophageal Echo, Cardiac Doppler, Color Doppler and Saline            Contrast Bubble Study (Both Spectral and Color Flow Doppler were            utilized during procedure). Indications:     Acute myocardial infarction I21.9  History:         Patient has prior history of Echocardiogram examinations, most                  recent 10/03/2023. Acute MI, Stroke; Risk Factors:Diabetes,                  Dyslipidemia and Current Smoker.  Sonographer:     Terrilee Few RCS Referring Phys:  [id number] ZANE ADAMS Diagnosing Phys: Grady Lawman MD PROCEDURE: After discussion of the risks and benefits of a TEE, an informed consent was obtained from the patient. The transesophogeal probe was passed without difficulty through the esophogus of the patient. Imaged were obtained with the patient in a left lateral decubitus position. Local oropharyngeal anesthetic was provided with Cetacaine. Sedation performed by different physician. The patient was monitored while under deep sedation. Anesthestetic sedation was provided intravenously by Anesthesiology: 328.02mg  of Propofol , 100mg  of Lidocaine . Image quality was good. The patient's vital signs; including heart rate, blood pressure, and oxygen saturation; remained stable throughout the procedure. The patient developed no complications during the procedure.  IMPRESSIONS  1. Left ventricular ejection fraction, by estimation, is 50 to 55%. The left ventricle has low normal function.  2. Right ventricular systolic function is normal. The right ventricular size is normal.  3. No left atrial/left atrial appendage thrombus was detected. The LAA emptying velocity was 76 cm/s.  4. The mitral valve is grossly normal.  Trivial mitral valve regurgitation.  5. The aortic  valve is tricuspid. Aortic valve regurgitation is not visualized. No aortic stenosis is present.  6. There is Moderate (Grade III) plaque involving the descending aorta.  7. Evidence of atrial level shunting detected by color flow Doppler. Agitated saline contrast bubble study was positive with shunting observed within 3-6 cardiac cycles suggestive of interatrial shunt. Grade 2 right to left shunt with agitated saline injection. There is a patent foramen ovale with bidirectional shunting across atrial septum. Conclusion(s)/Recommendation(s): PFO rims appear adequate for closure if indicated. Challenging imaging. FINDINGS  Left Ventricle: Left ventricular ejection fraction, by estimation, is 50 to 55%. The left ventricle has low normal function. The left ventricular internal cavity size was normal in size. Right Ventricle: The right ventricular size is normal. No increase in right ventricular wall thickness. Right ventricular systolic function is normal. Left Atrium: Left atrial size was normal in size. No left atrial/left atrial appendage thrombus was detected. The LAA emptying velocity was 76 cm/s. Right Atrium: Right atrial size was normal in size. Pericardium: Trivial pericardial effusion is present. Mitral Valve: The mitral valve is grossly normal. Trivial mitral valve regurgitation. Tricuspid Valve: The tricuspid valve is normal in structure. Tricuspid valve regurgitation is trivial. No evidence of tricuspid stenosis. Aortic Valve: The aortic valve is tricuspid. Aortic valve regurgitation is not visualized. No aortic stenosis is present. Aortic valve peak gradient measures 5.9 mmHg. Pulmonic Valve: The pulmonic valve was normal in structure. Pulmonic valve regurgitation is trivial. No evidence of pulmonic stenosis. Aorta: The aortic root is normal in size and structure. There is moderate (Grade III) plaque involving the descending aorta. IAS/Shunts: There is redundancy of the interatrial septum. The interatrial  septum appears to be lipomatous. Evidence of atrial level shunting detected by color flow Doppler. Agitated saline contrast was given intravenously to evaluate for intracardiac shunting. Agitated saline contrast bubble study was positive with shunting observed within 3-6 cardiac cycles suggestive of interatrial shunt. Grade 2 right to left shunt with agitated saline injection. Additional Comments: Spectral Doppler performed. LEFT VENTRICLE PLAX 2D LVOT diam:     2.30 cm LV SV:         68 LV SV Index:   35 LVOT Area:     4.15 cm  AORTIC VALVE AV Area (Vmax): 2.19 cm AV Vmax:        121.00 cm/s AV Peak Grad:   5.9 mmHg LVOT Vmax:      63.80 cm/s LVOT Vmean:     43.000 cm/s LVOT VTI:       0.163 m  AORTA Ao Root diam: 3.50 cm Ao Asc diam:  3.20 cm  SHUNTS Systemic VTI:  0.16 m Systemic Diam: 2.30 cm Grady Lawman MD Electronically signed by Grady Lawman MD Signature Date/Time: 10/04/2023/10:48:57 AM    Final (Updated)    EP STUDY Result Date: 10/04/2023 See surgical note for result.  VAS US  LOWER EXTREMITY VENOUS (DVT) Result Date: 10/03/2023  Lower Venous DVT Study Patient Name:  Kristie Tapia  Date of Exam:   10/03/2023 Medical Rec #: [id number]        Accession #:    [id number] Date of Birth: 05-Jan-1971       Patient Gender: F Patient Age:   21 years Exam Location:  [facility] Procedure:      VAS US  LOWER EXTREMITY VENOUS (DVT) Referring Phys: Cornel Diesel NARCISSE JR --------------------------------------------------------------------------------  Indications: Coronary artery embolism.  Risk Factors: Past pregnancy. Comparison Study: None. Performing  Technologist: Estanislao Heimlich  Examination Guidelines: A complete evaluation includes B-mode imaging, spectral Doppler, color Doppler, and power Doppler as needed of all accessible portions of each vessel. Bilateral testing is considered an integral part of a complete examination. Limited examinations for reoccurring indications may be performed as noted. The  reflux portion of the exam is performed with the patient in reverse Trendelenburg.  +---------+---------------+---------+-----------+----------+--------------+ RIGHT    CompressibilityPhasicitySpontaneityPropertiesThrombus Aging +---------+---------------+---------+-----------+----------+--------------+ CFV      Full           Yes      Yes                                 +---------+---------------+---------+-----------+----------+--------------+ SFJ      Full                                                        +---------+---------------+---------+-----------+----------+--------------+ FV Prox  Full                                                        +---------+---------------+---------+-----------+----------+--------------+ FV Mid   Full                                                        +---------+---------------+---------+-----------+----------+--------------+ FV DistalFull                                                        +---------+---------------+---------+-----------+----------+--------------+ PFV      Full                                                        +---------+---------------+---------+-----------+----------+--------------+ POP      Full           Yes      Yes                                 +---------+---------------+---------+-----------+----------+--------------+ PTV      Full                    Yes                                 +---------+---------------+---------+-----------+----------+--------------+ PERO     Full                    Yes                                 +---------+---------------+---------+-----------+----------+--------------+   +---------+---------------+---------+-----------+----------+--------------+  LEFT     CompressibilityPhasicitySpontaneityPropertiesThrombus Aging +---------+---------------+---------+-----------+----------+--------------+ CFV      Full           Yes       Yes                                 +---------+---------------+---------+-----------+----------+--------------+ SFJ      Full                                                        +---------+---------------+---------+-----------+----------+--------------+ FV Prox  Full                                                        +---------+---------------+---------+-----------+----------+--------------+ FV Mid   Full                                                        +---------+---------------+---------+-----------+----------+--------------+ FV DistalFull                                                        +---------+---------------+---------+-----------+----------+--------------+ PFV      Full                                                        +---------+---------------+---------+-----------+----------+--------------+ POP      Full           Yes      Yes                                 +---------+---------------+---------+-----------+----------+--------------+ PTV      Full                    Yes                                 +---------+---------------+---------+-----------+----------+--------------+ PERO     Full                    Yes                                 +---------+---------------+---------+-----------+----------+--------------+ Avascular mass seen behind left knee measuring 1.68 x 1.25 x 3.14 cm.    Summary: RIGHT: - There is no evidence of deep vein thrombosis in the lower extremity.  - No cystic structure found in the popliteal fossa.  LEFT: - There is no evidence of deep vein thrombosis in  the lower extremity.  - A cystic structure is found in the popliteal fossa.  *See table(s) above for measurements and observations. Electronically signed by Runell Countryman on 10/03/2023 at 5:48:17 PM.    Final    ECHOCARDIOGRAM COMPLETE BUBBLE STUDY Result Date: 10/03/2023    ECHOCARDIOGRAM REPORT   Patient Name:   Kristie Tapia Date of Exam:  10/03/2023 Medical Rec #:  [id number]       Height:       65.0 in Accession #:    [id number]      Weight:       182.3 lb Date of Birth:  20-Feb-1971      BSA:          1.902 m Patient Age:    52 years        BP:           100/58 mmHg Patient Gender: F               HR:           60 bpm. Exam Location:  Inpatient Procedure: 2D Echo, Color Doppler, Cardiac Doppler, Saline Contrast Bubble Study            and Intracardiac Opacification Agent (Both Spectral and Color Flow            Doppler were utilized during procedure). Indications:     STEMI  History:         Patient has prior history of Echocardiogram examinations, most                  recent 12/28/2017.  Sonographer:     Hersey Lorenzo RDCS Referring Phys:  [id number] DENNIS NARCISSE JR Diagnosing Phys: Archer Bear IMPRESSIONS  1. Left ventricular ejection fraction, by estimation, is 50%. The left ventricle has mildly decreased function. The left ventricle demonstrates regional wall motion abnormalities with basal to mid inferior severe hypokinesis. Left ventricular diastolic parameters are consistent with Grade II diastolic dysfunction (pseudonormalization).  2. Right ventricular systolic function is normal. The right ventricular size is normal. Tricuspid regurgitation signal is inadequate for assessing PA pressure.  3. Bubble study was difficult but appears negative, no evidence for PFO/ASD.  4. The mitral valve is normal in structure. No evidence of mitral valve regurgitation. No evidence of mitral stenosis.  5. The aortic valve is tricuspid. Aortic valve regurgitation is not visualized. No aortic stenosis is present.  6. The inferior vena cava is dilated in size with >50% respiratory variability, suggesting right atrial pressure of 8 mmHg.  7. Technically difficult study with poor acoustic windows. FINDINGS  Left Ventricle: Left ventricular ejection fraction, by estimation, is 50%. The left ventricle has mildly decreased function. The left ventricle demonstrates  regional wall motion abnormalities. The left ventricular internal cavity size was normal in size. There is no left ventricular hypertrophy. Left ventricular diastolic parameters are consistent with Grade II diastolic dysfunction (pseudonormalization). Right Ventricle: The right ventricular size is normal. No increase in right ventricular wall thickness. Right ventricular systolic function is normal. Tricuspid regurgitation signal is inadequate for assessing PA pressure. Left Atrium: Left atrial size was normal in size. Right Atrium: Right atrial size was normal in size. Pericardium: Trivial pericardial effusion is present. Mitral Valve: The mitral valve is normal in structure. No evidence of mitral valve regurgitation. No evidence of mitral valve stenosis. Tricuspid Valve: The tricuspid valve is not well visualized. Tricuspid valve regurgitation is not demonstrated. Aortic Valve: The aortic valve is tricuspid. Aortic  valve regurgitation is not visualized. No aortic stenosis is present. Pulmonic Valve: The pulmonic valve was normal in structure. Pulmonic valve regurgitation is not visualized. Aorta: The aortic root is normal in size and structure. Venous: The inferior vena cava is dilated in size with greater than 50% respiratory variability, suggesting right atrial pressure of 8 mmHg. IAS/Shunts: Bubble study was difficult but appears negative, no evidence for PFO/ASD.  LEFT VENTRICLE PLAX 2D LVIDd:         4.90 cm     Diastology LVIDs:         3.70 cm     LV e' medial:    5.77 cm/s LV PW:         1.00 cm     LV E/e' medial:  9.4 LV IVS:        1.00 cm     LV e' lateral:   9.25 cm/s LVOT diam:     2.00 cm     LV E/e' lateral: 5.8 LV SV:         53 LV SV Index:   28 LVOT Area:     3.14 cm  LV Volumes (MOD) LV vol d, MOD A2C: 79.5 ml LV vol s, MOD A2C: 35.7 ml LV SV MOD A2C:     43.8 ml RIGHT VENTRICLE            IVC RV S prime:     7.40 cm/s  IVC diam: 2.20 cm LEFT ATRIUM           Index LA diam:      3.90 cm 2.05  cm/m LA Vol (A4C): 33.9 ml 17.82 ml/m  AORTIC VALVE LVOT Vmax:   73.10 cm/s LVOT Vmean:  52.000 cm/s LVOT VTI:    0.170 m  AORTA Ao Root diam: 3.50 cm Ao Asc diam:  3.00 cm MITRAL VALVE MV Area (PHT): 4.17 cm    SHUNTS MV Decel Time: 182 msec    Systemic VTI:  0.17 m MV E velocity: 54.00 cm/s  Systemic Diam: 2.00 cm MV A velocity: 57.80 cm/s MV E/A ratio:  0.93 Dalton McleanMD Electronically signed by Archer Bear Signature Date/Time: 10/03/2023/10:01:38 AM    Final (Updated)    CARDIAC CATHETERIZATION Result Date: 10/02/2023 1.  Inferior MI secondary to heavy thrombus in the right PDA and right PLA branches without any significant coronary stenoses 2.  Widely patent left main, LAD, left circumflex, and main RCA branch without significant stenosis 3.  Mild segmental LV dysfunction with hypokinesis of the basal and mid inferior wall 4.  Mildly elevated LVEDP Recommendations: In this patient with TIMI-3 flow, I elected not to perform aspiration thrombectomy due to risk of embolization.  The patient does not appear to have any coronary lesions outside of the thrombotic appearance of her distal RCA branch vessels.  High suspicion for embolic event.  Will treat with 18 hours of Aggrastat  and DAPT with aspirin  and ticagrelor .  Will check lower extremity venous duplex studies and would consider a transesophageal echo to evaluate for cardiac source of embolism.  If the patient does not have recurrent symptoms or worsening of her ST elevation, I do not know that she would really benefit from a relook catheterization.  Monitor in CV-ICU.    DG Chest Port 1 View Result Date: 10/02/2023 CLINICAL DATA:  Chest pain.  Elevated troponins. EXAM: PORTABLE CHEST 1 VIEW COMPARISON:  12/07/2021 FINDINGS: Heart size and pulmonary vascularity are normal. Lungs are clear. No pleural effusion or  pneumothorax. Mediastinal contours appear intact. IMPRESSION: No active disease. Electronically Signed   By: Boyce Byes M.D.   On:  10/02/2023 20:44   Disposition   Pt is being discharged home today in good condition.  Follow-up Plans & Appointments     Discharge Instructions     AMB referral to Phase II Cardiac Rehabilitation   Complete by: As directed    Diagnosis: STEMI   After initial evaluation and assessments completed: Virtual Based Care may be provided alone or in conjunction with Phase 2 Cardiac Rehab based on patient barriers.: Yes   Intensive Cardiac Rehabilitation (ICR) [facility] location only OR Traditional Cardiac Rehabilitation (TCR) *If criteria for ICR are not met will enroll in TCR [facility] only): Yes   Call MD for:  persistant dizziness or light-headedness   Complete by: As directed    Call MD for:  severe uncontrolled pain   Complete by: As directed    Diet - low sodium heart healthy   Complete by: As directed    Discharge instructions   Complete by: As directed    Return to normal activity slowly. No heavy lifting or strain on the wrist for one week. These new medicines are provided at discharge: Plavix  and Eliquis  - blood thinners to prevent further blood clots. Rosuvastatin  at increased dose for cholesterol to protect your heart. Your follow-up appointment is with Dr Arlester Ladd at the [facility] office on 6/12 at 8:40am.   Increase activity slowly   Complete by: As directed         Discharge Medications   Allergies as of 10/04/2023       Reactions   Chlorpheniramine-pseudoeph Itching   ALL DECONGESTANTS   Sulfa Antibiotics Other (See Comments)   Burns the inside of my mouth; "blisters; leaves tongue and inside of mouth solid red"   Tape Other (See Comments)   Blisters- USE PAPER TAPE ONLY   Tapentadol Other (See Comments)   Blisters- USE PAPER TAPE ONLY   Decongestant [pseudoephedrine Hcl Er] Itching   ALL DECONGESTANTS   Duloxetine  Other (See Comments)   Felt "out of it"   Fremanezumab-vfrm Other (See Comments), Nausea Only   Levofloxacin Other (See Comments), Nausea Only    Tizanidine  Other (See Comments)   headache   Chlorpheniramine Itching   Red Dye #40 (allura Red) Other (See Comments)   CAUSES MIGRAINES   Egg-derived Products Nausea And Vomiting   Latex Itching   Percocet [oxycodone -acetaminophen ] Other (See Comments)   PT reports having nightmares when taking Percocet   Prednisone Other (See Comments)   "Gives me a bad attitude"   Zoloft [sertraline Hcl] Rash        Medication List     TAKE these medications    acetaminophen  500 MG tablet Commonly known as: TYLENOL  Take 1,000 mg by mouth every 6 (six) hours as needed for headache, moderate pain (pain score 4-6) or mild pain (pain score 1-3) (for headaches).   apixaban  5 MG Tabs tablet Commonly known as: ELIQUIS  Take 1 tablet (5 mg total) by mouth 2 (two) times daily.   budesonide-formoterol 160-4.5 MCG/ACT inhaler Commonly known as: SYMBICORT Inhale 2 puffs into the lungs in the morning and at bedtime.   clindamycin  1 % external solution Commonly known as: CLEOCIN  T APPLY THIN LAYER OF LIQUID TO AFFECTED AREA(S) TWICE DAILY   clopidogrel  75 MG tablet Commonly known as: PLAVIX  Take 1 tablet (75 mg total) by mouth daily. Start taking on: Oct 05, 2023  cyanocobalamin 1000 MCG/ML injection Commonly known as: VITAMIN B12 Inject 1,000 mcg into the muscle every Saturday.   diclofenac  75 MG EC tablet Commonly known as: VOLTAREN  Take 75 mg by mouth 2 (two) times daily.   donepezil  10 MG tablet Commonly known as: ARICEPT  Take 10 mg by mouth at bedtime.   escitalopram  20 MG tablet Commonly known as: LEXAPRO  Take 20 mg by mouth at bedtime.   famotidine  40 MG tablet Commonly known as: PEPCID  Take 40 mg by mouth at bedtime.   fluticasone  50 MCG/ACT nasal spray Commonly known as: FLONASE  Place 1-2 sprays into both nostrils daily as needed for allergies or rhinitis. What changed:  how much to take when to take this   FreeStyle Libre 14 Day Sensor Misc 1 each by Other route  every 14 (fourteen) days. Use to monitor glucose levels. Change sensor every 14 days   furosemide  20 MG tablet Commonly known as: LASIX  Take 1 tablet (20 mg total) by mouth daily as needed for fluid or edema (feet or legs). What changed: when to take this   HYDROcodone -acetaminophen  5-325 MG tablet Commonly known as: NORCO/VICODIN Take 1 tablet by mouth every 6 (six) hours as needed for moderate pain or severe pain.   Insulin  Aspart FlexPen 100 UNIT/ML Commonly known as: NOVOLOG  Inject 6-12 Units into the skin in the morning, at noon, and at bedtime.   lamoTRIgine  25 MG tablet Commonly known as: LAMICTAL  Take 25 mg by mouth at bedtime.   LORazepam  1 MG tablet Commonly known as: ATIVAN  TAKE 1 TABLET BY MOUTH TWICE DAILY AS NEEDED FOR ANXIETY What changed:  how much to take how to take this when to take this reasons to take this additional instructions   multivitamin with minerals Tabs tablet Take 1 tablet by mouth daily.   nitroGLYCERIN  0.4 MG SL tablet Commonly known as: NITROSTAT  Place 0.4 mg under the tongue every 5 (five) minutes as needed for chest pain.   ondansetron  4 MG disintegrating tablet Commonly known as: ZOFRAN -ODT Take 4 mg by mouth.   pantoprazole  40 MG tablet Commonly known as: PROTONIX  Take 40 mg by mouth 2 (two) times daily.   pregabalin  100 MG capsule Commonly known as: LYRICA  Take 100-200 mg by mouth See admin instructions. Take 1 tablet (100 mg) by mouth every morning and 2 tablets (200 mg) at night   promethazine  25 MG tablet Commonly known as: PHENERGAN  Take 25 mg by mouth every 6 (six) hours as needed for nausea.   rizatriptan 10 MG tablet Commonly known as: MAXALT Take 10 mg by mouth once as needed for migraine. Max 2 tablets/week   rosuvastatin  40 MG tablet Commonly known as: CRESTOR  Take 1 tablet (40 mg total) by mouth at bedtime. What changed:  medication strength how much to take when to take this   Rybelsus  14 MG  Tabs Generic drug: Semaglutide  Take 14 mg by mouth daily.   spironolactone 50 MG tablet Commonly known as: ALDACTONE Take 1 tablet by mouth at bedtime.   traZODone  100 MG tablet Commonly known as: DESYREL  Take 1.5 tablets (150 mg total) by mouth at bedtime. What changed: how much to take   Tresiba  FlexTouch 100 UNIT/ML FlexTouch Pen Generic drug: insulin  degludec Inject 24 Units into the skin at bedtime.   V-Go 40 Kit 1 Device by Does not apply route daily.   Ventolin  HFA 108 (90 Base) MCG/ACT inhaler Generic drug: albuterol  INHALE 2 PUFFS BY MOUTH EVERY 6 HOURS AS NEEDED FOR WHEEZING FOR  SHORTNESS OF BREATH What changed: See the new instructions.          Outstanding Labs/Studies   Duration of Discharge Encounter: 25 mins   Signed, Carleen Chary, DO IM Resident PGY1 10/04/2023, 12:06 PM   ATTENDING ATTESTATION  I have seen, examined and evaluated the patient this morning on rounds along with the resident physician.  After reviewing all the available data and chart, we discussed the patients laboratory, study & physical findings as well as symptoms in detail.  We also discussed general summary of the patient's hospitalization along with pertinent findings, impressions and recommendations.  I agree with the findings, examination, impression recommendations and hospital summary as noted above.  Attending adjustments noted in italics.   Patient seen and evaluated with resident for TEE showing PFO.  Discussed with Dr. Arlester Ladd.  Plan is to continue with the conversion of antiplatelet/anticoagulant to Plavix  plus DOAC (Eliquis  5 mg twice daily) and plan for close follow-up with Dr. Arlester Ladd to discuss the possibility of PFO closure.  We then will determine what the most appropriate therapy would be going forward.  We will check hypercoagulable panel now and then potentially again in the future to evaluate for any hypercoagulable states.   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   Also discussed smoking cessation counseling. On rosuvastatin  40 mg for lipid control. Blood pressure is well-controlled on no medications.  Based on the lack of evidence of CAD, we will hold off on management. No signs or symptoms of angina or CHF now.  Okay for discharge.  I spent 33 minutes in the care of Kristie Tapia today including reviewing labs (3 min), reviewing studies (10 min reviewing Films, echocardiogram and TEE results.  We reviewed the results with Dr. Arlester Ladd and Dr. Chancy Comber.), face to face time discussing treatment options (15 min), 5 minutes dictating, and documenting in the encounter. Combined MD time between resident and attending 58 minutes    Arleen Lacer, MD, MS Randene Bustard, M.D., M.S. Interventional Chartered certified accountant  Pager # [phone]

## 2023-10-04 NOTE — Telephone Encounter (Signed)
   Transition of Care Follow-up Phone Call Request    Patient Name: Kristie Tapia Date of Birth: 13-Aug-1970 Date of Encounter: 10/04/2023  Primary Care Provider:  System, Provider Not In Primary Cardiologist:  None  [name] has been scheduled for a transition of care follow up appointment with a [facility] provider:  Patient has a visit with Dr. Arlester Ladd to discuss PFO. However, he also needs a TOC hospital follow-up after STEMI. I was not able to arrange this before discharge. Can you please call patient to schedule a follow-up visit within 2 weeks with any APP?  Please reach out to Kristie Tapia within 48 hours of discharge to confirm appointment and review transition of care protocol questionnaire. Anticipated discharge date:  10/04/2023  Casimer Clear, PA-C  10/04/2023, 1:51 PM

## 2023-10-05 ENCOUNTER — Encounter (HOSPITAL_COMMUNITY): Payer: Self-pay | Admitting: Internal Medicine

## 2023-10-05 LAB — PROTEIN C, TOTAL: Protein C, Total: 95 % (ref 60–150)

## 2023-10-06 LAB — PROTEIN C ACTIVITY: Protein C Activity: 106 % (ref 73–180)

## 2023-10-06 LAB — LUPUS ANTICOAGULANT PANEL
DRVVT: 44.6 s (ref 0.0–47.0)
PTT Lupus Anticoagulant: 39.2 s (ref 0.0–43.5)

## 2023-10-06 LAB — BETA-2-GLYCOPROTEIN I ABS, IGG/M/A
Beta-2 Glyco I IgG: <9 GPI IgG units (ref 0–20)
Beta-2-Glycoprotein I IgA: <9 GPI IgA units (ref 0–25)
Beta-2-Glycoprotein I IgM: <9 GPI IgM units (ref 0–32)

## 2023-10-06 LAB — PROTEIN S ACTIVITY: Protein S Activity: 69 % (ref 63–140)

## 2023-10-06 LAB — PROTEIN S, TOTAL: Protein S Ag, Total: 58 % — ABNORMAL LOW (ref 60–150)

## 2023-10-07 LAB — CARDIOLIPIN ANTIBODIES, IGG, IGM, IGA
Anticardiolipin IgA: <9 U/mL (ref 0–11)
Anticardiolipin IgG: <9 GPL U/mL (ref 0–14)
Anticardiolipin IgM: <9 [MPL'U]/mL (ref 0–12)

## 2023-10-07 LAB — HOMOCYSTEINE: Homocysteine: 14.6 umol/L — ABNORMAL HIGH (ref 0.0–14.5)

## 2023-10-07 NOTE — Anesthesia Postprocedure Evaluation (Signed)
 Anesthesia Post Note  Patient: Kristie Tapia  Procedure(s) Performed: TRANSESOPHAGEAL ECHOCARDIOGRAM     Patient location during evaluation: Cath Lab Anesthesia Type: MAC Level of consciousness: awake Pain management: pain level controlled Vital Signs Assessment: post-procedure vital signs reviewed and stable Respiratory status: spontaneous breathing, nonlabored ventilation and respiratory function stable Cardiovascular status: blood pressure returned to baseline and stable Postop Assessment: no apparent nausea or vomiting Anesthetic complications: no   No notable events documented.  Last Vitals:  Vitals:   10/04/23 0920 10/04/23 1211  BP:    Pulse: 66   Resp: 17   Temp:  36.6 C  SpO2: 96%     Last Pain:  Vitals:   10/04/23 1211  TempSrc: Oral  PainSc:                  Tommie Frame Avion Kutzer

## 2023-10-08 NOTE — Telephone Encounter (Signed)
 Left message to call back

## 2023-10-09 MED ORDER — FUROSEMIDE 20 MG PO TABS: ORAL_TABLET | ORAL | 0 refills | Status: DC

## 2023-10-09 NOTE — Addendum Note (Signed)
 Addended by: Keller Patella on: 10/09/2023 03:32 PM   Modules accepted: Orders

## 2023-10-09 NOTE — Telephone Encounter (Signed)
 Patient contacted regarding discharge from [facility] on 10/04/23  Patient understands to follow up with provider Katelyn West, NP on 10/25/23 at 8:50am at [address] [facility] Patient understands discharge instructions? Yes Patient understands medications and regiment? YES Patient understands to bring all medications to this visit? YES  Ask patient:  Are you enrolled in My Chart YES If no ask patient if they would like to enroll.   For patients Patient requesting refill on her Furosemide  20mg . Per discharge instructions, she is supposed to take as needed for edema and she reports she is out. Rx sent to pharmacy at this time for 30 tablets only.

## 2023-10-10 LAB — FACTOR 5 LEIDEN

## 2023-10-10 LAB — PROTHROMBIN GENE MUTATION

## 2023-10-23 NOTE — Progress Notes (Signed)
 Cardiology Office Note    Date:  10/25/2023  ID:  Kristie Tapia, DOB [birth year], MRN [id number] PCP:  System, Provider Not In  Cardiologist:  Randene Bustard, MD  Electrophysiologist:  None   Chief Complaint: Follow up for CAD   History of Present Illness: .    Kristie Tapia is a 53 y.o. female with visit-pertinent history of inferior STEMI, PFO, history of embolic stroke in 2019, type 2 diabetes mellitus.  On/30/25 patient presented to the ED with reports of chest pain earlier in the day, patient reported that she had woken up feeling like she had severe reflux and chest pressure.  She had nausea and an episode of vomiting.  Patient's husband took her to urgent care where ECG was unremarkable labs were drawn including a troponin of 40,000, she was directed to the emergency department.  Repeat ECG showed inferior ST elevation.  LHC indicated to heavy clot burden in the distal RCA RPDA/RPA V bifurcation, otherwise coronary arteries were normal.  It was felt that this was embolized in nature, as such decision was made to treat medically with Aggrastat  and DAPT with aspirin /ticagrelor .  Echocardiogram indicated LVEF of 50%, wall motion abnormalities were noted with basal to mid inferior severe hypokinesis, G2 DD, RV systolic function and size was normal, without good windows bubble study was difficult to interpret, no significant valvular abnormalities.  Lower extremity ultrasound showed no evidence of DVT.  Echo TEE on 10/04/2023 indicated LVEF of 50 to 55%, there was evidence of atrial level shunting detected by color-flow Doppler, was felt that PFO rim appeared adequate for closure.  During hospitalization patient reported increased dyspnea, she was transition from ticagrelor  to clopidogrel .  Patient was referred to Dr. Arlester Ladd for possible closure of PFO, patient was discharged on Plavix , Eliquis  5 mg twice daily and statin.  Today she presents for follow-up.  She reports that she has  been doing overall well. Her initial complaint is regarding increased lower extremity edema, patient notes that she has been on Lasix  prescribed per PCP since even prior to her NSTEMI.  Patient reports that her legs become increasingly "puffy" as the day progresses and improves overnight, this typically worse in the left leg.  On further discussion patient notes that she had a recent injury to the left knee and broke her kneecap, was recently in a brace.  Patient notes occasional left-sided chest discomfort that quickly resolves, denies any discomfort with exertion, denies use of nitroglycerin . She denies worsening shortness of breath, however she does note that she was diagnosed with COPD stage III the day after she was discharged from the hospital, she is [facility]  She reports dizziness with turning, notes that she has a significant history of vertigo.  Patient denies any bleeding problems on Eliquis  and Plavix . ROS: .   Today she denies fatigue, palpitations, melena, hematuria, hemoptysis, diaphoresis, weakness, presyncope, syncope, orthopnea, and PND.  All other systems are reviewed and otherwise negative. Studies Reviewed: Aaron Aas   EKG:  EKG is ordered today, personally reviewed, demonstrating  EKG Interpretation Date/Time:  Friday Oct 25 2023 09:05:09 EDT Ventricular Rate:  59 PR Interval:  200 QRS Duration:  76 QT Interval:  446 QTC Calculation: 441 R Axis:   11  Text Interpretation: Sinus bradycardia Low voltage QRS T wave abnormality, consider inferior ischemia When compared with ECG of 04-Oct-2023 07:17, No significant change was found Confirmed by Raenette Sakata [phone]) on 10/25/2023 7:12:07 PM  CV Studies: Cardiac studies reviewed are outlined and summarized above. Otherwise please see EMR for full report. Cardiac Studies & Procedures   ______________________________________________________________________________________________ CARDIAC  CATHETERIZATION  CARDIAC CATHETERIZATION 10/02/2023  Conclusion 1.  Inferior MI secondary to heavy thrombus in the right PDA and right PLA branches without any significant coronary stenoses 2.  Widely patent left main, LAD, left circumflex, and main RCA branch without significant stenosis 3.  Mild segmental LV dysfunction with hypokinesis of the basal and mid inferior wall 4.  Mildly elevated LVEDP  Recommendations: In this patient with TIMI-3 flow, I elected not to perform aspiration thrombectomy due to risk of embolization.  The patient does not appear to have any coronary lesions outside of the thrombotic appearance of her distal RCA branch vessels.  High suspicion for embolic event.  Will treat with 18 hours of Aggrastat  and DAPT with aspirin  and ticagrelor .  Will check lower extremity venous duplex studies and would consider a transesophageal echo to evaluate for cardiac source of embolism.  If the patient does not have recurrent symptoms or worsening of her ST elevation, I do not know that she would really benefit from a relook catheterization.  Monitor in CV-ICU.  Findings Coronary Findings Diagnostic  Dominance: Right  Left Main Vessel is angiographically normal.  Left Anterior Descending The vessel exhibits minimal luminal irregularities.  Left Circumflex Vessel is angiographically normal.  Right Coronary Artery The vessel exhibits minimal luminal irregularities. Dist RCA lesion is 50% stenosed with 50% stenosed side branch in RPDA. The lesion is heavily thrombotic.  Right Posterior Atrioventricular Artery RPAV lesion is 50% stenosed. The lesion is heavily thrombotic.  Intervention  No interventions have been documented.   STRESS TESTS  MYOCARDIAL PERFUSION IMAGING 03/08/2017   ECHOCARDIOGRAM  ECHOCARDIOGRAM COMPLETE BUBBLE STUDY 10/03/2023  Narrative ECHOCARDIOGRAM REPORT    Patient Name:   Kristie Tapia Date of Exam: 10/03/2023 Medical Rec #:  [id number]        Height:       65.0 in Accession #:    [id number]      Weight:       182.3 lb Date of Birth:  08-Nov-1970      BSA:          1.902 m Patient Age:    52 years        BP:           100/58 mmHg Patient Gender: F               HR:           60 bpm. Exam Location:  Inpatient  Procedure: 2D Echo, Color Doppler, Cardiac Doppler, Saline Contrast Bubble Study and Intracardiac Opacification Agent (Both Spectral and Color Flow Doppler were utilized during procedure).  Indications:     STEMI  History:         Patient has prior history of Echocardiogram examinations, most recent 12/28/2017.  Sonographer:     Hersey Lorenzo RDCS Referring Phys:  [id number] DENNIS NARCISSE JR Diagnosing Phys: Archer Bear  IMPRESSIONS   1. Left ventricular ejection fraction, by estimation, is 50%. The left ventricle has mildly decreased function. The left ventricle demonstrates regional wall motion abnormalities with basal to mid inferior severe hypokinesis. Left ventricular diastolic parameters are consistent with Grade II diastolic dysfunction (pseudonormalization). 2. Right ventricular systolic function is normal. The right ventricular size is normal. Tricuspid regurgitation signal is inadequate for assessing PA pressure. 3. Bubble study was difficult but appears negative, no evidence for  PFO/ASD. 4. The mitral valve is normal in structure. No evidence of mitral valve regurgitation. No evidence of mitral stenosis. 5. The aortic valve is tricuspid. Aortic valve regurgitation is not visualized. No aortic stenosis is present. 6. The inferior vena cava is dilated in size with >50% respiratory variability, suggesting right atrial pressure of 8 mmHg. 7. Technically difficult study with poor acoustic windows.  FINDINGS Left Ventricle: Left ventricular ejection fraction, by estimation, is 50%. The left ventricle has mildly decreased function. The left ventricle demonstrates regional wall motion abnormalities. The left  ventricular internal cavity size was normal in size. There is no left ventricular hypertrophy. Left ventricular diastolic parameters are consistent with Grade II diastolic dysfunction (pseudonormalization).  Right Ventricle: The right ventricular size is normal. No increase in right ventricular wall thickness. Right ventricular systolic function is normal. Tricuspid regurgitation signal is inadequate for assessing PA pressure.  Left [facility]: Left atrial size was normal in size.  Right [facility]: Right atrial size was normal in size.  Pericardium: Trivial pericardial effusion is present.  Mitral Valve: The mitral valve is normal in structure. No evidence of mitral valve regurgitation. No evidence of mitral valve stenosis.  Tricuspid Valve: The tricuspid valve is not well visualized. Tricuspid valve regurgitation is not demonstrated.  Aortic Valve: The aortic valve is tricuspid. Aortic valve regurgitation is not visualized. No aortic stenosis is present.  Pulmonic Valve: The pulmonic valve was normal in structure. Pulmonic valve regurgitation is not visualized.  Aorta: The aortic root is normal in size and structure.  Venous: The inferior vena cava is dilated in size with greater than 50% respiratory variability, suggesting right atrial pressure of 8 mmHg.  IAS/Shunts: Bubble study was difficult but appears negative, no evidence for PFO/ASD.   LEFT VENTRICLE PLAX 2D LVIDd:         4.90 cm     Diastology LVIDs:         3.70 cm     LV e' medial:    5.77 cm/s LV PW:         1.00 cm     LV E/e' medial:  9.4 LV IVS:        1.00 cm     LV e' lateral:   9.25 cm/s LVOT diam:     2.00 cm     LV E/e' lateral: 5.8 LV SV:         53 LV SV Index:   28 LVOT Area:     3.14 cm  LV Volumes (MOD) LV vol d, MOD A2C: 79.5 ml LV vol s, MOD A2C: 35.7 ml LV SV MOD A2C:     43.8 ml  RIGHT VENTRICLE            IVC RV S prime:     7.40 cm/s  IVC diam: 2.20 cm  LEFT ATRIUM           Index LA diam:       3.90 cm 2.05 cm/m LA Vol (A4C): 33.9 ml 17.82 ml/m AORTIC VALVE LVOT Vmax:   73.10 cm/s LVOT Vmean:  52.000 cm/s LVOT VTI:    0.170 m  AORTA Ao Root diam: 3.50 cm Ao Asc diam:  3.00 cm  MITRAL VALVE MV Area (PHT): 4.17 cm    SHUNTS MV Decel Time: 182 msec    Systemic VTI:  0.17 m MV E velocity: 54.00 cm/s  Systemic Diam: 2.00 cm MV A velocity: 57.80 cm/s MV E/A ratio:  0.93  [employer]  signed by Archer Bear Signature Date/Time: 10/03/2023/10:01:38 AM    Final (Updated)   TEE  ECHO TEE 10/04/2023  Narrative TRANSESOPHOGEAL ECHO REPORT    Patient Name:   JAKAYLA SCHWEPPE Date of Exam: 10/04/2023 Medical Rec #:  [id number]       Height:       65.0 in Accession #:    [id number]      Weight:       184.1 lb Date of Birth:  13-Sep-1970      BSA:          1.910 m Patient Age:    52 years        BP:           106/74 mmHg Patient Gender: F               HR:           96 bpm. Exam Location:  Inpatient  Procedure: Transesophageal Echo, Cardiac Doppler, Color Doppler and Saline Contrast Bubble Study (Both Spectral and Color Flow Doppler were utilized during procedure).  Indications:     Acute myocardial infarction I21.9  History:         Patient has prior history of Echocardiogram examinations, most recent 10/03/2023. Acute MI, Stroke; Risk Factors:Diabetes, Dyslipidemia and Current Smoker.  Sonographer:     Terrilee Few RCS Referring Phys:  [id number] ZANE ADAMS Diagnosing Phys: Grady Lawman MD  PROCEDURE: After discussion of the risks and benefits of a TEE, an informed consent was obtained from the patient. The transesophogeal probe was passed without difficulty through the esophogus of the patient. Imaged were obtained with the patient in a left lateral decubitus position. Local oropharyngeal anesthetic was provided with Cetacaine . Sedation performed by different physician. The patient was monitored while under deep sedation. Anesthestetic sedation  was provided intravenously by Anesthesiology: 328.02mg  of Propofol , 100mg  of Lidocaine . Image quality was good. The patient's vital signs; including heart rate, blood pressure, and oxygen saturation; remained stable throughout the procedure. The patient developed no complications during the procedure.  IMPRESSIONS   1. Left ventricular ejection fraction, by estimation, is 50 to 55%. The left ventricle has low normal function. 2. Right ventricular systolic function is normal. The right ventricular size is normal. 3. No left atrial/left atrial appendage thrombus was detected. The LAA emptying velocity was 76 cm/s. 4. The mitral valve is grossly normal. Trivial mitral valve regurgitation. 5. The aortic valve is tricuspid. Aortic valve regurgitation is not visualized. No aortic stenosis is present. 6. There is Moderate (Grade III) plaque involving the descending aorta. 7. Evidence of atrial level shunting detected by color flow Doppler. Agitated saline contrast bubble study was positive with shunting observed within 3-6 cardiac cycles suggestive of interatrial shunt. Grade 2 right to left shunt with agitated saline injection. There is a patent foramen ovale with bidirectional shunting across atrial septum.  Conclusion(s)/Recommendation(s): PFO rims appear adequate for closure if indicated. Challenging imaging.  FINDINGS Left Ventricle: Left ventricular ejection fraction, by estimation, is 50 to 55%. The left ventricle has low normal function. The left ventricular internal cavity size was normal in size.  Right Ventricle: The right ventricular size is normal. No increase in right ventricular wall thickness. Right ventricular systolic function is normal.  Left [facility]: Left atrial size was normal in size. No left atrial/left atrial appendage thrombus was detected. The LAA emptying velocity was 76 cm/s.  Right [facility]: Right atrial size was normal in size.  Pericardium: Trivial pericardial effusion  is present.  Mitral Valve: The mitral valve is grossly normal. Trivial mitral valve regurgitation.  Tricuspid Valve: The tricuspid valve is normal in structure. Tricuspid valve regurgitation is trivial. No evidence of tricuspid stenosis.  Aortic Valve: The aortic valve is tricuspid. Aortic valve regurgitation is not visualized. No aortic stenosis is present. Aortic valve peak gradient measures 5.9 mmHg.  Pulmonic Valve: The pulmonic valve was normal in structure. Pulmonic valve regurgitation is trivial. No evidence of pulmonic stenosis.  Aorta: The aortic root is normal in size and structure. There is moderate (Grade III) plaque involving the descending aorta.  IAS/Shunts: There is redundancy of the interatrial septum. The interatrial septum appears to be lipomatous. Evidence of atrial level shunting detected by color flow Doppler. Agitated saline contrast was given intravenously to evaluate for intracardiac shunting. Agitated saline contrast bubble study was positive with shunting observed within 3-6 cardiac cycles suggestive of interatrial shunt. Grade 2 right to left shunt with agitated saline injection.  Additional Comments: Spectral Doppler performed.  LEFT VENTRICLE PLAX 2D LVOT diam:     2.30 cm LV SV:         68 LV SV Index:   35 LVOT Area:     4.15 cm   AORTIC VALVE AV Area (Vmax): 2.19 cm AV Vmax:        121.00 cm/s AV Peak Grad:   5.9 mmHg LVOT Vmax:      63.80 cm/s LVOT Vmean:     43.000 cm/s LVOT VTI:       0.163 m  AORTA Ao Root diam: 3.50 cm Ao Asc diam:  3.20 cm   SHUNTS Systemic VTI:  0.16 m Systemic Diam: 2.30 cm  Grady Lawman MD Electronically signed by Grady Lawman MD Signature Date/Time: 10/04/2023/10:48:57 AM    Final (Updated)        ______________________________________________________________________________________________       Current Reported Medications:.    Current Meds  Medication Sig   acetaminophen  (TYLENOL ) 500 MG  tablet Take 1,000 mg by mouth every 6 (six) hours as needed for headache, moderate pain (pain score 4-6) or mild pain (pain score 1-3) (for headaches).   budesonide-formoterol (SYMBICORT) 160-4.5 MCG/ACT inhaler Inhale 2 puffs into the lungs in the morning and at bedtime.   budesonide-glycopyrrolate -formoterol (BREZTRI AEROSPHERE) 160-9-4.8 MCG/ACT AERO inhaler Inhale 2 puffs into the lungs 2 (two) times daily.   clindamycin  (CLEOCIN  T) 1 % external solution    Continuous Blood Gluc Sensor (FREESTYLE LIBRE 14 DAY SENSOR) MISC 1 each by Other route every 14 (fourteen) days. Use to monitor glucose levels. Change sensor every 14 days   cyanocobalamin (,VITAMIN B-12,) 1000 MCG/ML injection Inject 1,000 mcg into the muscle every Saturday.   diclofenac  (VOLTAREN ) 75 MG EC tablet Take 75 mg by mouth 2 (two) times daily.   donepezil  (ARICEPT ) 10 MG tablet Take 10 mg by mouth at bedtime.   escitalopram  (LEXAPRO ) 20 MG tablet Take 20 mg by mouth at bedtime.   famotidine  (PEPCID ) 40 MG tablet Take 40 mg by mouth at bedtime.   fluticasone  (FLONASE ) 50 MCG/ACT nasal spray Place 1-2 sprays into both nostrils daily as needed for allergies or rhinitis. (Patient taking differently: Place 2 sprays into both nostrils daily.)   Insulin  Aspart FlexPen (NOVOLOG ) 100 UNIT/ML Inject 6-12 Units into the skin in the morning, at noon, and at bedtime.   Insulin  Disposable Pump (V-GO 40) KIT 1 Device by Does not apply route daily.   lamoTRIgine  (LAMICTAL ) 25 MG tablet Take 25 mg by mouth  at bedtime.   LORazepam  (ATIVAN ) 1 MG tablet TAKE 1 TABLET BY MOUTH TWICE DAILY AS NEEDED FOR ANXIETY (Patient taking differently: Take 1-2 mg by mouth daily as needed for anxiety.)   Multiple Vitamin (MULTIVITAMIN WITH MINERALS) TABS tablet Take 1 tablet by mouth daily.   nicotine  (NICODERM CQ  - DOSED IN MG/24 HOURS) 21 mg/24hr patch Place 1 patch onto the skin daily.   pantoprazole  (PROTONIX ) 40 MG tablet Take 40 mg by mouth 2 (two) times  daily.    pregabalin  (LYRICA ) 100 MG capsule Take 100-200 mg by mouth See admin instructions. Take 1 tablet (100 mg) by mouth every morning and 2 tablets (200 mg) at night   promethazine  (PHENERGAN ) 25 MG tablet Take 25 mg by mouth every 6 (six) hours as needed for nausea.    rizatriptan (MAXALT) 10 MG tablet Take 10 mg by mouth once as needed for migraine. Max 2 tablets/week   RYBELSUS  14 MG TABS Take 14 mg by mouth daily.   spironolactone (ALDACTONE) 50 MG tablet Take 1 tablet by mouth at bedtime.   traZODone  (DESYREL ) 100 MG tablet Take 1.5 tablets (150 mg total) by mouth at bedtime. (Patient taking differently: Take 200 mg by mouth at bedtime.)   TRESIBA  FLEXTOUCH 100 UNIT/ML FlexTouch Pen Inject 24 Units into the skin at bedtime.   VENTOLIN  HFA 108 (90 Base) MCG/ACT inhaler INHALE 2 PUFFS BY MOUTH EVERY 6 HOURS AS NEEDED FOR WHEEZING FOR SHORTNESS OF BREATH (Patient taking differently: Inhale 2 puffs into the lungs every 6 (six) hours as needed for wheezing or shortness of breath.)   [DISCONTINUED] apixaban  (ELIQUIS ) 5 MG TABS tablet Take 1 tablet (5 mg total) by mouth 2 (two) times daily.   [DISCONTINUED] clopidogrel  (PLAVIX ) 75 MG tablet Take 1 tablet (75 mg total) by mouth daily.   [DISCONTINUED] rosuvastatin  (CRESTOR ) 40 MG tablet Take 1 tablet (40 mg total) by mouth at bedtime.    Physical Exam:    VS:  BP (!) 96/54 (BP Location: Left Arm, Patient Position: Sitting, Cuff Size: Normal)   Pulse (!) 59   Ht 5' 4.5" (1.638 m)   Wt 188 lb (85.3 kg)   BMI 31.77 kg/m    Wt Readings from Last 3 Encounters:  10/25/23 188 lb (85.3 kg)  10/04/23 184 lb 1.4 oz (83.5 kg)  04/30/22 203 lb 0.7 oz (92.1 kg)    GEN: Well nourished, well developed in no acute distress NECK: No JVD; No carotid bruits CARDIAC: RRR, no murmurs, rubs, gallops RESPIRATORY:  Clear to auscultation without rales, wheezing or rhonchi  ABDOMEN: Soft, non-tender, non-distended EXTREMITIES:  No edema; No acute deformity      Asessement and Plan:.    Inferior STEMI/PFO: LHC on 10/03/2023 indicated heavy clot burden in the distal RCA RPDA/RPA V bifurcation with otherwise normal coronary arteries.  It was felt this was embolic in nature, TEE indicated PFO.  Patient has been referred to Dr. Arlester Ladd for consideration of closure.  Patient was started on Plavix  75 mg daily, Eliquis  5 mg twice daily. Today she reports that she is been doing well, after discharge noted some occasional chest discomfort quickly resolved resolved, denies any use of nitroglycerin .  Right radial cath site is clean and intact without evidence of hematoma. Patient denies any bleeding problems on Eliquis .  She notes lab work [facility]. Reviewed ED precautions.  Refills provided of Eliquis , Plavix , Lasix , and rosuvastatin .  History of embolus stroke: Patient with embolic stroke in 2019,  cryptogenic source.  Hyperlipidemia: Lipid profile on 10/03/2023 indicated total cholesterol 72, HDL 21, triglycerides 217 and calculated LDL 8.  Patient reports that her rosuvastatin  was increased to 40 mg daily while inpatient.  Check fasting lipid profile and LFTs in 8 weeks.  Tobacco use: Patient reports her last cigarette was Tuesday morning, using nicotine  patches. Encouraged to continue.   Type 2 DM: Last hemoglobin A1C 6.6 on 10/02/23. Monitored and managed per PCP.   COPD: Patient reports that she was recently diagnosed with COPD stage III, followed [facility].    Disposition: F/u with Dr. Arlester Ladd as in 11/2023 as scheduled.   Signed, Kristain Hu D Akshara Blumenthal, NP

## 2023-10-25 ENCOUNTER — Encounter: Payer: Self-pay | Admitting: Cardiology

## 2023-10-25 ENCOUNTER — Telehealth: Payer: Self-pay | Admitting: Cardiology

## 2023-10-25 ENCOUNTER — Ambulatory Visit: Attending: Cardiology | Admitting: Cardiology

## 2023-10-25 VITALS — BP 96/54 | HR 59 | Ht 64.5 in | Wt 188.0 lb

## 2023-10-25 DIAGNOSIS — Z8673 Personal history of transient ischemic attack (TIA), and cerebral infarction without residual deficits: Secondary | ICD-10-CM

## 2023-10-25 DIAGNOSIS — Q2112 Patent foramen ovale: Secondary | ICD-10-CM

## 2023-10-25 DIAGNOSIS — Z720 Tobacco use: Secondary | ICD-10-CM

## 2023-10-25 DIAGNOSIS — J449 Chronic obstructive pulmonary disease, unspecified: Secondary | ICD-10-CM

## 2023-10-25 DIAGNOSIS — E785 Hyperlipidemia, unspecified: Secondary | ICD-10-CM

## 2023-10-25 DIAGNOSIS — E1142 Type 2 diabetes mellitus with diabetic polyneuropathy: Secondary | ICD-10-CM

## 2023-10-25 DIAGNOSIS — I213 ST elevation (STEMI) myocardial infarction of unspecified site: Secondary | ICD-10-CM | POA: Diagnosis not present

## 2023-10-25 DIAGNOSIS — Z794 Long term (current) use of insulin: Secondary | ICD-10-CM

## 2023-10-25 MED ORDER — APIXABAN 5 MG PO TABS: 5.0000 mg | ORAL_TABLET | Freq: Two times a day (BID) | ORAL | 3 refills | Status: DC

## 2023-10-25 MED ORDER — FUROSEMIDE 20 MG PO TABS: ORAL_TABLET | ORAL | 2 refills | Status: DC

## 2023-10-25 MED ORDER — ROSUVASTATIN CALCIUM 40 MG PO TABS: 40.0000 mg | ORAL_TABLET | Freq: Every day | ORAL | 3 refills | Status: DC

## 2023-10-25 MED ORDER — CLOPIDOGREL BISULFATE 75 MG PO TABS: 75.0000 mg | ORAL_TABLET | Freq: Every day | ORAL | 3 refills | Status: AC

## 2023-10-25 NOTE — Patient Instructions (Addendum)
 Medication Instructions:  No changes *If you need a refill on your cardiac medications before your next appointment, please call your pharmacy*  Lab Work: In 2 months we are going to have you get drawn a fasting lipid panel and LFT's If you have labs (blood work) drawn today and your tests are completely normal, you will receive your results only by: MyChart Message (if you have MyChart) OR A paper copy in the mail If you have any lab test that is abnormal or we need to change your treatment, we will call you to review the results.  Testing/Procedures: No testing  Follow-Up: At [facility], you and your health needs are our priority.  As part of our continuing mission to provide you with exceptional heart care, our providers are all part of one team.  This team includes your primary Cardiologist (physician) and Advanced Practice Providers or APPs (Physician Assistants and Nurse Practitioners) who all work together to provide you with the care you need, when you need it.  Your next appointment:   4 month(s)  Provider:   Randene Bustard MD or Katlyn West NP   We recommend signing up for the patient portal called "MyChart".  Sign up information is provided on this After Visit Summary.  MyChart is used to connect with patients for Virtual Visits (Telemedicine).  Patients are able to view lab/test results, encounter notes, upcoming appointments, etc.  Non-urgent messages can be sent to your provider as well.   To learn more about what you can do with MyChart, go to [email or web address].

## 2023-10-25 NOTE — Telephone Encounter (Signed)
 Pt c/o medication issue:  1. Name of Medication:   apixaban  (ELIQUIS ) 5 MG TABS tablet  clopidogrel  (PLAVIX ) 75 MG tablet   2. How are you currently taking this medication (dosage and times per day)?   3. Are you having a reaction (difficulty breathing--STAT)?   4. What is your medication issue?   Caller Kristie Tapia) wants a call back to confirm patient should be taking both of these medications.

## 2023-10-25 NOTE — Telephone Encounter (Signed)
 Spoke with Curry Dove and confirmed per provider OV note from today patient his to take eliquis  and plavix 

## 2023-11-14 ENCOUNTER — Encounter: Payer: Self-pay | Admitting: Cardiovascular Disease

## 2023-11-14 ENCOUNTER — Ambulatory Visit: Attending: Cardiovascular Disease | Admitting: Cardiovascular Disease

## 2023-11-14 VITALS — BP 104/70 | HR 67 | Ht 64.0 in | Wt 188.0 lb

## 2023-11-14 DIAGNOSIS — I25119 Atherosclerotic heart disease of native coronary artery with unspecified angina pectoris: Secondary | ICD-10-CM

## 2023-11-14 DIAGNOSIS — Z720 Tobacco use: Secondary | ICD-10-CM

## 2023-11-14 DIAGNOSIS — R42 Dizziness and giddiness: Secondary | ICD-10-CM

## 2023-11-14 DIAGNOSIS — Q2112 Patent foramen ovale: Secondary | ICD-10-CM | POA: Diagnosis not present

## 2023-11-14 MED ORDER — ASPIRIN 81 MG PO CAPS: 81.0000 mg | ORAL_CAPSULE | Freq: Every day | ORAL | 3 refills | Status: AC

## 2023-11-14 NOTE — Assessment & Plan Note (Signed)
 Applauded for her efforts at tobacco cessation.  Emphasized that this is the most important modifiable risk factor for prevention of further cardiovascular ischemic events.

## 2023-11-14 NOTE — Progress Notes (Signed)
 Cardiology Office Note:    Date:  11/14/2023   ID:  Kristie Tapia, DOB [birth year], MRN [id number]  PCP:  System, Provider Not In   [facility] Providers Cardiologist:  Arnoldo Lapping, MD     Referring MD: No ref. provider found   Chief Complaint  Patient presents with   Follow-up    PFO    History of Present Illness:    Kristie Tapia is a 53 y.o. female presenting for evaluation of PFO.  Patient is known to me after she presented in May 2025 with an inferior STEMI with heavy distal clot burden in the RCA.  As she was chest pain-free at the time, she was treated with anticoagulant/antiplatelet therapy without intervention.  She had no further complication.  She had a negative DVT study of the legs.  TEE was done and demonstrated a PFO.  There were no other significant abnormalities on her imaging studies.  Hypercoagulable panel was completely normal.  The patient is here with her husband today.  She has quit smoking completely now for 3 weeks.  She denies any exertional chest pain or pressure.  She denies shortness of breath, leg swelling, orthopnea, or heart palpitations.  She complains of some dizziness and gait unsteadiness.  She has followed regularly with neurology and had a recent brain MRI which I reviewed today.  It showed old infarcts but no acute findings.  She denies any bleeding problems on a combination of apixaban  and clopidogrel .   Current Medications: Current Meds  Medication Sig   acetaminophen  (TYLENOL ) 500 MG tablet Take 1,000 mg by mouth every 6 (six) hours as needed for headache, moderate pain (pain score 4-6) or mild pain (pain score 1-3) (for headaches).   apixaban  (ELIQUIS ) 5 MG TABS tablet Take 1 tablet (5 mg total) by mouth 2 (two) times daily.   budesonide-formoterol (SYMBICORT) 160-4.5 MCG/ACT inhaler Inhale 2 puffs into the lungs in the morning and at bedtime.   budesonide-glycopyrrolate -formoterol (BREZTRI AEROSPHERE) 160-9-4.8  MCG/ACT AERO inhaler Inhale 2 puffs into the lungs 2 (two) times daily.   clopidogrel  (PLAVIX ) 75 MG tablet Take 1 tablet (75 mg total) by mouth daily.   Continuous Blood Gluc Sensor (FREESTYLE LIBRE 14 DAY SENSOR) MISC 1 each by Other route every 14 (fourteen) days. Use to monitor glucose levels. Change sensor every 14 days   diclofenac  (VOLTAREN ) 75 MG EC tablet Take 75 mg by mouth 2 (two) times daily.   donepezil  (ARICEPT ) 10 MG tablet Take 10 mg by mouth at bedtime.   escitalopram  (LEXAPRO ) 20 MG tablet Take 20 mg by mouth at bedtime.   famotidine  (PEPCID ) 40 MG tablet Take 40 mg by mouth at bedtime.   fluticasone  (FLONASE ) 50 MCG/ACT nasal spray Place 1-2 sprays into both nostrils daily as needed for allergies or rhinitis. (Patient taking differently: Place 2 sprays into both nostrils daily.)   furosemide  (LASIX ) 20 MG tablet Take 1 tablet by mouth once daily only as needed for swelling   Insulin  Aspart FlexPen (NOVOLOG ) 100 UNIT/ML Inject 6-12 Units into the skin in the morning, at noon, and at bedtime.   lamoTRIgine  (LAMICTAL ) 25 MG tablet Take 25 mg by mouth at bedtime.   LORazepam  (ATIVAN ) 1 MG tablet TAKE 1 TABLET BY MOUTH TWICE DAILY AS NEEDED FOR ANXIETY (Patient taking differently: Take 1-2 mg by mouth daily as needed for anxiety.)   Multiple Vitamin (MULTIVITAMIN WITH MINERALS) TABS tablet Take 1 tablet by mouth daily.   pantoprazole  (PROTONIX ) 40  MG tablet Take 40 mg by mouth 2 (two) times daily.    pregabalin  (LYRICA ) 100 MG capsule Take 100-200 mg by mouth See admin instructions. Take 1 tablet (100 mg) by mouth every morning and 2 tablets (200 mg) at night   promethazine  (PHENERGAN ) 25 MG tablet Take 25 mg by mouth every 6 (six) hours as needed for nausea.    rizatriptan (MAXALT) 10 MG tablet Take 10 mg by mouth once as needed for migraine. Max 2 tablets/week   rosuvastatin  (CRESTOR ) 40 MG tablet Take 1 tablet (40 mg total) by mouth at bedtime.   RYBELSUS  14 MG TABS Take 14 mg by  mouth daily.   spironolactone (ALDACTONE) 50 MG tablet Take 1 tablet by mouth at bedtime.   traZODone  (DESYREL ) 100 MG tablet Take 1.5 tablets (150 mg total) by mouth at bedtime. (Patient taking differently: Take 200 mg by mouth at bedtime.)   TRESIBA  FLEXTOUCH 100 UNIT/ML FlexTouch Pen Inject 24 Units into the skin at bedtime.   VENTOLIN  HFA 108 (90 Base) MCG/ACT inhaler INHALE 2 PUFFS BY MOUTH EVERY 6 HOURS AS NEEDED FOR WHEEZING FOR SHORTNESS OF BREATH (Patient taking differently: Inhale 2 puffs into the lungs every 6 (six) hours as needed for wheezing or shortness of breath.)     Allergies:   Chlorpheniramine-pseudoeph, Sulfa antibiotics, Tape, Tapentadol, Decongestant [pseudoephedrine hcl er], Duloxetine , Fremanezumab-vfrm, Levofloxacin, Tizanidine , Chlorpheniramine, Red dye #40 (allura red), Egg-derived products, Latex, Percocet [oxycodone -acetaminophen ], Prednisone, and Zoloft [sertraline hcl]   ROS:   Please see the history of present illness.    All other systems reviewed and are negative.  EKGs/Labs/Other Studies Reviewed:    The following studies were reviewed today: Cardiac Studies & Procedures   ______________________________________________________________________________________________ CARDIAC CATHETERIZATION  CARDIAC CATHETERIZATION 10/02/2023  Conclusion 1.  Inferior MI secondary to heavy thrombus in the right PDA and right PLA branches without any significant coronary stenoses 2.  Widely patent left main, LAD, left circumflex, and main RCA branch without significant stenosis 3.  Mild segmental LV dysfunction with hypokinesis of the basal and mid inferior wall 4.  Mildly elevated LVEDP  Recommendations: In this patient with TIMI-3 flow, I elected not to perform aspiration thrombectomy due to risk of embolization.  The patient does not appear to have any coronary lesions outside of the thrombotic appearance of her distal RCA branch vessels.  High suspicion for embolic  event.  Will treat with 18 hours of Aggrastat  and DAPT with aspirin  and ticagrelor .  Will check lower extremity venous duplex studies and would consider a transesophageal echo to evaluate for cardiac source of embolism.  If the patient does not have recurrent symptoms or worsening of her ST elevation, I do not know that she would really benefit from a relook catheterization.  Monitor in CV-ICU.  Findings Coronary Findings Diagnostic  Dominance: Right  Left Main Vessel is angiographically normal.  Left Anterior Descending The vessel exhibits minimal luminal irregularities.  Left Circumflex Vessel is angiographically normal.  Right Coronary Artery The vessel exhibits minimal luminal irregularities. Dist RCA lesion is 50% stenosed with 50% stenosed side branch in RPDA. The lesion is heavily thrombotic.  Right Posterior Atrioventricular Artery RPAV lesion is 50% stenosed. The lesion is heavily thrombotic.  Intervention  No interventions have been documented.   STRESS TESTS  MYOCARDIAL PERFUSION IMAGING 03/08/2017   ECHOCARDIOGRAM  ECHOCARDIOGRAM COMPLETE BUBBLE STUDY 10/03/2023  Narrative ECHOCARDIOGRAM REPORT    Patient Name:   Kristie Tapia Date of Exam: 10/03/2023 Medical Rec #:  [id number]  Height:       65.0 in Accession #:    [id number]      Weight:       182.3 lb Date of Birth:  Oct 14, 1970      BSA:          1.902 m Patient Age:    52 years        BP:           100/58 mmHg Patient Gender: F               HR:           60 bpm. Exam Location:  Inpatient  Procedure: 2D Echo, Color Doppler, Cardiac Doppler, Saline Contrast Bubble Study and Intracardiac Opacification Agent (Both Spectral and Color Flow Doppler were utilized during procedure).  Indications:     STEMI  History:         Patient has prior history of Echocardiogram examinations, most recent 12/28/2017.  Sonographer:     Hersey Lorenzo RDCS Referring Phys:  [id number] DENNIS NARCISSE JR Diagnosing  Phys: Archer Bear  IMPRESSIONS   1. Left ventricular ejection fraction, by estimation, is 50%. The left ventricle has mildly decreased function. The left ventricle demonstrates regional wall motion abnormalities with basal to mid inferior severe hypokinesis. Left ventricular diastolic parameters are consistent with Grade II diastolic dysfunction (pseudonormalization). 2. Right ventricular systolic function is normal. The right ventricular size is normal. Tricuspid regurgitation signal is inadequate for assessing PA pressure. 3. Bubble study was difficult but appears negative, no evidence for PFO/ASD. 4. The mitral valve is normal in structure. No evidence of mitral valve regurgitation. No evidence of mitral stenosis. 5. The aortic valve is tricuspid. Aortic valve regurgitation is not visualized. No aortic stenosis is present. 6. The inferior vena cava is dilated in size with >50% respiratory variability, suggesting right atrial pressure of 8 mmHg. 7. Technically difficult study with poor acoustic windows.  FINDINGS Left Ventricle: Left ventricular ejection fraction, by estimation, is 50%. The left ventricle has mildly decreased function. The left ventricle demonstrates regional wall motion abnormalities. The left ventricular internal cavity size was normal in size. There is no left ventricular hypertrophy. Left ventricular diastolic parameters are consistent with Grade II diastolic dysfunction (pseudonormalization).  Right Ventricle: The right ventricular size is normal. No increase in right ventricular wall thickness. Right ventricular systolic function is normal. Tricuspid regurgitation signal is inadequate for assessing PA pressure.  Left Atrium: Left atrial size was normal in size.  Right Atrium: Right atrial size was normal in size.  Pericardium: Trivial pericardial effusion is present.  Mitral Valve: The mitral valve is normal in structure. No evidence of mitral valve  regurgitation. No evidence of mitral valve stenosis.  Tricuspid Valve: The tricuspid valve is not well visualized. Tricuspid valve regurgitation is not demonstrated.  Aortic Valve: The aortic valve is tricuspid. Aortic valve regurgitation is not visualized. No aortic stenosis is present.  Pulmonic Valve: The pulmonic valve was normal in structure. Pulmonic valve regurgitation is not visualized.  Aorta: The aortic root is normal in size and structure.  Venous: The inferior vena cava is dilated in size with greater than 50% respiratory variability, suggesting right atrial pressure of 8 mmHg.  IAS/Shunts: Bubble study was difficult but appears negative, no evidence for PFO/ASD.   LEFT VENTRICLE PLAX 2D LVIDd:         4.90 cm     Diastology LVIDs:         3.70 cm  LV e' medial:    5.77 cm/s LV PW:         1.00 cm     LV E/e' medial:  9.4 LV IVS:        1.00 cm     LV e' lateral:   9.25 cm/s LVOT diam:     2.00 cm     LV E/e' lateral: 5.8 LV SV:         53 LV SV Index:   28 LVOT Area:     3.14 cm  LV Volumes (MOD) LV vol d, MOD A2C: 79.5 ml LV vol s, MOD A2C: 35.7 ml LV SV MOD A2C:     43.8 ml  RIGHT VENTRICLE            IVC RV S prime:     7.40 cm/s  IVC diam: 2.20 cm  LEFT ATRIUM           Index LA diam:      3.90 cm 2.05 cm/m LA Vol (A4C): 33.9 ml 17.82 ml/m AORTIC VALVE LVOT Vmax:   73.10 cm/s LVOT Vmean:  52.000 cm/s LVOT VTI:    0.170 m  AORTA Ao Root diam: 3.50 cm Ao Asc diam:  3.00 cm  MITRAL VALVE MV Area (PHT): 4.17 cm    SHUNTS MV Decel Time: 182 msec    Systemic VTI:  0.17 m MV E velocity: 54.00 cm/s  Systemic Diam: 2.00 cm MV A velocity: 57.80 cm/s MV E/A ratio:  0.93  Dalton McleanMD Electronically signed by Archer Bear Signature Date/Time: 10/03/2023/10:01:38 AM    Final (Updated)   TEE  ECHO TEE 10/04/2023  Narrative TRANSESOPHOGEAL ECHO REPORT    Patient Name:   Kristie Tapia Date of Exam: 10/04/2023 Medical Rec #:  [id number]        Height:       65.0 in Accession #:    [id number]      Weight:       184.1 lb Date of Birth:  May 23, 1971      BSA:          1.910 m Patient Age:    52 years        BP:           106/74 mmHg Patient Gender: F               HR:           96 bpm. Exam Location:  Inpatient  Procedure: Transesophageal Echo, Cardiac Doppler, Color Doppler and Saline Contrast Bubble Study (Both Spectral and Color Flow Doppler were utilized during procedure).  Indications:     Acute myocardial infarction I21.9  History:         Patient has prior history of Echocardiogram examinations, most recent 10/03/2023. Acute MI, Stroke; Risk Factors:Diabetes, Dyslipidemia and Current Smoker.  Sonographer:     Terrilee Few RCS Referring Phys:  [id number] ZANE ADAMS Diagnosing Phys: Grady Lawman MD  PROCEDURE: After discussion of the risks and benefits of a TEE, an informed consent was obtained from the patient. The transesophogeal probe was passed without difficulty through the esophogus of the patient. Imaged were obtained with the patient in a left lateral decubitus position. Local oropharyngeal anesthetic was provided with Cetacaine . Sedation performed by different physician. The patient was monitored while under deep sedation. Anesthestetic sedation was provided intravenously by Anesthesiology: 328.02mg  of Propofol , 100mg  of Lidocaine . Image quality was good. The patient's vital signs; including heart rate, blood pressure, and oxygen  saturation; remained stable throughout the procedure. The patient developed no complications during the procedure.  IMPRESSIONS   1. Left ventricular ejection fraction, by estimation, is 50 to 55%. The left ventricle has low normal function. 2. Right ventricular systolic function is normal. The right ventricular size is normal. 3. No left atrial/left atrial appendage thrombus was detected. The LAA emptying velocity was 76 cm/s. 4. The mitral valve is grossly normal. Trivial mitral  valve regurgitation. 5. The aortic valve is tricuspid. Aortic valve regurgitation is not visualized. No aortic stenosis is present. 6. There is Moderate (Grade III) plaque involving the descending aorta. 7. Evidence of atrial level shunting detected by color flow Doppler. Agitated saline contrast bubble study was positive with shunting observed within 3-6 cardiac cycles suggestive of interatrial shunt. Grade 2 right to left shunt with agitated saline injection. There is a patent foramen ovale with bidirectional shunting across atrial septum.  Conclusion(s)/Recommendation(s): PFO rims appear adequate for closure if indicated. Challenging imaging.  FINDINGS Left Ventricle: Left ventricular ejection fraction, by estimation, is 50 to 55%. The left ventricle has low normal function. The left ventricular internal cavity size was normal in size.  Right Ventricle: The right ventricular size is normal. No increase in right ventricular wall thickness. Right ventricular systolic function is normal.  Left Atrium: Left atrial size was normal in size. No left atrial/left atrial appendage thrombus was detected. The LAA emptying velocity was 76 cm/s.  Right Atrium: Right atrial size was normal in size.  Pericardium: Trivial pericardial effusion is present.  Mitral Valve: The mitral valve is grossly normal. Trivial mitral valve regurgitation.  Tricuspid Valve: The tricuspid valve is normal in structure. Tricuspid valve regurgitation is trivial. No evidence of tricuspid stenosis.  Aortic Valve: The aortic valve is tricuspid. Aortic valve regurgitation is not visualized. No aortic stenosis is present. Aortic valve peak gradient measures 5.9 mmHg.  Pulmonic Valve: The pulmonic valve was normal in structure. Pulmonic valve regurgitation is trivial. No evidence of pulmonic stenosis.  Aorta: The aortic root is normal in size and structure. There is moderate (Grade III) plaque involving the descending  aorta.  IAS/Shunts: There is redundancy of the interatrial septum. The interatrial septum appears to be lipomatous. Evidence of atrial level shunting detected by color flow Doppler. Agitated saline contrast was given intravenously to evaluate for intracardiac shunting. Agitated saline contrast bubble study was positive with shunting observed within 3-6 cardiac cycles suggestive of interatrial shunt. Grade 2 right to left shunt with agitated saline injection.  Additional Comments: Spectral Doppler performed.  LEFT VENTRICLE PLAX 2D LVOT diam:     2.30 cm LV SV:         68 LV SV Index:   35 LVOT Area:     4.15 cm   AORTIC VALVE AV Area (Vmax): 2.19 cm AV Vmax:        121.00 cm/s AV Peak Grad:   5.9 mmHg LVOT Vmax:      63.80 cm/s LVOT Vmean:     43.000 cm/s LVOT VTI:       0.163 m  AORTA Ao Root diam: 3.50 cm Ao Asc diam:  3.20 cm   SHUNTS Systemic VTI:  0.16 m Systemic Diam: 2.30 cm  Grady Lawman MD Electronically signed by Grady Lawman MD Signature Date/Time: 10/04/2023/10:48:57 AM    Final (Updated)        ______________________________________________________________________________________________      EKG:        Recent Labs: 10/04/2023: BUN 8; Creatinine, Ser 0.94;  Hemoglobin 13.5; Platelets 274; Potassium 4.1; Sodium 137  Recent Lipid Panel    Component Value Date/Time   CHOL 72 10/03/2023 0310   CHOL 96 (L) 07/06/2017 1442   TRIG 217 (H) 10/03/2023 0310   HDL 21 (L) 10/03/2023 0310   HDL 39 (L) 07/06/2017 1442   CHOLHDL 3.4 10/03/2023 0310   VLDL 43 (H) 10/03/2023 0310   LDLCALC 8 10/03/2023 0310   LDLCALC 34 07/06/2017 1442     Risk Assessment/Calculations:                Physical Exam:    VS:  BP 104/70 (BP Location: Left Arm)   Pulse 67   Ht 5' 4 (1.626 m)   Wt 188 lb (85.3 kg)   SpO2 97%   BMI 32.27 kg/m     Wt Readings from Last 3 Encounters:  11/14/23 188 lb (85.3 kg)  10/25/23 188 lb (85.3 kg)  10/04/23 184 lb  1.4 oz (83.5 kg)     GEN:  Well nourished, well developed in no acute distress HEENT: Normal NECK: No JVD; No carotid bruits LYMPHATICS: No lymphadenopathy CARDIAC: RRR, no murmurs, rubs, gallops RESPIRATORY:  Clear to auscultation without rales, wheezing or rhonchi  ABDOMEN: Soft, non-tender, non-distended MUSCULOSKELETAL:  No edema; No deformity  SKIN: Warm and dry NEUROLOGIC:  Alert and oriented x 3 PSYCHIATRIC:  Normal affect   Assessment & Plan Coronary artery disease involving native coronary artery of native heart with angina pectoris (HCC) The patient had an acute inferior MI with subocclusive thrombus in the distal RCA and its branch vessels.  I again reviewed all of her hospital records which showed a normal hypercoagulable panel, presence of a PFO on TEE, and negative DVT lower extremity venous duplex.  After further review of her coronary angiogram, I think it is less likely that she had an embolic event and more likely that this represented Denovo thrombus in the coronary artery.  There is no indication for ongoing anticoagulant therapy.  I do not think there is any indication for PFO closure.  I suspect the combination of diabetes and tobacco abuse led her to be at risk for this event.  I have recommended today that she stop apixaban , continue clopidogrel , and add aspirin  81 mg through 1 year.  When she is out to 1 year from her MI, would consider discontinuation of aspirin  and keeping her on clopidogrel . PFO (patent foramen ovale) Reviewed her TEE images personally today.  She does have a PFO, but I do not think there is any clinical indication for transcatheter closure.  Continue medical therapy.  Reviewed with the patient help, and the presence of a PFO as in that transcatheter closure could be considered in the future if she has any embolic events attributable to PFO. Tobacco abuse Applauded for her efforts at tobacco cessation.  Emphasized that this is the most important  modifiable risk factor for prevention of further cardiovascular ischemic events. Dizziness Continue to follow with neurology.  Reviewed her most recent MRI.            Medication Adjustments/Labs and Tests Ordered: Current medicines are reviewed at length with the patient today.  Concerns regarding medicines are outlined above.  No orders of the defined types were placed in this encounter.  No orders of the defined types were placed in this encounter.   There are no Patient Instructions on file for this visit.   Signed, Arnoldo Lapping, MD  11/14/2023 9:09 AM  [facility]

## 2023-11-14 NOTE — Patient Instructions (Signed)
 Medication Instructions:  STOP Eliquis    START Aspirin  81 mg daily   *If you need a refill on your cardiac medications before your next appointment, please call your pharmacy*  Follow-Up: At [facility], you and your health needs are our priority.  As part of our continuing mission to provide you with exceptional heart care, our providers are all part of one team.  This team includes your primary Cardiologist (physician) and Advanced Practice Providers or APPs (Physician Assistants and Nurse Practitioners) who all work together to provide you with the care you need, when you need it.  Your next appointment:   3-4 months    Provider:   One of our Advanced Practice Providers (APPs): Melita Springer, PA-C  Friddie Jetty, NP Evaline Hill, NP  Theotis Flake, PA-C Lawana Pray, NP  Willis Harter, PA-C Lovette Rud, PA-C  Secretary, [place] Charles Connor, NP  Marlana Silvan, NP Marcie Sever, PA-C  Laquita Plant, PA-C    Dayna Dunn, PA-C  Marlyse Single, PA-C Palmer Bobo, NP Katlyn West, NP Callie Goodrich, PA-C  Evan Williams, PA-C Sheng Haley, PA-C  Xika Zhao, NP Kathleen Johnson, PA-C

## 2024-01-19 ENCOUNTER — Other Ambulatory Visit: Payer: Self-pay | Admitting: Cardiology

## 2024-02-14 ENCOUNTER — Ambulatory Visit: Admitting: Cardiology

## 2024-02-20 ENCOUNTER — Ambulatory Visit: Payer: Self-pay | Admitting: General Surgery

## 2024-02-20 ENCOUNTER — Telehealth: Payer: Self-pay

## 2024-02-20 DIAGNOSIS — Z01818 Encounter for other preprocedural examination: Secondary | ICD-10-CM

## 2024-02-20 NOTE — Telephone Encounter (Signed)
   Pre-operative Risk Assessment    Patient Name: Kristie Tapia  DOB: [birth year] MRN: [id number]   Date of last office visit: 11/14/23 [name], MD Date of next office visit: 02/25/24 [name], NP   Request for Surgical Clearance    Procedure:  EXCISION OF BILATERAL GROIN HIDRADENITIS SURGERY  Date of Surgery:  Clearance TBD                                Surgeon:  RICHERD SILVERSMITH, MD Surgeon's Group or Practice Name:  [facility] Phone number:  [phone] Fax number:  [phone]  ATTN: COLETA MOLT, CMA   Type of Clearance Requested:   - Medical  - Pharmacy:  Hold Aspirin  and Clopidogrel  (Plavix )     Type of Anesthesia:  General    Additional requests/questions:    Signed, Lucie DELENA Ku   02/20/2024, 5:04 PM

## 2024-02-20 NOTE — H&P (Signed)
 REFERRING PHYSICIAN:  Self PROVIDER:  RICHERD SILVERSMITH, MD MRN: [id number] DOB: Feb 04, 1971 DATE OF ENCOUNTER: 02/20/2024 Subjective    Reason for Referral: hidradenitis History of Present Illness: Kristie Tapia is a 53 y.o. female who is seen today as an office consultation for evaluation of axillary and groin hidradenitis.  Patient has had HS in bilateral axillas and groins for many years. Has had areas excised remotely and also had perianal abscesses drained that were thought to also be related to HS. She states the groin areas bother her the most. Her axillary HS will wax and wane. Several weeks ago she had an area in her right axilla that was more inflamed and tender. She was prescribed doxycycline  and applied warm compressed. With warm compresses a pustule came to a head and she incised this herself and home and it drained and gave her relief.  She does complain of some itching around the right sided HS sinuses/tracts.  Most recent HbA1c is 6.6 from April of this year.  She is on aspirin  and plavix  for history of stroke and MI.    Review of Systems: A complete review of systems was obtained from the patient.  I have reviewed this information and discussed as appropriate with the patient.  ROS otherwise negative except as noted in HPI.   Medical History: Past Medical History:  Diagnosis Date   Anxiety    Arthritis    Diabetes mellitus without complication (CMS-HCC)    History of stroke    Sleep apnea     There is no problem list on file for this patient.   Past Surgical History:  Procedure Laterality Date   Irrigation and Debridement Buttocks Abscess and retroperitoneum  05/23/2020   Dr. Rubin   Incision and Drainage Buttock Abscess  05/25/2020   Dr. Rubin     Allergies  Allergen Reactions   Adhesive Other (See Comments)    Blisters USE PAPER TAPE ONLY    Sertraline Rash and Hives   Sulfa (Sulfonamide Antibiotics) Other (See Comments)    Burns the inside of  my mouth; blisters; leaves tongue and inside of mouth solid red   Tapentadol Other (See Comments)    Blisters- USE PAPER TAPE ONLY   Duloxetine  Other (See Comments)    Felt out of it Felt out of it    Dynafed-Er [Chlorpheniramine-Pseudoephed] Itching    ALL DECONGESTANTS   Fremanezumab-Vfrm Other (See Comments) and Nausea   Prednisone Other (See Comments)    Gives me a bad attitude Gives me a bad attitude    Tizanidine  Other (See Comments)    headache headache    Chlorpheniramine Itching   Levofloxacin Other (See Comments)   Red Dye Other (See Comments)    CAUSES MIGRAINES   Eggshell Membrane Nausea And Vomiting   Latex Itching   Oxycodone -Acetaminophen  Other (See Comments)    PT reports having nightmares when taking Percocet PT reports having nightmares when taking Percocet     Current Outpatient Medications on File Prior to Visit  Medication Sig Dispense Refill   albuterol  (ACCUNEB ) 0.63 mg/3 mL nebulizer solution Take 0.63 mg by nebulization every 6 (six) hours as needed for Wheezing     clopidogreL  (PLAVIX ) 75 mg tablet Take 75 mg by mouth once daily     cyanocobalamin (VITAMIN B12) 1,000 mcg/mL injection Inject into the muscle monthly     donepeziL  (ARICEPT ) 10 MG tablet Take 10 mg by mouth at bedtime     doxycycline  (VIBRAMYCIN ) 100 MG capsule Take  100 mg by mouth 2 (two) times daily     escitalopram  oxalate (LEXAPRO ) 20 MG tablet Take 20 mg by mouth once daily     famotidine  (PEPCID ) 40 MG tablet Take 40 mg by mouth at bedtime     FUROsemide  (LASIX ) 20 MG tablet Take 20 mg by mouth once daily     insulin  DEGLUDEC (TRESIBA  FLEXTOUCH) pen injector (concentration 100 units/mL) Use as directed. Max daily dose is 50 units.     insulin  LISPRO (HUMALOG  KWIKPEN) pen injector (concentration 100 units/mL) AS DIRECTED MAX DAILY DOSE OF 100 UNITS     LORazepam  (ATIVAN ) 1 MG tablet lorazepam  1 mg tablet     multivitamin capsule Take 1 capsule by mouth once daily      pantoprazole  (PROTONIX ) 40 MG DR tablet pantoprazole  40 mg tablet,delayed release     pioglitazone  (ACTOS ) 45 MG tablet Take by mouth     pregabalin  (LYRICA ) 100 MG capsule Take by mouth     promethazine  (PHENERGAN ) 25 MG tablet promethazine  25 mg tablet     rizatriptan (MAXALT) 10 MG tablet Take 10 mg by mouth as directed for Migraine May take a second dose after 2 hours if needed.     rosuvastatin  (CRESTOR ) 20 MG tablet Take 20 mg by mouth once daily     traZODone  (DESYREL ) 100 MG tablet trazodone  100 mg tablet     No current facility-administered medications on file prior to visit.    Family History  Problem Relation Age of Onset   Obesity Father    High blood pressure (Hypertension) Father    Hyperlipidemia (Elevated cholesterol) Father    Diabetes Father      Social History   Tobacco Use  Smoking Status Every Day   Types: Cigarettes  Smokeless Tobacco Never     Social History   Socioeconomic History   Marital status: Married  Tobacco Use   Smoking status: Every Day    Types: Cigarettes   Smokeless tobacco: Never  Substance and Sexual Activity   Alcohol use: Never   Drug use: Never   Social Drivers of Corporate investment banker Strain: Medium Risk (09/27/2023)   Received from [facility]   Overall Financial Resource Strain (CARDIA)    Difficulty of Paying Living Expenses: Somewhat hard  Food Insecurity: No Food Insecurity (10/03/2023)   Received from [facility]   Hunger Vital Sign    Within the past 12 months, you worried that your food would run out before you got the money to buy more.: Never true    Within the past 12 months, the food you bought just didn't last and you didn't have money to get more.: Never true  Recent Concern: Food Insecurity - Food Insecurity Present (09/27/2023)   Received from [facility]   Hunger Vital Sign    Within the past 12 months, you worried that your food would run out before you got the money to buy more.: Sometimes true     Within the past 12 months, the food you bought just didn't last and you didn't have money to get more.: Sometimes true  Transportation Needs: No Transportation Needs (10/03/2023)   Received from [facility] - Transportation    Lack of Transportation (Medical): No    Lack of Transportation (Non-Medical): No  Physical Activity: Inactive (09/27/2023)   Received from [facility]   Exercise Vital Sign    On average, how many days per week do you engage in  moderate to strenuous exercise (like a brisk walk)?: 0 days    On average, how many minutes do you engage in exercise at this level?: 10 min  Stress: No Stress Concern Present (09/27/2023)   Received from [facility] - Occupational Stress Questionnaire    Feeling of Stress : Only a little  Social Connections: Socially Integrated (09/27/2023)   Received from [facility]   Social Network    How would you rate your social network (family, work, friends)?: Good participation with social networks    Objective:   Vitals:   02/20/24 0942 02/20/24 0943  Pulse: 82   Resp: 18   Temp: 36.8 C (98.2 F)   SpO2: 98%   Weight: 88.8 kg (195 lb 12.8 oz)   Height: 165.1 cm (5' 5)   PainSc:    2    Body mass index is 32.58 kg/m.  Physical Exam: General: No acute distress, well appearing HEENT: PERRL, hearing grossly normal, mucous membranes moist. Oligodontia CV: Regular rate and rhythm Pulm: Normal work of breathing on room air Abd: Soft, nontender, nondistended GU: Multiple cords/sinuses to right and left groin creases, R> L. No erythema. Mild tenderness to palpation Extremities: Bilateral axillary HS. Thickened cords and sinuses felt and several small cavities. Minimally tender. No erythema or drainage. Neuro: Alert and oriented Psych: Appropriate mood and effect  Labs, Imaging and Diagnostic Testing: I have personally reviewed any pertinent labs. I have personally reviewed  referral notes.  Assessment and Plan:     Diagnoses and all orders for this visit:  Hidradenitis suppurativa -     Ambulatory Referral to Dermatology     - Discussed with patient that I would recommend addressing groins during one operation and axillas in a separate operation. She would like to proceed with groins first. - We discussed risks of surgery including but not limited to: bleeding, infection, recurrence, skin and wound dehiscence. We discussed given her DM, increased risk for post-op incision or wound breakdown. Discussed that she will have external suture that will be removed 7-10 days after surgery. - Recommended seeing a dermatologist to help with medical management of HS to help manage future HS exacerbations. Referral placed. - Will get cardiology and neurology clearance for OR given history of MI and CVA.      I spent a total of 35 minutes in both face-to-face and non-face-to-face activities, excluding procedures performed, for this visit on the date of this encounter.  Orie Silversmith, MD [facility]

## 2024-02-21 NOTE — Telephone Encounter (Signed)
 Agree. Unless surgery urgent then 6 months would be reasonable. Otherwise safest to defer x 12 months for full course of DAPT.

## 2024-02-23 NOTE — Progress Notes (Unsigned)
 Cardiology Office Note    Date:  02/25/2024  ID:  Kristie Tapia, DOB Oct 26, 1970, MRN [id number] PCP:  System, Provider Not In  Cardiologist:  [name], MD  Electrophysiologist:  None   Chief Complaint: Follow up for STEMI  History of Present Illness: .   Kristie Tapia is a 53 y.o. female with visit-pertinent history of inferior STEMI, PFO, history of embolic stroke in 2019, type 2 diabetes mellitus.   On/30/25 patient presented to the ED with reports of chest pain earlier in the day, patient reported that she had woken up feeling like she had severe reflux and chest pressure.  She had nausea and an episode of vomiting.  Patient's husband took her to urgent care where ECG was unremarkable labs were drawn including a troponin of 40,000, she was directed to the emergency department.  Repeat ECG showed inferior ST elevation.  LHC indicated to heavy clot burden in the distal RCA RPDA/RPA V bifurcation, otherwise coronary arteries were normal.  It was felt that this was embolized in nature, as such decision was made to treat medically with Aggrastat  and DAPT with aspirin /ticagrelor .  Echocardiogram indicated LVEF of 50%, wall motion abnormalities were noted with basal to mid inferior severe hypokinesis, G2 DD, RV systolic function and size was normal, without good windows bubble study was difficult to interpret, no significant valvular abnormalities.  Lower extremity ultrasound showed no evidence of DVT.  Echo TEE on 10/04/2023 indicated LVEF of 50 to 55%, there was evidence of atrial level shunting detected by color-flow Doppler, was felt that PFO rim appeared adequate for closure.  During hospitalization patient reported increased dyspnea, she was transition from ticagrelor  to clopidogrel .  Patient was referred to Dr. Fell for possible closure of PFO, patient was discharged on Plavix , Eliquis  5 mg twice daily and statin.  Patient was seen by Dr. Fell on 11/14/2023.  He reviewed her  hospital records which showed a normal hypercoagulable panel, presence of a PFO on TEE and negative DVT lower extremity venous duplex.  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  It was felt that combination of diabetes and tobacco abuse led her to be at risk for this event, recommended that she stop apixaban , continue clopidogrel  and add aspirin  81 mg through 1 year.  Today she presents for follow-up.  She reports that she is doing ok, notes ongoing problems with lower back pain and HS. She notes an occasional chest discomfort not associated with exertion, reports it worsens with stretching, feels musculoskeletal in nature. She denies chest pain, tightness or pressure on exertion. She denies increased DOE, palpitations, increased lower extremity edema, orthopnea or pnd.  She denies any presyncope or syncope, reports that she has been tolerating her medications well.  Patient reports that she was planning to have a procedure related to her at bedtime, discussed with patient recommendation from Dr. Fell that she continue on Plavix  and aspirin  for 1 year given recent STEMI, patient stated understanding. ROS: .   Today she denies shortness of breath, lower extremity edema, fatigue, palpitations, melena, hematuria, hemoptysis, weakness, presyncope, syncope, orthopnea, and PND.  All other systems are reviewed and otherwise negative. Studies Reviewed: SABRA   EKG:  EKG is ordered today, personally reviewed, demonstrating  EKG Interpretation Date/Time:  Tuesday February 25 2024 09:13:20 EDT Ventricular Rate:  67 PR Interval:  190 QRS Duration:  82 QT Interval:  434 QTC Calculation: 458 R Axis:   35  Text Interpretation: Normal sinus rhythm Nonspecific ST abnormality When compared  with ECG of 25-Oct-2023 09:05, Nonspecific T wave abnormality has replaced inverted T waves in Inferior leads Nonspecific T wave abnormality no longer evident in Anterolateral leads Confirmed by Lequisha Cammack [phone]) on 02/25/2024 5:14:44 PM   CV Studies: Cardiac studies reviewed are outlined and summarized above. Otherwise please see EMR for full report. Cardiac Studies & Procedures   ______________________________________________________________________________________________ CARDIAC CATHETERIZATION  CARDIAC CATHETERIZATION 10/02/2023  Conclusion 1.  Inferior MI secondary to heavy thrombus in the right PDA and right PLA branches without any significant coronary stenoses 2.  Widely patent left main, LAD, left circumflex, and main RCA branch without significant stenosis 3.  Mild segmental LV dysfunction with hypokinesis of the basal and mid inferior wall 4.  Mildly elevated LVEDP  Recommendations: In this patient with TIMI-3 flow, I elected not to perform aspiration thrombectomy due to risk of embolization.  The patient does not appear to have any coronary lesions outside of the thrombotic appearance of her distal RCA branch vessels.  High suspicion for embolic event.  Will treat with 18 hours of Aggrastat  and DAPT with aspirin  and ticagrelor .  Will check lower extremity venous duplex studies and would consider a transesophageal echo to evaluate for cardiac source of embolism.  If the patient does not have recurrent symptoms or worsening of her ST elevation, I do not know that she would really benefit from a relook catheterization.  Monitor in CV-ICU.  Findings Coronary Findings Diagnostic  Dominance: Right  Left Main Vessel is angiographically normal.  Left Anterior Descending The vessel exhibits minimal luminal irregularities.  Left Circumflex Vessel is angiographically normal.  Right Coronary Artery The vessel exhibits minimal luminal irregularities. Dist RCA lesion is 50% stenosed  with 50% stenosed side branch in RPDA. The lesion is heavily thrombotic.  Right Posterior Atrioventricular Artery RPAV lesion is 50% stenosed. The lesion is heavily thrombotic.  Intervention  No interventions have been documented.   STRESS TESTS  MYOCARDIAL PERFUSION IMAGING 03/08/2017   ECHOCARDIOGRAM  ECHOCARDIOGRAM COMPLETE BUBBLE STUDY 10/03/2023  Narrative ECHOCARDIOGRAM REPORT    Patient Name:   TEXIE TUPOU Date of Exam: 10/03/2023 Medical Rec #:  [id number]       Height:       65.0 in Accession #:    [id number]      Weight:       182.3 lb Date of Birth:  07-24-1970      BSA:          1.902 m Patient Age:    52 years        BP:           100/58 mmHg Patient Gender: F               HR:           60 bpm. Exam Location:  Inpatient  Procedure: 2D Echo, Color Doppler, Cardiac Doppler, Saline Contrast Bubble Study and Intracardiac Opacification Agent (Both Spectral and Color Flow Doppler were utilized during procedure).  Indications:     STEMI  History:         Patient has prior history of Echocardiogram examinations, most recent 12/28/2017.  Sonographer:     Tinnie Gosling RDCS Referring Phys:  [id number] DENNIS NARCISSE JR Diagnosing Phys: Ezra Kanner  IMPRESSIONS   1. Left ventricular ejection fraction, by estimation, is 50%. The left ventricle has mildly decreased function. The left ventricle demonstrates regional wall motion abnormalities with basal to mid inferior severe hypokinesis. Left ventricular diastolic parameters are consistent with Grade II diastolic dysfunction (pseudonormalization). 2. Right ventricular systolic function is normal. The right ventricular size is normal. Tricuspid regurgitation signal is inadequate for assessing PA pressure. 3. Bubble study was difficult but appears negative, no evidence for PFO/ASD. 4. The mitral valve is normal in structure. No evidence of mitral valve regurgitation. No evidence of mitral stenosis. 5. The aortic  valve is tricuspid. Aortic valve regurgitation is not visualized. No aortic stenosis is present. 6. The inferior vena cava is dilated in size with >50% respiratory variability, suggesting right atrial pressure of 8 mmHg. 7. Technically difficult study with poor acoustic windows.  FINDINGS Left Ventricle: Left ventricular ejection fraction, by estimation, is 50%. The left ventricle has mildly decreased function. The left ventricle demonstrates regional wall motion abnormalities. The left ventricular internal cavity size was normal in size. There is no left ventricular hypertrophy. Left ventricular diastolic parameters are consistent with Grade II diastolic dysfunction (pseudonormalization).  Right Ventricle: The right ventricular size is normal. No increase in right ventricular wall thickness. Right ventricular systolic function is normal. Tricuspid regurgitation signal is inadequate for assessing PA pressure.  Left [facility]: Left atrial size was normal in size.  Right [facility]: Right atrial size was normal in size.  Pericardium: Trivial pericardial effusion is present.  Mitral Valve: The mitral valve is normal in structure. No evidence of mitral valve regurgitation. No evidence of mitral valve stenosis.  Tricuspid Valve: The tricuspid valve is not well visualized. Tricuspid valve regurgitation is not demonstrated.  Aortic Valve: The aortic valve is tricuspid. Aortic valve regurgitation is not visualized. No aortic stenosis is present.  Pulmonic Valve: The pulmonic valve was normal in structure. Pulmonic valve regurgitation is not visualized.  Aorta: The aortic root is normal in size and structure.  Venous: The inferior vena cava is dilated in size with greater than 50% respiratory variability, suggesting right atrial pressure of 8 mmHg.  IAS/Shunts: Bubble study was difficult but appears negative, no evidence for PFO/ASD.   LEFT VENTRICLE PLAX 2D LVIDd:         4.90 cm      Diastology LVIDs:         3.70 cm     LV e' medial:    5.77 cm/s LV PW:         1.00 cm     LV E/e' medial:  9.4 LV IVS:        1.00 cm     LV e' lateral:   9.25 cm/s LVOT diam:     2.00 cm     LV E/e' lateral: 5.8 LV SV:         53 LV SV Index:   28 LVOT Area:     3.14 cm  LV Volumes (MOD) LV vol d, MOD A2C: 79.5 ml LV vol s, MOD A2C: 35.7 ml LV SV MOD A2C:     43.8 ml  RIGHT VENTRICLE            IVC RV S prime:     7.40 cm/s  IVC diam: 2.20 cm  LEFT ATRIUM           Index LA diam:      3.90 cm 2.05 cm/m LA Vol (A4C): 33.9 ml 17.82 ml/m AORTIC VALVE LVOT  Vmax:   73.10 cm/s LVOT Vmean:  52.000 cm/s LVOT VTI:    0.170 m  AORTA Ao Root diam: 3.50 cm Ao Asc diam:  3.00 cm  MITRAL VALVE MV Area (PHT): 4.17 cm    SHUNTS MV Decel Time: 182 msec    Systemic VTI:  0.17 m MV E velocity: 54.00 cm/s  Systemic Diam: 2.00 cm MV A velocity: 57.80 cm/s MV E/A ratio:  0.93  Dalton McleanMD Electronically signed by Ezra Kanner Signature Date/Time: 10/03/2023/10:01:38 AM    Final (Updated)   TEE  ECHO TEE 10/04/2023  Narrative TRANSESOPHOGEAL ECHO REPORT    Patient Name:   YANETH FAIRBAIRN Date of Exam: 10/04/2023 Medical Rec #:  [id number]       Height:       65.0 in Accession #:    [id number]      Weight:       184.1 lb Date of Birth:  [birth year]      BSA:          1.910 m Patient Age:    52 years        BP:           106/74 mmHg Patient Gender: F               HR:           96 bpm. Exam Location:  Inpatient  Procedure: Transesophageal Echo, Cardiac Doppler, Color Doppler and Saline Contrast Bubble Study (Both Spectral and Color Flow Doppler were utilized during procedure).  Indications:     Acute myocardial infarction I21.9  History:         Patient has prior history of Echocardiogram examinations, most recent 10/03/2023. Acute MI, Stroke; Risk Factors:Diabetes, Dyslipidemia and Current Smoker.  Sonographer:     Thea Norlander RCS Referring Phys:  [id number]  ZANE ADAMS Diagnosing Phys: Soyla Merck MD  PROCEDURE: After discussion of the risks and benefits of a TEE, an informed consent was obtained from the patient. The transesophogeal probe was passed without difficulty through the esophogus of the patient. Imaged were obtained with the patient in a left lateral decubitus position. Local oropharyngeal anesthetic was provided with Cetacaine . Sedation performed by different physician. The patient was monitored while under deep sedation. Anesthestetic sedation was provided intravenously by Anesthesiology: 328.02mg  of Propofol , 100mg  of Lidocaine . Image quality was good. The patient's vital signs; including heart rate, blood pressure, and oxygen saturation; remained stable throughout the procedure. The patient developed no complications during the procedure.  IMPRESSIONS   1. Left ventricular ejection fraction, by estimation, is 50 to 55%. The left ventricle has low normal function. 2. Right ventricular systolic function is normal. The right ventricular size is normal. 3. No left atrial/left atrial appendage thrombus was detected. The LAA emptying velocity was 76 cm/s. 4. The mitral valve is grossly normal. Trivial mitral valve regurgitation. 5. The aortic valve is tricuspid. Aortic valve regurgitation is not visualized. No aortic stenosis is present. 6. There is Moderate (Grade III) plaque involving the descending aorta. 7. Evidence of atrial level shunting detected by color flow Doppler. Agitated saline contrast bubble study was positive with shunting observed within 3-6 cardiac cycles suggestive of interatrial shunt. Grade 2 right to left shunt with agitated saline injection. There is a patent foramen ovale with bidirectional shunting across atrial septum.  Conclusion(s)/Recommendation(s): PFO rims appear adequate for closure if indicated. Challenging imaging.  FINDINGS Left Ventricle: Left ventricular ejection fraction, by estimation, is 50 to  55%. The left  ventricle has low normal function. The left ventricular internal cavity size was normal in size.  Right Ventricle: The right ventricular size is normal. No increase in right ventricular wall thickness. Right ventricular systolic function is normal.  Left [facility]: Left atrial size was normal in size. No left atrial/left atrial appendage thrombus was detected. The LAA emptying velocity was 76 cm/s.  Right [facility]: Right atrial size was normal in size.  Pericardium: Trivial pericardial effusion is present.  Mitral Valve: The mitral valve is grossly normal. Trivial mitral valve regurgitation.  Tricuspid Valve: The tricuspid valve is normal in structure. Tricuspid valve regurgitation is trivial. No evidence of tricuspid stenosis.  Aortic Valve: The aortic valve is tricuspid. Aortic valve regurgitation is not visualized. No aortic stenosis is present. Aortic valve peak gradient measures 5.9 mmHg.  Pulmonic Valve: The pulmonic valve was normal in structure. Pulmonic valve regurgitation is trivial. No evidence of pulmonic stenosis.  Aorta: The aortic root is normal in size and structure. There is moderate (Grade III) plaque involving the descending aorta.  IAS/Shunts: There is redundancy of the interatrial septum. The interatrial septum appears to be lipomatous. Evidence of atrial level shunting detected by color flow Doppler. Agitated saline contrast was given intravenously to evaluate for intracardiac shunting. Agitated saline contrast bubble study was positive with shunting observed within 3-6 cardiac cycles suggestive of interatrial shunt. Grade 2 right to left shunt with agitated saline injection.  Additional Comments: Spectral Doppler performed.  LEFT VENTRICLE PLAX 2D LVOT diam:     2.30 cm LV SV:         68 LV SV Index:   35 LVOT Area:     4.15 cm   AORTIC VALVE AV Area (Vmax): 2.19 cm AV Vmax:        121.00 cm/s AV Peak Grad:   5.9 mmHg LVOT Vmax:      63.80  cm/s LVOT Vmean:     43.000 cm/s LVOT VTI:       0.163 m  AORTA Ao Root diam: 3.50 cm Ao Asc diam:  3.20 cm   SHUNTS Systemic VTI:  0.16 m Systemic Diam: 2.30 cm  Soyla Merck MD Electronically signed by Soyla Merck MD Signature Date/Time: 10/04/2023/10:48:57 AM    Final (Updated)        ______________________________________________________________________________________________       Current Reported Medications:.    Current Meds  Medication Sig   acetaminophen  (TYLENOL ) 500 MG tablet Take 1,000 mg by mouth every 6 (six) hours as needed for headache, moderate pain (pain score 4-6) or mild pain (pain score 1-3) (for headaches).   Aspirin  81 MG CAPS Take 81 mg by mouth daily.   budesonide-formoterol (SYMBICORT) 160-4.5 MCG/ACT inhaler Inhale 2 puffs into the lungs in the morning and at bedtime.   budesonide-glycopyrrolate -formoterol (BREZTRI AEROSPHERE) 160-9-4.8 MCG/ACT AERO inhaler Inhale 2 puffs into the lungs 2 (two) times daily.   clindamycin  (CLEOCIN  T) 1 % external solution    clopidogrel  (PLAVIX ) 75 MG tablet Take 1 tablet (75 mg total) by mouth daily.   Continuous Blood Gluc Sensor (FREESTYLE LIBRE 14 DAY SENSOR) MISC 1 each by Other route every 14 (fourteen) days. Use to monitor glucose levels. Change sensor every 14 days   cyanocobalamin (,VITAMIN B-12,) 1000 MCG/ML injection Inject 1,000 mcg into the muscle every Saturday.   diclofenac  (VOLTAREN ) 75 MG EC tablet Take 75 mg by mouth 2 (two) times daily.   donepezil  (ARICEPT ) 10 MG tablet Take 10 mg by mouth at bedtime.  escitalopram  (LEXAPRO ) 20 MG tablet Take 20 mg by mouth at bedtime.   famotidine  (PEPCID ) 40 MG tablet Take 40 mg by mouth at bedtime.   fluticasone  (FLONASE ) 50 MCG/ACT nasal spray Place 1-2 sprays into both nostrils daily as needed for allergies or rhinitis. (Patient taking differently: Place 2 sprays into both nostrils daily.)   furosemide  (LASIX ) 20 MG tablet TAKE 1 TABLET BY MOUTH  ONCE DAILY AS NEEDED FOR  SWELLING   Insulin  Aspart FlexPen (NOVOLOG ) 100 UNIT/ML Inject 6-12 Units into the skin in the morning, at noon, and at bedtime.   Insulin  Disposable Pump (V-GO 40) KIT 1 Device by Does not apply route daily.   lamoTRIgine  (LAMICTAL ) 25 MG tablet Take 25 mg by mouth at bedtime.   LORazepam  (ATIVAN ) 1 MG tablet TAKE 1 TABLET BY MOUTH TWICE DAILY AS NEEDED FOR ANXIETY (Patient taking differently: Take 1-2 mg by mouth daily as needed for anxiety.)   Multiple Vitamin (MULTIVITAMIN WITH MINERALS) TABS tablet Take 1 tablet by mouth daily.   pantoprazole  (PROTONIX ) 40 MG tablet Take 40 mg by mouth 2 (two) times daily.    pregabalin  (LYRICA ) 100 MG capsule Take 100-200 mg by mouth See admin instructions. Take 1 tablet (100 mg) by mouth every morning and 2 tablets (200 mg) at night   promethazine  (PHENERGAN ) 25 MG tablet Take 25 mg by mouth every 6 (six) hours as needed for nausea.    RYBELSUS  14 MG TABS Take 14 mg by mouth daily.   spironolactone (ALDACTONE) 50 MG tablet Take 1 tablet by mouth at bedtime.   traZODone  (DESYREL ) 100 MG tablet Take 1.5 tablets (150 mg total) by mouth at bedtime. (Patient taking differently: Take 200 mg by mouth at bedtime.)   TRESIBA  FLEXTOUCH 100 UNIT/ML FlexTouch Pen Inject 24 Units into the skin at bedtime.   VENTOLIN  HFA 108 (90 Base) MCG/ACT inhaler INHALE 2 PUFFS BY MOUTH EVERY 6 HOURS AS NEEDED FOR WHEEZING FOR SHORTNESS OF BREATH (Patient taking differently: Inhale 2 puffs into the lungs every 6 (six) hours as needed for wheezing or shortness of breath.)   [DISCONTINUED] rosuvastatin  (CRESTOR ) 40 MG tablet Take 1 tablet (40 mg total) by mouth at bedtime.    Physical Exam:    VS:  BP 108/60 (BP Location: Left Arm, Patient Position: Sitting, Cuff Size: Large)   Pulse 67   Ht 5' 4 (1.626 m)   Wt 196 lb (88.9 kg)   SpO2 94%   BMI 33.64 kg/m    Wt Readings from Last 3 Encounters:  02/25/24 196 lb (88.9 kg)  11/14/23 188 lb (85.3 kg)   10/25/23 188 lb (85.3 kg)    GEN: Well nourished, well developed in no acute distress NECK: No JVD; No carotid bruits CARDIAC: RRR, no murmurs, rubs, gallops RESPIRATORY:  Clear to auscultation without rales, wheezing or rhonchi  ABDOMEN: Soft, non-tender, non-distended EXTREMITIES:  No edema; No acute deformity     Asessement and Plan:.    Inferior STEMI/PFO: LHC on 10/03/2023 indicated heavy clot burden in the distal RCA RPDA/RPA V bifurcation with otherwise normal coronary arteries.  On further review by Dr. 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  Her Eliquis  was discontinued and she was started on aspirin  81 mg daily and continued on Plavix  75 mg daily. Today she reports that she  is doing well, notes an occasional chest discomfort that is not exertional and sound musculoskeletal in nature.  She denies any increased dyspnea on exertion, reports that her breathing is at baseline given history of COPD.  Reviewed ED precautions.  Continue aspirin  81 mg daily, Plavix  75 mg daily, Lasix  20 mg daily as needed, Crestor  4 mg daily and spironolactone 50 mg daily. Check CBC and CMET.   Hyperlipidemia: Last lipid profile on 10/03/2023 indicated total cholesterol 72, HDL 21, triglycerides 217 and LDL 8. Continue Crestor . Check CMET.   Type 2 DM: Last hemoglobin A1c 6.6% on 10/02/23.  Monitor managed per PCP.  COPD: Patient diagnosed with COPD stage III, followed by pulmonology at [facility].  Tobacco use: Patient reports she stopped smoking in May. Congratulated.    Disposition: F/u with Dr. Wonda or Rogina Schiano, NP in six months or sooner if needed.   Signed, Mars Scheaffer D [place], NP

## 2024-02-24 NOTE — Telephone Encounter (Signed)
 Tried calling and no answer. Will fax over a note requesting the following information:  How urgent this procedure is?   Due to the following reason: Patient had a STEMI on 10/02/2023 so she ideally needs to be on uninterrupted antiplatelet therapy for a full 12 months. If procedure is urgent, Dr. Ricardo said it would be reasonable to hold antiplatelet therapy after 6 months but it is safest to wait 12 months if not urgent.   Surgeons office can call [phone] and ask for the preop team.

## 2024-02-24 NOTE — Telephone Encounter (Signed)
 Pre-op covering team, can we find out how urgent this procedure is?  Patient had a STEMI on 10/02/2023 so she ideally needs to be on uninterrupted antiplatelet therapy for a full 12 months. If procedure is urgent, Dr. Ricardo said it would be reasonable to hold antiplatelet therapy after 6 months but it is safest to wait 12 months if not urgent.  Thank you!

## 2024-02-25 ENCOUNTER — Ambulatory Visit: Attending: Cardiology | Admitting: Cardiology

## 2024-02-25 ENCOUNTER — Other Ambulatory Visit: Payer: Self-pay

## 2024-02-25 ENCOUNTER — Encounter: Payer: Self-pay | Admitting: Cardiology

## 2024-02-25 VITALS — BP 108/60 | HR 67 | Ht 64.0 in | Wt 196.0 lb

## 2024-02-25 DIAGNOSIS — E1142 Type 2 diabetes mellitus with diabetic polyneuropathy: Secondary | ICD-10-CM

## 2024-02-25 DIAGNOSIS — E785 Hyperlipidemia, unspecified: Secondary | ICD-10-CM | POA: Diagnosis not present

## 2024-02-25 DIAGNOSIS — I25119 Atherosclerotic heart disease of native coronary artery with unspecified angina pectoris: Secondary | ICD-10-CM

## 2024-02-25 DIAGNOSIS — I213 ST elevation (STEMI) myocardial infarction of unspecified site: Secondary | ICD-10-CM

## 2024-02-25 DIAGNOSIS — Z720 Tobacco use: Secondary | ICD-10-CM

## 2024-02-25 DIAGNOSIS — I2510 Atherosclerotic heart disease of native coronary artery without angina pectoris: Secondary | ICD-10-CM | POA: Diagnosis not present

## 2024-02-25 DIAGNOSIS — Q2112 Patent foramen ovale: Secondary | ICD-10-CM

## 2024-02-25 DIAGNOSIS — J449 Chronic obstructive pulmonary disease, unspecified: Secondary | ICD-10-CM

## 2024-02-25 DIAGNOSIS — Z794 Long term (current) use of insulin: Secondary | ICD-10-CM

## 2024-02-25 LAB — CBC

## 2024-02-25 MED ORDER — ROSUVASTATIN CALCIUM 40 MG PO TABS: 40.0000 mg | ORAL_TABLET | Freq: Every day | ORAL | 3 refills | Status: AC

## 2024-02-25 NOTE — Telephone Encounter (Signed)
 I will reach out to the surgeon office again today as to inquire if the procedure is of urgent matter.   I will also fax these notes to the surgeon as well to review the notes from the preop APP.   I s/w Chemira jones, CMA, who tells me that she did d/w Dr. Ann the recommendations preferred from Dr. Wonda in regards to DAPT to wait 12 months if the procedure is not urgent. Per Chemira, pt not have any flare ups with HIDRADENITIS  at this time.   I did state to please let us  know if anything changes and becomes an urgent matter.

## 2024-02-25 NOTE — Patient Instructions (Signed)
 Thank you for choosing [facility]!     Medication Instructions:  The refill for your cholesterol medication has been sent to your pharmacy.  *If you need a refill on your cardiac medications before your next appointment, please call your pharmacy*   Lab Work: Labs will be drawn today If you have labs (blood work) drawn today and your tests are completely normal, you will receive your results only by: MyChart Message (if you have MyChart) OR A paper copy in the mail If you have any lab test that is abnormal or we need to change your treatment, we will call you to review the results.   Testing/Procedures: No procedures were ordered during today's visit.   Your next appointment:   6 month(s)   Provider:   [name], MD or Katlyn West     Follow-Up: At [facility], you and your health needs are our priority.  As part of our continuing mission to provide you with exceptional heart care, we have created designated Provider Care Teams.  These Care Teams include your primary Cardiologist (physician) and Advanced Practice Providers (APPs -  Physician Assistants and Nurse Practitioners) who all work together to provide you with the care you need, when you need it. We recommend signing up for the patient portal called MyChart.  Sign up information is provided on this After Visit Summary.  MyChart is used to connect with patients for Virtual Visits (Telemedicine).  Patients are able to view lab/test results, encounter notes, upcoming appointments, etc.  Non-urgent messages can be sent to your provider as well.   To learn more about what you can do with MyChart, go to [email or web address].

## 2024-02-26 LAB — HEPATIC FUNCTION PANEL
ALT: 9 IU/L (ref 0–32)
AST: 13 IU/L (ref 0–40)
Albumin: 4.2 g/dL (ref 3.8–4.9)
Alkaline Phosphatase: 81 IU/L (ref 49–135)
Bilirubin Total: 0.5 mg/dL (ref 0.0–1.2)
Bilirubin, Direct: 0.15 mg/dL (ref 0.00–0.40)
Total Protein: 6.6 g/dL (ref 6.0–8.5)

## 2024-02-26 LAB — LIPID PANEL
Chol/HDL Ratio: 6.4 ratio — ABNORMAL HIGH (ref 0.0–4.4)
Cholesterol, Total: 179 mg/dL (ref 100–199)
HDL: 28 mg/dL — ABNORMAL LOW (ref >39)
LDL Chol Calc (NIH): 95 mg/dL (ref 0–99)
Triglycerides: 333 mg/dL — ABNORMAL HIGH (ref 0–149)
VLDL Cholesterol Cal: 56 mg/dL — ABNORMAL HIGH (ref 5–40)

## 2024-02-26 LAB — COMPREHENSIVE METABOLIC PANEL WITH GFR
ALT: 9 IU/L (ref 0–32)
AST: 12 IU/L (ref 0–40)
Albumin: 4.3 g/dL (ref 3.8–4.9)
Alkaline Phosphatase: 83 IU/L (ref 49–135)
BUN/Creatinine Ratio: 15 (ref 9–23)
BUN: 19 mg/dL (ref 6–24)
Bilirubin Total: 0.5 mg/dL (ref 0.0–1.2)
CO2: 20 mmol/L (ref 20–29)
Calcium: 8.4 mg/dL — ABNORMAL LOW (ref 8.7–10.2)
Chloride: 97 mmol/L (ref 96–106)
Creatinine, Ser: 1.26 mg/dL — ABNORMAL HIGH (ref 0.57–1.00)
Globulin, Total: 2.4 g/dL (ref 1.5–4.5)
Glucose: 257 mg/dL — ABNORMAL HIGH (ref 70–99)
Potassium: 4.7 mmol/L (ref 3.5–5.2)
Sodium: 134 mmol/L (ref 134–144)
Total Protein: 6.7 g/dL (ref 6.0–8.5)
eGFR: 51 mL/min/1.73 — ABNORMAL LOW (ref >59)

## 2024-02-26 LAB — CBC
Hematocrit: 40.5 % (ref 34.0–46.6)
Hemoglobin: 13 g/dL (ref 11.1–15.9)
MCH: 28.3 pg (ref 26.6–33.0)
MCHC: 32.1 g/dL (ref 31.5–35.7)
MCV: 88 fL (ref 79–97)
Platelets: 264 x10E3/uL (ref 150–450)
RBC: 4.59 x10E6/uL (ref 3.77–5.28)
RDW: 14.9 % (ref 11.7–15.4)
WBC: 9 x10E3/uL (ref 3.4–10.8)

## 2024-02-27 ENCOUNTER — Ambulatory Visit: Payer: Self-pay | Admitting: Cardiology

## 2024-02-27 NOTE — Progress Notes (Signed)
The patient has been notified of the result and verbalized understanding.  All questions (if any) were answered.     

## 2024-09-21 ENCOUNTER — Ambulatory Visit: Admitting: Cardiology

## 2024-09-30 ENCOUNTER — Ambulatory Visit: Admitting: Dermatology
